# Patient Record
Sex: Female | Born: 1970 | Race: White | Hispanic: No | Marital: Married | Smoking: Former smoker
Health system: Southern US, Community
[De-identification: ages and names within clinical notes are randomized; demographics above are authoritative.]

## PROBLEM LIST (undated history)

## (undated) DIAGNOSIS — I639 Cerebral infarction, unspecified: Secondary | ICD-10-CM

## (undated) DIAGNOSIS — IMO0001 Reserved for inherently not codable concepts without codable children: Secondary | ICD-10-CM

## (undated) DIAGNOSIS — Z8669 Personal history of other diseases of the nervous system and sense organs: Secondary | ICD-10-CM

## (undated) HISTORY — PX: ENDOMETRIAL ABLATION: SHX621

## (undated) HISTORY — DX: Cerebral infarction, unspecified: I63.9

## (undated) HISTORY — PX: SHOULDER SURGERY: SHX246

## (undated) HISTORY — DX: Reserved for inherently not codable concepts without codable children: IMO0001

---

## 1999-09-18 ENCOUNTER — Ambulatory Visit (HOSPITAL_COMMUNITY): Admission: RE | Admit: 1999-09-18 | Discharge: 1999-09-18 | Payer: Self-pay | Admitting: *Deleted

## 1999-11-25 ENCOUNTER — Emergency Department (HOSPITAL_COMMUNITY): Admission: EM | Admit: 1999-11-25 | Discharge: 1999-11-25 | Payer: Self-pay | Admitting: Emergency Medicine

## 2000-11-05 ENCOUNTER — Emergency Department (HOSPITAL_COMMUNITY): Admission: EM | Admit: 2000-11-05 | Discharge: 2000-11-05 | Payer: Self-pay | Admitting: Emergency Medicine

## 2000-11-11 ENCOUNTER — Encounter: Admission: RE | Admit: 2000-11-11 | Discharge: 2000-11-30 | Payer: Self-pay | Admitting: Family Medicine

## 2000-12-26 ENCOUNTER — Other Ambulatory Visit: Admission: RE | Admit: 2000-12-26 | Discharge: 2000-12-26 | Payer: Self-pay | Admitting: Obstetrics and Gynecology

## 2002-11-29 ENCOUNTER — Other Ambulatory Visit: Admission: RE | Admit: 2002-11-29 | Discharge: 2002-11-29 | Payer: Self-pay | Admitting: Obstetrics and Gynecology

## 2003-01-09 ENCOUNTER — Encounter: Payer: Self-pay | Admitting: Obstetrics and Gynecology

## 2003-01-09 ENCOUNTER — Ambulatory Visit (HOSPITAL_COMMUNITY): Admission: RE | Admit: 2003-01-09 | Discharge: 2003-01-09 | Payer: Self-pay | Admitting: Obstetrics and Gynecology

## 2003-02-06 ENCOUNTER — Encounter: Payer: Self-pay | Admitting: Obstetrics and Gynecology

## 2003-02-06 ENCOUNTER — Ambulatory Visit (HOSPITAL_COMMUNITY): Admission: RE | Admit: 2003-02-06 | Discharge: 2003-02-06 | Payer: Self-pay | Admitting: Obstetrics and Gynecology

## 2003-03-04 ENCOUNTER — Inpatient Hospital Stay (HOSPITAL_COMMUNITY): Admission: AD | Admit: 2003-03-04 | Discharge: 2003-03-04 | Payer: Self-pay | Admitting: Obstetrics and Gynecology

## 2003-03-19 ENCOUNTER — Inpatient Hospital Stay (HOSPITAL_COMMUNITY): Admission: AD | Admit: 2003-03-19 | Discharge: 2003-03-19 | Payer: Self-pay | Admitting: Obstetrics and Gynecology

## 2003-05-26 ENCOUNTER — Encounter (INDEPENDENT_AMBULATORY_CARE_PROVIDER_SITE_OTHER): Payer: Self-pay | Admitting: Specialist

## 2003-05-26 ENCOUNTER — Inpatient Hospital Stay (HOSPITAL_COMMUNITY): Admission: AD | Admit: 2003-05-26 | Discharge: 2003-05-29 | Payer: Self-pay | Admitting: Obstetrics and Gynecology

## 2003-05-30 ENCOUNTER — Encounter: Admission: RE | Admit: 2003-05-30 | Discharge: 2003-06-29 | Payer: Self-pay | Admitting: Obstetrics and Gynecology

## 2003-09-13 ENCOUNTER — Encounter: Admission: RE | Admit: 2003-09-13 | Discharge: 2003-09-13 | Payer: Self-pay | Admitting: Otolaryngology

## 2003-09-13 IMAGING — US US SOFT TISSUE HEAD/NECK
1 series · 14 of 25 positions shown · non-contrast
Comparison: none

CLINICAL DATA: Enlarged gland on physical exam. 
ULTRASOUND OF THE THYROID
The right lobe measures 6.2 x 2.3 x 2.3 cm.  The left lobe is 6.2 x 2.1 x 2.0 cm.  The isthmus is 0.9 cm in thickness.

[Series 1: unknown · 0.08mm/px · 14 of 33 slices shown]
[im 1/33]
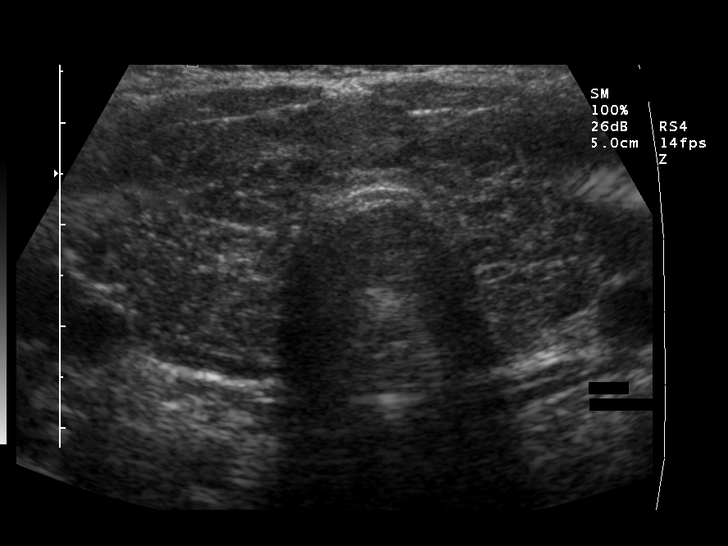
[im 3/33]
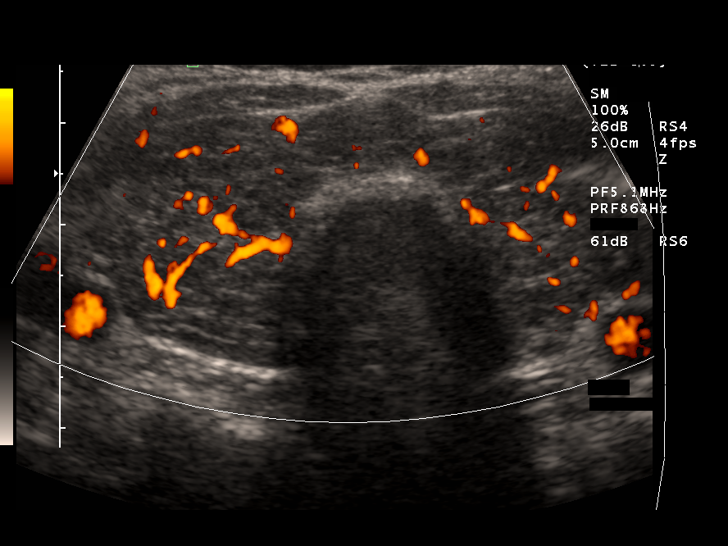
[im 6/33]
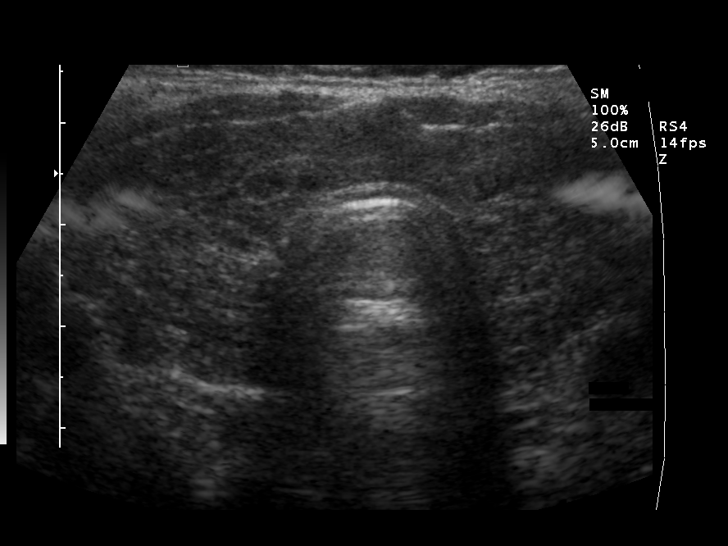
[im 9/33]
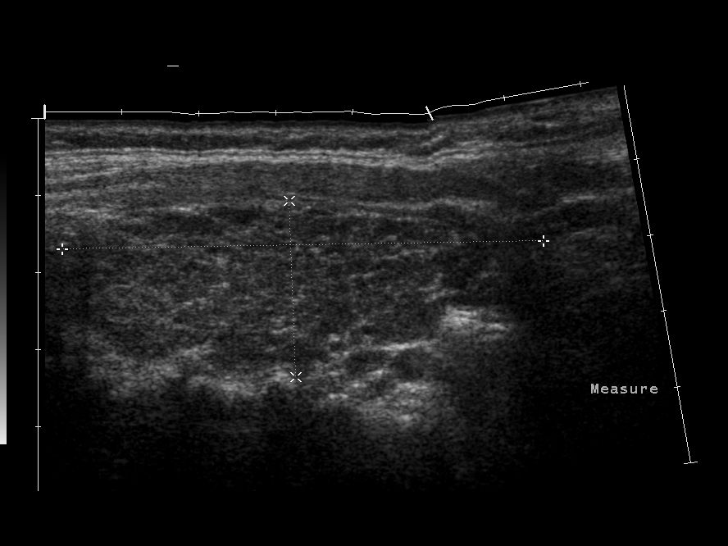
[im 11/33]
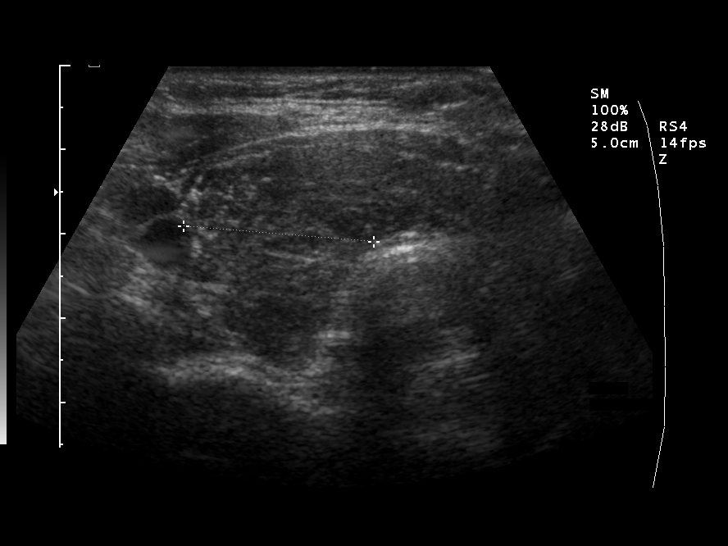
[im 13/33]
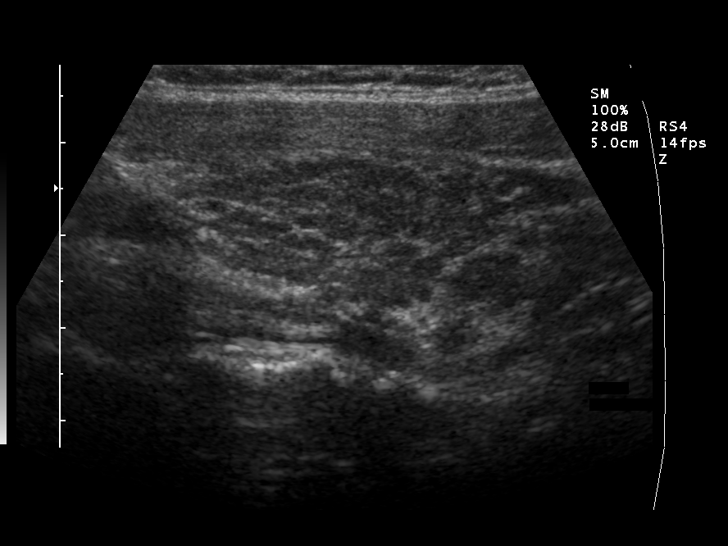
[im 15/33]
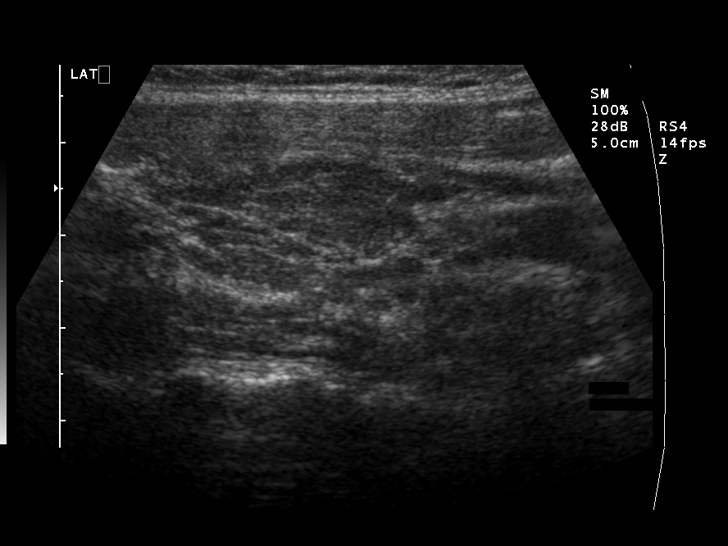
[im 18/33]
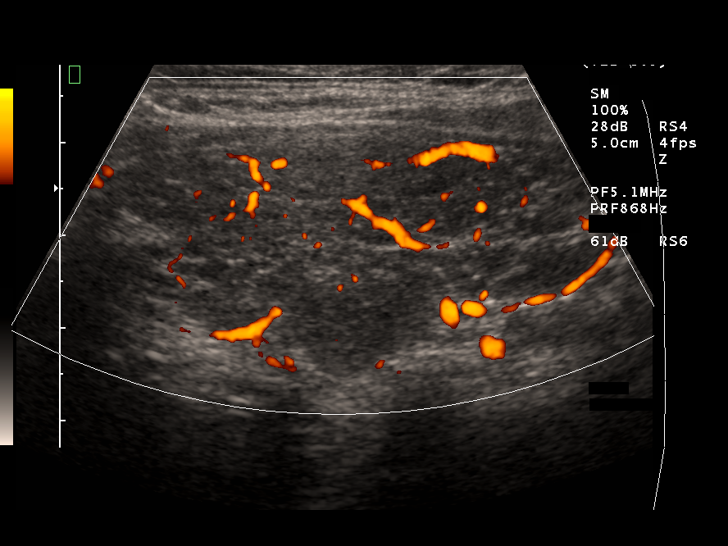
[im 21/33]
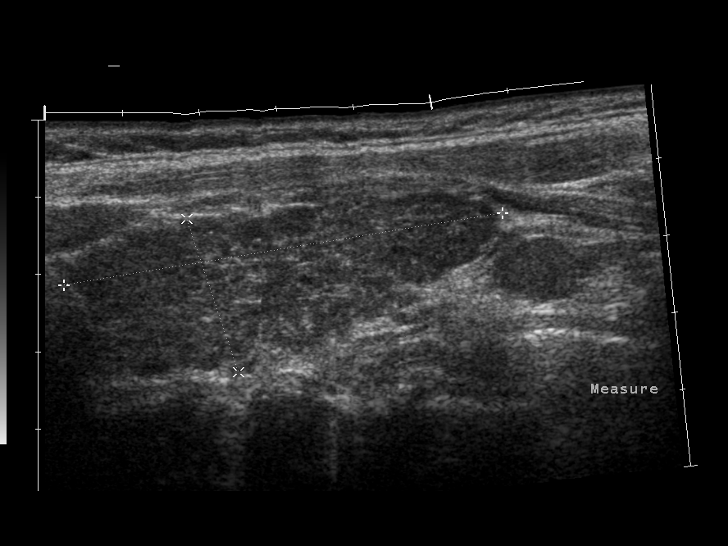
[im 22/33]
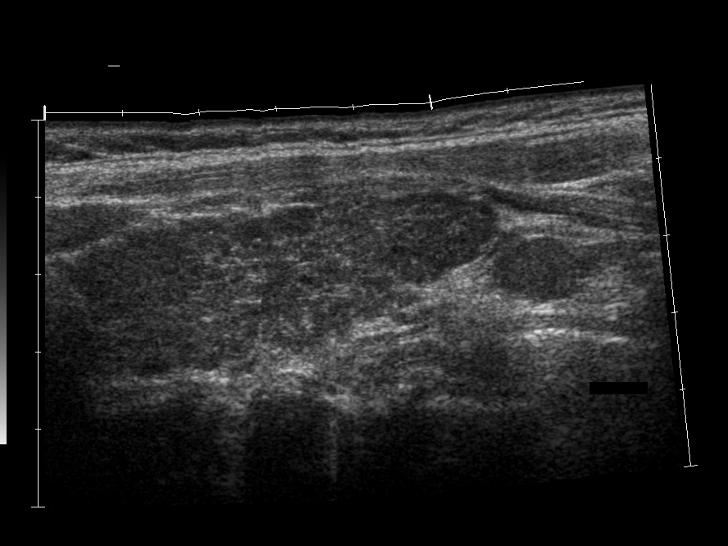
[im 25/33]
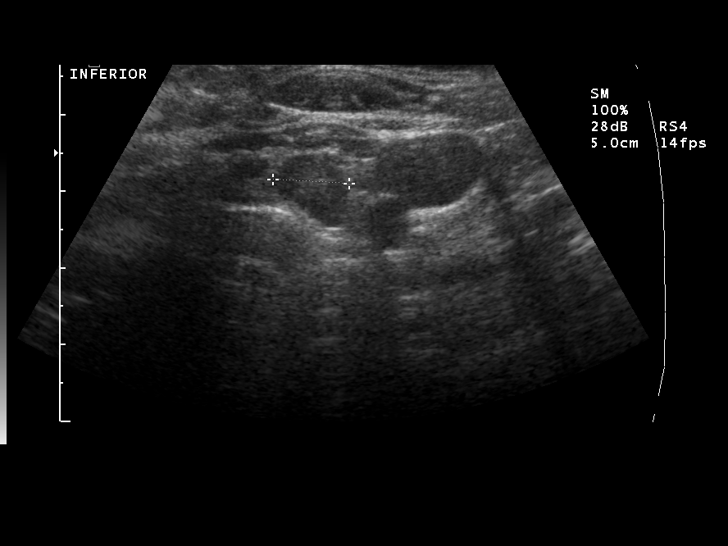
[im 27/33]
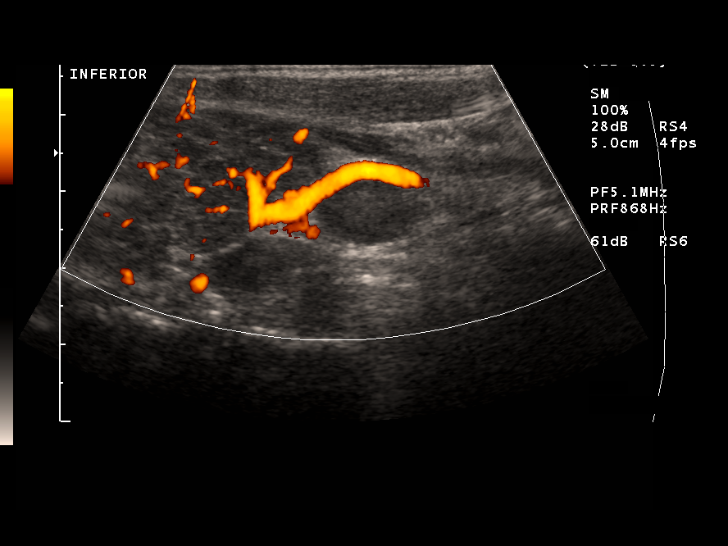
[im 30/33]
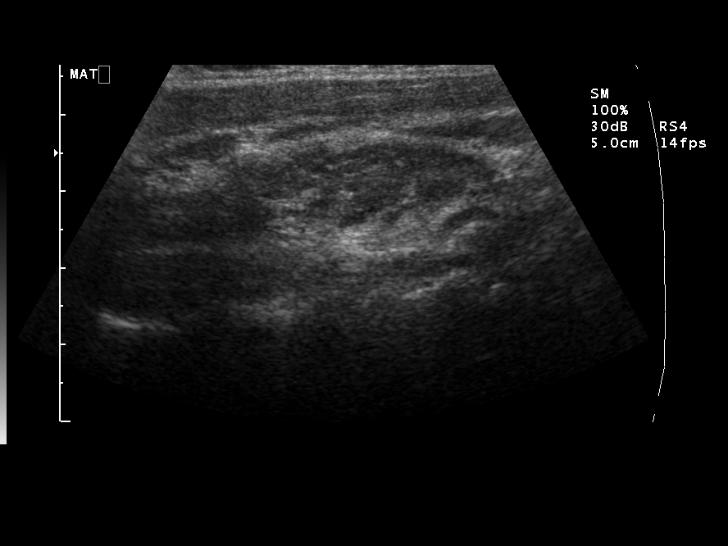
[im 33/33]
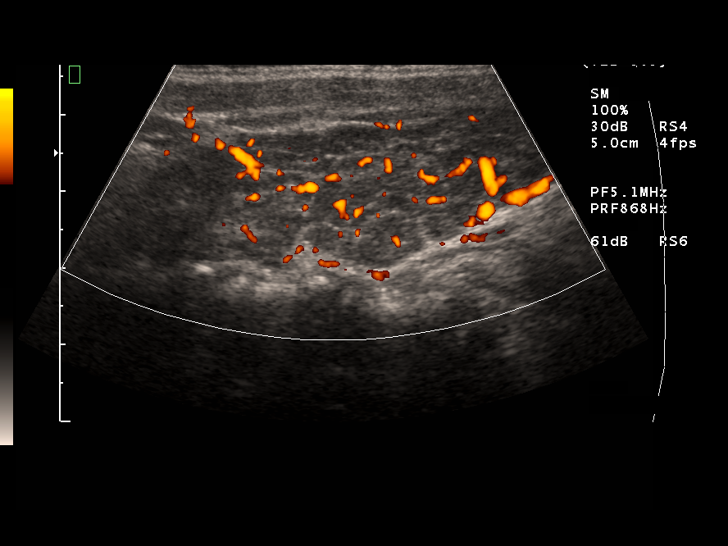

[14 of 25 positions shown; findings below may reference images not displayed]

The gland is diffusely inhomogeneous, but there are no discrete nodules or cysts.  

Just below the inferior aspect of the left lobe, there is an PIKIR shaped soft tissue density in the soft tissues measuring 14 x 8 x 10 mm.  This has the appearance of a lymph node.  It is borderline enlarged.  This should be correlated clinically.  One might consider a follow-up study in three to six months to make sure this is enlarging .  
IMPRESSION
1.  Moderately enlarged thyroid gland showing diffuse inhomogeneity, without discrete nodules or cysts.   
2.  Incidentally noted is a borderline enlarged lymph node in the left neck, just below the left lobe of the thyroid.  See report.

## 2004-07-09 ENCOUNTER — Other Ambulatory Visit: Admission: RE | Admit: 2004-07-09 | Discharge: 2004-07-09 | Payer: Self-pay | Admitting: Obstetrics and Gynecology

## 2004-07-16 ENCOUNTER — Ambulatory Visit: Payer: Self-pay | Admitting: Family Medicine

## 2004-07-21 ENCOUNTER — Ambulatory Visit (HOSPITAL_COMMUNITY): Admission: RE | Admit: 2004-07-21 | Discharge: 2004-07-21 | Payer: Self-pay | Admitting: Obstetrics and Gynecology

## 2004-07-21 IMAGING — US US TRANSVAGINAL NON-OB
1 series · 14 of 25 positions shown · non-contrast
Comparison: none

CLINICAL DATA: The patient has right lower quadrant pain.
 ULTRASOUND OF THE PELVIS  - TRANSABDOMINAL AND TRANSVAGINAL:
 The transabdominal study reveals the uterus to measure 6.8 x 4.0 x 5.5 cm.  The uterus is thought to be slightly retroverted.  
 The transvaginal study reveals the endometrial stripe to measure 6.6 mm which is thought to be within normal limits.  
 The right ovary measures 3.7 x 2.1 x 2.6 cm with numerous follicles, the largest of which measures 1.1 x 0.93 x 0.94 cm.  The left ovary measures 2.5 x 1.4 x 1.7 cm again with numerous follicles with the largest measuring up to 0.72 x 0.69 x 0.79 cm.  No free fluid.

[Series 1: unknown · 0.33mm/px · 14 of 54 slices shown]
[im 1/54]
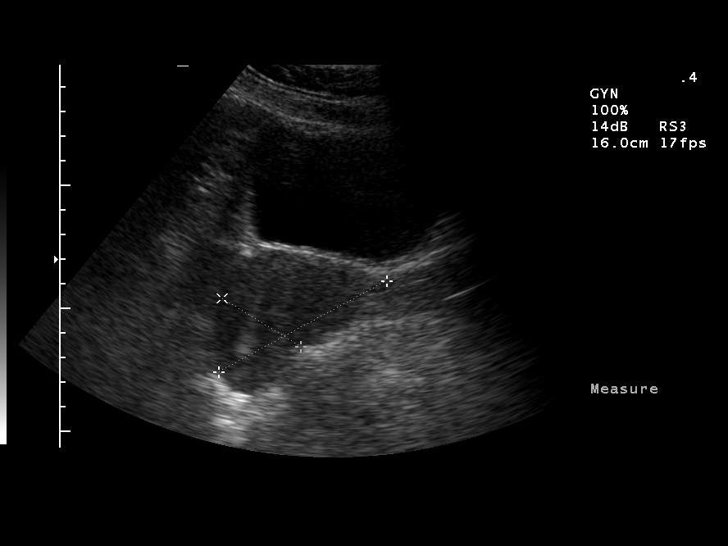
[im 5/54]
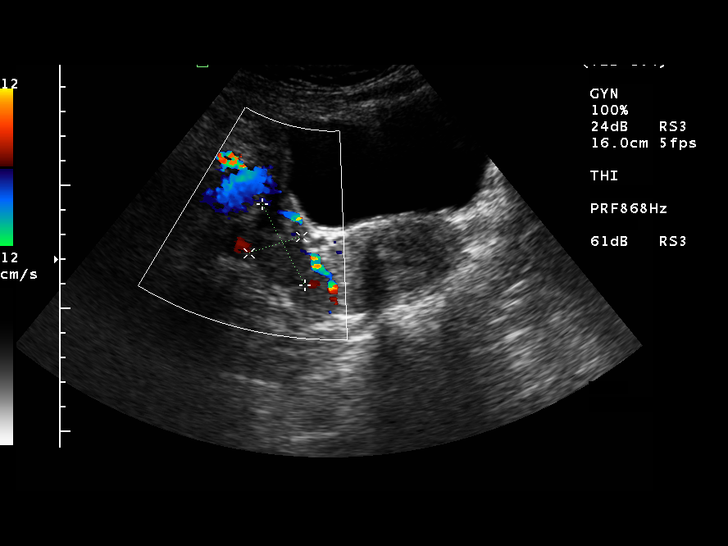
[im 9/54]
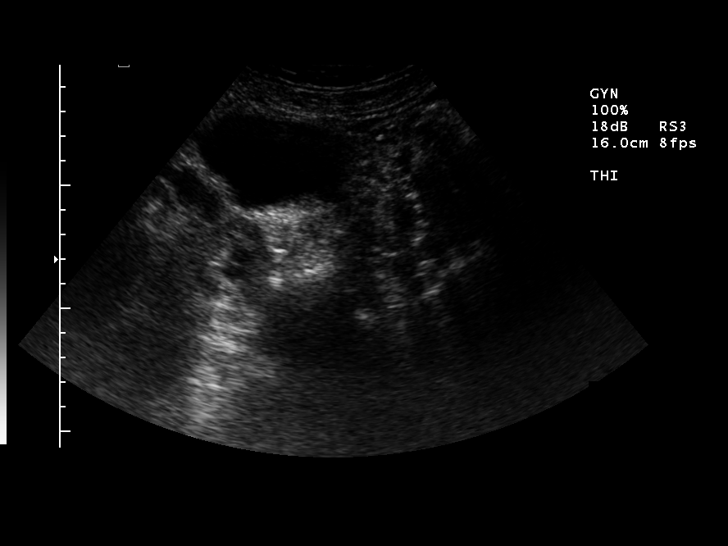
[im 14/54]
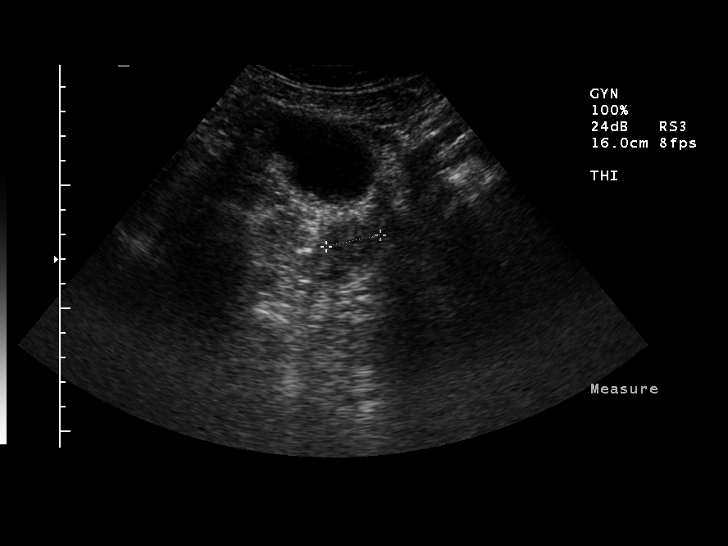
[im 18/54]
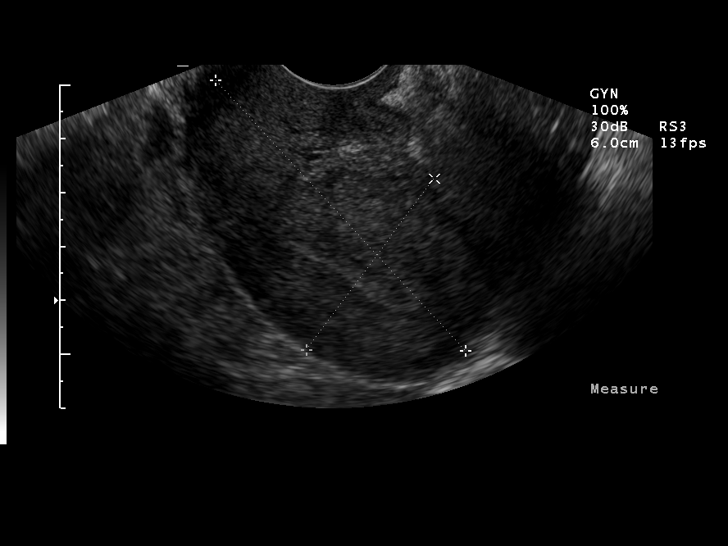
[im 20/54]
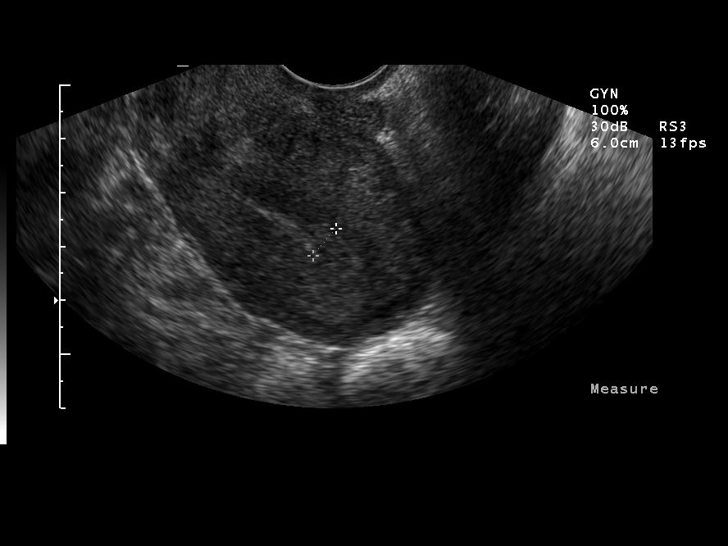
[im 25/54]
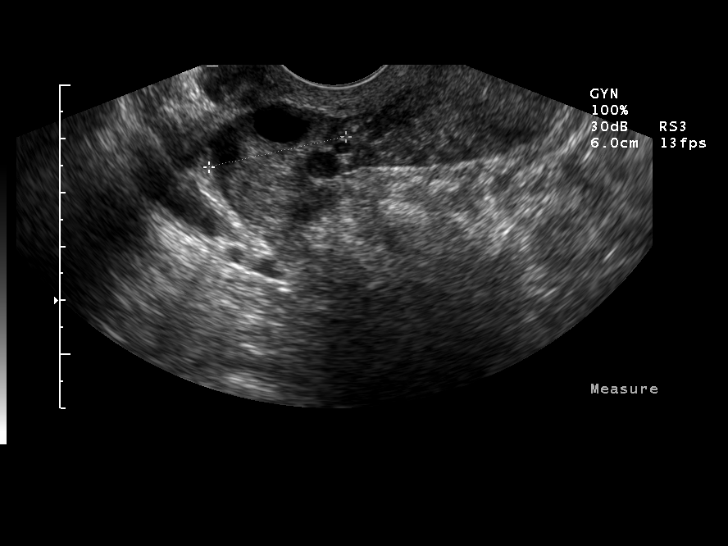
[im 29/54]
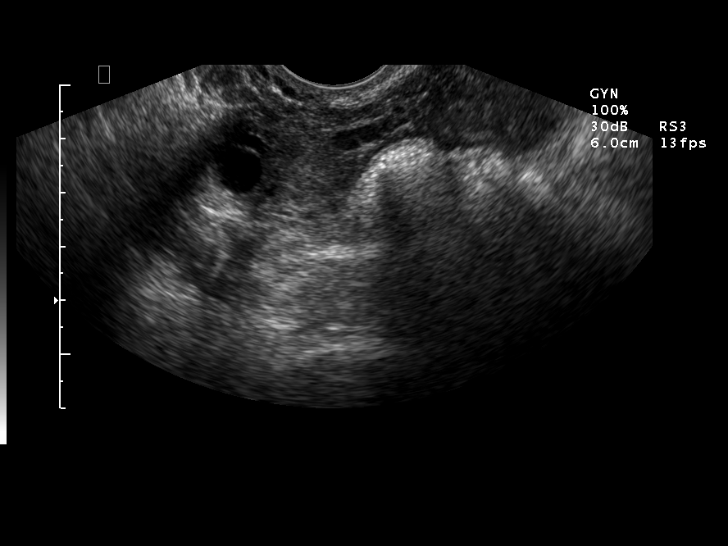
[im 34/54]
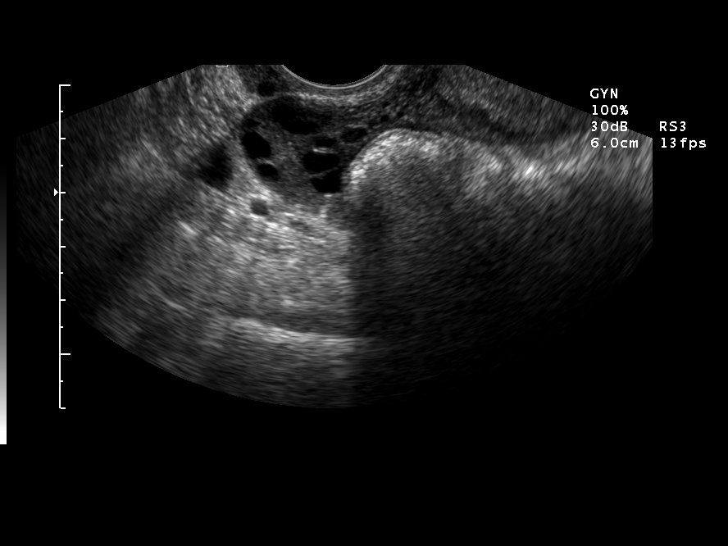
[im 36/54]
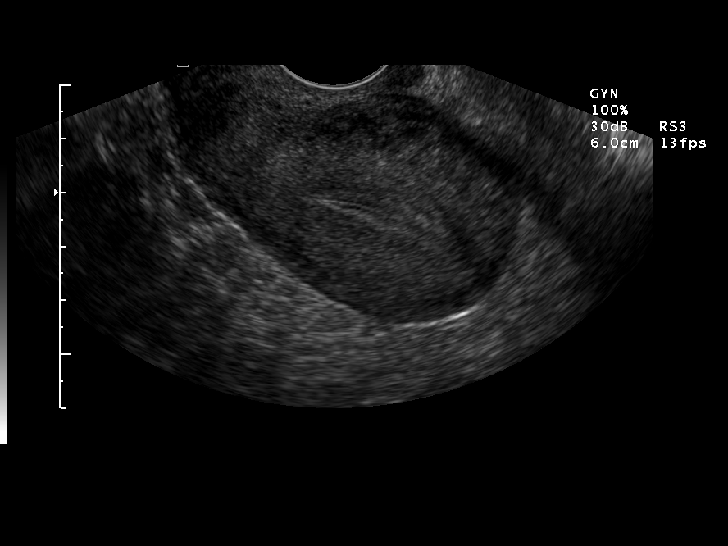
[im 40/54]
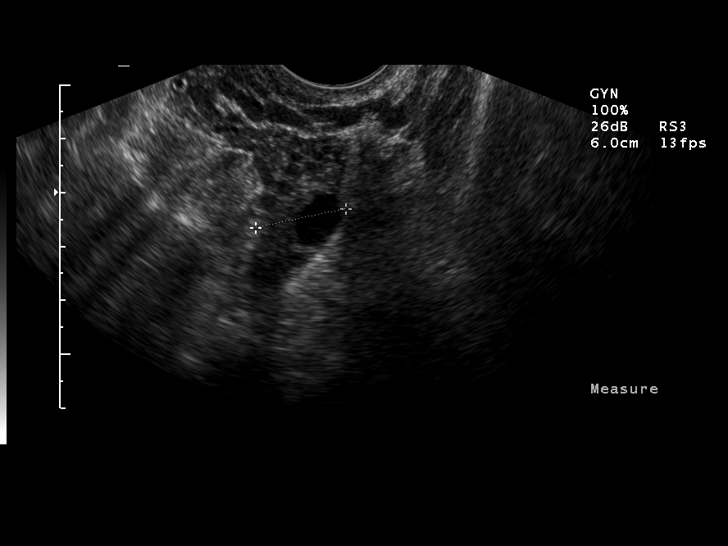
[im 45/54]
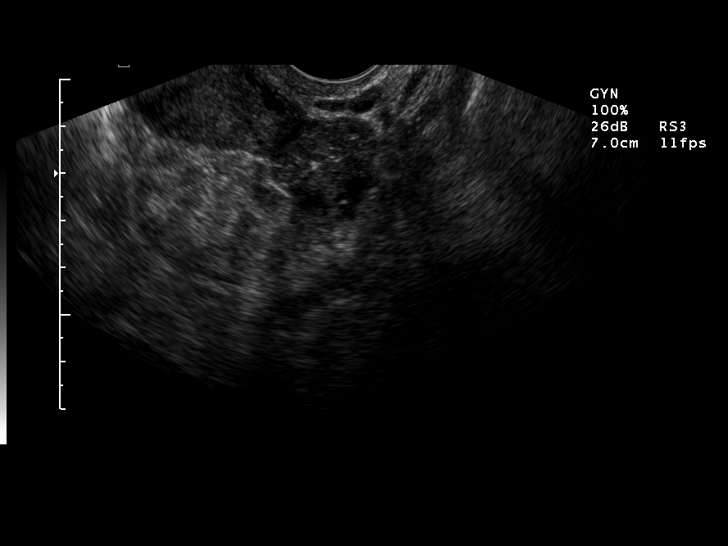
[im 49/54]
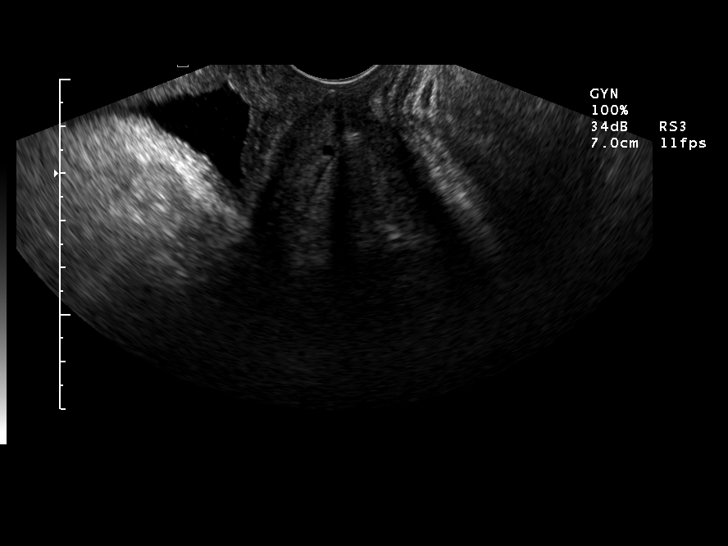
[im 54/54]
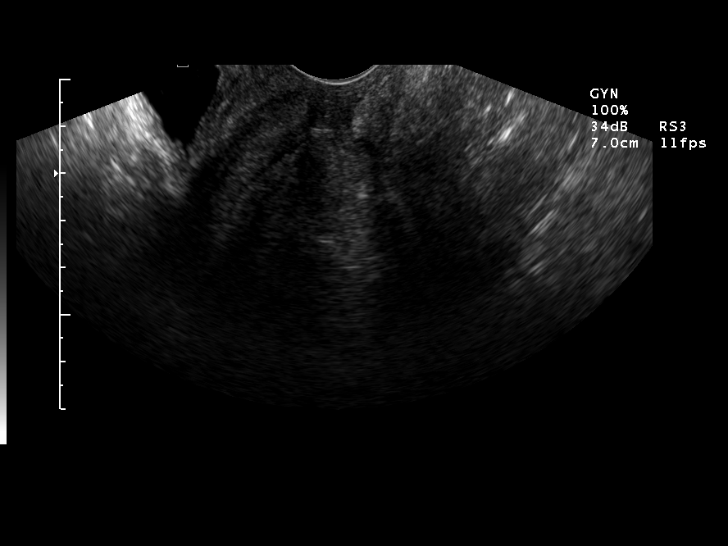

[14 of 25 positions shown; findings below may reference images not displayed]

IMPRESSION: The uterus is mildly retroverted.  
 Bilateral ovarian follicles with a cyst in the right ovary measuring up to 1.1 cm and in the left ovary measuring up to 0.8 cm.

## 2005-03-17 ENCOUNTER — Encounter: Admission: RE | Admit: 2005-03-17 | Discharge: 2005-03-17 | Payer: Self-pay | Admitting: Endocrinology

## 2005-03-17 IMAGING — US US SOFT TISSUE HEAD/NECK
1 series · 14 of 25 positions shown · non-contrast
Comparison: [DATE].

CLINICAL DATA: Hyperthyroidism.
 THYROID ULTRASOUND:
TECHNIQUE: Ultrasound examination of the thyroid gland and adjacent soft tissue structures was performed.

[Series 1: unknown · 0.08mm/px · 14 of 38 slices shown]
[im 1/38]
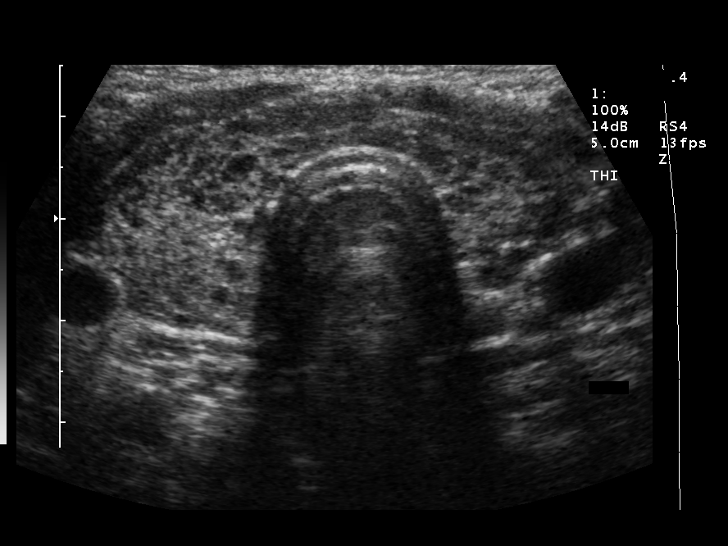
[im 4/38]
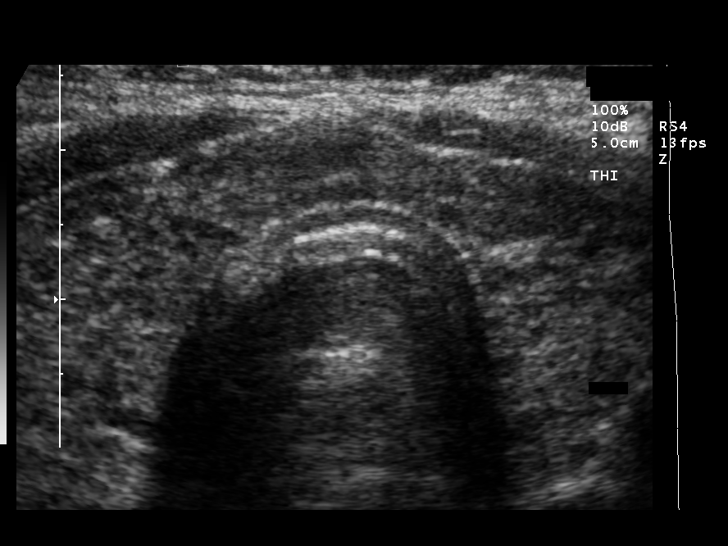
[im 7/38]
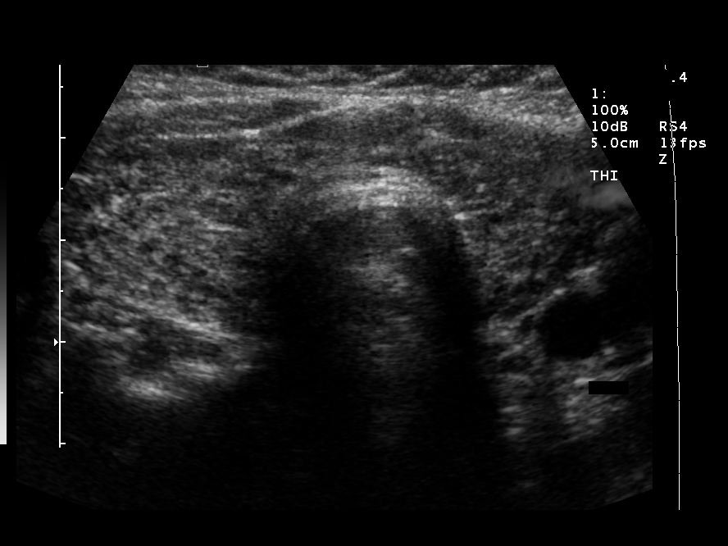
[im 10/38]
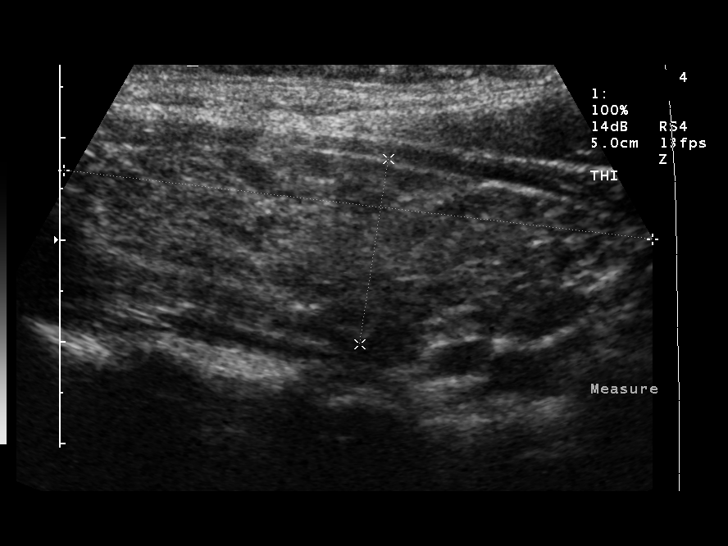
[im 13/38]
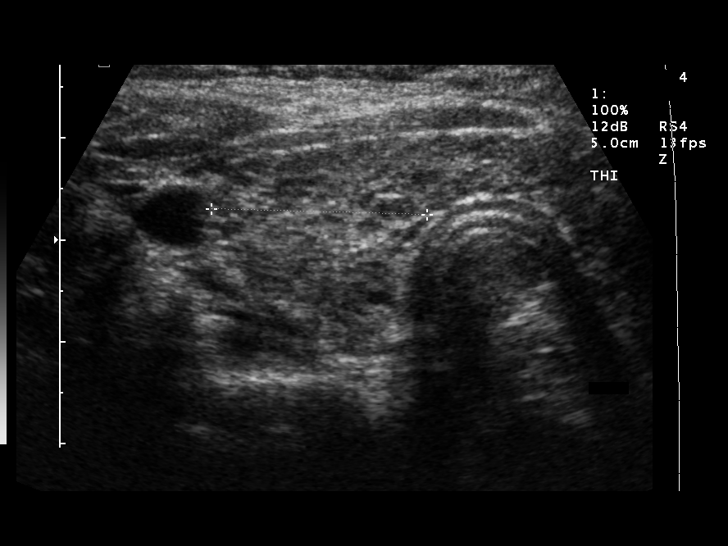
[im 14/38]
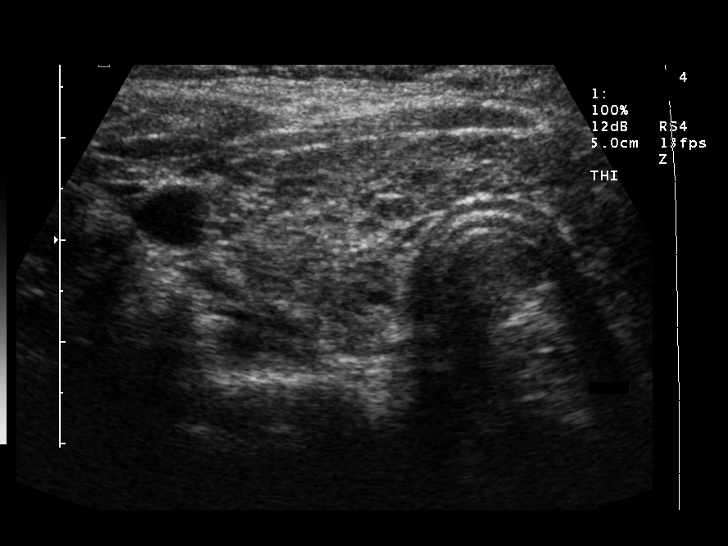
[im 17/38]
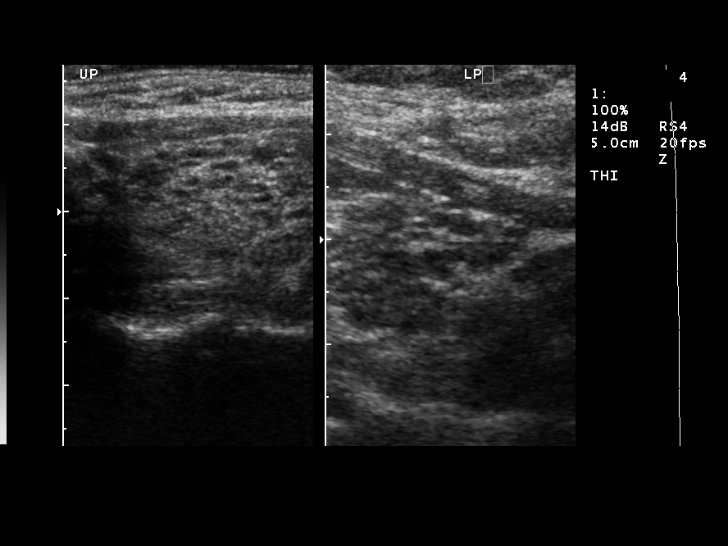
[im 21/38]
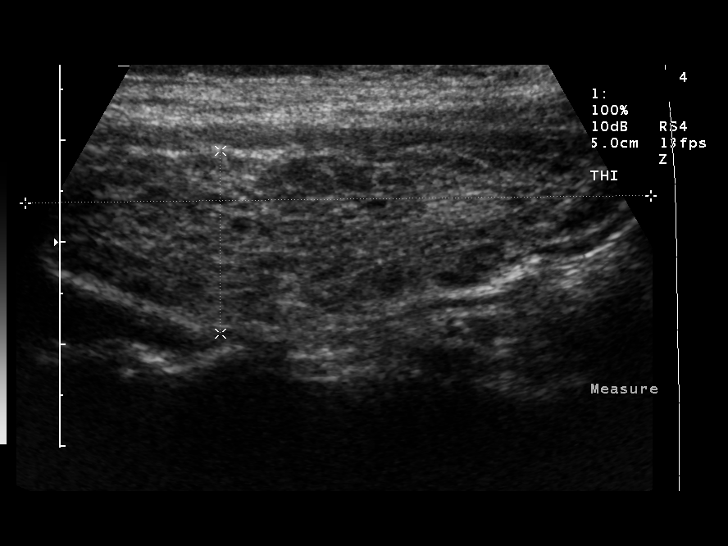
[im 24/38]
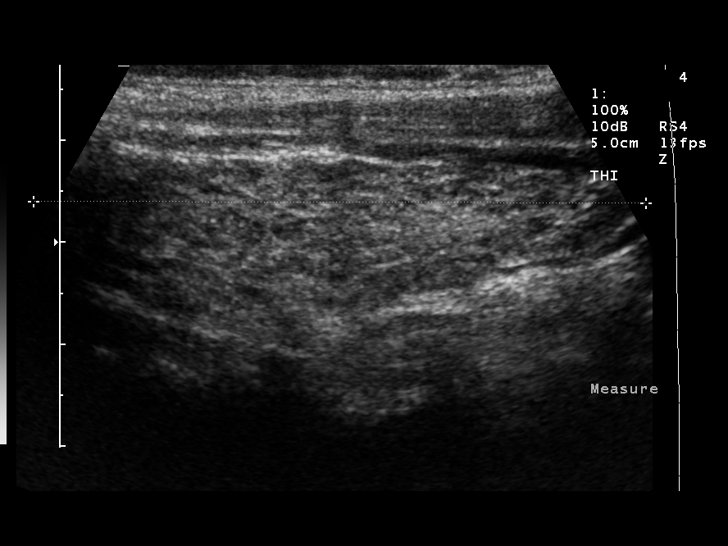
[im 25/38]
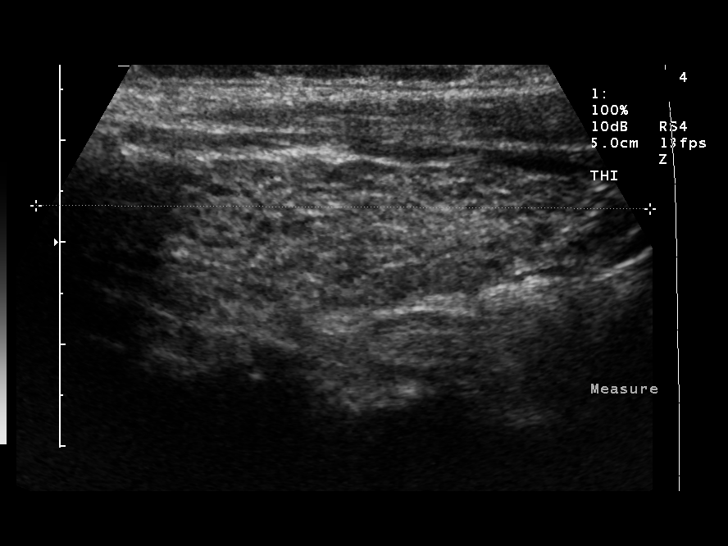
[im 28/38]
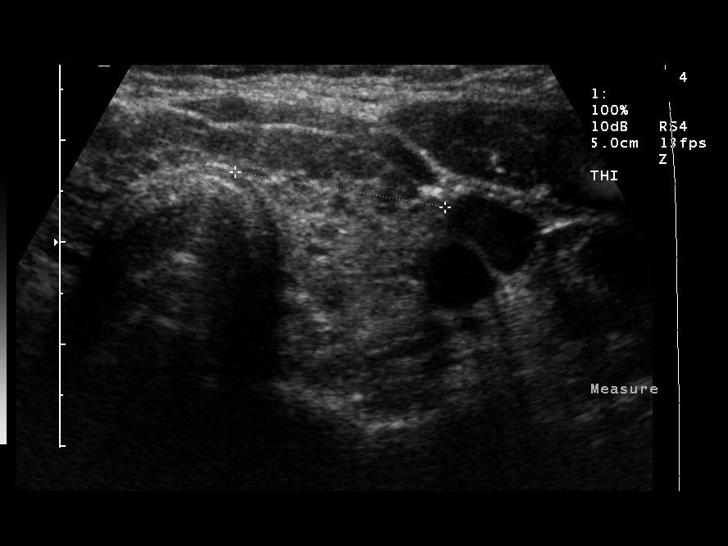
[im 31/38]
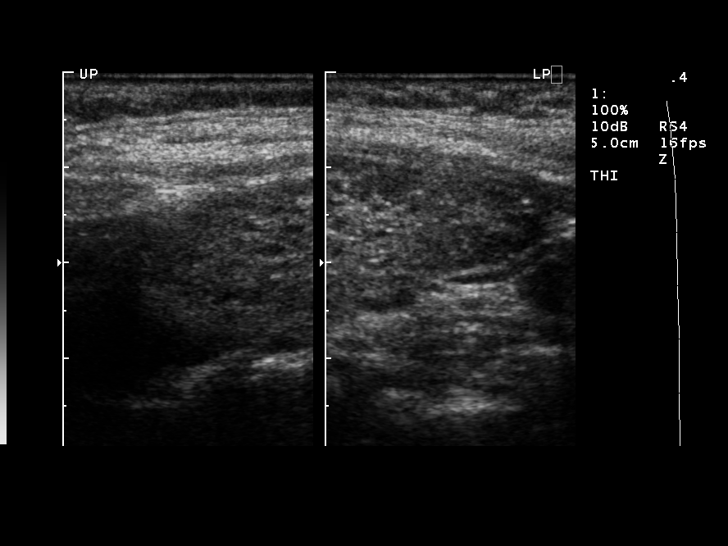
[im 34/38]
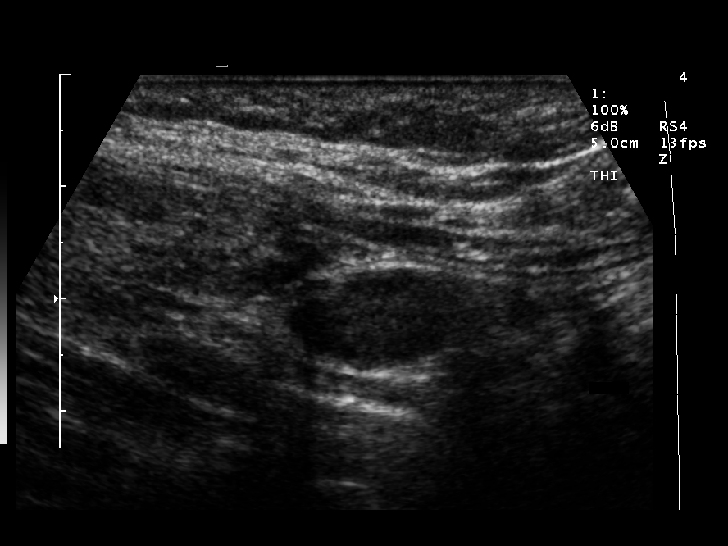
[im 38/38]
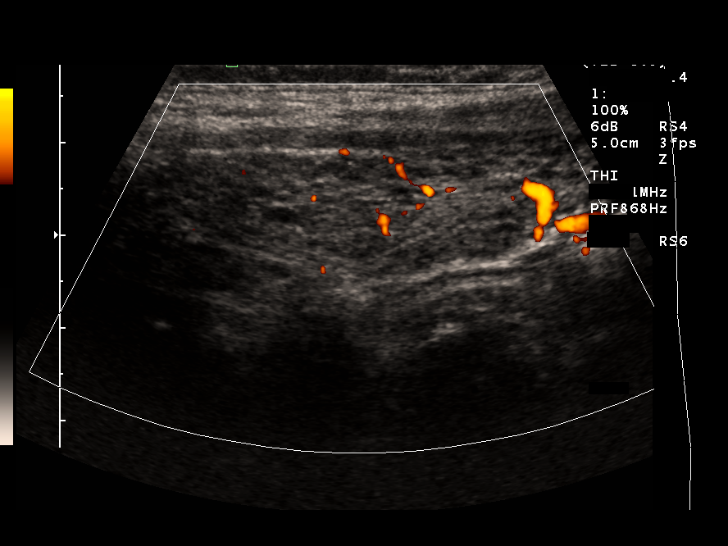

[14 of 25 positions shown; findings below may reference images not displayed]

FINDINGS: Right lobe at 5.8 x 1.8 x 2.1 cm.  Left lobe at 5.9 x 1.8 x 2.0 cm.   Isthmus at 6 mm.  These measurements are similar to on the prior.  Vascularity is again increased.  Echogenicity is heterogeneous.  No focal dominant lesion is identified.  Again identified in the soft tissues inferior to the left lower lobe thyroid is a 1.6 x 0.9 x 0.8 cm hypoechoic, well circumscribed lesion.  This is adjacent to the carotid and jugular and is overall similar to on the prior exam.  This likely represents a lymph node.
IMPRESSION: 1.  Similar appearance of thyromegaly and heterogeneous thyroid echotexture.  No dominant lesion identified.
 2.  Similar appearance of mildly prominent left extrathyroidal soft tissue nodule, likely representing a lymph node.

## 2005-04-26 ENCOUNTER — Encounter: Admission: RE | Admit: 2005-04-26 | Discharge: 2005-04-26 | Payer: Self-pay | Admitting: Endocrinology

## 2005-04-26 IMAGING — US US SOFT TISSUE HEAD/NECK
1 series · 13 of 25 positions shown · non-contrast
Comparison: Previous study of [DATE], [REDACTED].

CLINICAL DATA: Followup thyroid and lymph nodes of the neck.  
THYROID ULTRASOUND:
TECHNIQUE: Ultrasound examination of the thyroid gland and adjacent soft tissue structures was performed.

[Series 1: unknown · 0.07mm/px · 13 of 51 slices shown]
[im 1/51]
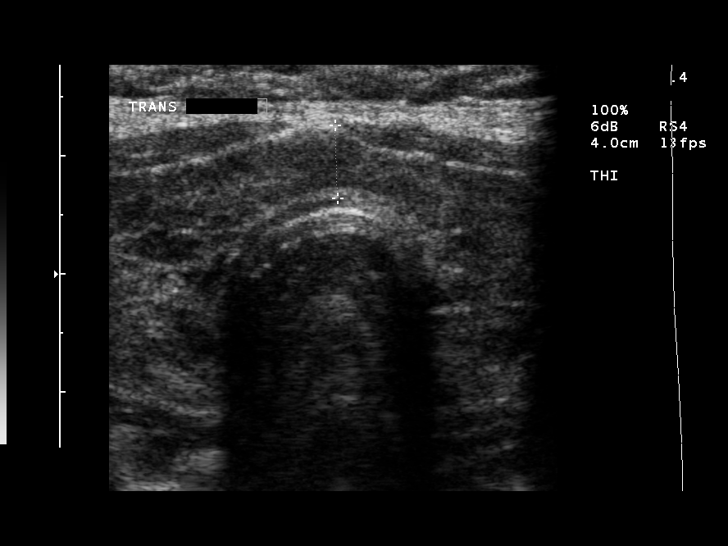
[im 5/51]
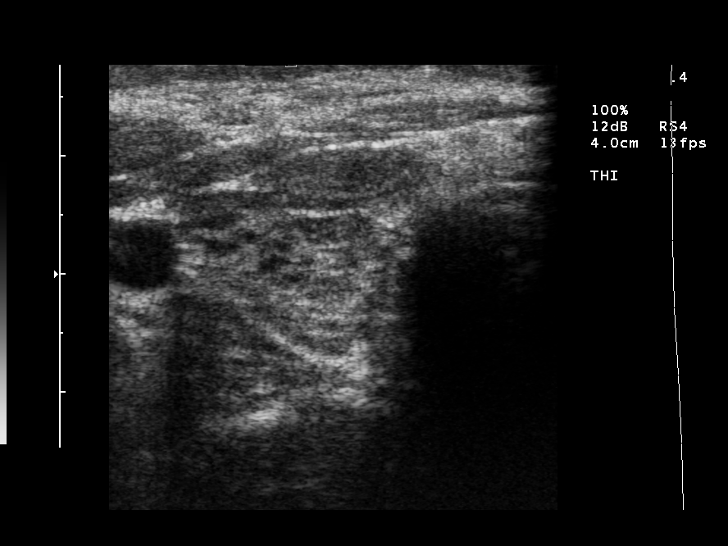
[im 9/51]
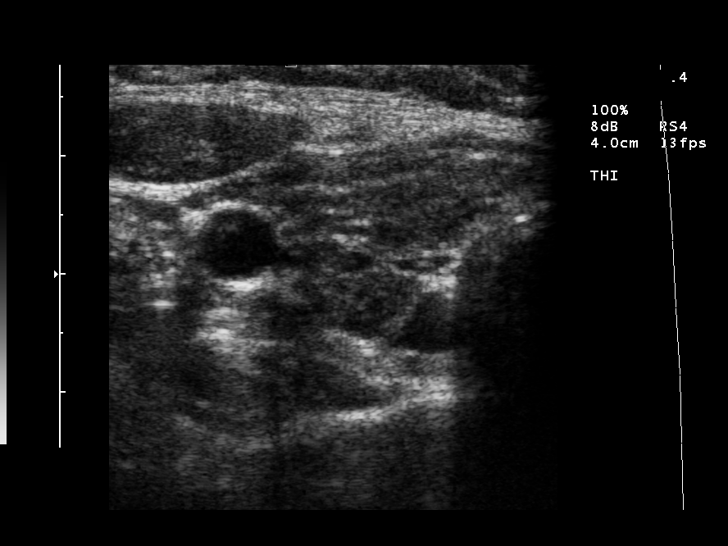
[im 13/51]
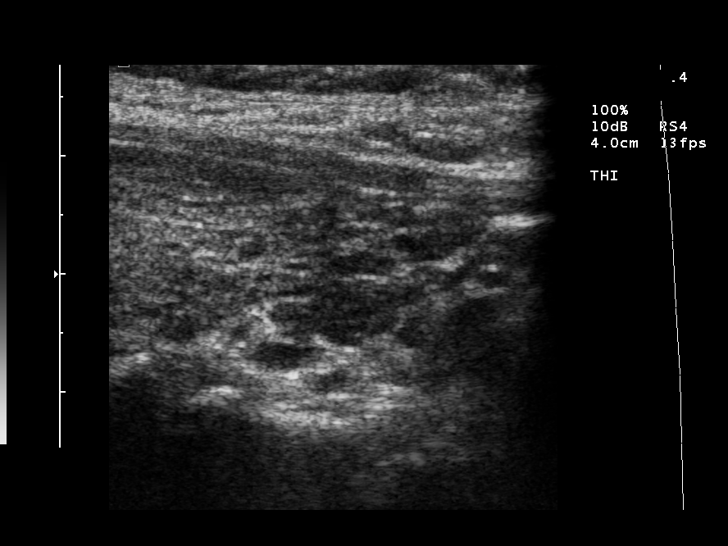
[im 17/51]
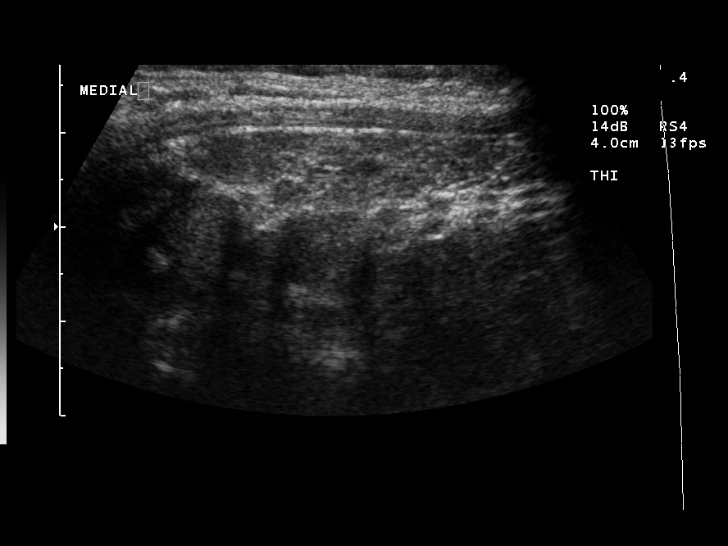
[im 21/51]
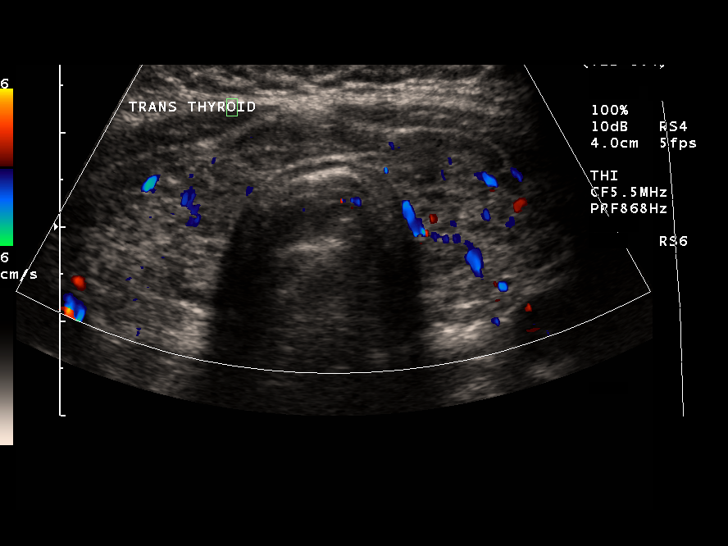
[im 26/51]
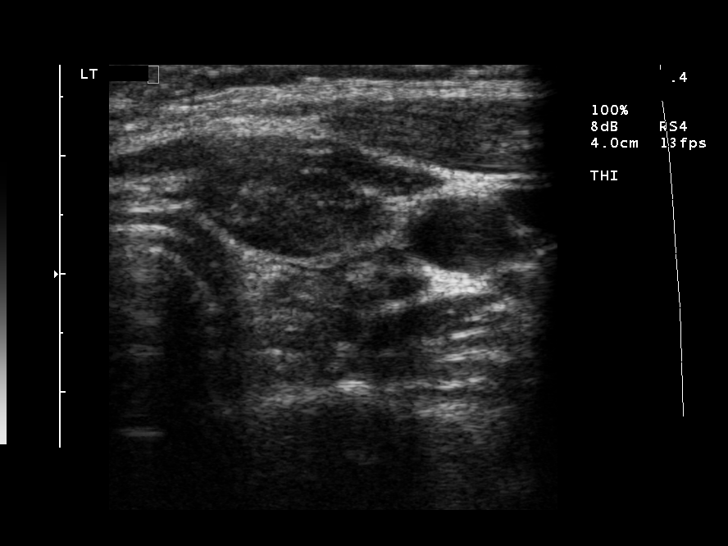
[im 30/51]
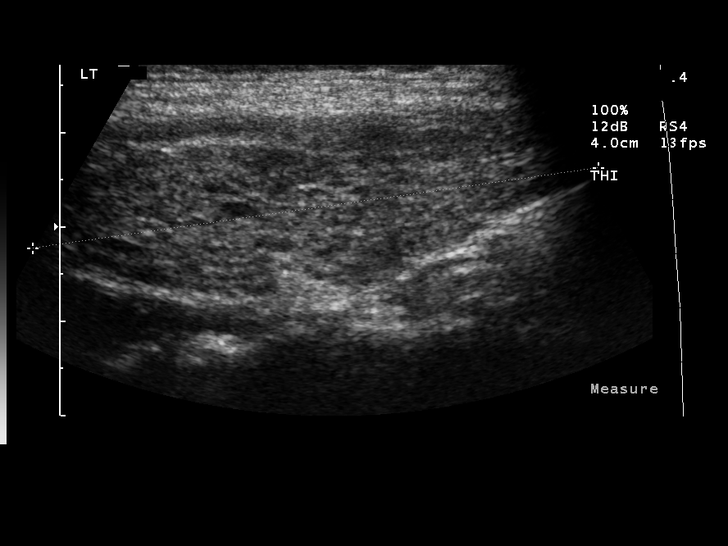
[im 34/51]
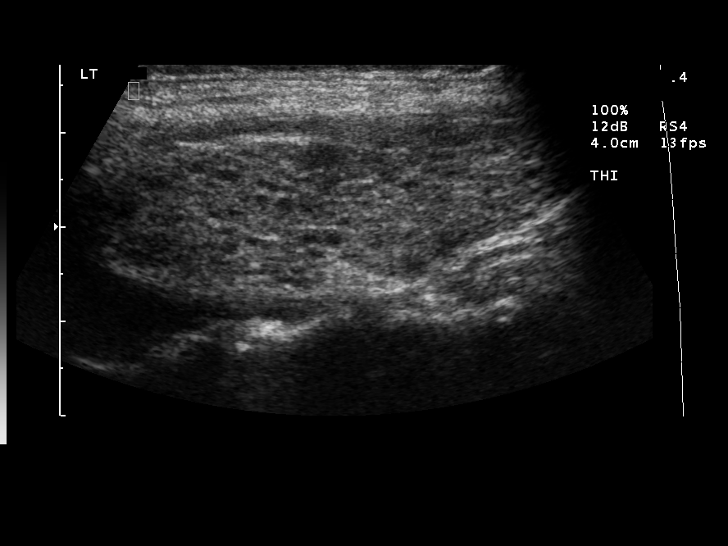
[im 38/51]
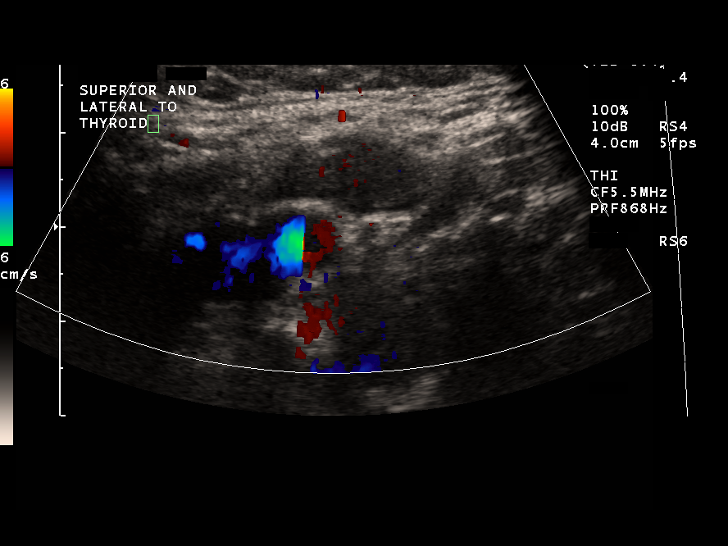
[im 42/51]
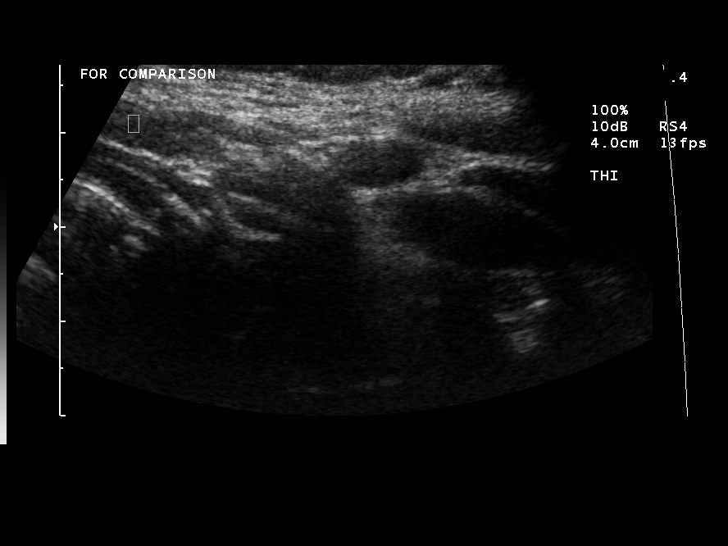
[im 46/51]
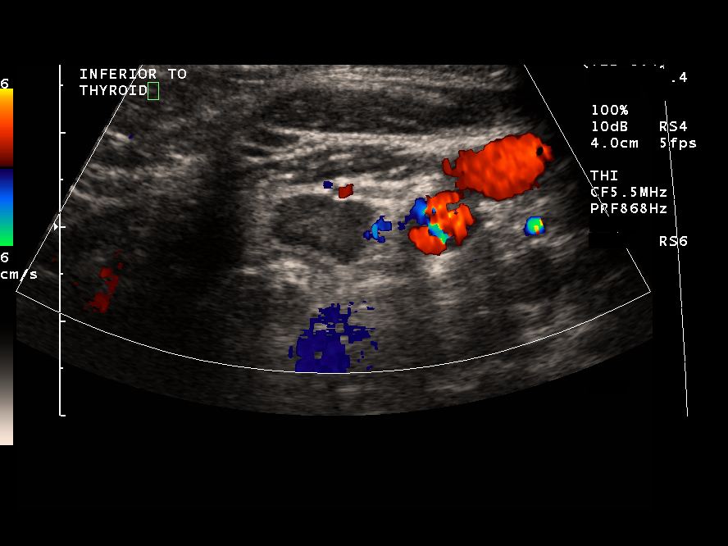
[im 51/51]
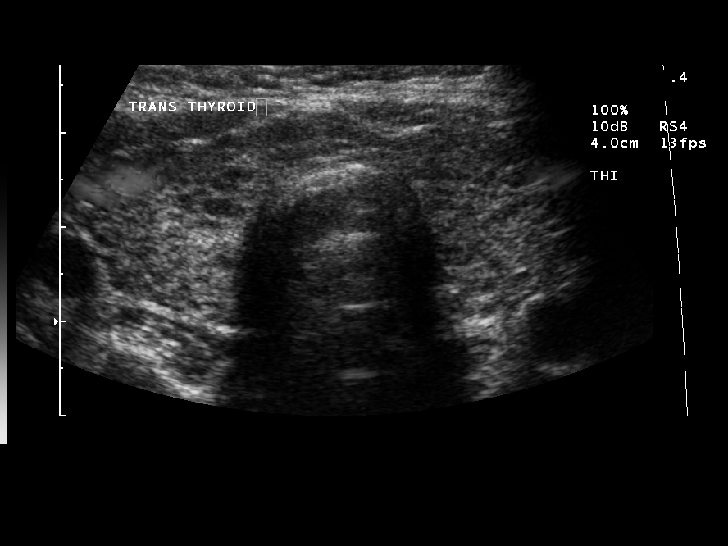

[13 of 25 positions shown; findings below may reference images not displayed]

FINDINGS: Multiple scans of the neck are made and are compared to the previous study and show the right lobe to be 5.8 x 1.5 x 2.3 cm.  The left lobe measures 6.0 x 1.7 x 2.1 cm.  Isthmus of the thyroid is 6 mm.  No focal cystic or solid masses are seen within the thyroid.  There has been no significant interval change in the thyroid lobes since the previous study.  There are two lymph nodes inferior to the thyroid.   The one on the right measures 5 x 7 x 10 mm and the one on the left measures 8 x 9 x 13 mm.  There is another palpable area at the right neck superior and lateral to the thyroid which appears to be a lymph node and measures 7 x 19 x 2 mm and a fourth superior to the thyroid of the left neck which measures 5 x 9 x 15 mm which also appears to be a lymph node.  Largest previous node was in the inferior aspect of the left lower lobe and measures 8 x 9 x 16 mm.  The largest node now seen is  superior and lateral to the thyroid on the right side which measures 7 x 19 x 20 mm may have not been checked on the previous thyroid studies due to it's position.
IMPRESSION: The thyroid itself has not changed.  There are now visualized four lymph nodes as noted above.  The largest is superior and lateral to the right thyroid and measures 7 x 19 x 20 mm but this area may not have been imaged on the previous studies.  There are smaller nodes in a similar position in the left neck but measure only 5 x 9 x 15 mm and there are also nodes inferior to both right and left lobes of the thyroid that now measure 8 x 9 x 13 mm on the left and on the right 5 x 7 x 10 mm.  All of these nodes could be reactive in nature.

## 2005-04-28 ENCOUNTER — Other Ambulatory Visit: Admission: RE | Admit: 2005-04-28 | Discharge: 2005-04-28 | Payer: Self-pay | Admitting: Interventional Radiology

## 2005-04-28 ENCOUNTER — Encounter: Admission: RE | Admit: 2005-04-28 | Discharge: 2005-04-28 | Payer: Self-pay | Admitting: Endocrinology

## 2005-04-28 ENCOUNTER — Encounter (INDEPENDENT_AMBULATORY_CARE_PROVIDER_SITE_OTHER): Payer: Self-pay | Admitting: Specialist

## 2005-04-28 IMAGING — US US BIOPSY
1 series · 5 of 5 positions shown · non-contrast
Comparison: Prior ultrasound dated [DATE].

CLINICAL DATA: History of thyroiditis.  Ultrasound has been used to follow lymph nodes adjacent to the thyroid gland.  One of these has become more prominent lying superior and lateral to the upper right thyroid gland and the patient now presents for image guided biopsy. 
 ULTRASOUND GUIDE NEEDLE ASPIRATE BIOPSY OF SUPERFICIAL LYMPH NODE IN RIGHT NECK:

[Series 1: unknown · 0.07mm/px · 5 of 5 slices shown]
[im 1/5]
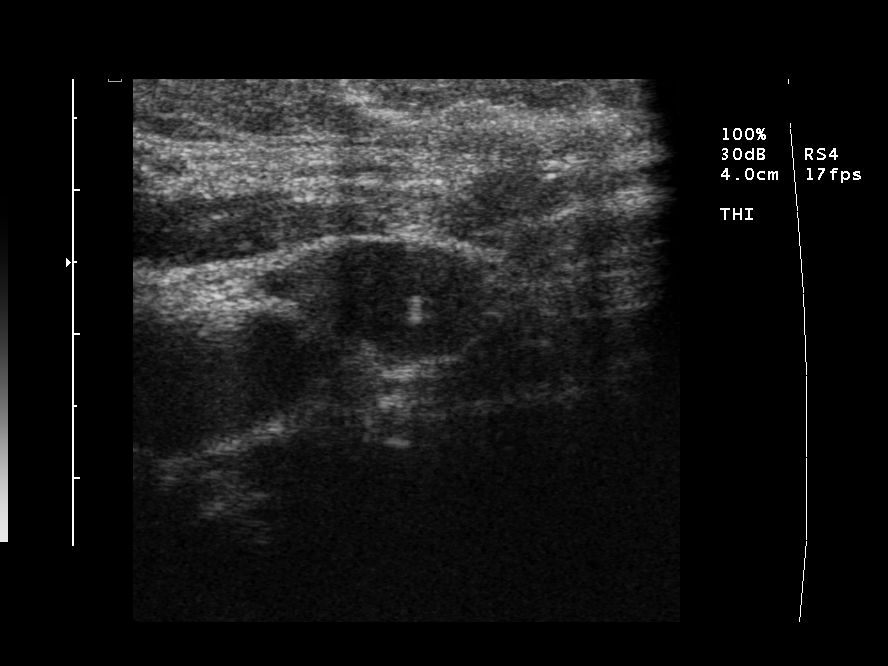
[im 2/5]
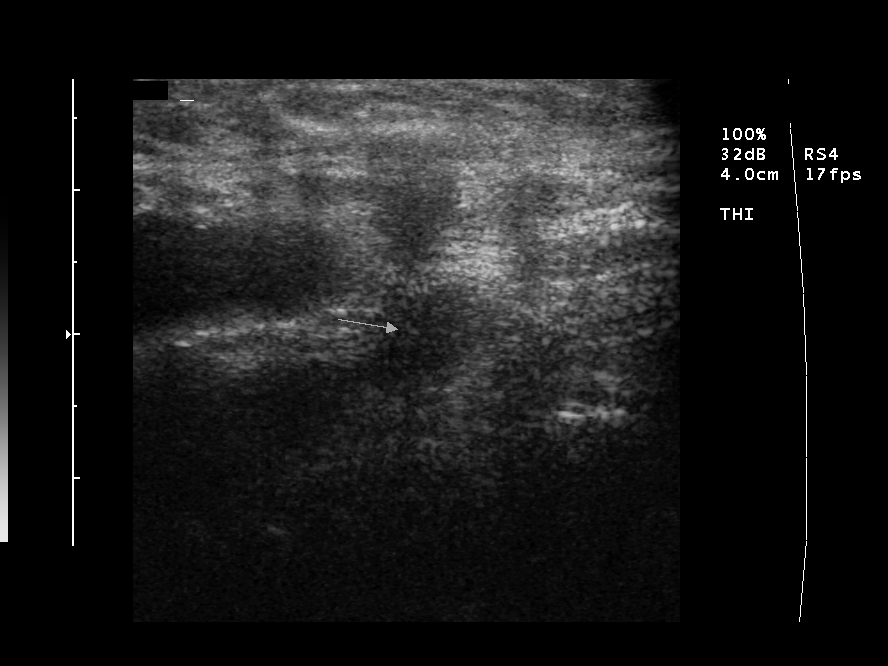
[im 3/5]
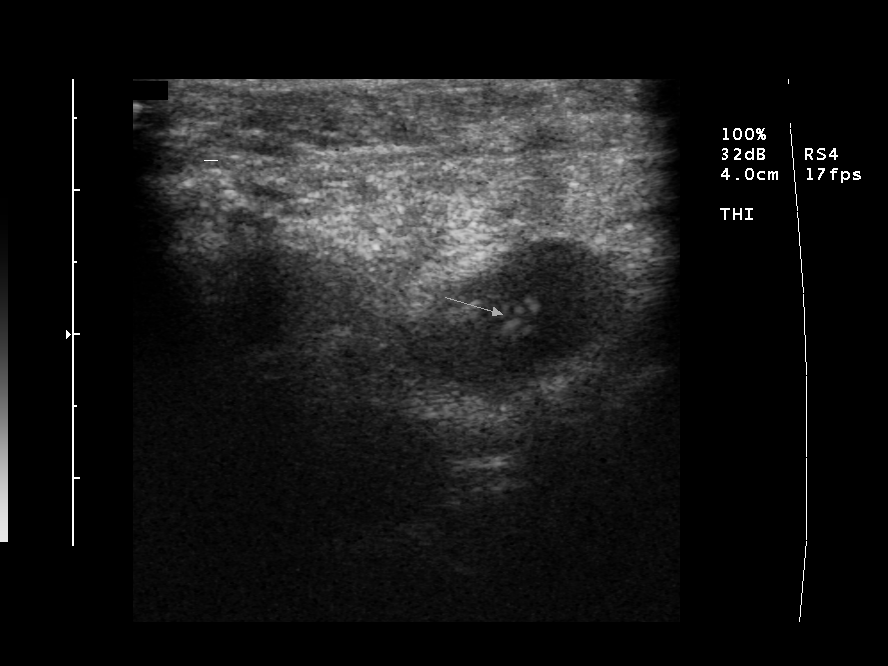
[im 4/5]
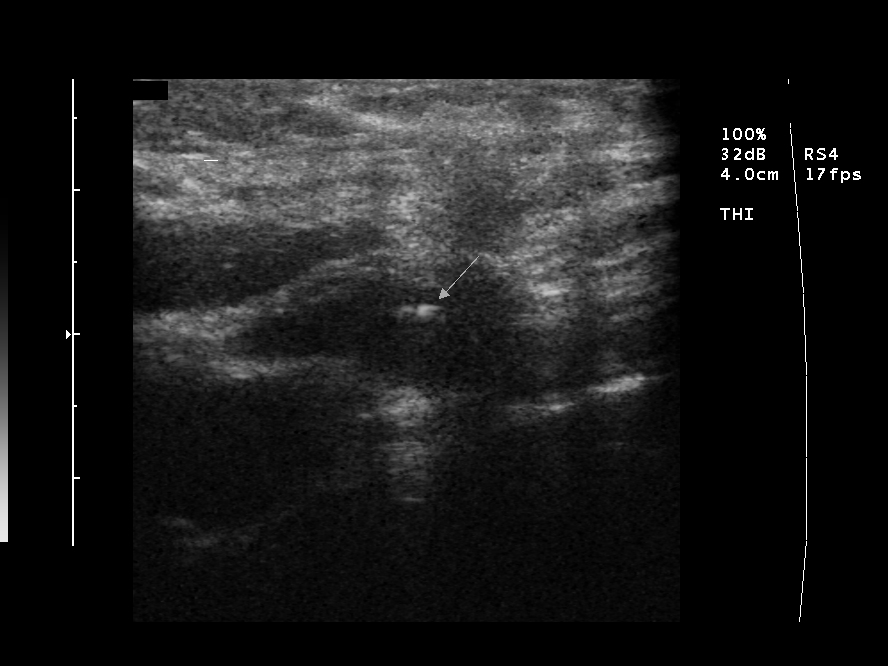
[im 5/5]
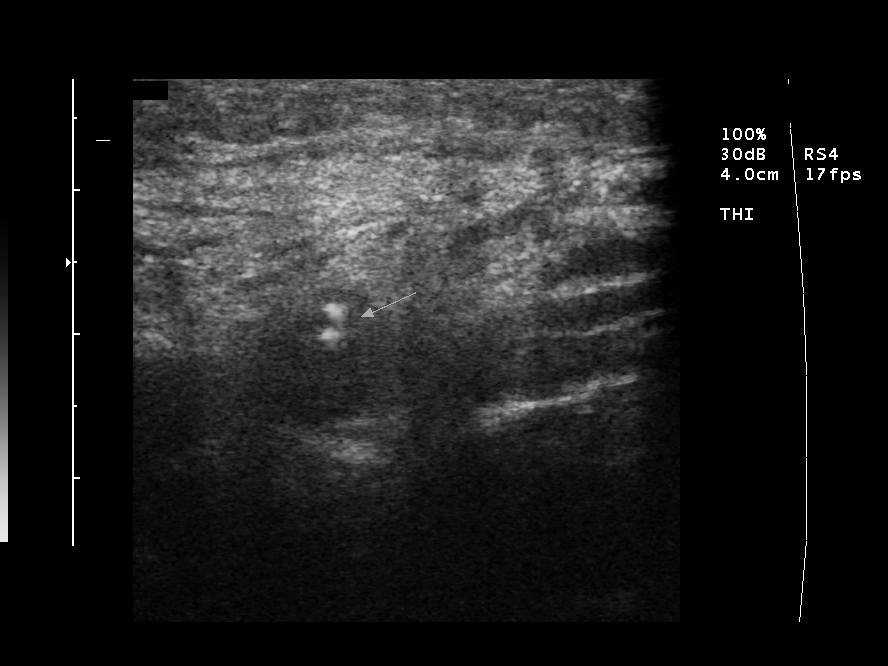

[5 of 5 positions shown; findings below may reference images not displayed]

Prior to the procedure, informed consent was obtained.  Imaging was used in localizing lymph node lying in the right superior neck just lateral and superior to the thyroid gland and superficial to the carotid artery.  Overlying skin was sterilely prepped and draped.  Local anesthesia was provided with 1% Lidocaine. 
 A single 25 gauge needle aspirate was performed within the lymph node.  This was followed by three additional 22 gauge aspirates with material also placed in solution for cell block.  
 Imaging reveals focal ovoid lymph node in the upper right neck.  This measures approximately 2cm in greatest diameter and lies immediately superficial to the common carotid artery.  Needle aspirates were successfully obtained.  Due to size and position core biopsy was not technically possible.
IMPRESSION: Ultrasound guided needle aspirate biopsy of right cervical lymph node located superior and lateral to the right lobe of the thyroid and immediately superficial to the common carotid artery.  Four needle aspirates were obtained.   Material was sent for cytology.

## 2005-07-08 ENCOUNTER — Encounter: Admission: RE | Admit: 2005-07-08 | Discharge: 2005-07-08 | Payer: Self-pay | Admitting: Endocrinology

## 2005-07-08 IMAGING — US US SOFT TISSUE HEAD/NECK
1 series · 13 of 25 positions shown · IV contrast (agent unspecified)
Comparison: none

CLINICAL DATA: Adenopathy. 
 ULTRASOUND SOFT TISSUE HEAD/NECK:
TECHNIQUE: Ultrasound examination of the soft tissues was performed in the area of clinical concern.
 Scans over the neck were performed and compared to the prior ultrasound of the thyroid of [DATE].  The thyroid gland has not changed in size.  The right lobe measures 5.8cm sagittally with a depth of 2.1cm and width of 2.5cm.  The left lobe measures 5.8 x 2.1 x 2.1cm.  The isthmus remains slightly prominent measuring 7mm and the gland is diffusely inhomogeneous.  No focal solid or cystic lesion is seen.  As noted on the prior study multiple lymph nodes are present in the region of the thyroid gland.  Some of these nodes appear larger than were described on the prior ultrasound and CT of the neck with IV contrast media is recommended to assess size of nodes and extent of adenopathy in more detail.  One of the larger nodes in the superior left lateral neck measures 3.1 x 0.6 x 0.9cm and one of the larger nodes in the superior right neck measures 2.3 x 0.9 x 1.9cm.  Multiple other nodes are present.

[Series 1: us soft tissue head/neck · 0.10mm/px · 13 of 60 slices shown]
[im 1/60]
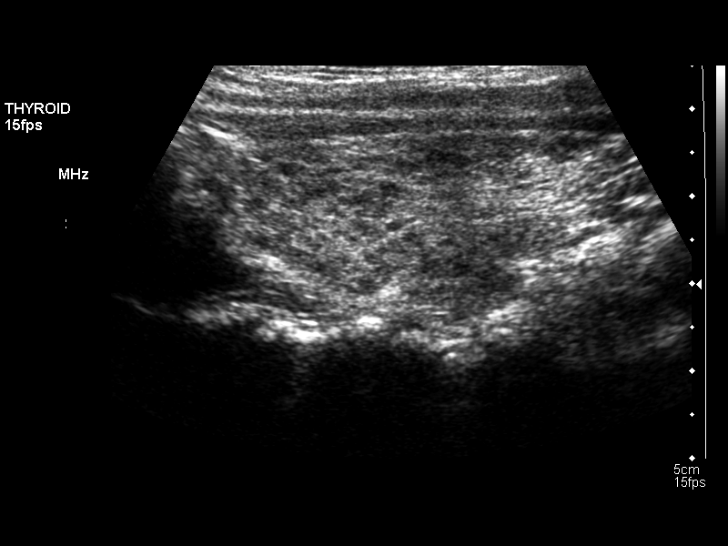
[im 5/60]
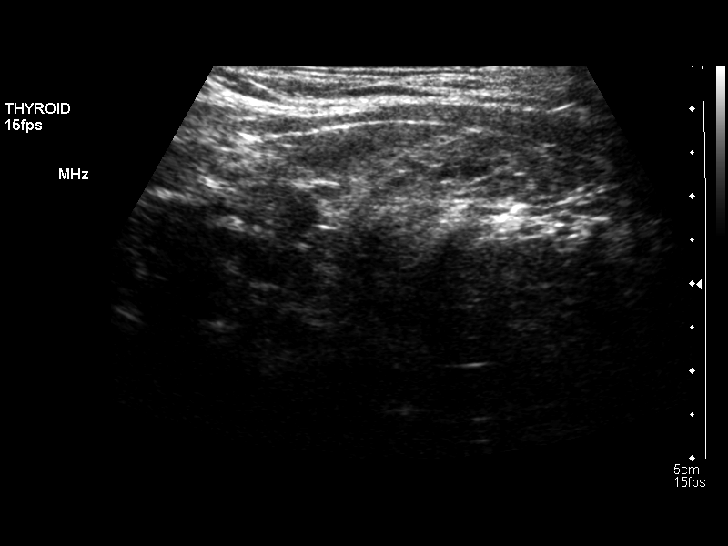
[im 10/60]
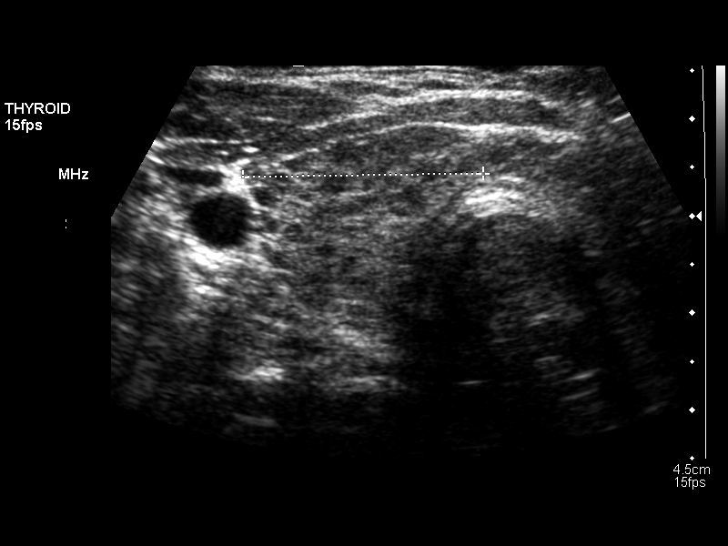
[im 15/60]
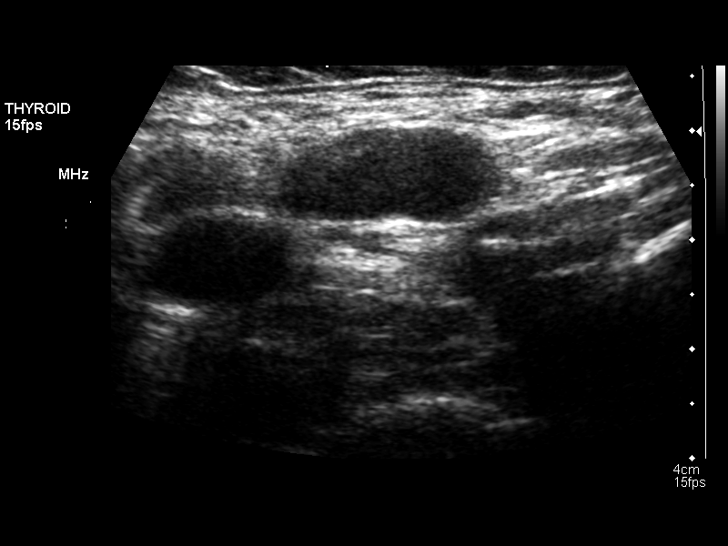
[im 20/60]
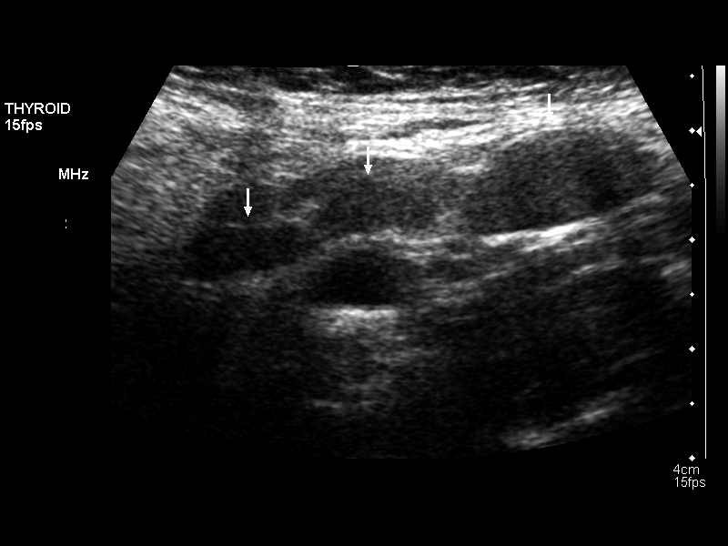
[im 25/60]
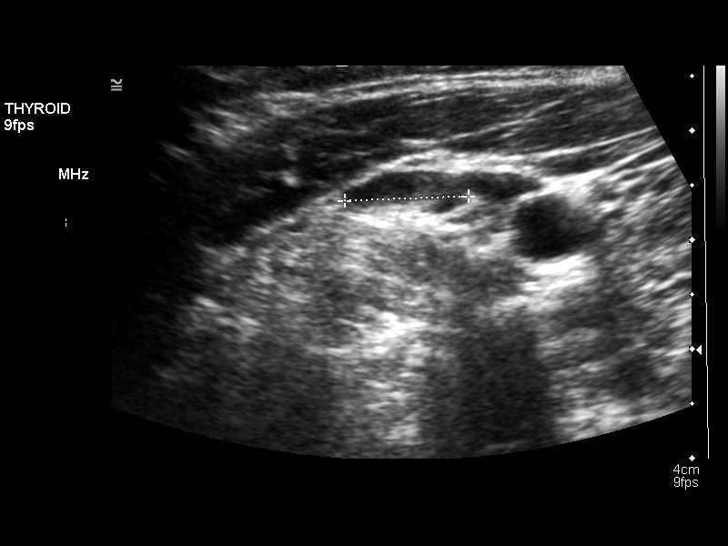
[im 30/60]
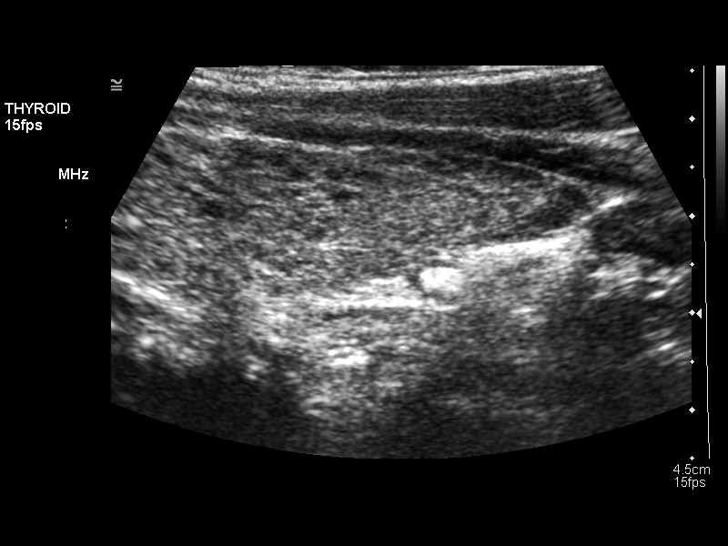
[im 35/60]
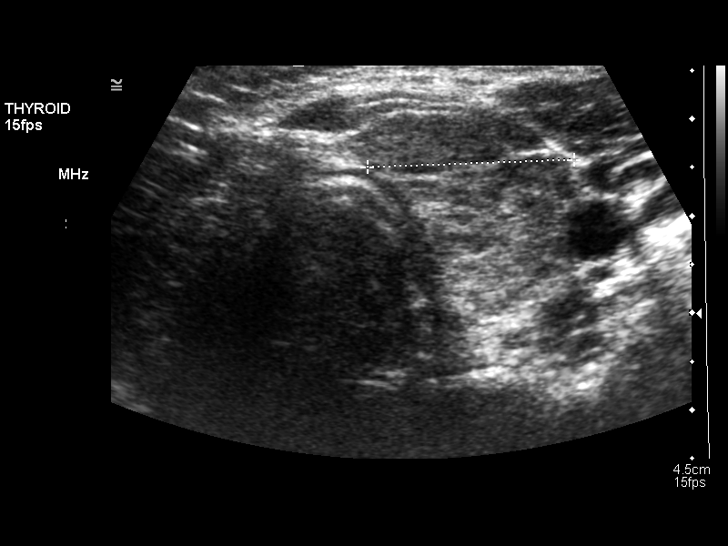
[im 40/60]
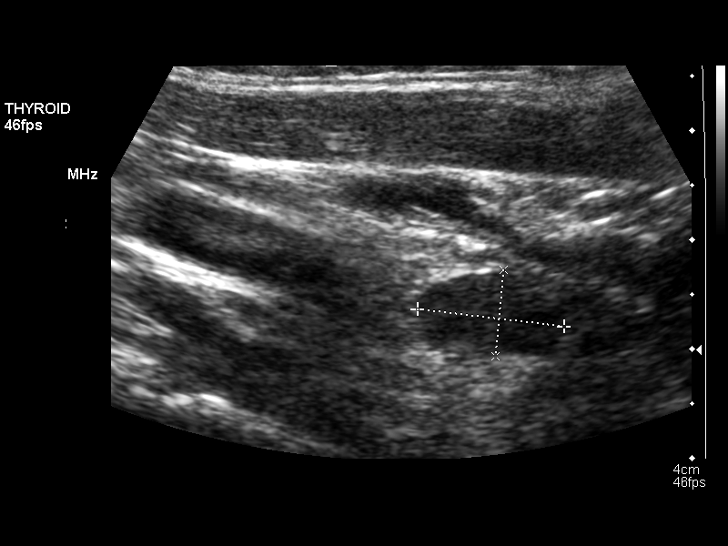
[im 45/60]
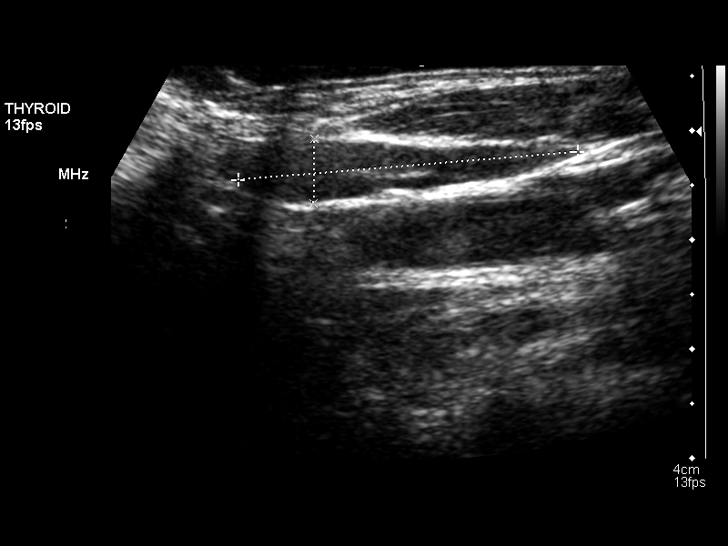
[im 50/60]
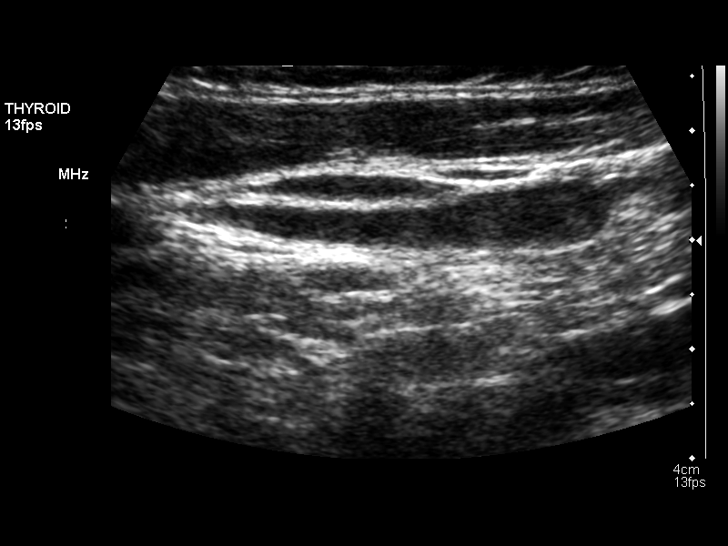
[im 55/60]
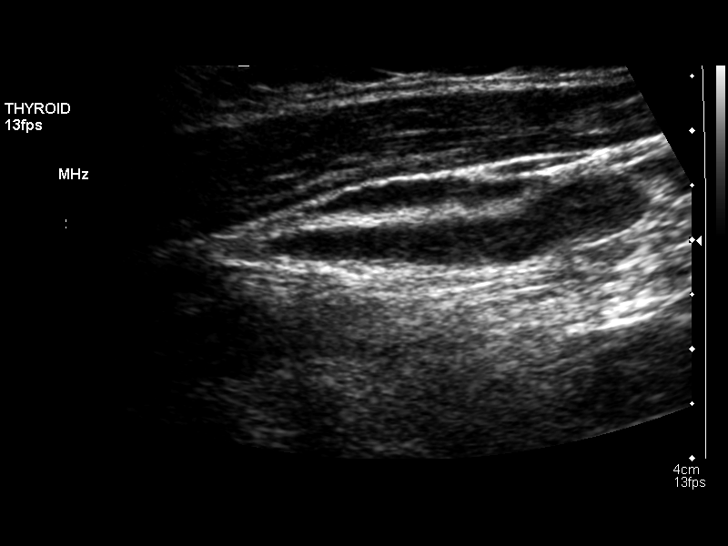
[im 60/60]
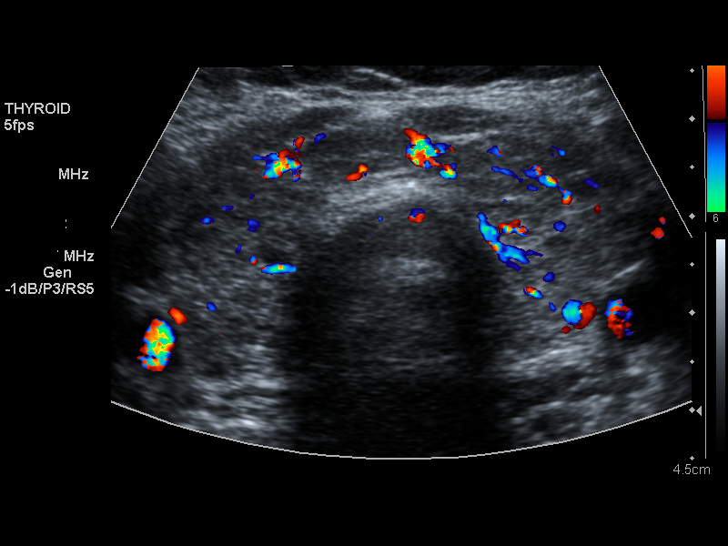

[13 of 25 positions shown; findings below may reference images not displayed]

IMPRESSION: 1.  Stable appearance of the thyroid gland which is somewhat inhomogeneous. 
 2.  However there are more prominent nodes present bilaterally and CT of the neck with IV contrast media may be helpful to assess further.

## 2005-08-11 ENCOUNTER — Encounter: Admission: RE | Admit: 2005-08-11 | Discharge: 2005-08-11 | Payer: Self-pay | Admitting: Otolaryngology

## 2005-08-11 IMAGING — CT CT NECK W/ CM
3 of 4 series · 13 of 27 positions shown, 16 images · IV contrast (75CC OMNI 300)
Comparison: Ultrasound [DATE].

CLINICAL DATA: Adenopathy. 
 CT NECK WITH CONTRAST:
TECHNIQUE: Multidetector CT imaging of the neck was performed following the standard protocol during administration of intravenous contrast.
 Contrast:  75 cc Omnipaque 300.

[Series 2: neck · axial · 0.39mm/px · z∈[+37,+187]mm · 5 of 92 slices shown, 7 images]
[im 16/92  soft-tissue]
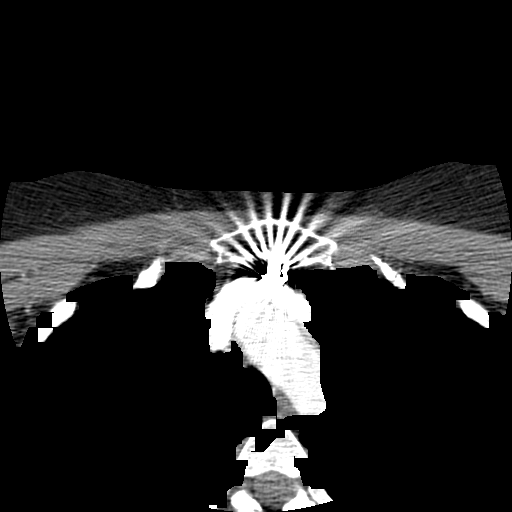
[im 16/92  bone]
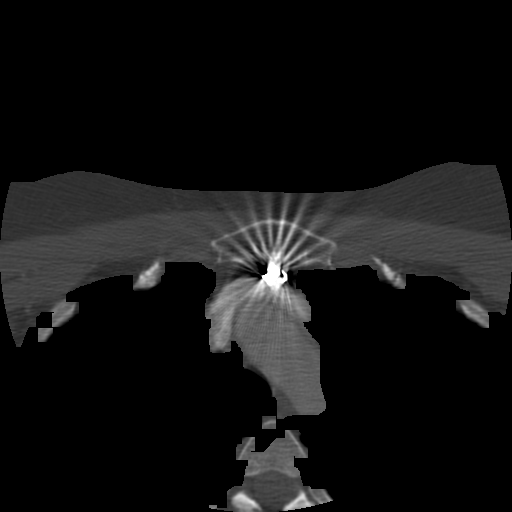
[im 31/92  bone]
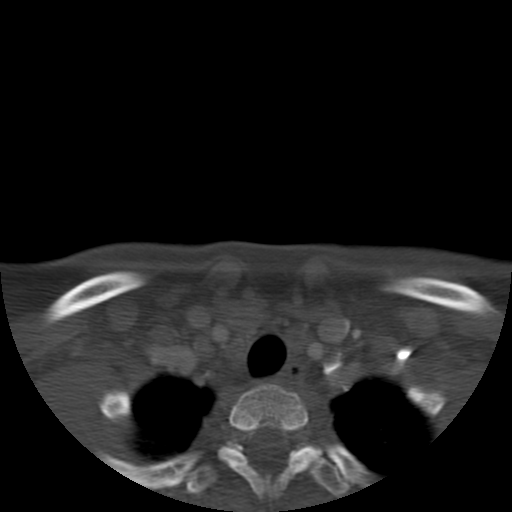
[im 46/92  bone]
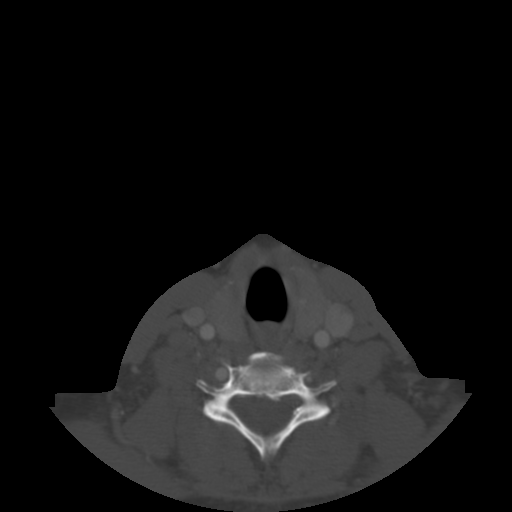
[im 61/92  bone]
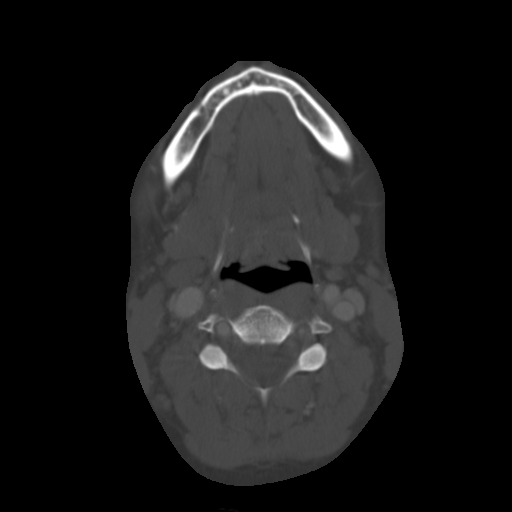
[im 76/92  soft-tissue]
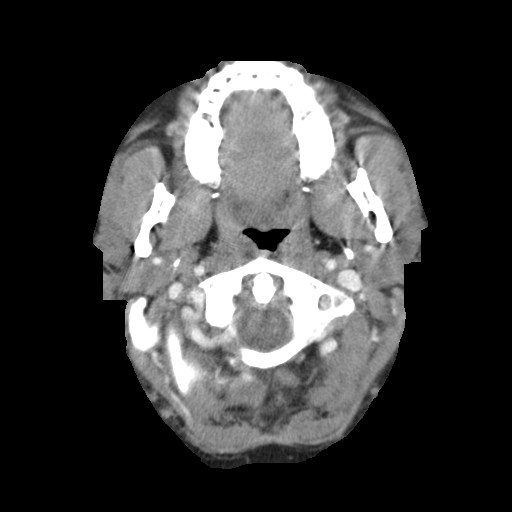
[im 76/92  bone]
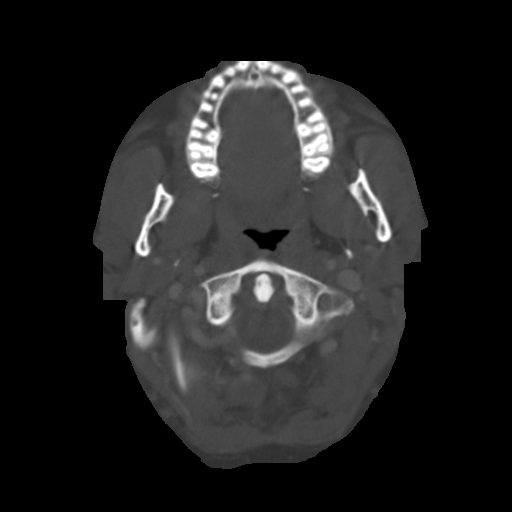

[Series 103: reformatted · sagittal · 0.46mm/px · 3 of 61 slices shown (1 of 2)]
[im 16/61  bone]
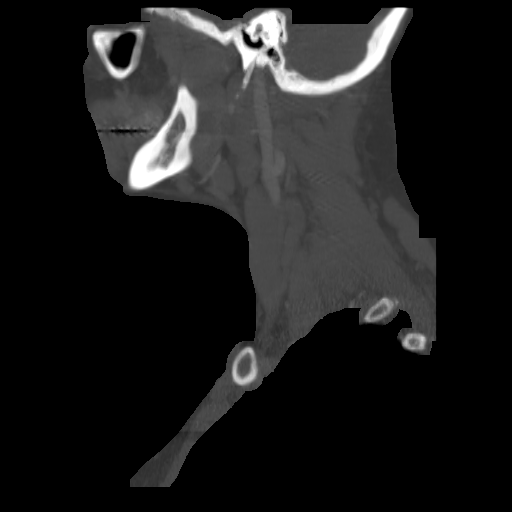
[im 31/61  bone]
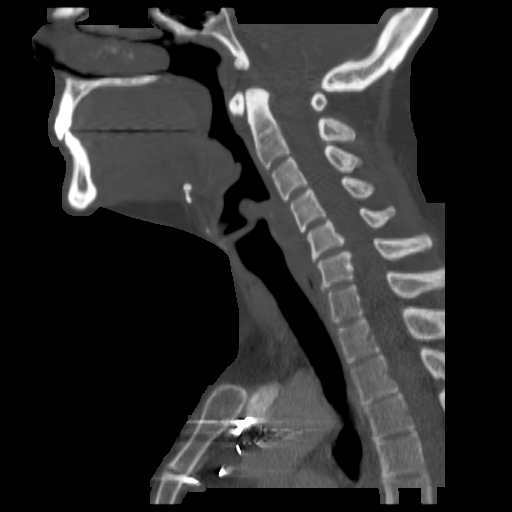
[im 46/61  bone]
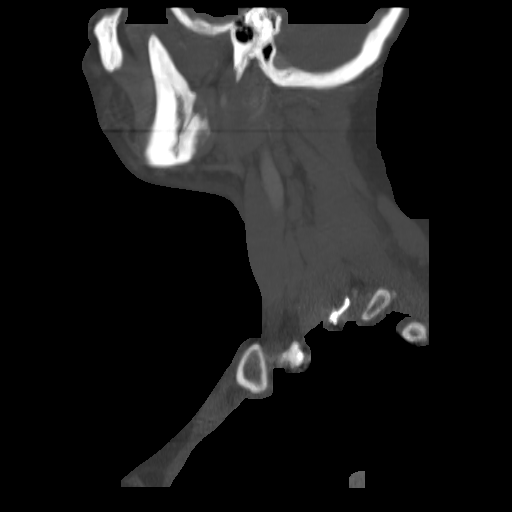

[Series 104: reformatted · coronal · 0.46mm/px · 5 of 69 slices shown, 6 images (2 of 2)]
[im 23/69  bone]
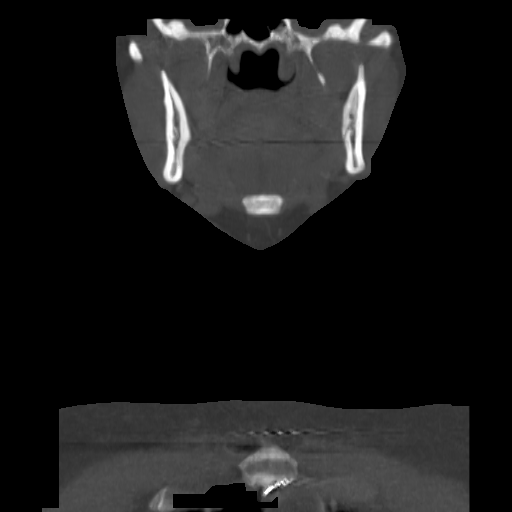
[im 29/69  bone]
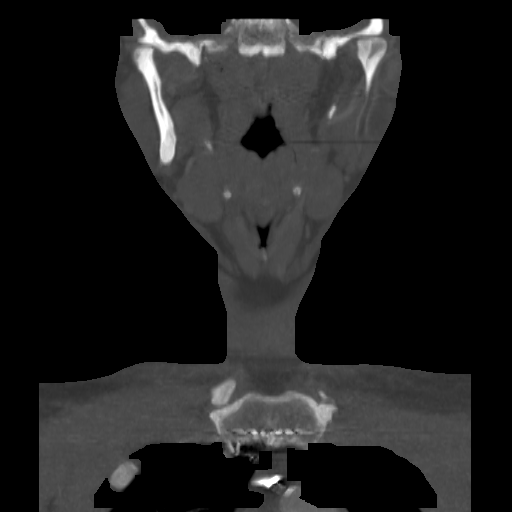
[im 35/69  soft-tissue]
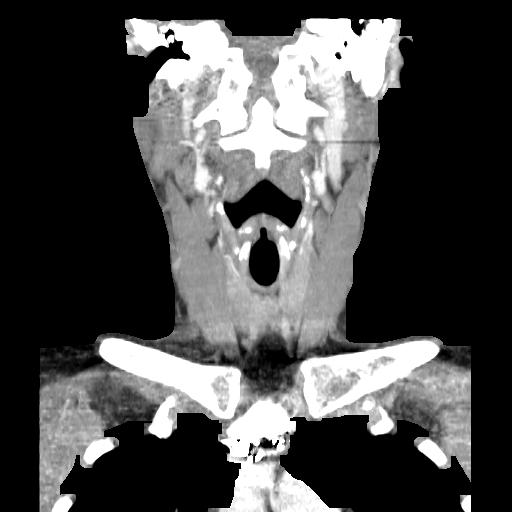
[im 35/69  bone]
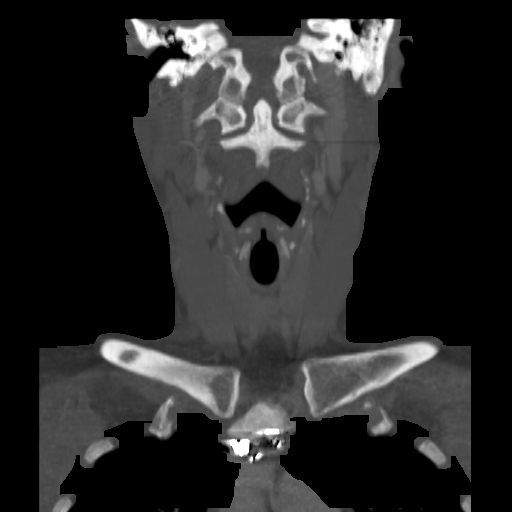
[im 40/69  bone]
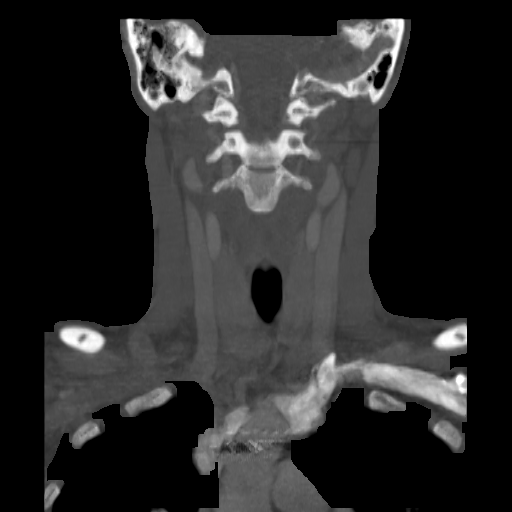
[im 46/69  bone]
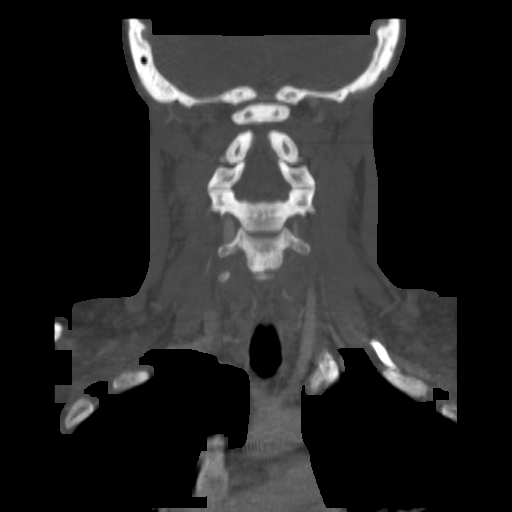

[13 of 27 positions shown; findings below may reference images not displayed]

FINDINGS: The thyroid is mildly enlarged diffusely and heterogeneous.  This is compatible with the diagnosis of thyroiditis.  The right lobe is slightly larger than the left and the isthmus is enlarged.  No focal mass lesions are seen in the thyroid gland. 
 There is cervical adenopathy.  The largest nodes are in the right jugulodigastric region with one node measuring 10 x 17mm and another 16 x 11mm.  These are homogeneous nodes and are probably benign reactive nodes.  There is milder adenopathy in the left neck at the level of the mandible around the jugular vein.  Smaller level III lymph nodes are seen in the neck as well bilaterally.  If the patient has a known history of malignancy then biopsy of these nodes is suggested otherwise I believe they can probably be followed.   There is a retention cyst in the left maxillary sinus.
IMPRESSION: 1.  Enlarged thyroid compatible with thyroiditis.  
 2.  Mild cervical adenopathy which are upper normal in size on the right but are homogeneous and are likely benign.

## 2006-01-20 ENCOUNTER — Encounter: Admission: RE | Admit: 2006-01-20 | Discharge: 2006-01-20 | Payer: Self-pay | Admitting: Family Medicine

## 2006-01-20 ENCOUNTER — Ambulatory Visit: Payer: Self-pay | Admitting: Family Medicine

## 2006-01-20 IMAGING — CR DG LUMBAR SPINE COMPLETE 4+V
5 series · 5 of 5 positions shown · non-contrast
Comparison: None.

LUMBAR SPINE - 4  VIEW:

CLINICAL DATA: Back tenderness.

[view not recorded (1 of 5)]
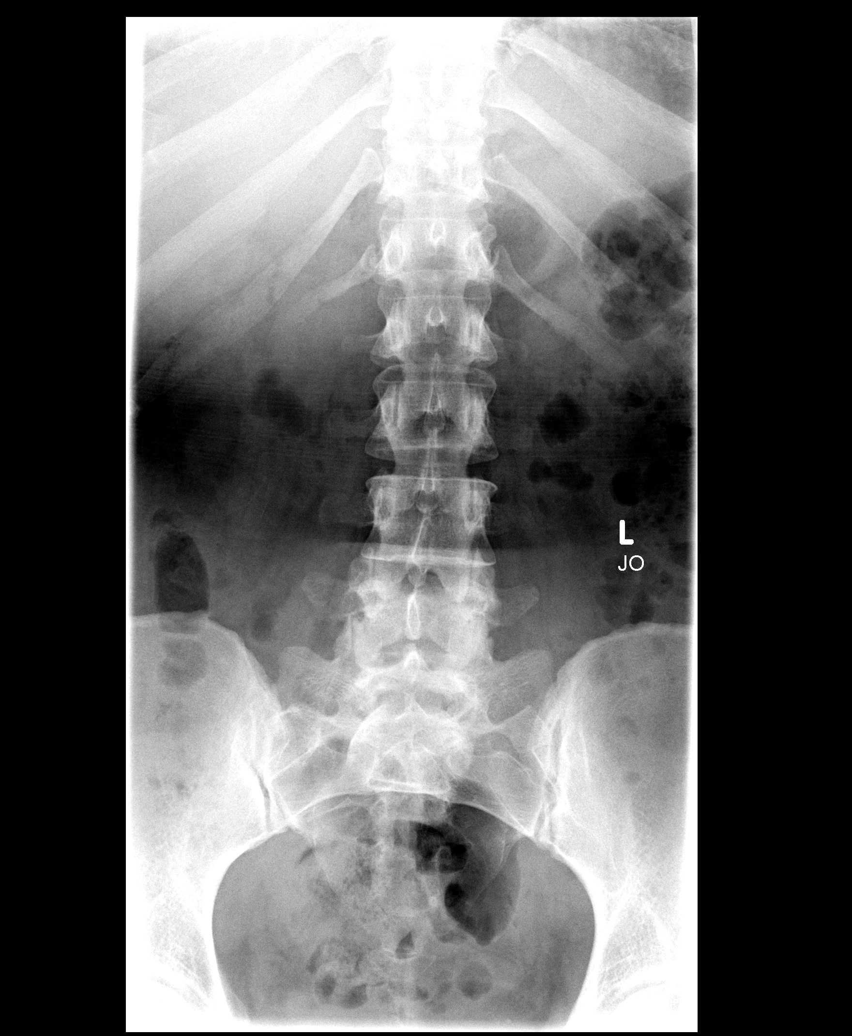

[view not recorded (2 of 5)]
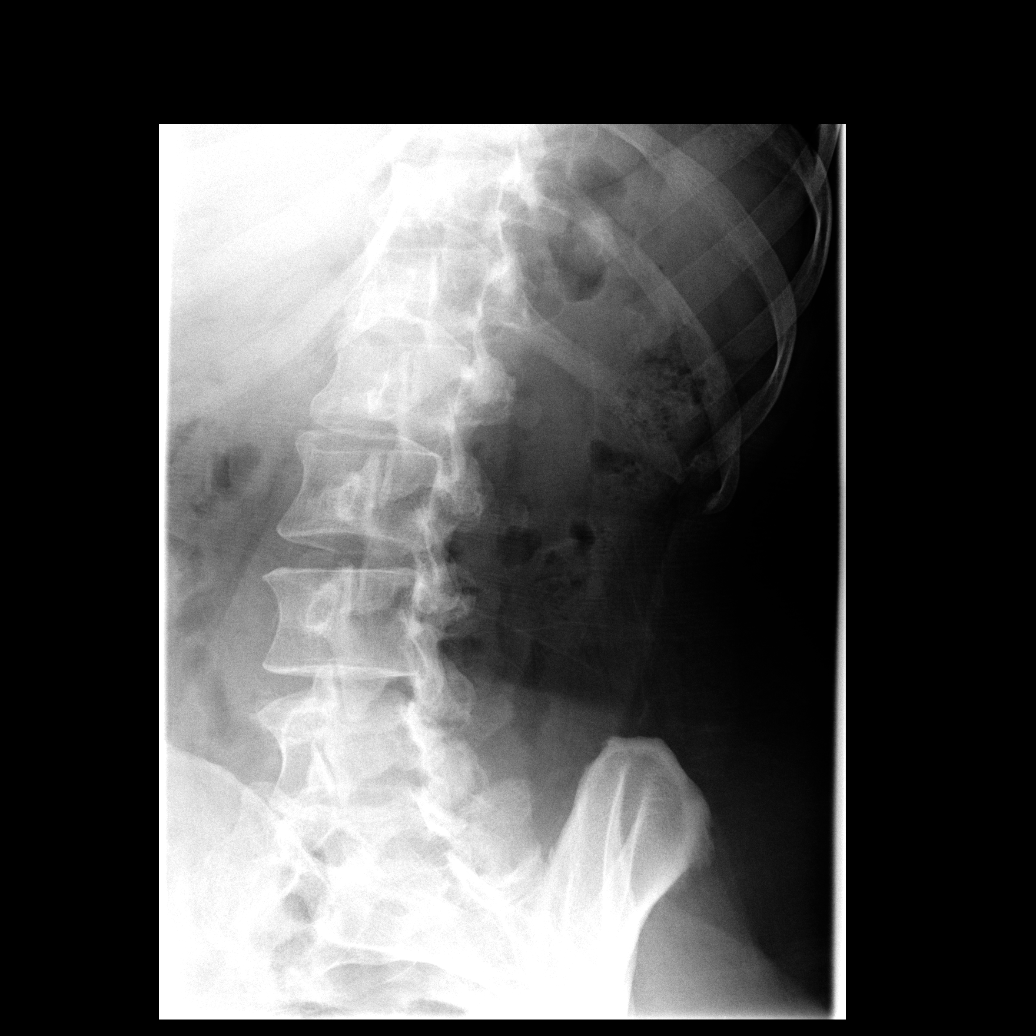

[view not recorded (3 of 5)]
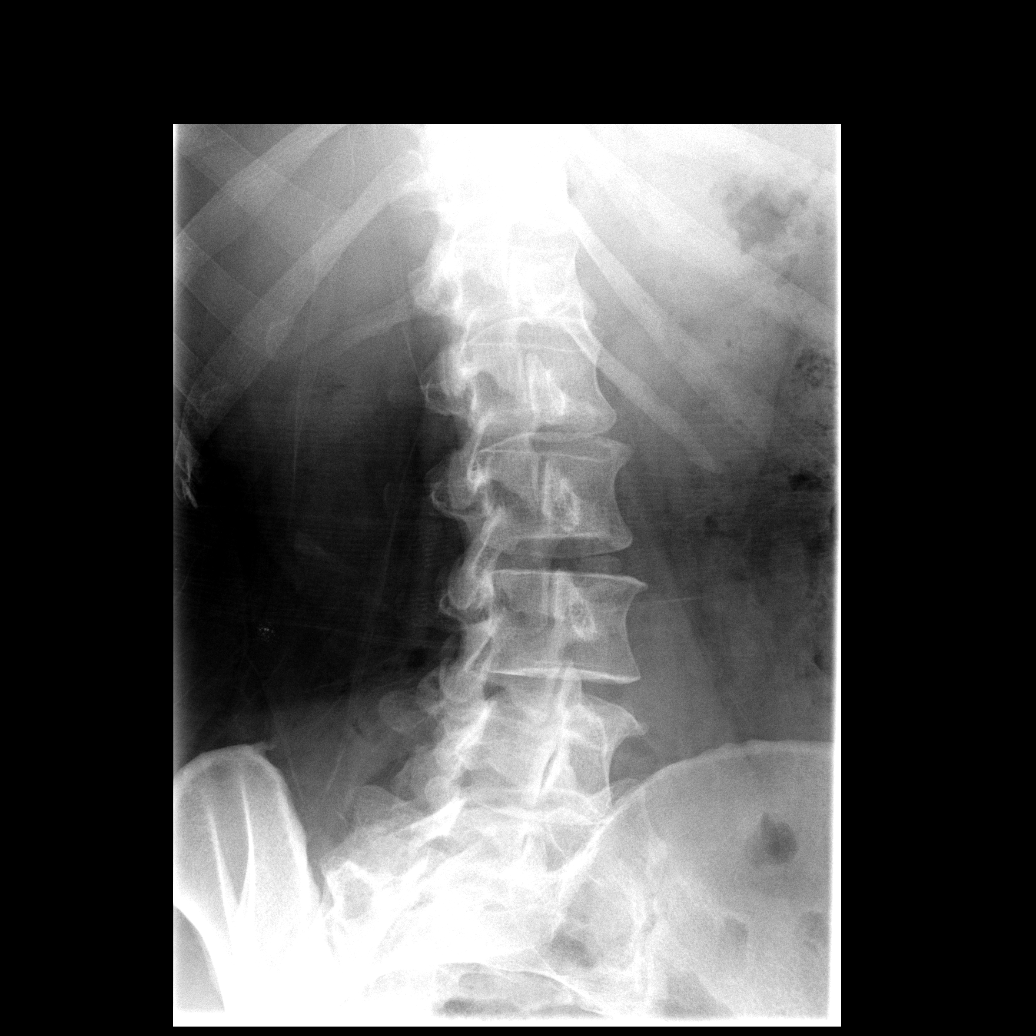

[view not recorded (4 of 5)]
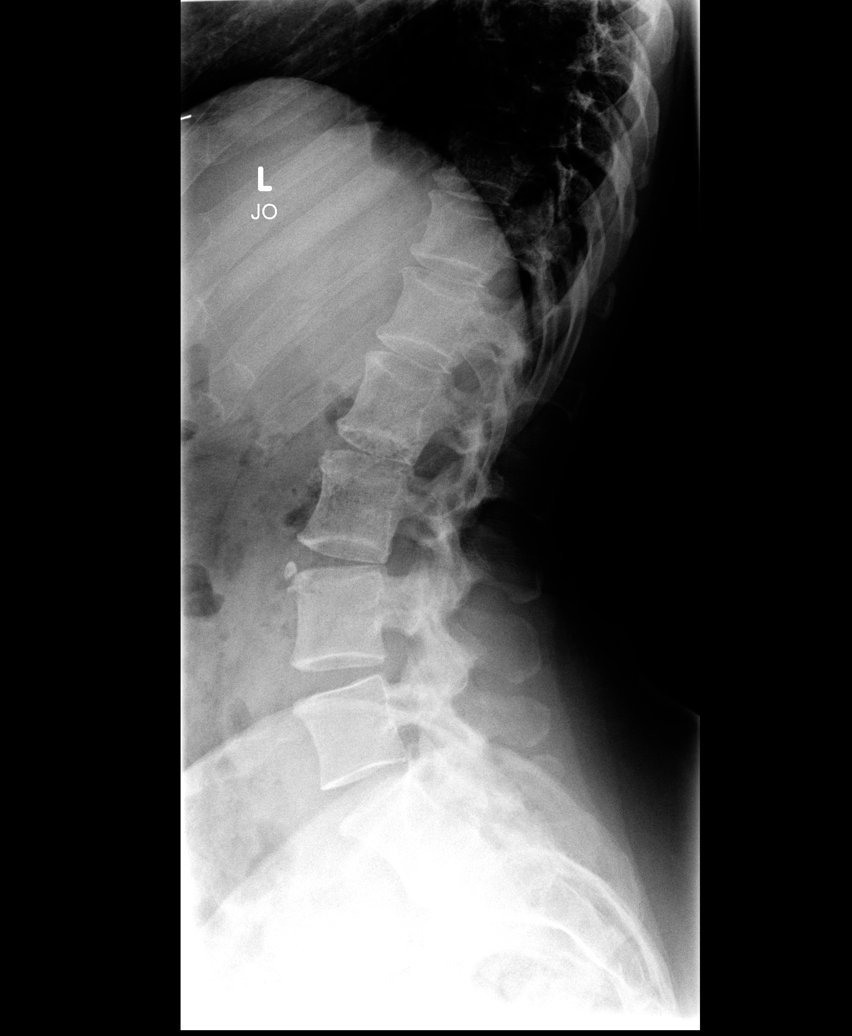

[view not recorded (5 of 5)]
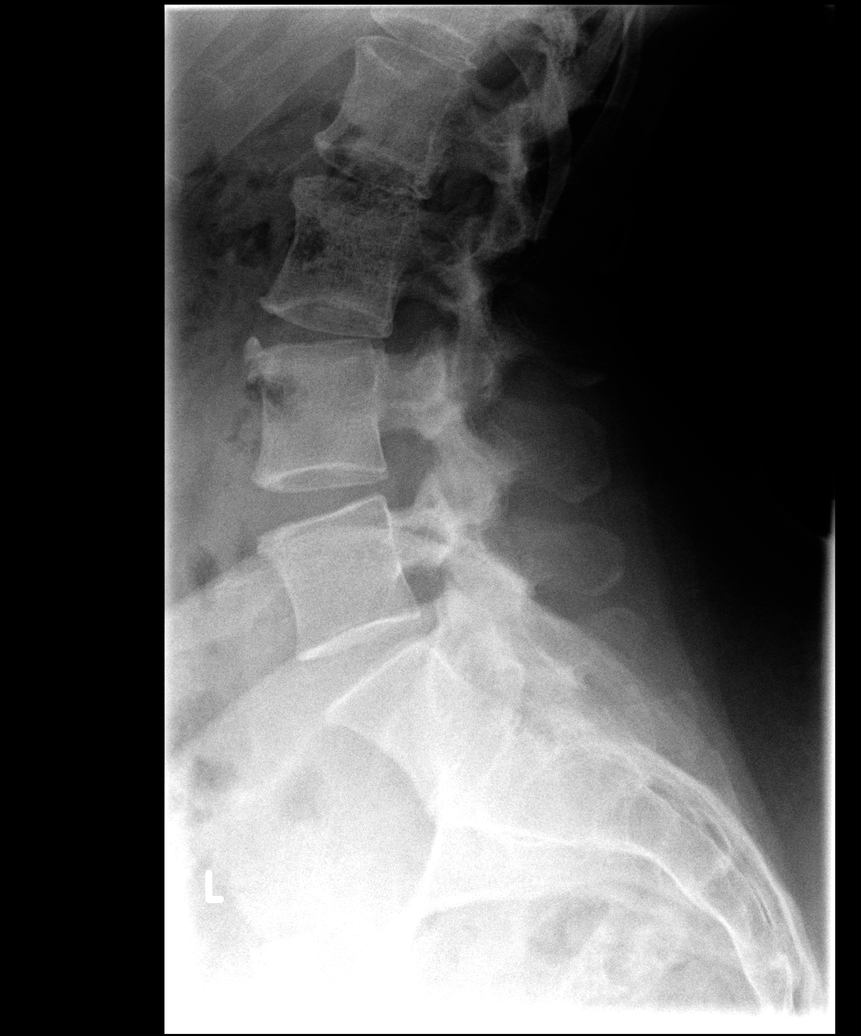

[5 of 5 positions shown; findings below may reference images not displayed]

FINDINGS: Sacralization of L5 noted. Alignment is normal and intervertebral
disc spaces are preserved. Facets are well aligned bilaterally. SI joints are
unremarkable.
IMPRESSION: No acute bony abnormality.

## 2006-01-20 IMAGING — CR DG CERVICAL SPINE COMPLETE 4+V
5 series · 5 of 5 positions shown · non-contrast
Comparison: None.

CERVICAL SPINE - 5  VIEW:

CLINICAL DATA: Numbness in both hands.

[view not recorded (1 of 5)]
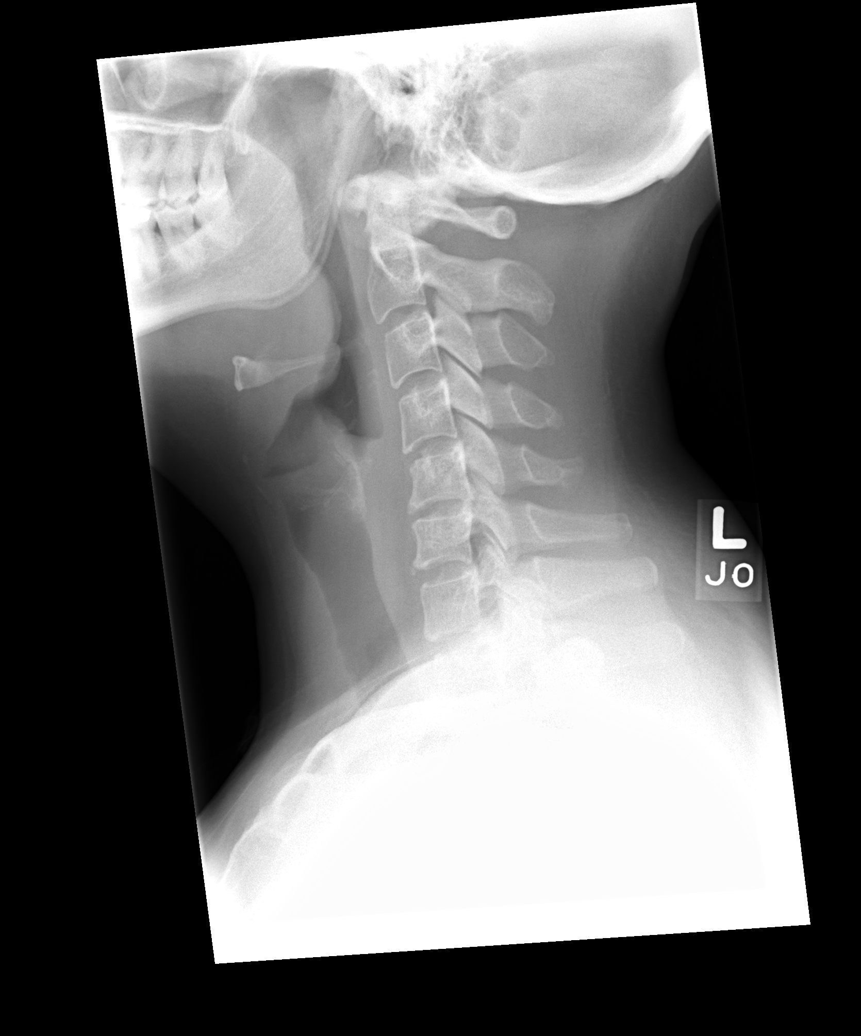

[view not recorded (2 of 5)]
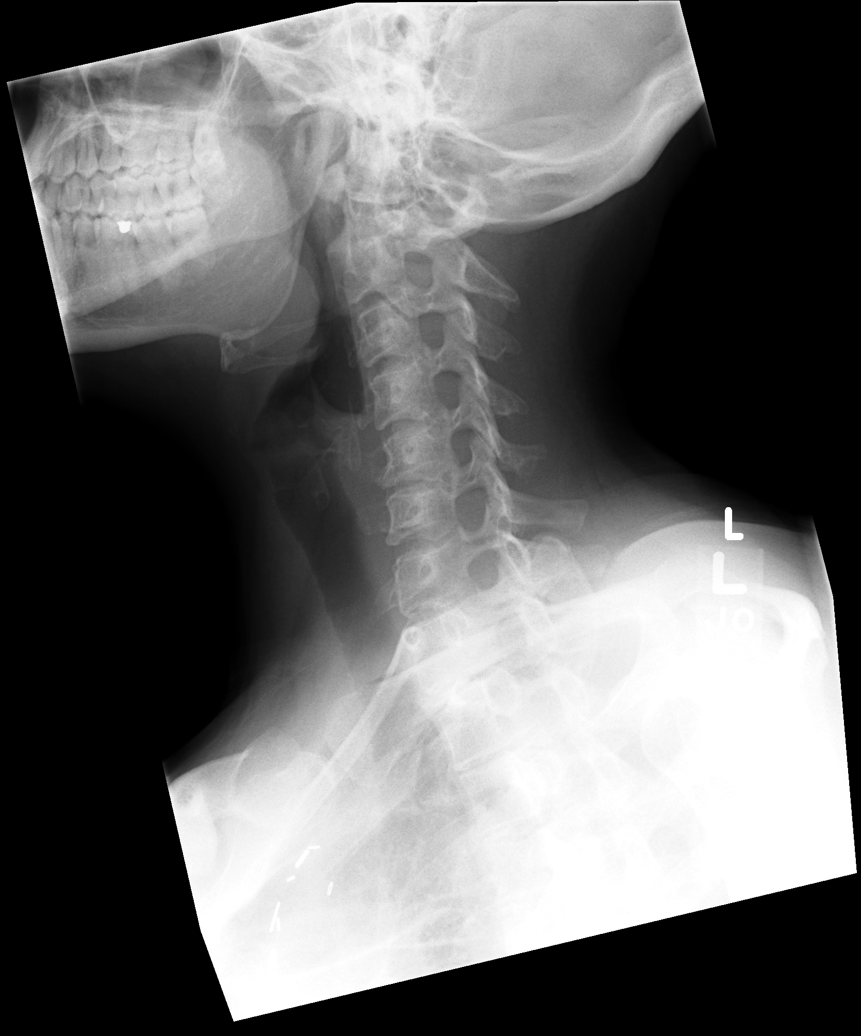

[view not recorded (3 of 5)]
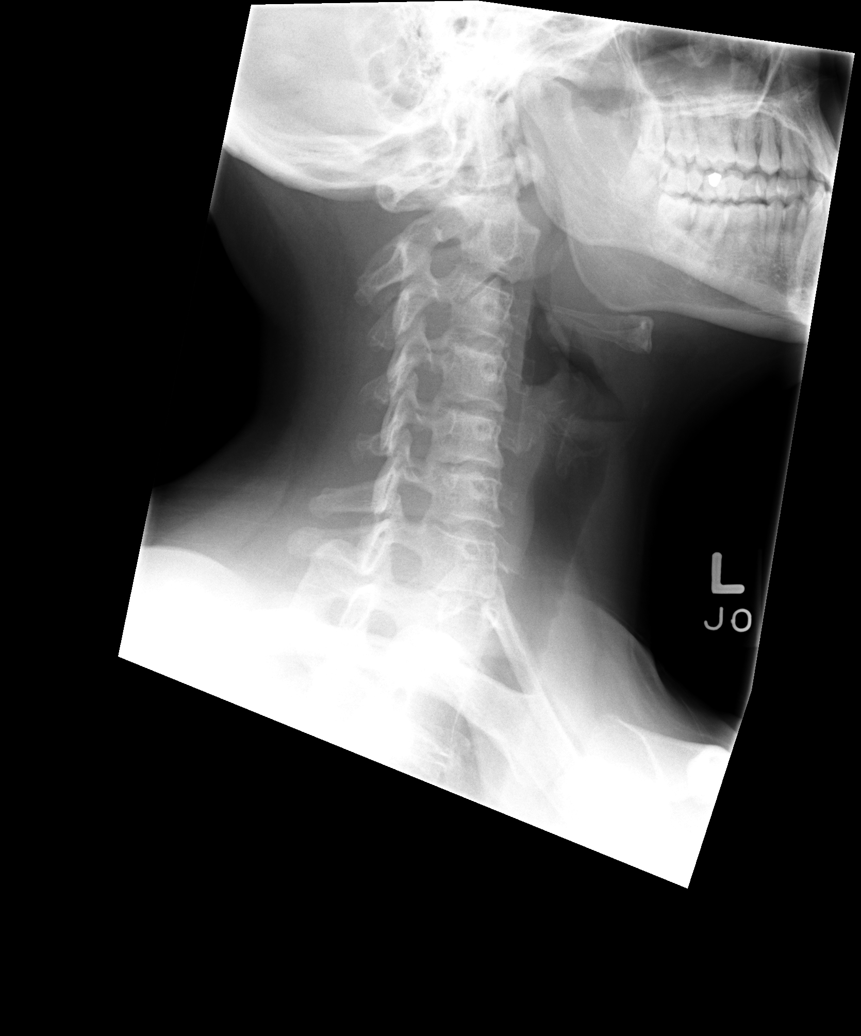

[view not recorded (4 of 5)]
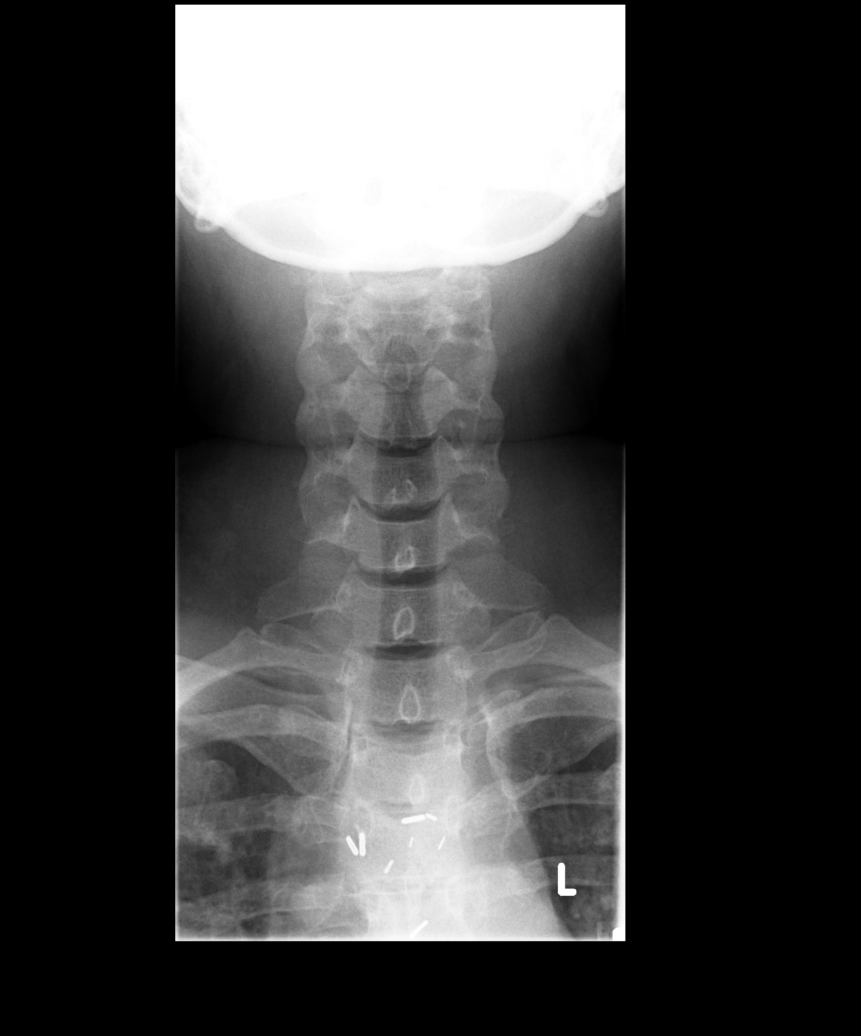

[view not recorded (5 of 5)]
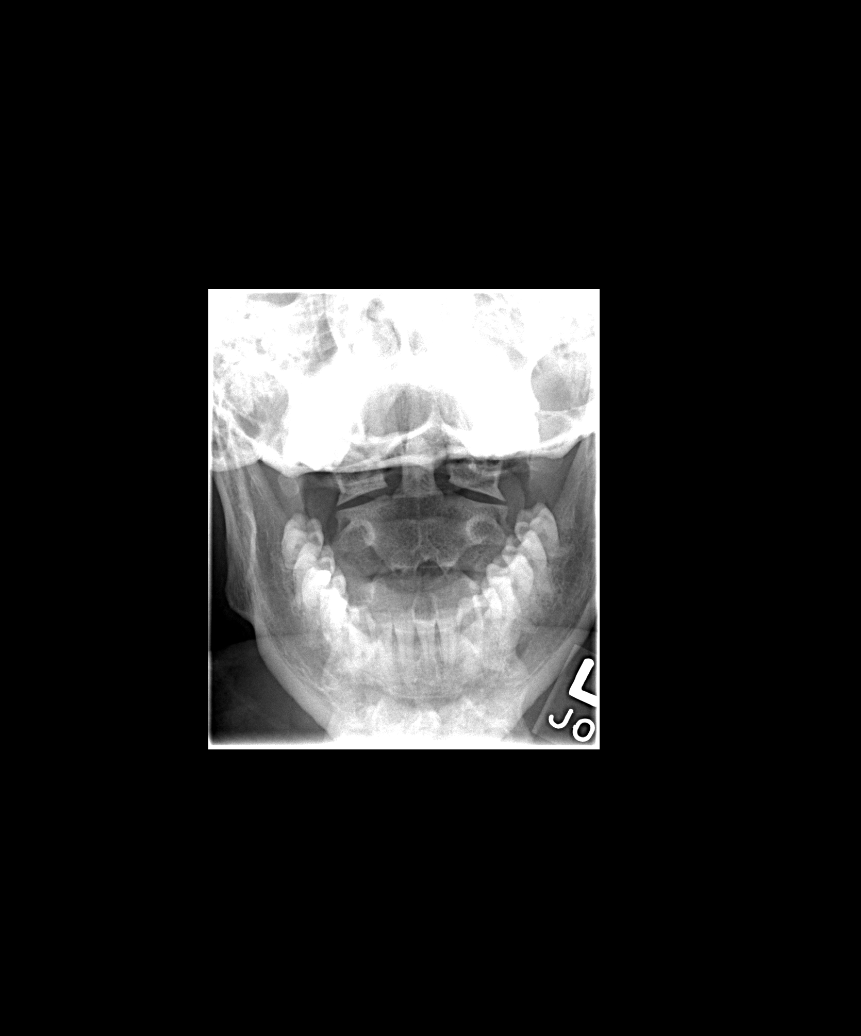

[5 of 5 positions shown; findings below may reference images not displayed]

FINDINGS: Straightening of the normal cervical lordosis is noted. Mild endplate
degeneration at C4-C5, C5-C6 and C6-C7. Facets are well aligned bilaterally. No
evidence for bony neuroforaminal encroachment. No prevertebral soft tissue
swelling.
IMPRESSION: Straightening of the normal cervical lordosis with mild degenerative endplate
changes in the lower cervical spine.

## 2006-03-29 IMAGING — CR DG THORACIC SPINE 3V
4 series · 4 of 4 positions shown · non-contrast
Comparison: None.

THORACIC SPINE - 2  VIEW:

CLINICAL DATA: Back tenderness.

[view not recorded (1 of 4)]
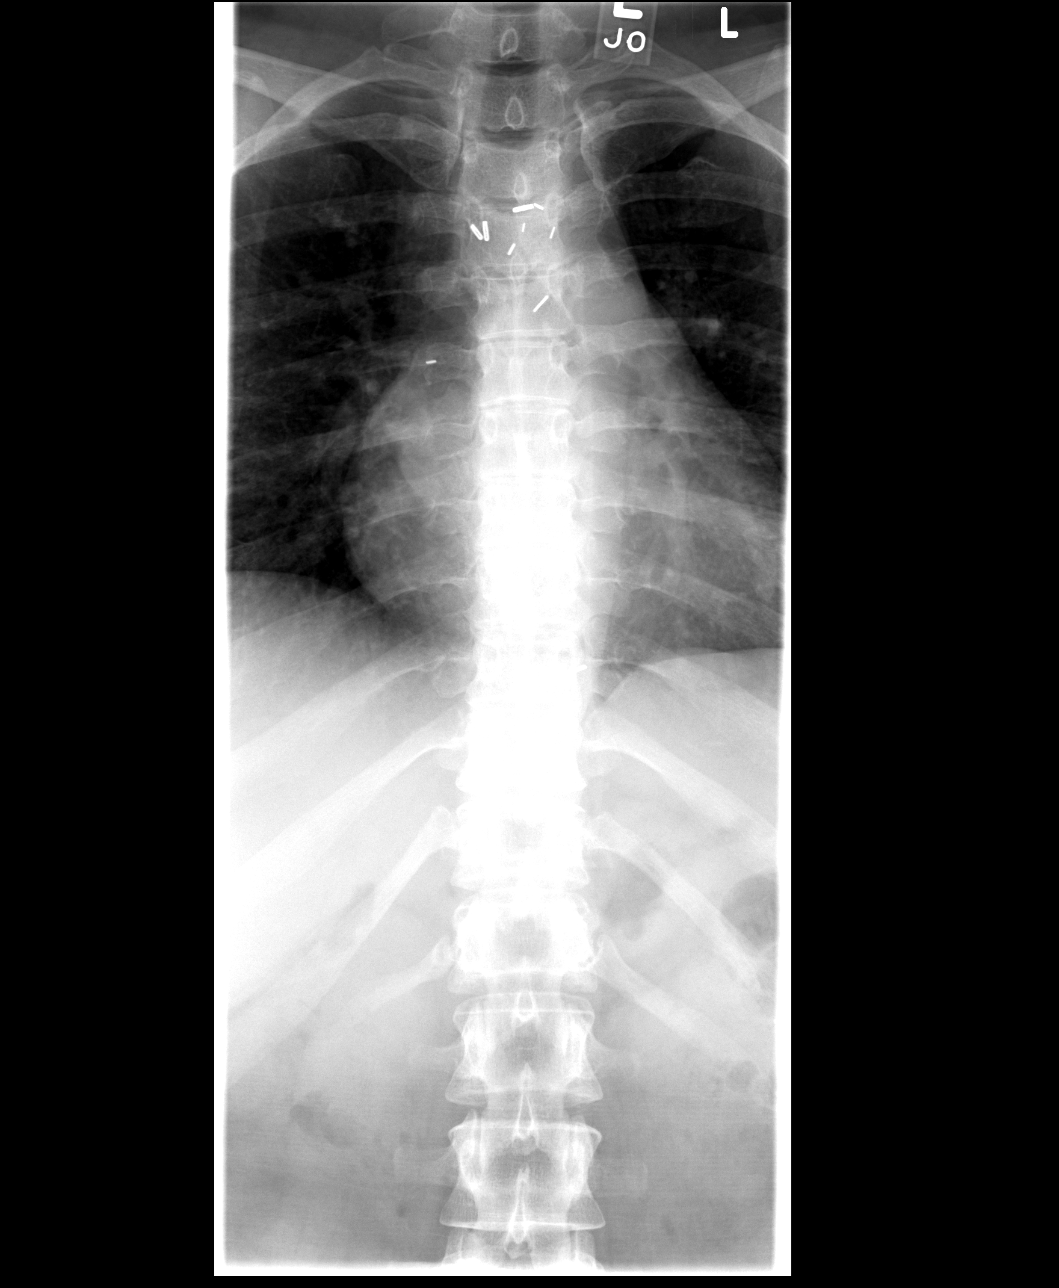

[view not recorded (2 of 4)]
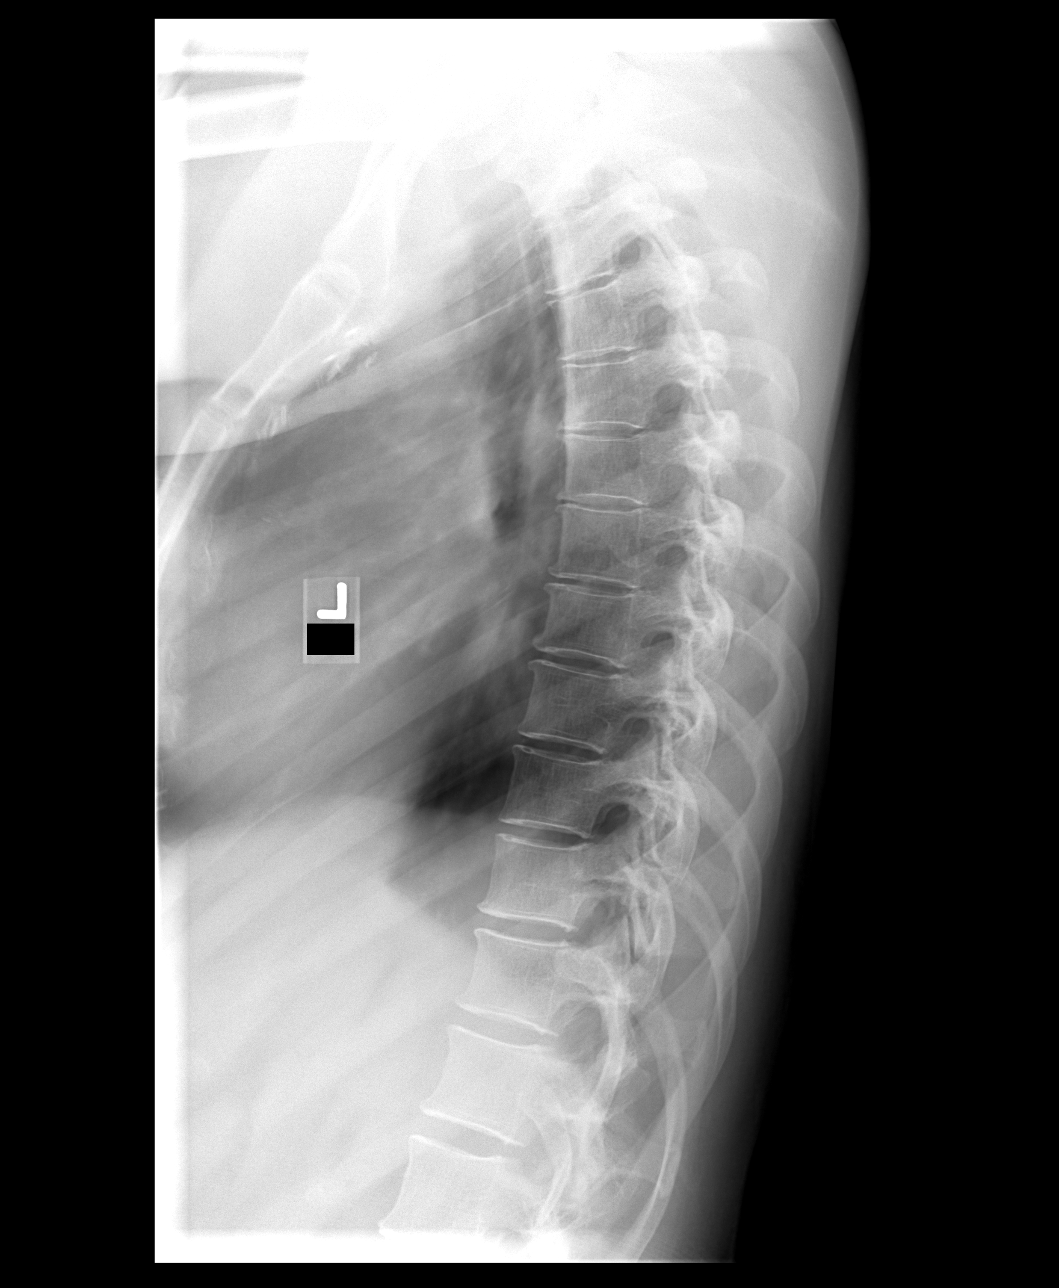

[view not recorded (3 of 4)]
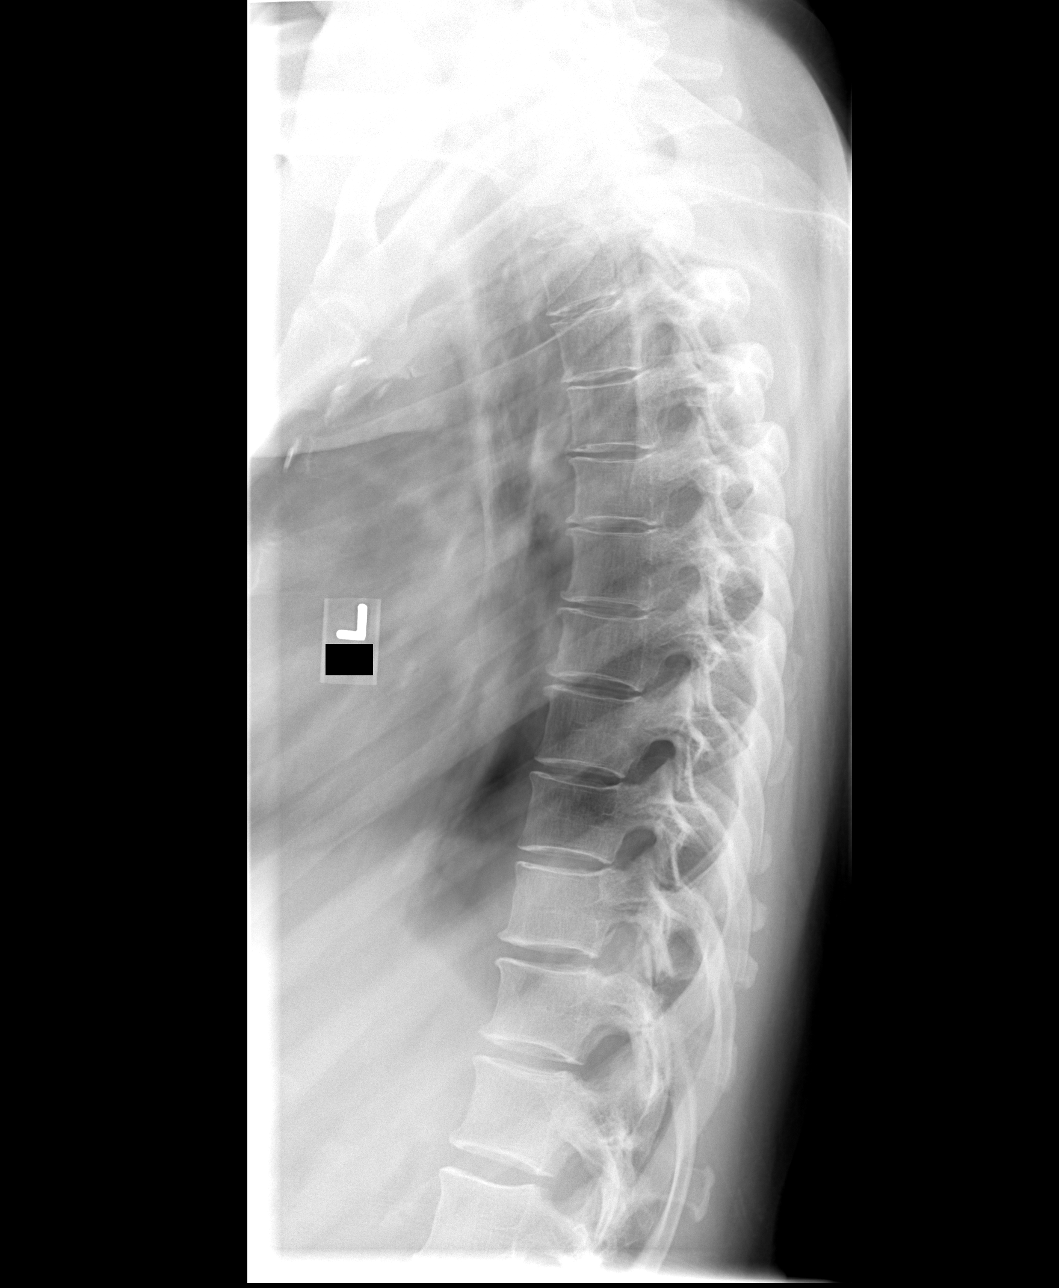

[view not recorded (4 of 4)]
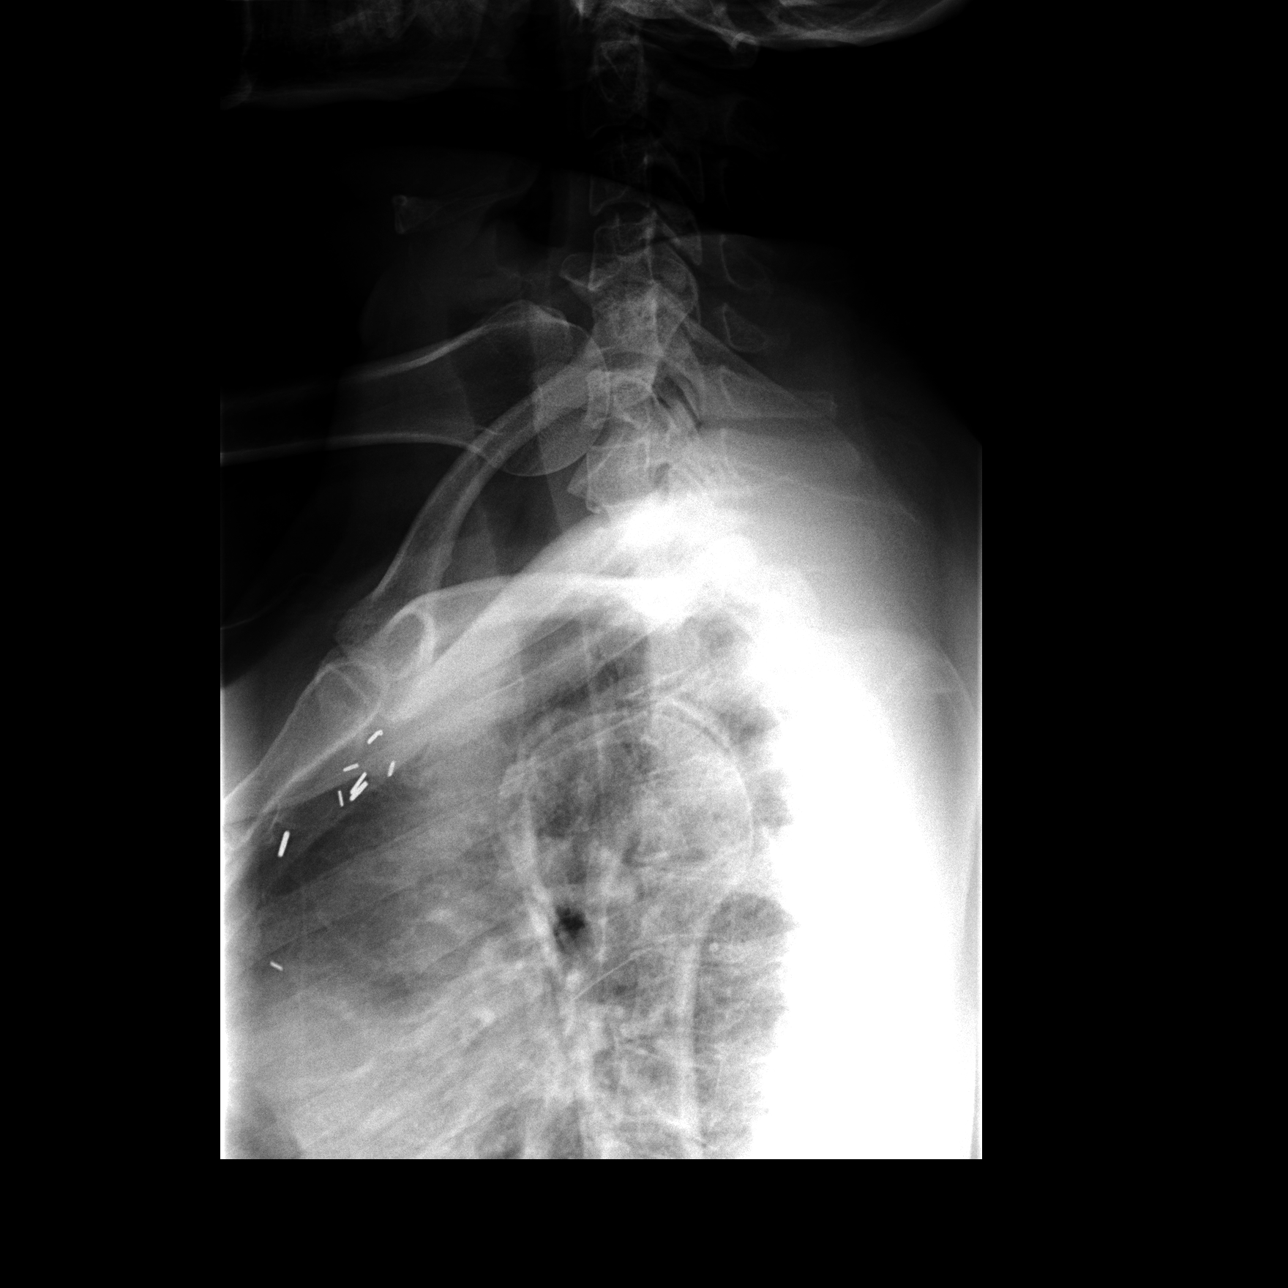

[4 of 4 positions shown; findings below may reference images not displayed]

FINDINGS: No evidence for acute fracture or subluxation. Intervertebral disc
spaces are preserved.
IMPRESSION: No acute bony abnormality.

## 2006-11-24 ENCOUNTER — Encounter: Admission: RE | Admit: 2006-11-24 | Discharge: 2006-11-24 | Payer: Self-pay | Admitting: Endocrinology

## 2006-11-24 IMAGING — US US SOFT TISSUE HEAD/NECK
1 series · 14 of 25 positions shown · non-contrast
Comparison: [DATE].

CLINICAL DATA: 35 year old with thyroid goiter. 
 THYROID ULTRASOUND:
TECHNIQUE: Ultrasound examination of the thyroid gland and adjacent soft tissue structures was performed.

[Series 1: us soft tissue head/neck · 0.08mm/px · 14 of 53 slices shown]
[im 1/53]
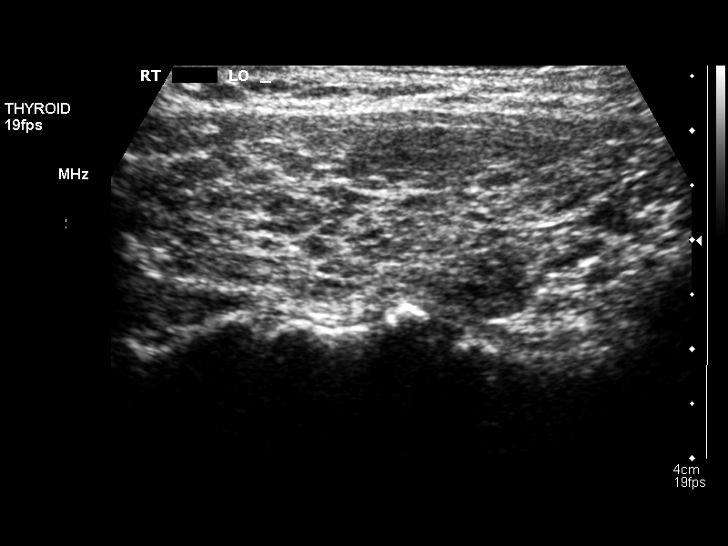
[im 5/53]
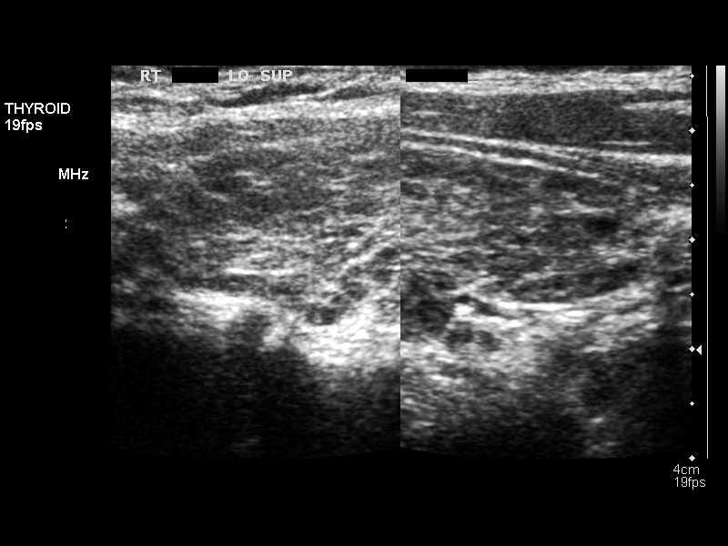
[im 9/53]
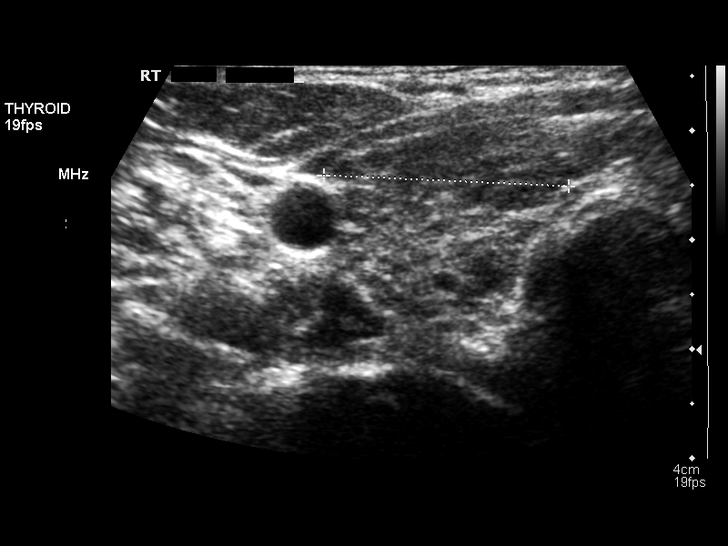
[im 14/53]
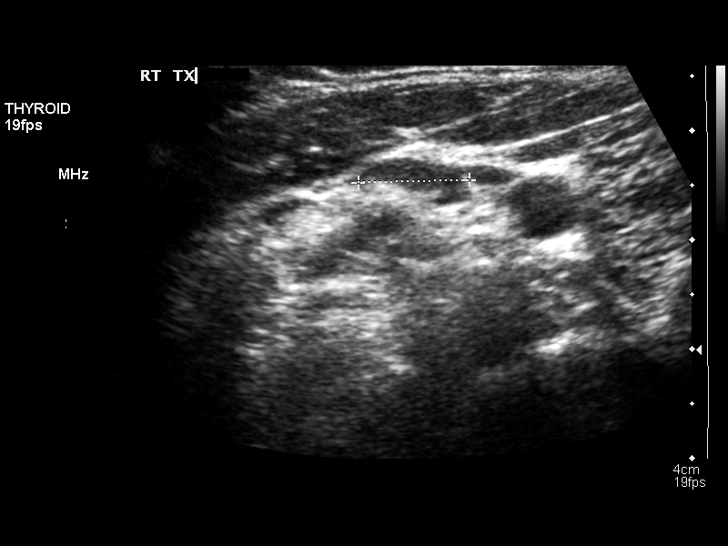
[im 18/53]
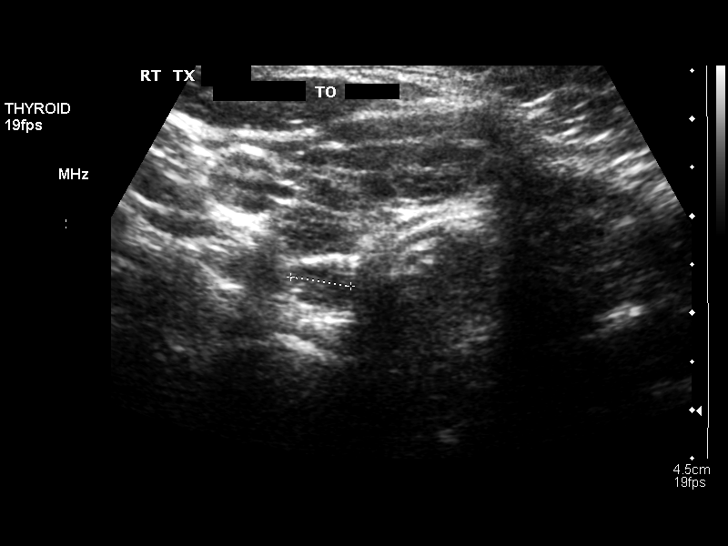
[im 20/53]
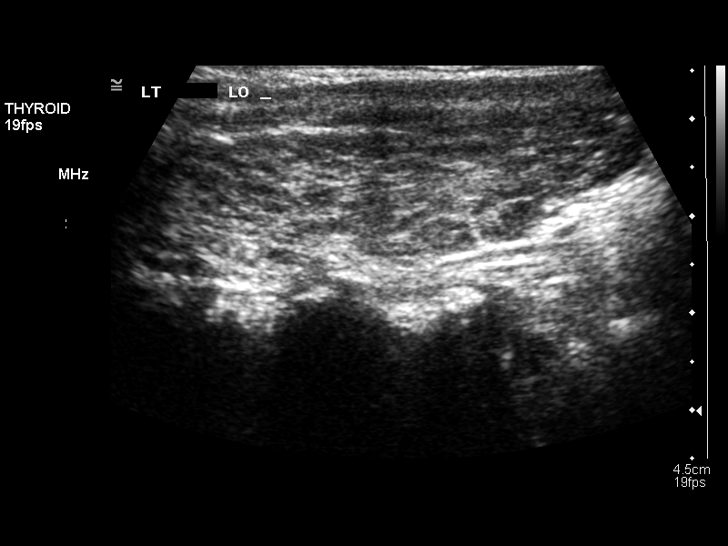
[im 24/53]
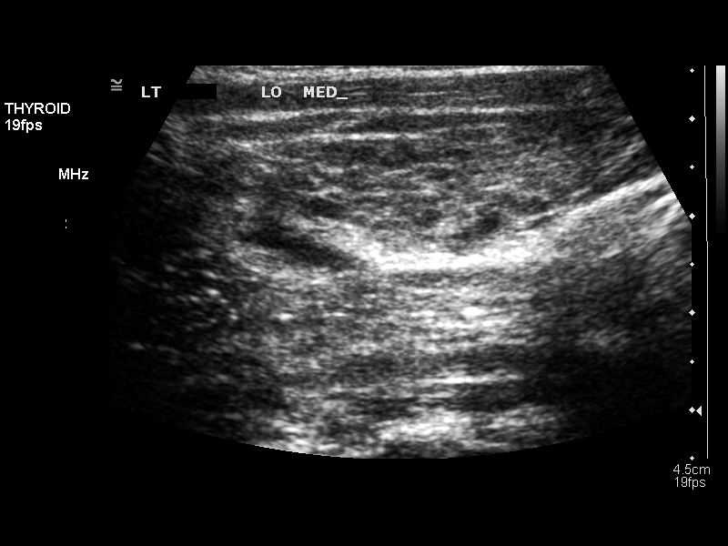
[im 29/53]
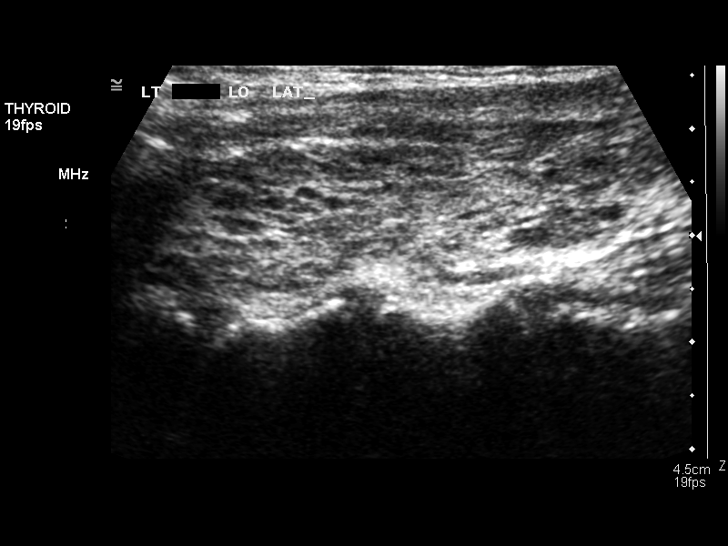
[im 33/53]
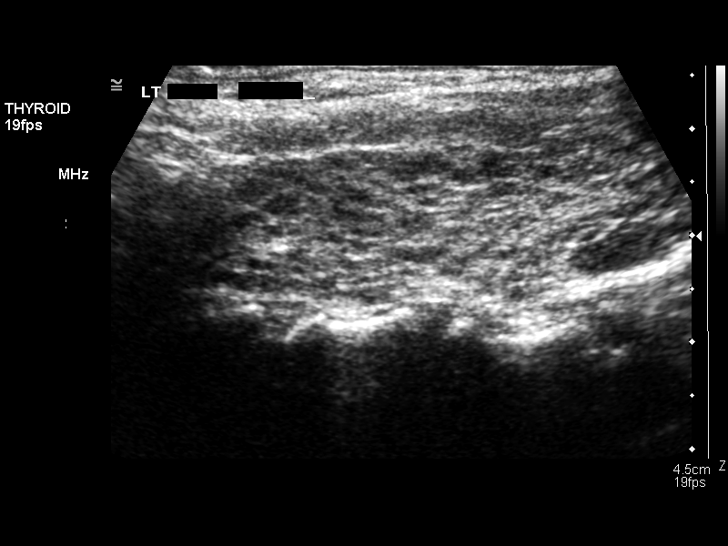
[im 35/53]
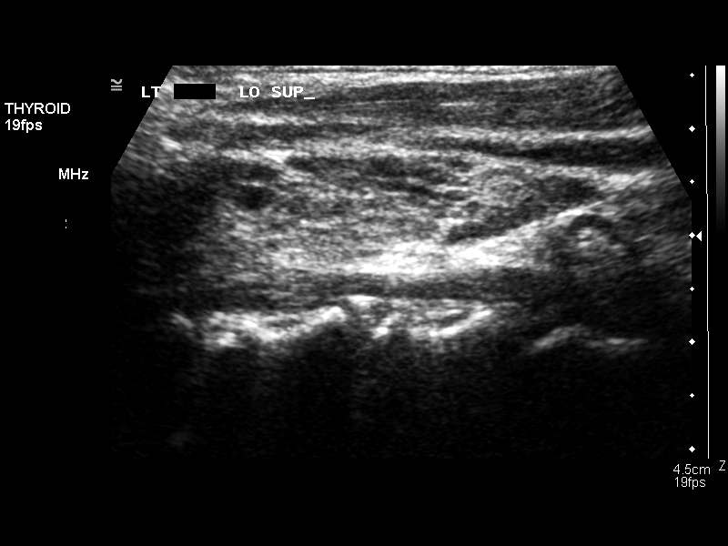
[im 40/53]
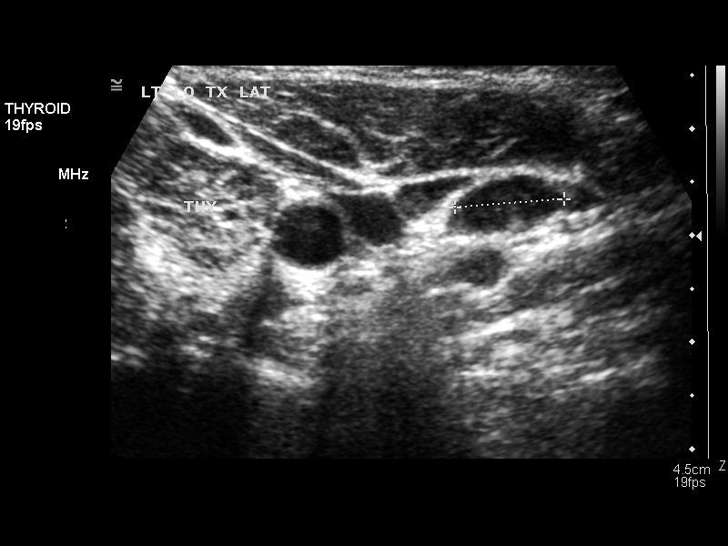
[im 44/53]
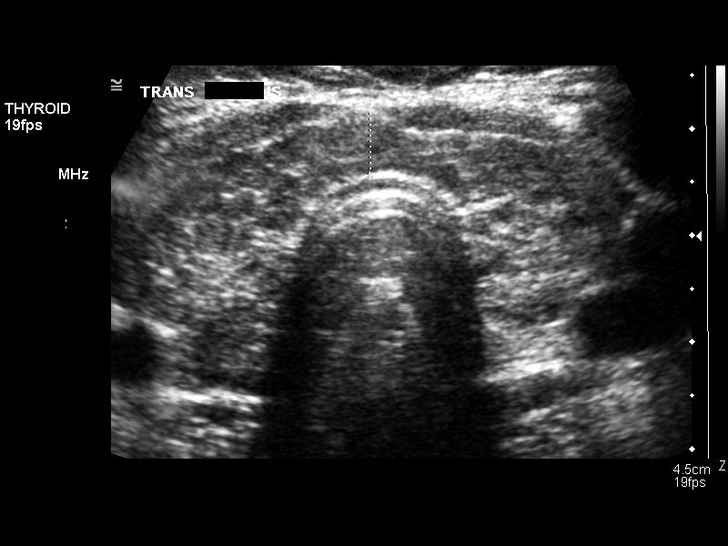
[im 48/53]
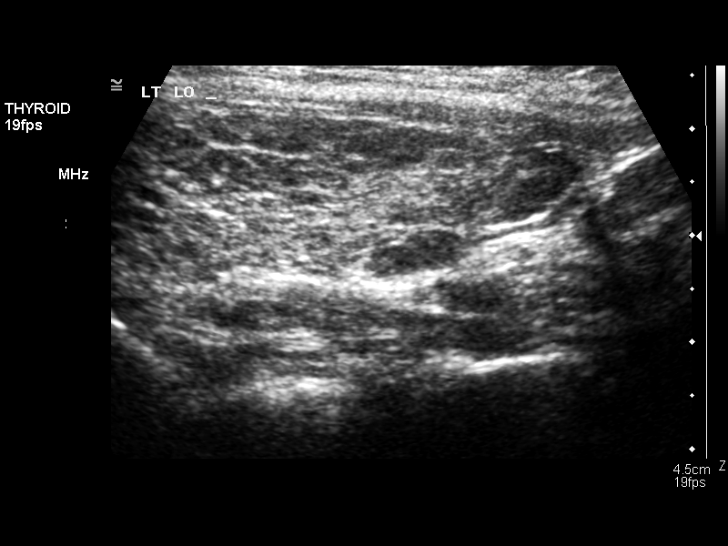
[im 53/53]
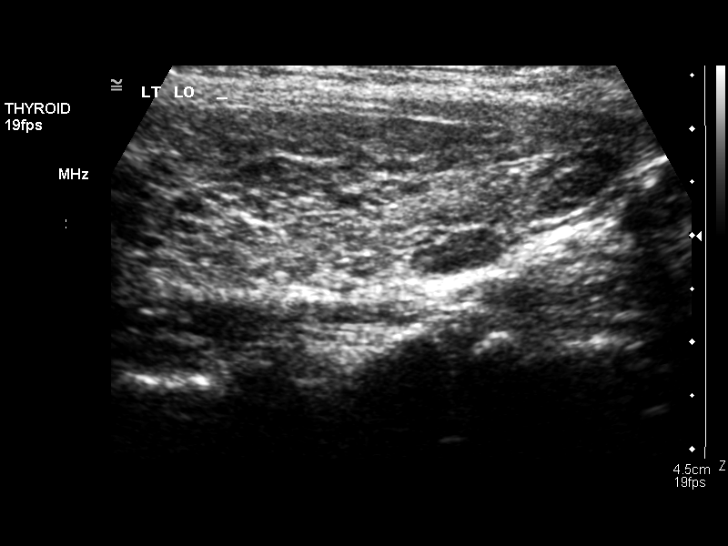

[14 of 25 positions shown; findings below may reference images not displayed]

FINDINGS: The right thyroid lobe measures 5.1 x 1.6 x 2.3cm.  The left lobe measures 5.4 x 1.3 x 1.8cm.  The isthmus measures .6cm.  
 Echotexture is very heterogeneous.  There are a few small scattered thyroid lesions but these all measure less than 1cm.  Bilateral neck nodes are again noted but they appear to have a fatty hilum.  The largest node is a somewhat elongated node and may actually be two adjacent nodes that measure a total of 3.2 x 1cm.
IMPRESSION: 1.  Stable overall appearance of the thyroid gland with a very heterogeneous echotexture and slight enlargement.  
 2.  Three small thyroid nodules all measuring less than 1cm. 
 3.  Persistent scattered borderline neck nodes.

## 2007-04-07 ENCOUNTER — Ambulatory Visit (HOSPITAL_COMMUNITY): Admission: RE | Admit: 2007-04-07 | Discharge: 2007-04-07 | Payer: Self-pay | Admitting: Obstetrics and Gynecology

## 2007-11-22 ENCOUNTER — Encounter: Admission: RE | Admit: 2007-11-22 | Discharge: 2007-11-22 | Payer: Self-pay | Admitting: Endocrinology

## 2007-11-22 IMAGING — US US SOFT TISSUE HEAD/NECK
1 series · 14 of 25 positions shown · non-contrast
Comparison: Thyroid ultrasound examinations [DATE] and
[DATE].

CLINICAL DATA: Goiter.

THYROID ULTRASOUND
TECHNIQUE: Ultrasound examination of the thyroid gland and adjacent
soft tissues was performed.

[Series 1: us soft tissue head/neck · 0.09mm/px · 14 of 31 slices shown]
[im 1/31]
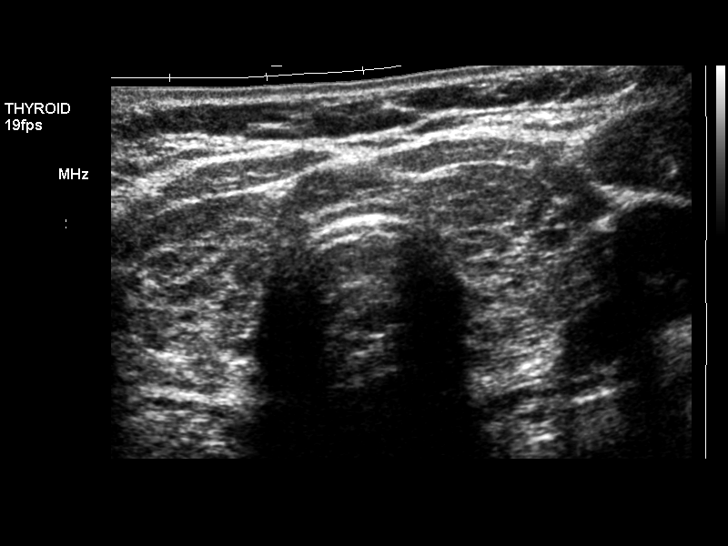
[im 3/31]
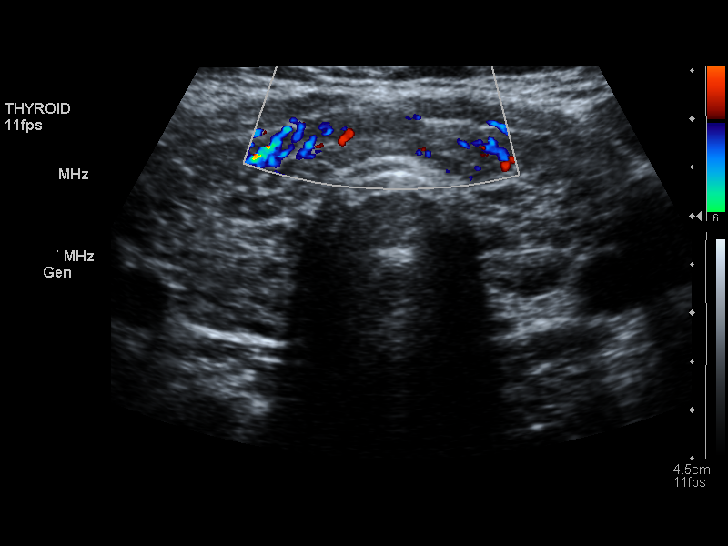
[im 6/31]
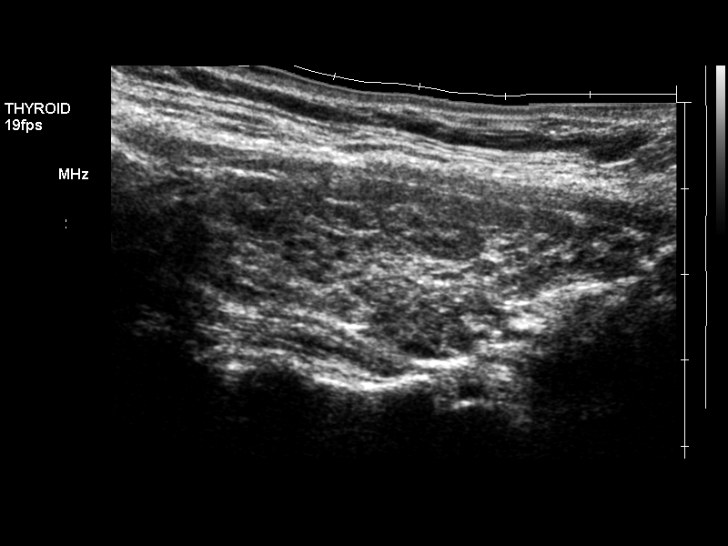
[im 8/31]
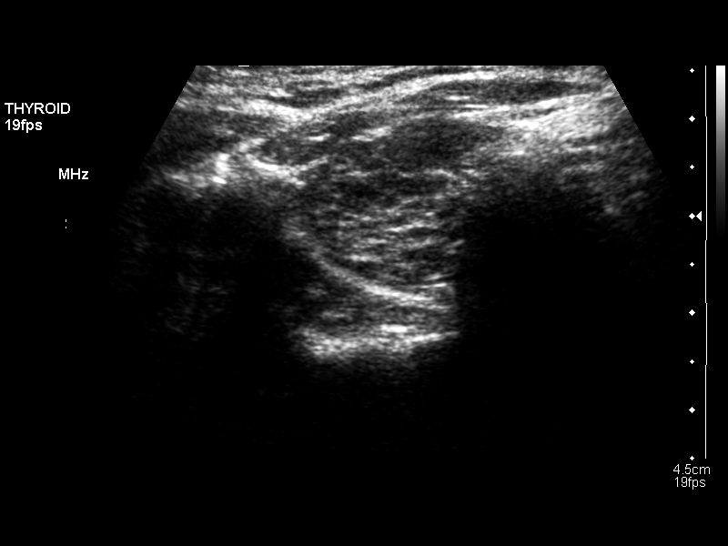
[im 11/31]
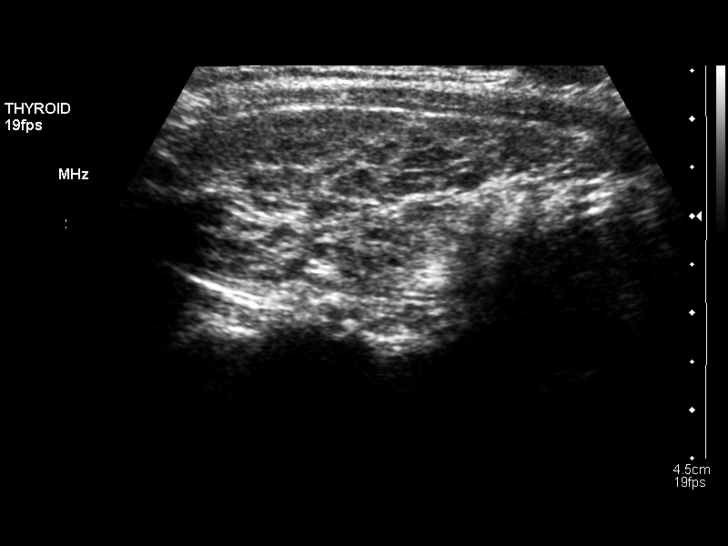
[im 12/31]
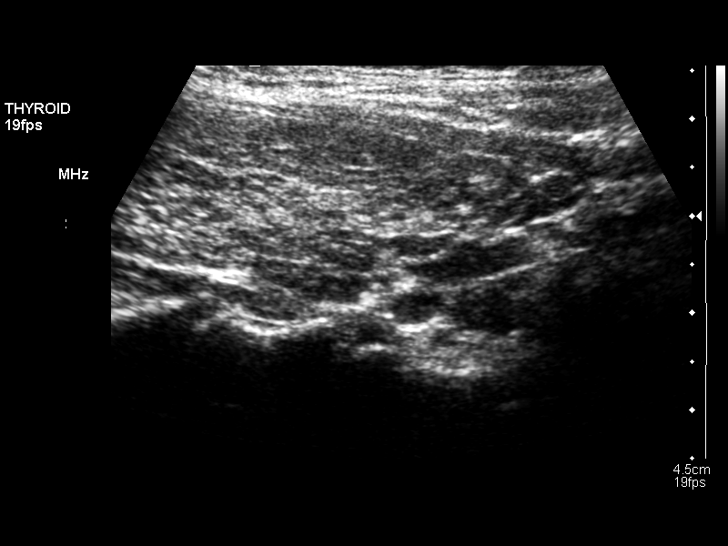
[im 14/31]
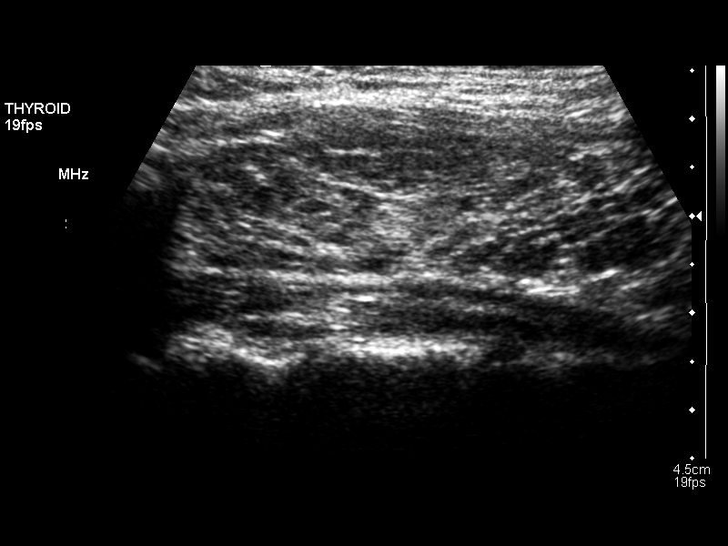
[im 17/31]
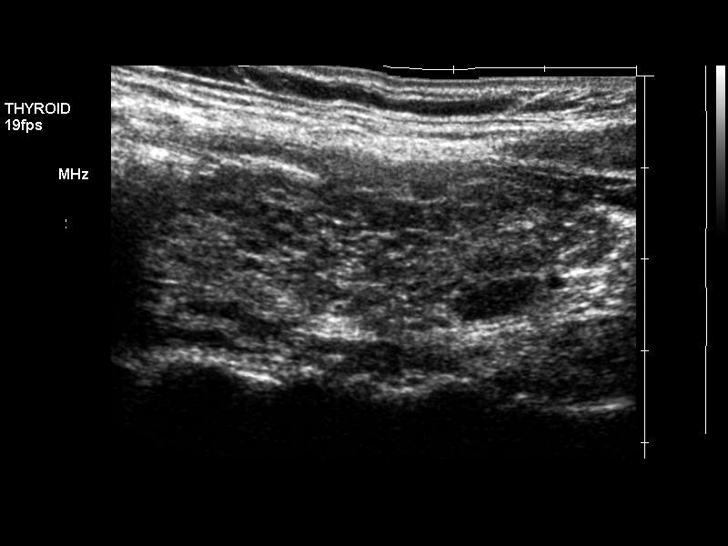
[im 19/31]
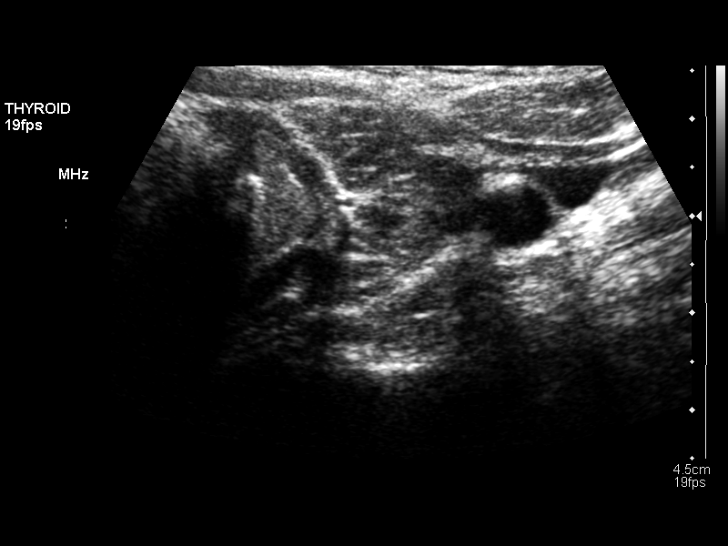
[im 21/31]
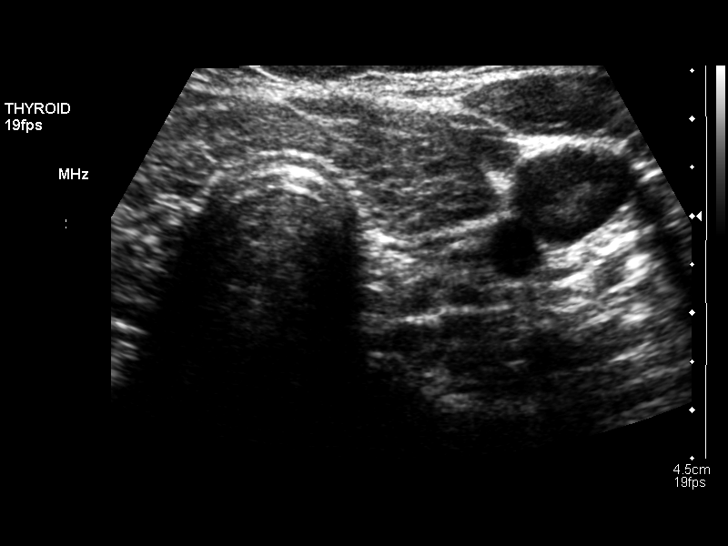
[im 23/31]
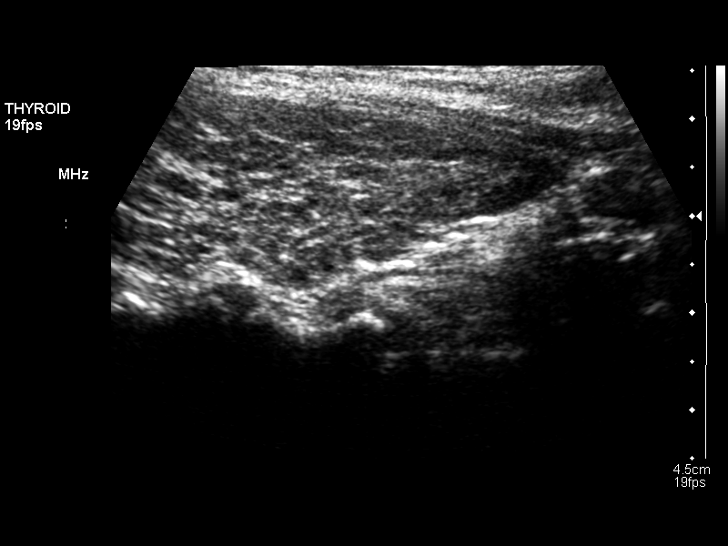
[im 26/31]
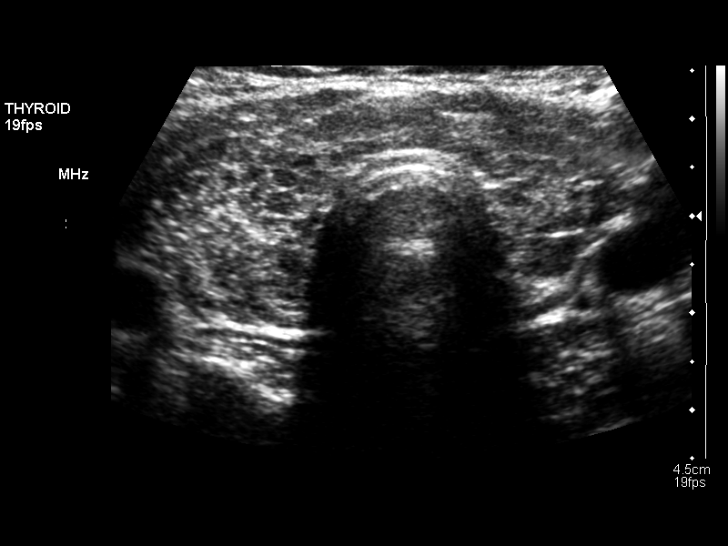
[im 28/31]
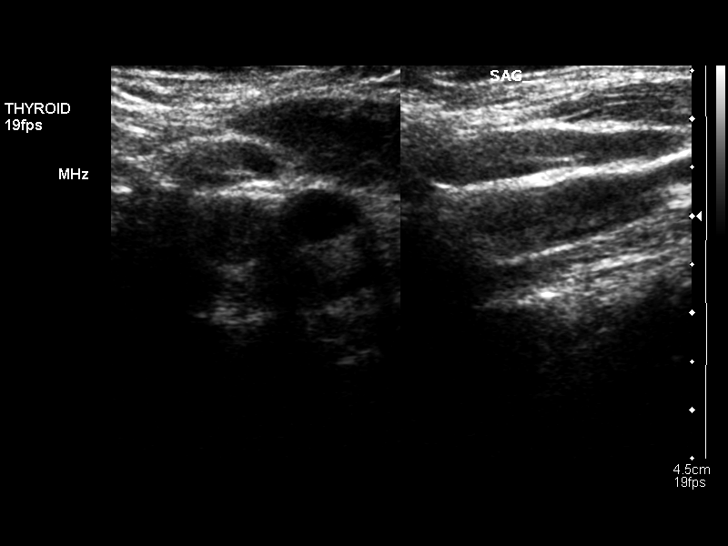
[im 31/31]
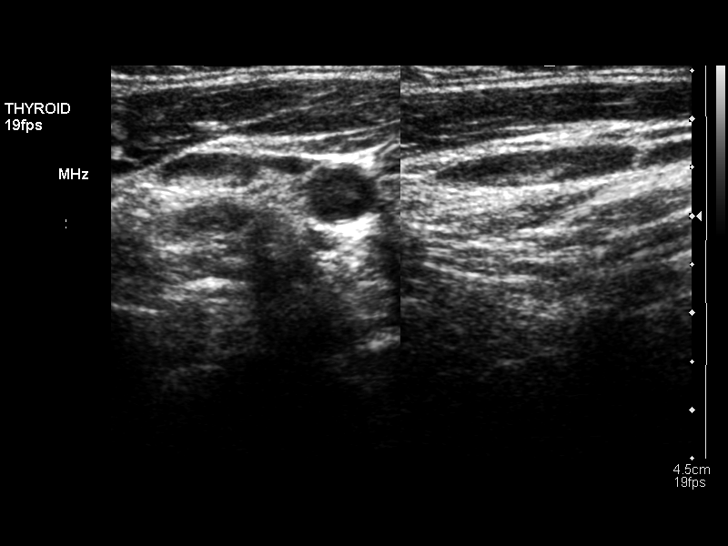

[14 of 25 positions shown; findings below may reference images not displayed]

FINDINGS: The gland remains diffusely heterogeneous in echotexture.
Approximate dimensions of the right lobe are 6.1 x 1.7 x 2.3 cm.
The left lobe measures 5.4 x 2.0 x 2.1 cm.  The isthmus measures 6
mm in thickness.

Thyroid nodularity appears stable.  There is no dominant nodule.

Small cervical lymph nodes are again noted bilaterally.  These have
a maximal short axis dimensions of the 6 mm on the left and 4 mm on
the right.  These retain fatty hila and appear physiologic.
IMPRESSION: 1.  Stable appearance of the thyroid gland with diffuse
heterogeneity compatible with multinodular goiter.
2.  No dominant thyroid nodule or other suspicious finding.

## 2008-06-20 ENCOUNTER — Encounter: Admission: RE | Admit: 2008-06-20 | Discharge: 2008-06-20 | Payer: Self-pay | Admitting: Endocrinology

## 2008-06-20 IMAGING — US US SOFT TISSUE HEAD/NECK
1 series · 13 of 25 positions shown · non-contrast
Comparison: [DATE]

CLINICAL DATA: Follow up goiter

THYROID ULTRASOUND
TECHNIQUE: Ultrasound examination of the thyroid gland and
adjacent soft tissues was performed.

[Series 1: us soft tissue head/neck · 0.09mm/px · 13 of 58 slices shown]
[im 1/58]
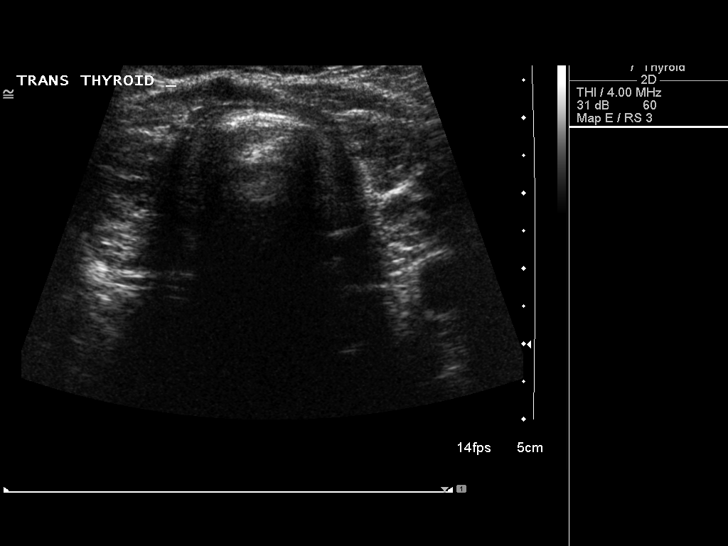
[im 5/58]
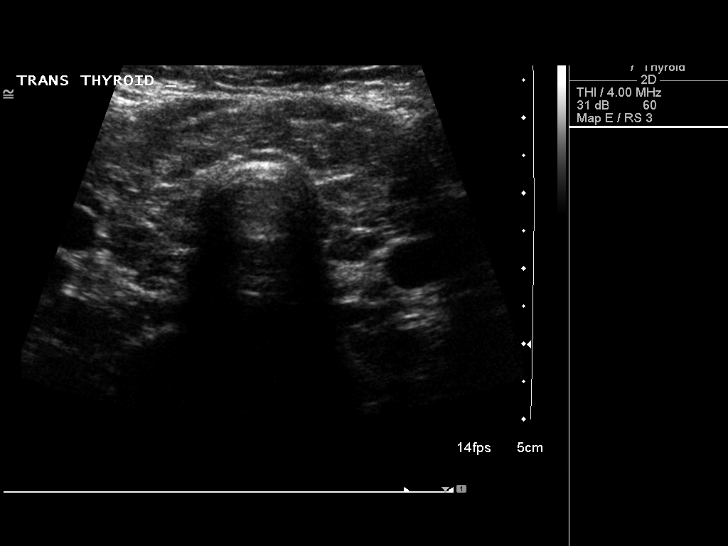
[im 10/58]
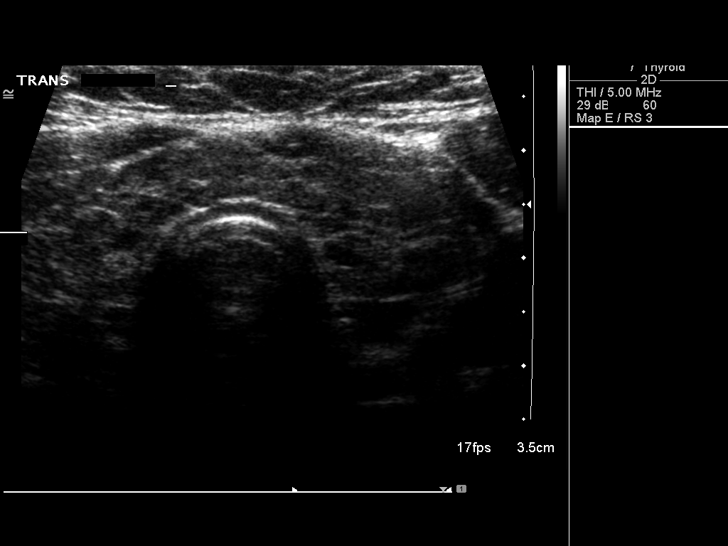
[im 15/58]
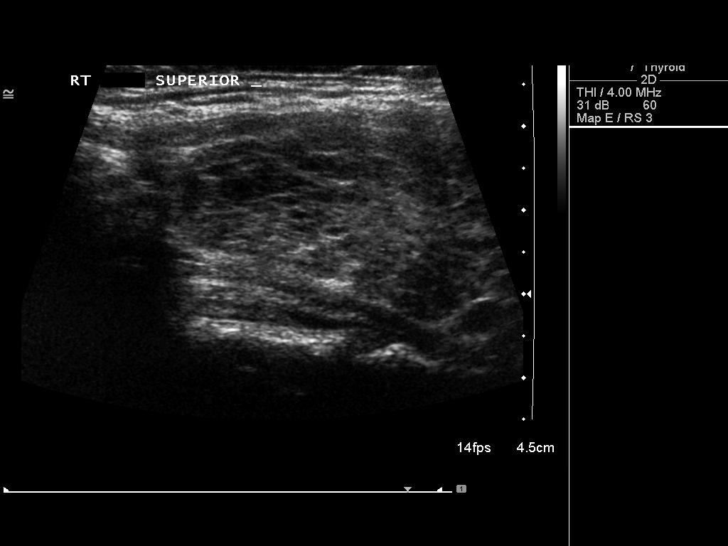
[im 20/58]
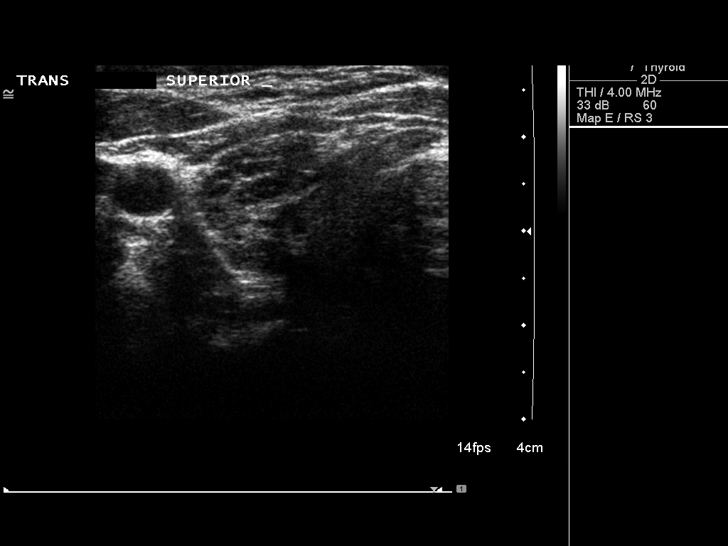
[im 24/58]
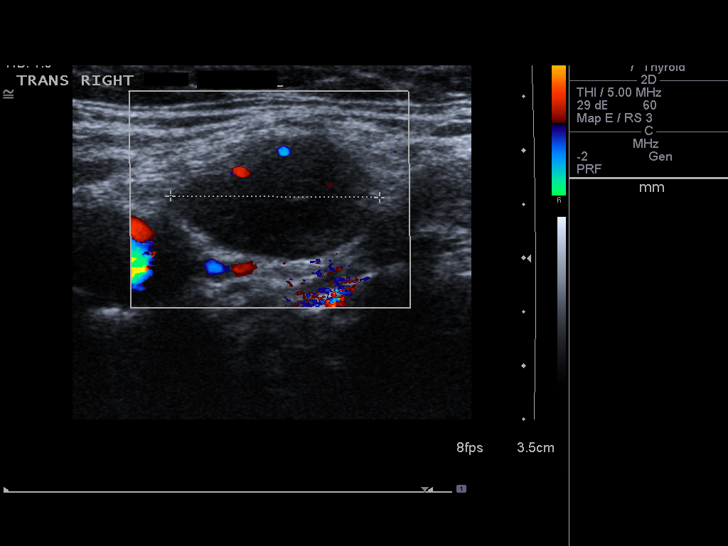
[im 29/58]
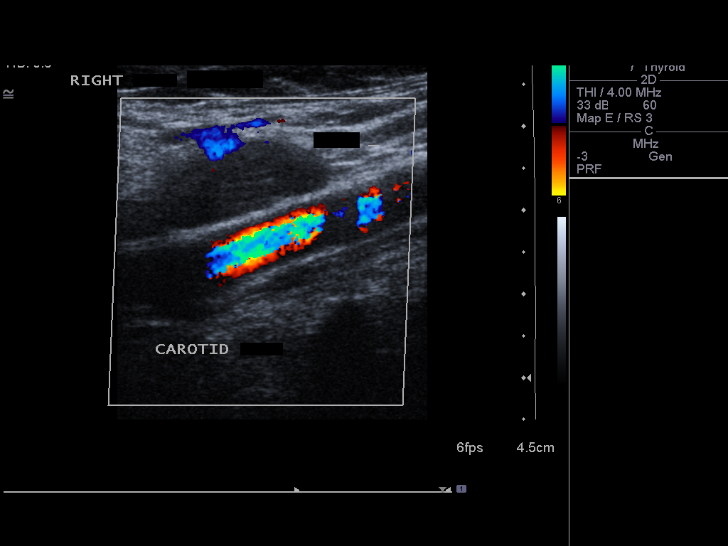
[im 34/58]
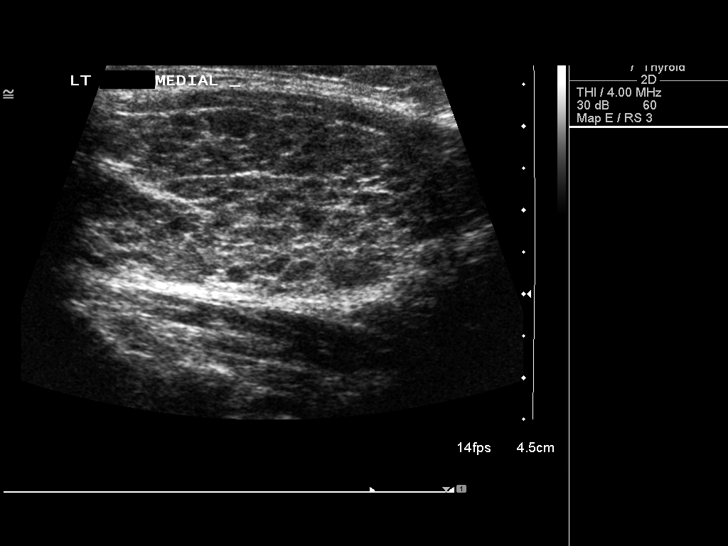
[im 39/58]
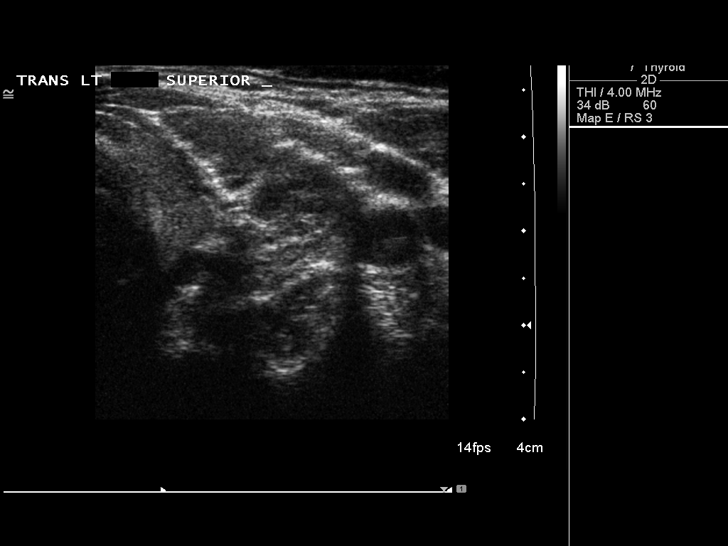
[im 43/58]
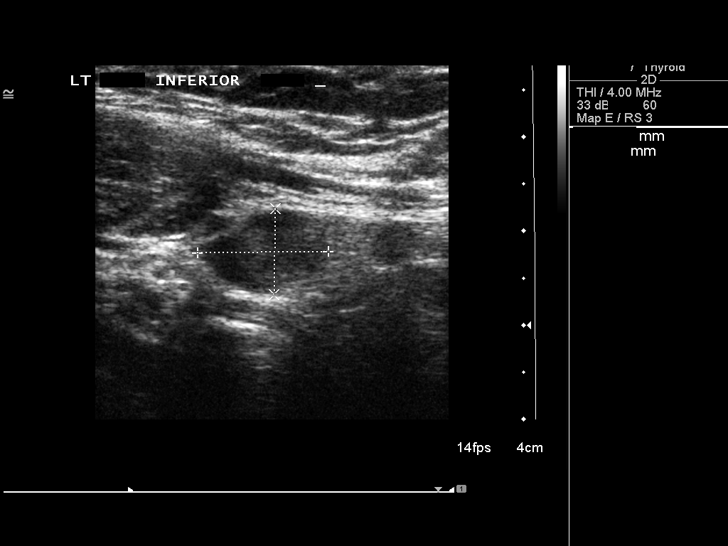
[im 48/58]
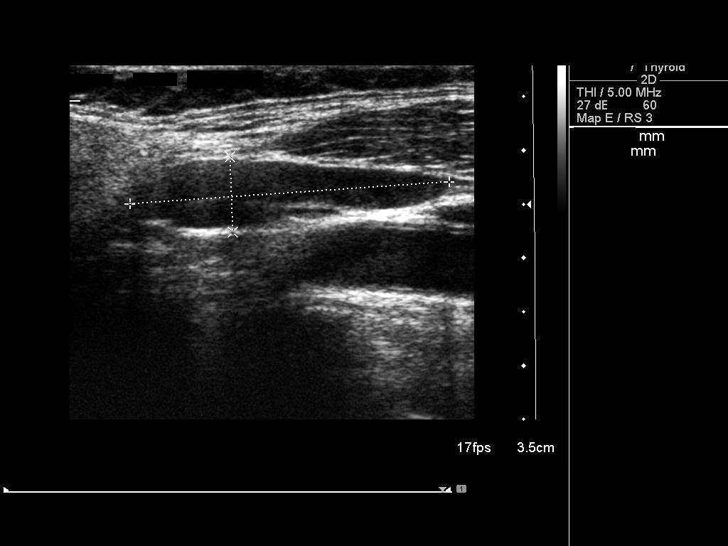
[im 53/58]
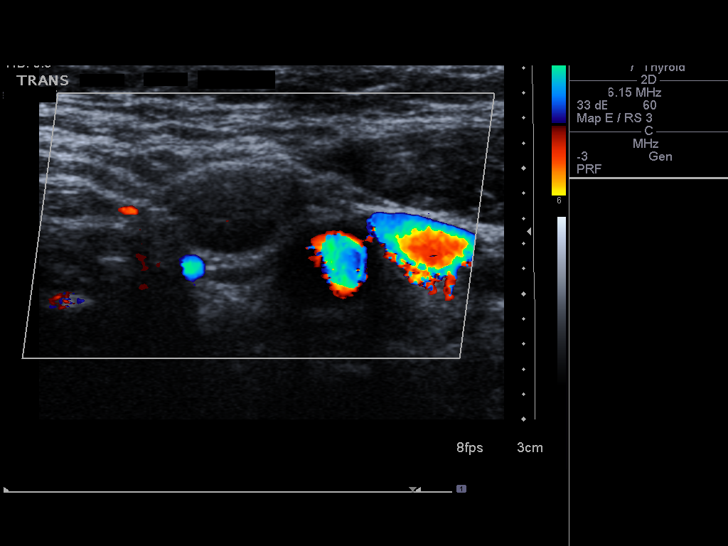
[im 58/58]
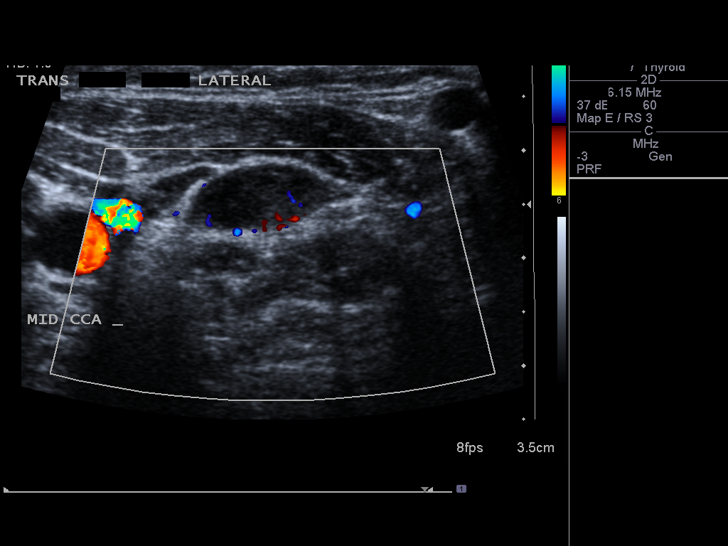

[13 of 25 positions shown; findings below may reference images not displayed]

FINDINGS: Right thyroid lobe measures 5.0 cm in length and 2.3 x
2.2 cm transversely.  Left lobe measures 5.7 cm in length and 1.6 x
2.1 cm transversely.  Thyroid isthmus measures 6.2 mm in thickness.
Thyroid gland is similar in size to that noted previously.  The
parenchymal echotexture  pattern is diffusely inhomogeneous.  There
is somewhat generalized ropy appearing pattern without distinct
well-defined nodules.  I would wonder if the pattern may be due to
a chronic thyroiditis such as MOOLMAN.

2.1 x 1.1 x 1.9 cm lymph node in the superior lateral aspect of the
right neck near the carotid artery.  3.0 x 0.7 x 1.3 cm node just
lateral to the left carotid artery in the superior aspect of the
neck. Inferolateral to that site there is a 3.4 x 0.6 x 1.4 cm node
and inferior to the inferior pole of the left thyroid lobe there is
a 1.3 x 0.9 x 1.0 cm lymph node.
IMPRESSION: Diffuse goiter.  Chronic parenchymal echotexture inhomogeneity.
Query if the findings may represent a thyroiditis such as
MOOLMAN.  No distinct thyroid nodules.  Enlarged lymph nodes.

## 2008-10-03 ENCOUNTER — Ambulatory Visit: Payer: Self-pay | Admitting: Family Medicine

## 2008-10-03 DIAGNOSIS — N644 Mastodynia: Secondary | ICD-10-CM | POA: Insufficient documentation

## 2008-10-03 DIAGNOSIS — M25519 Pain in unspecified shoulder: Secondary | ICD-10-CM | POA: Insufficient documentation

## 2008-10-09 ENCOUNTER — Encounter: Admission: RE | Admit: 2008-10-09 | Discharge: 2008-10-09 | Payer: Self-pay | Admitting: Family Medicine

## 2008-10-11 ENCOUNTER — Encounter: Admission: RE | Admit: 2008-10-11 | Discharge: 2008-10-11 | Payer: Self-pay | Admitting: Family Medicine

## 2008-12-18 IMAGING — CR DG SHOULDER 2+V*R*
3 series · 3 of 3 positions shown · non-contrast
Comparison: None

CLINICAL DATA: Chronic shoulder pain, no trauma

RIGHT SHOULDER - 2+ VIEW

[w shoulder ap internal righ]
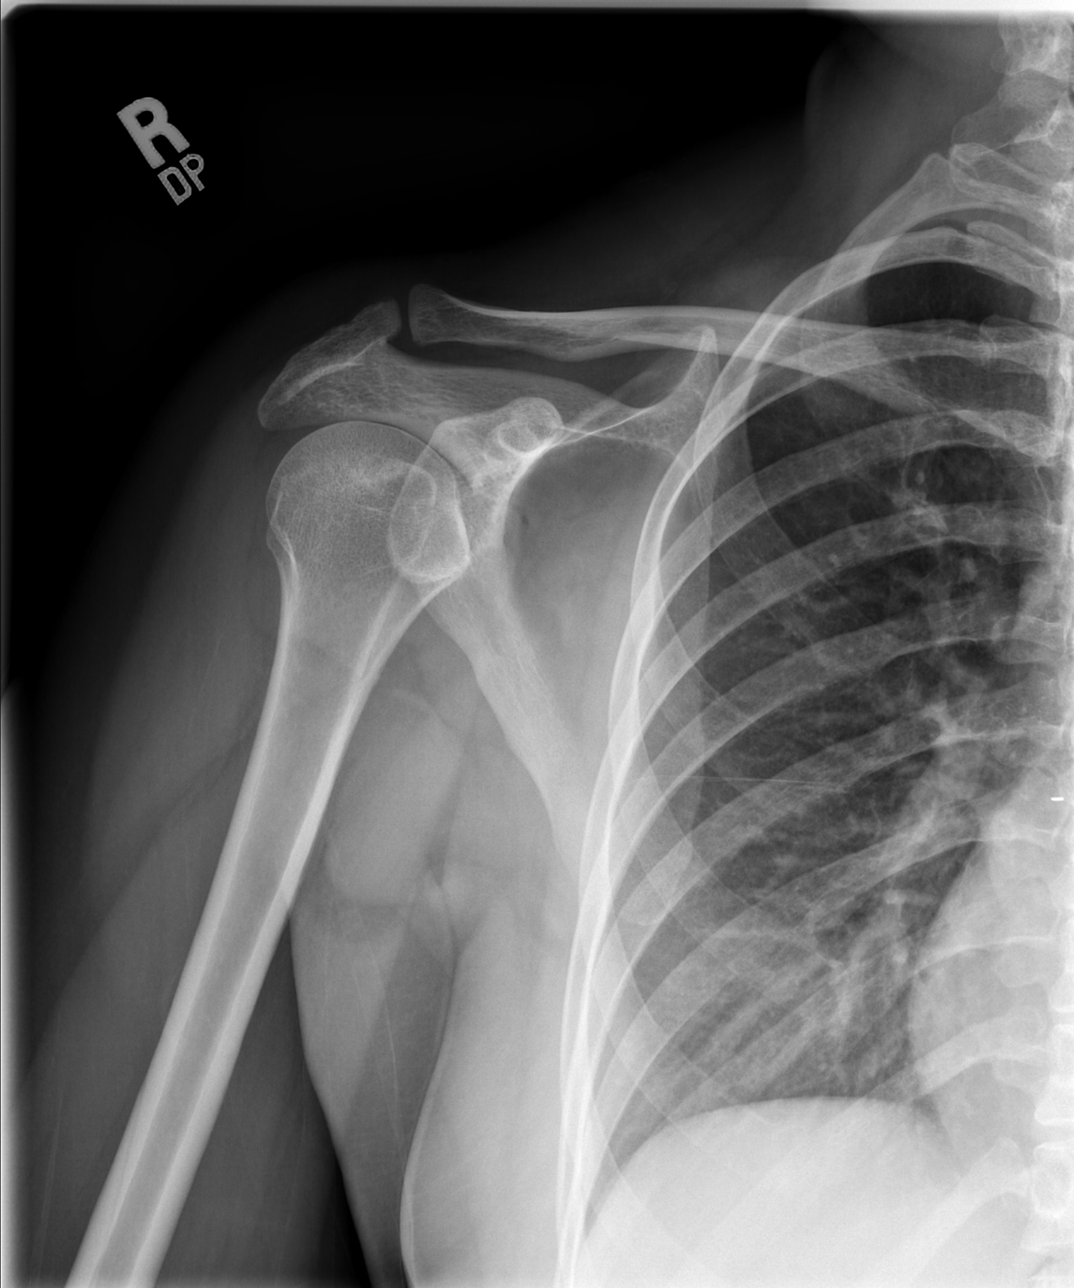

[w shoulder ap external righ]
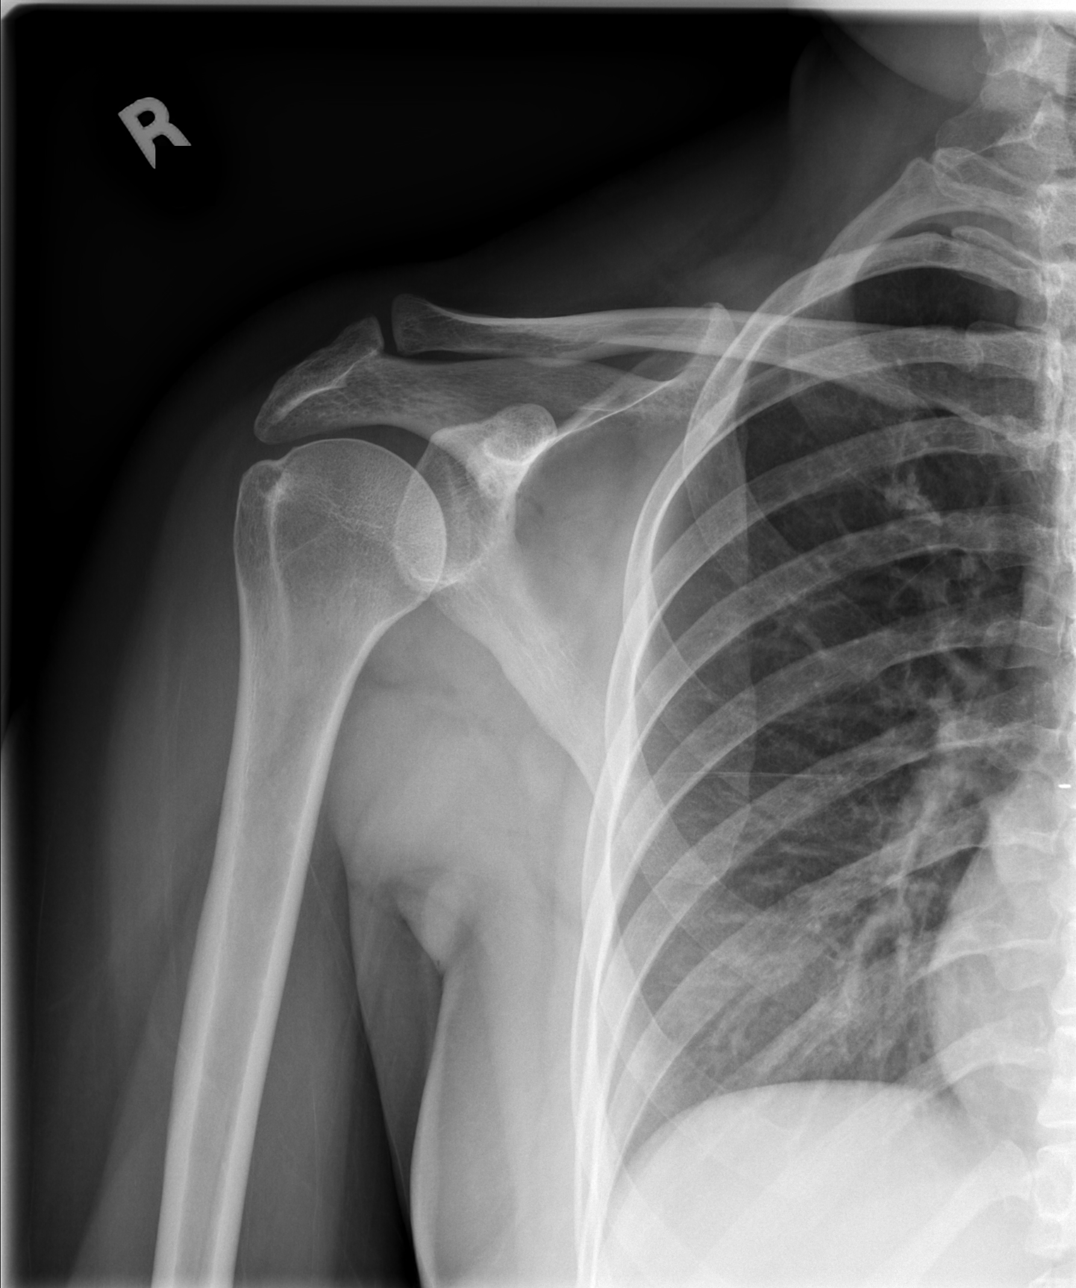

[w shoulder axillary right]
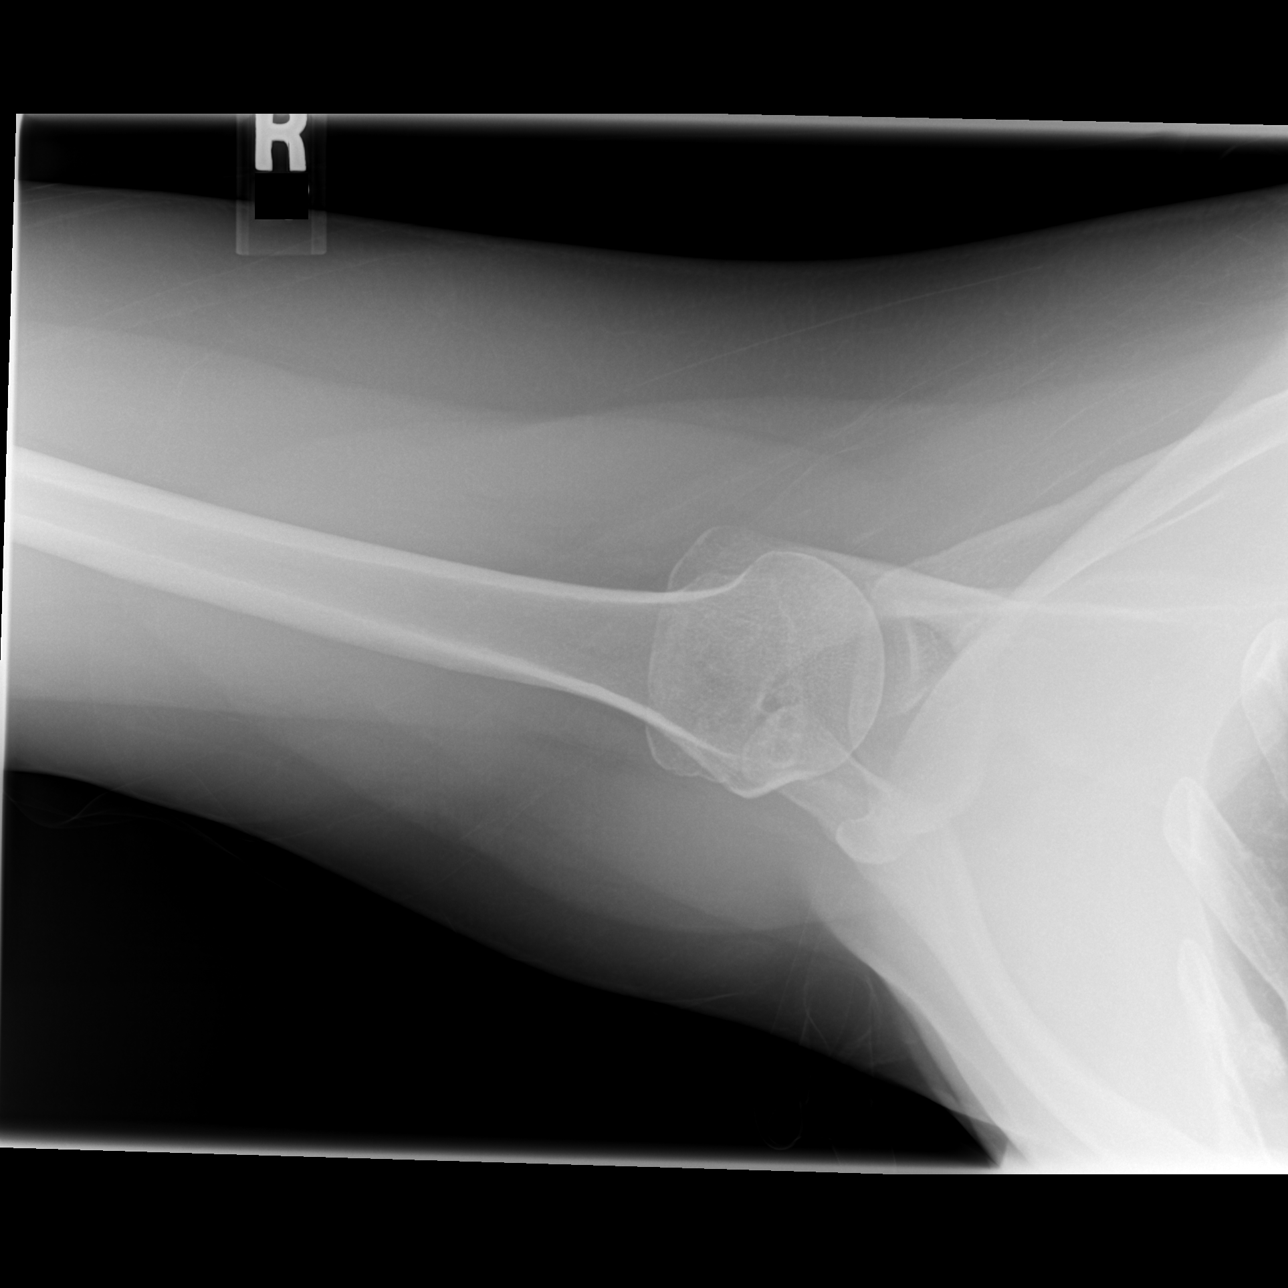

[3 of 3 positions shown; findings below may reference images not displayed]

FINDINGS: No acute bony abnormality is seen.  The right
glenohumeral joint space appears normal.  There is a downward
sloping acromion noted which can predispose to impingement.  The
right AC joint is normally aligned
IMPRESSION: No acute abnormality.  The downward sloping acromion may predispose
to impingement.

## 2009-04-04 ENCOUNTER — Encounter: Payer: Self-pay | Admitting: Family Medicine

## 2009-04-09 ENCOUNTER — Encounter: Admission: RE | Admit: 2009-04-09 | Discharge: 2009-04-09 | Payer: Self-pay | Admitting: Otolaryngology

## 2009-04-09 IMAGING — RF DG ESOPHAGUS
14 of 17 series · 19 of 24 positions shown · non-contrast
Comparison: None.

CLINICAL DATA: History of thyroiditis.  Fullness in the throat.

ESOPHOGRAM/BARIUM SWALLOW
TECHNIQUE: Combined double contrast and single contrast
examination performed using effervescent crystals, thick barium
liquid, and thin barium liquid.
Fluoroscopy time:  2.6 minutes.

[Series 1: run · 2 of 4 slices shown (1 of 14)]
[im 1/4]
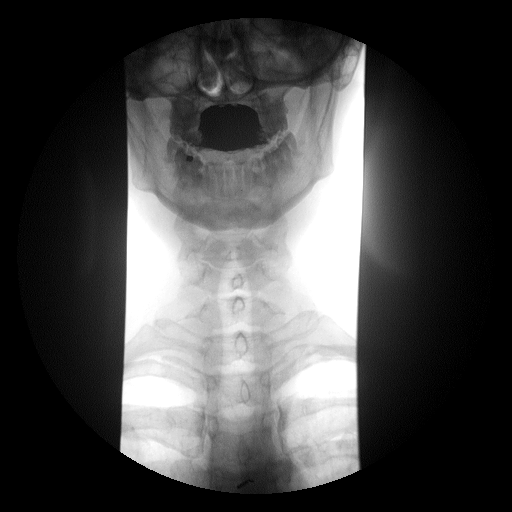
[im 2/4]
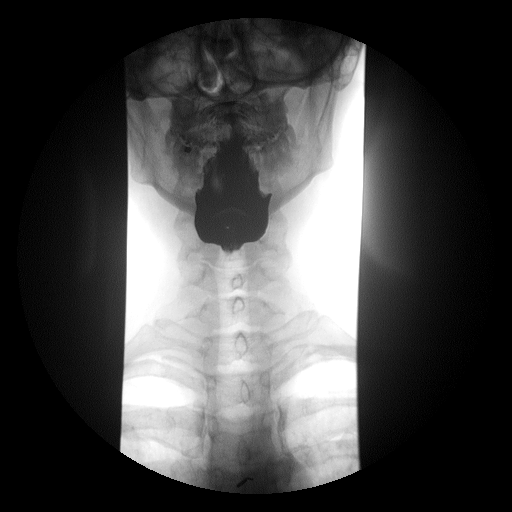

[Series 2: run · 5 of 6 slices shown (2 of 14)]
[im 1/6]
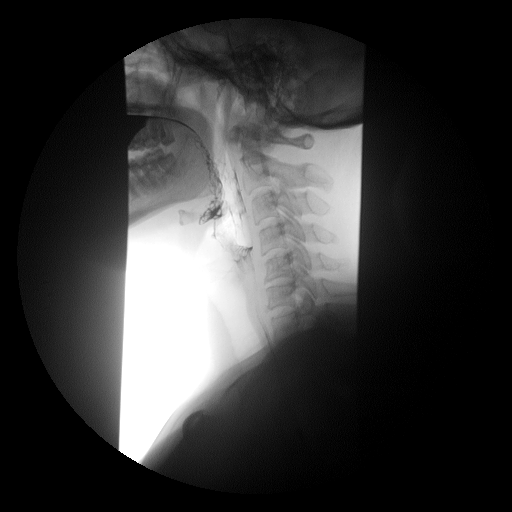
[im 2/6]
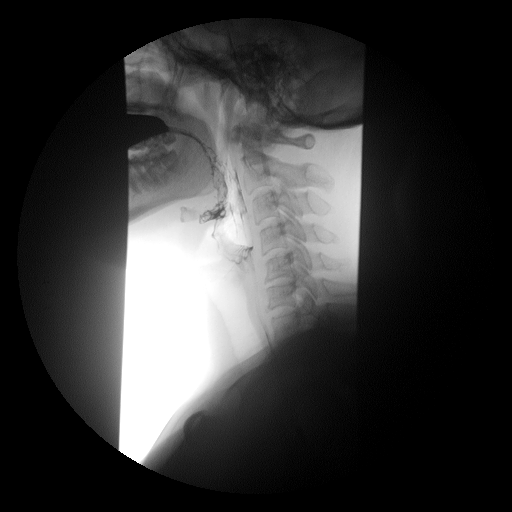
[im 3/6]
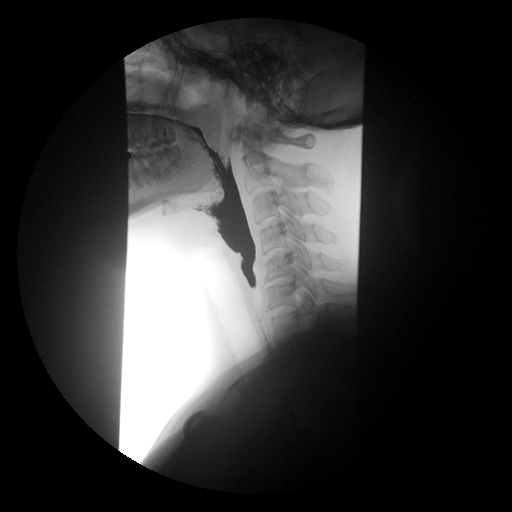
[im 4/6]
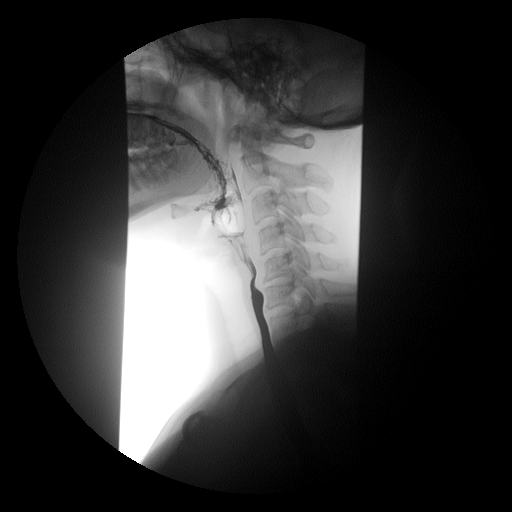
[im 6/6]
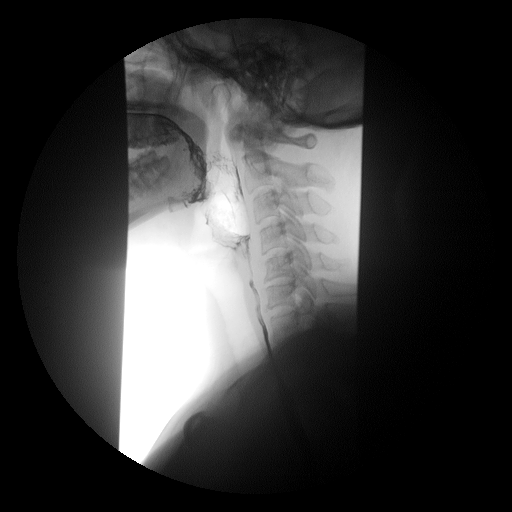

[Series 3: run · 1 of 1 slices shown (3 of 14)]
[im 1/1]
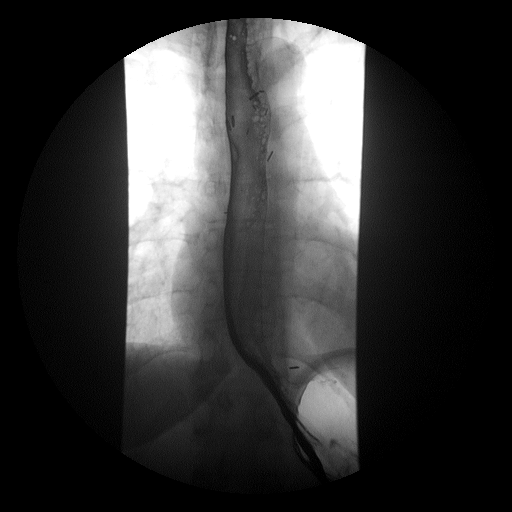

[Series 4: run · 1 of 1 slices shown (4 of 14)]
[im 1/1]
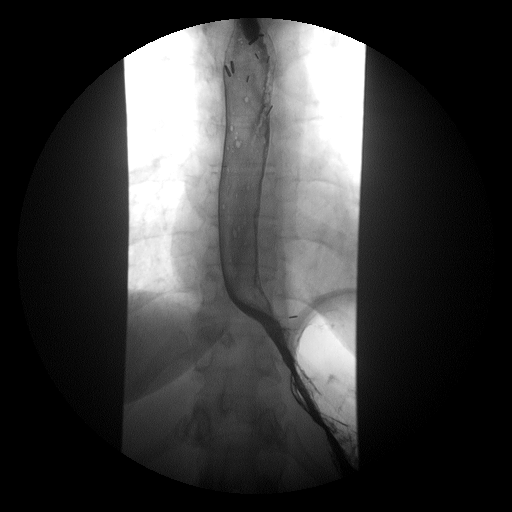

[Series 6: run · 1 of 1 slices shown (5 of 14)]
[im 1/1]
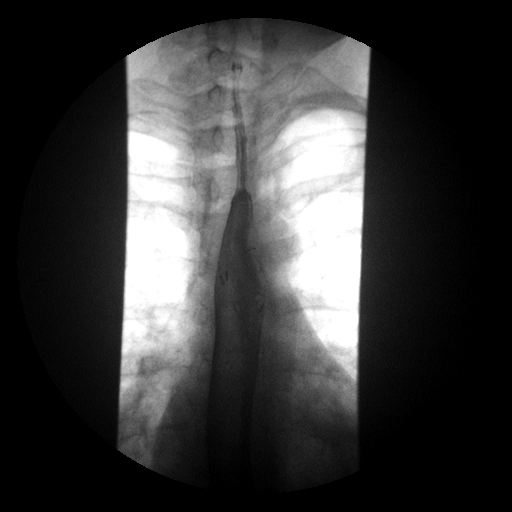

[Series 7: run · 1 of 1 slices shown (6 of 14)]
[im 1/1]
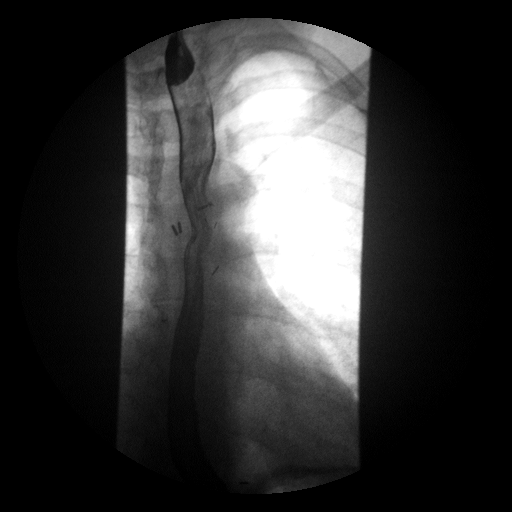

[Series 8: run · 1 of 1 slices shown (7 of 14)]
[im 1/1]
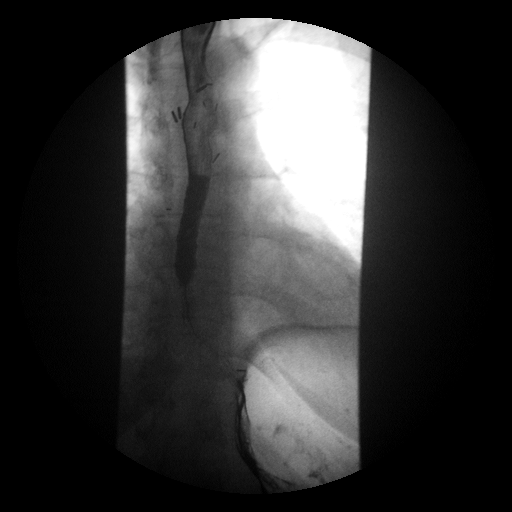

[Series 9: run · 1 of 1 slices shown (8 of 14)]
[im 1/1]
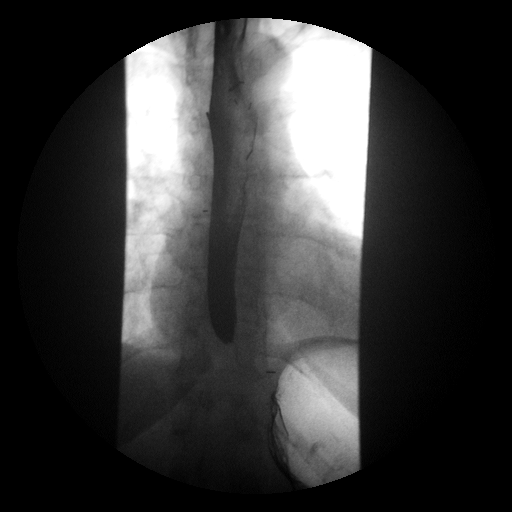

[Series 11: run · 1 of 1 slices shown (9 of 14)]
[im 1/1]
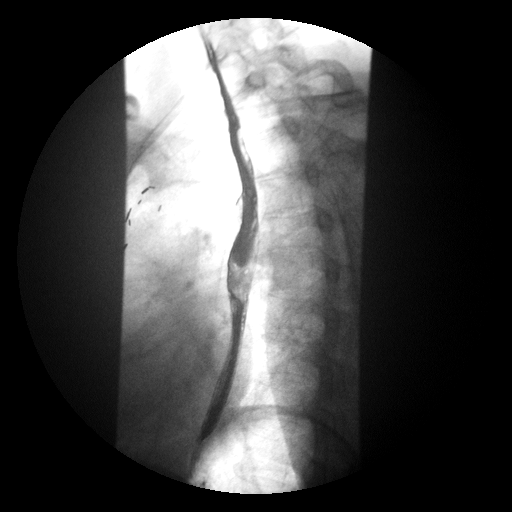

[Series 12: run · 1 of 1 slices shown (10 of 14)]
[im 1/1]
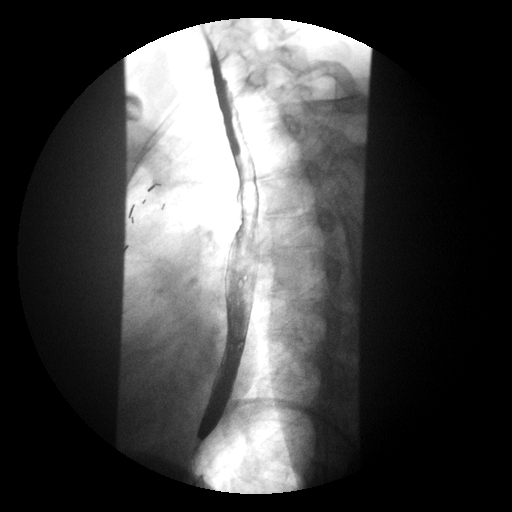

[Series 13: run · 1 of 1 slices shown (11 of 14)]
[im 1/1]
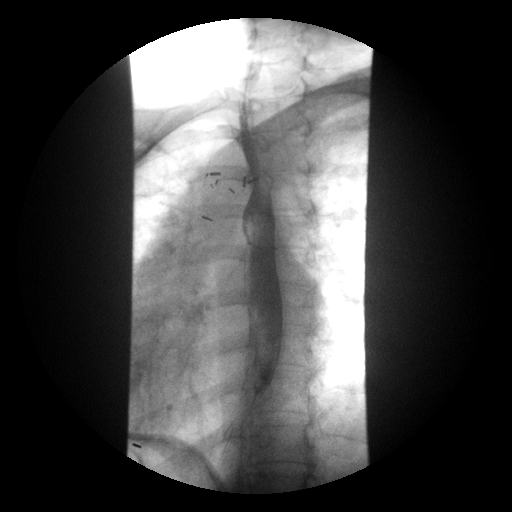

[Series 14: run · 1 of 1 slices shown (12 of 14)]
[im 1/1]
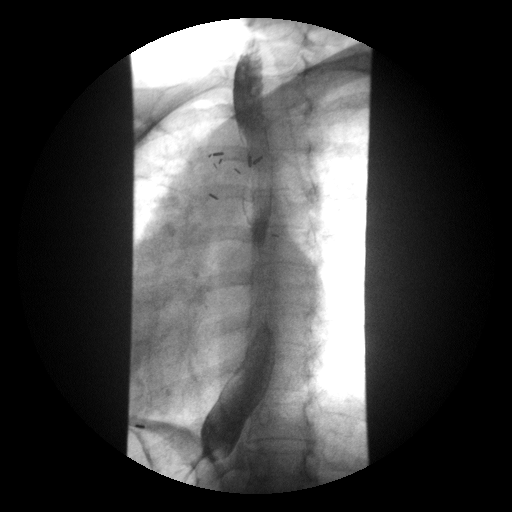

[Series 16: run · 1 of 1 slices shown (13 of 14)]
[im 1/1]
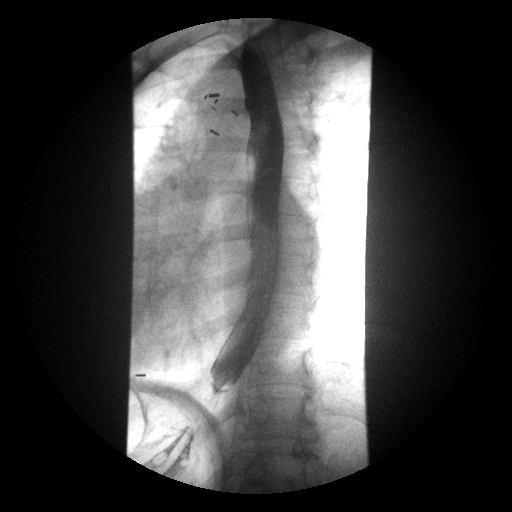

[Series 17: run · 1 of 1 slices shown (14 of 14)]
[im 1/1]
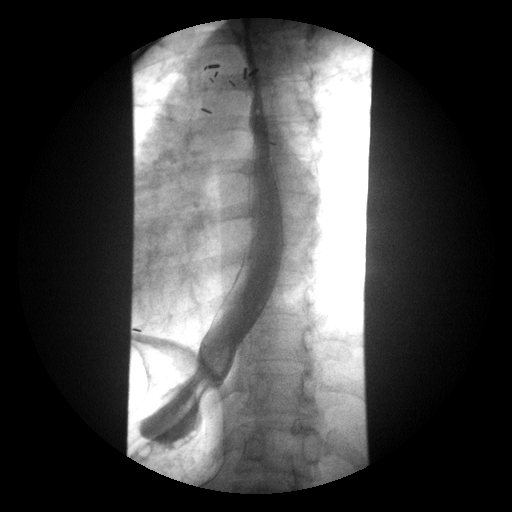

[19 of 24 positions shown; findings below may reference images not displayed]

FINDINGS: The frontal and lateral views of the hypopharynx while
swallowing are normal.  Double contrast imaging of the esophagus is
normal, without evidence for esophageal stricture, ulcer,
diverticulum, or mass.  Esophageal motility is normal.  There is no
evidence for hiatal hernia.  13 mm barium tablet passes readily
into the stomach when taken with water.  Multiple surgical clips
are seen in the mediastinum.
IMPRESSION: Normal double contrast barium esophagram.

## 2009-04-09 IMAGING — US US SOFT TISSUE HEAD/NECK
1 series · 14 of 25 positions shown · non-contrast
Comparison: [DATE]

CLINICAL DATA: Increasing cervical dysphagia from thyroiditis.

THYROID ULTRASOUND
TECHNIQUE: Ultrasound examination of the thyroid gland and
adjacent soft tissues was performed.

[Series 1: us soft tissue head/neck · 0.08mm/px · 14 of 31 slices shown]
[im 1/31]
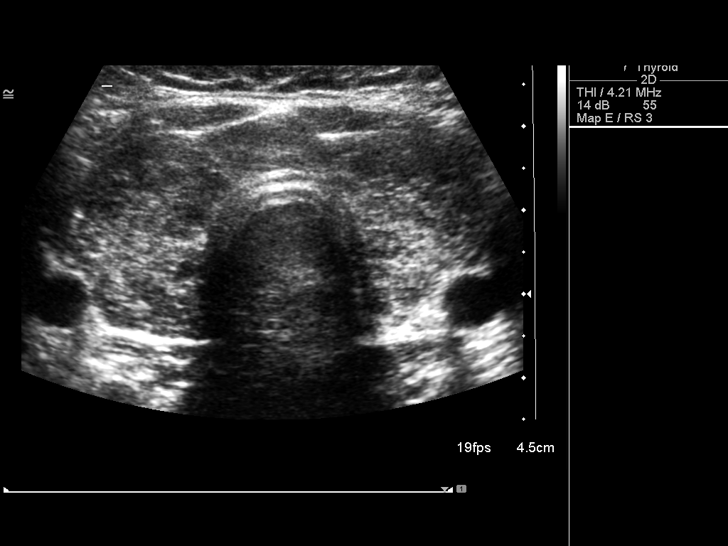
[im 3/31]
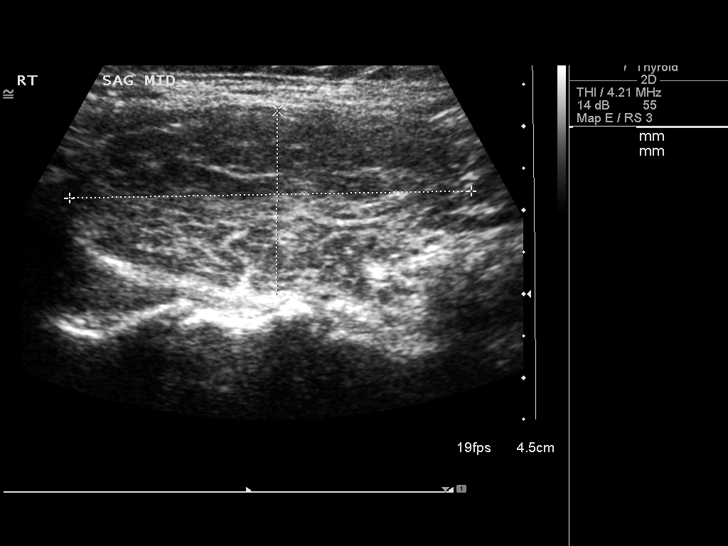
[im 6/31]
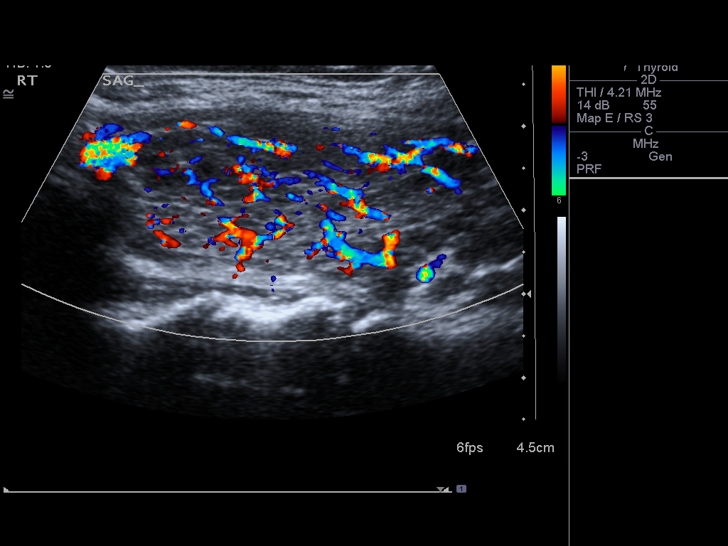
[im 8/31]
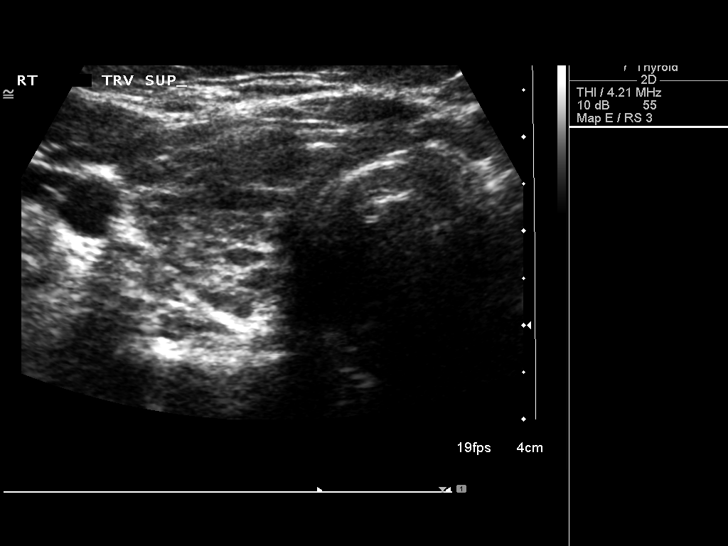
[im 11/31]
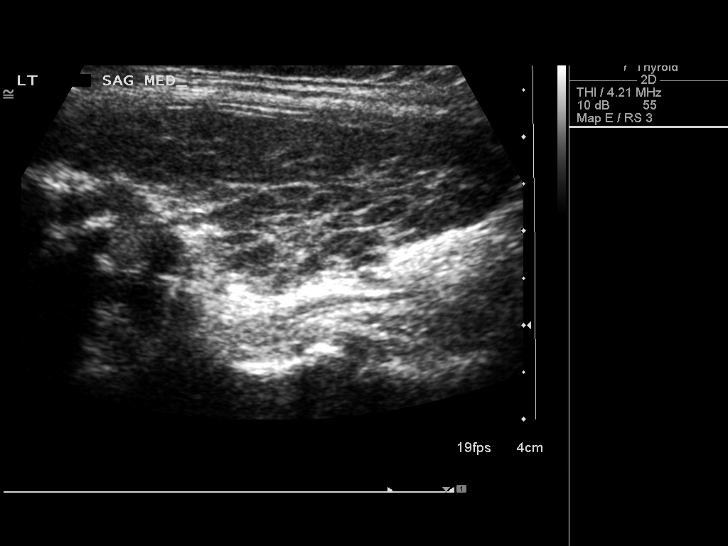
[im 12/31]
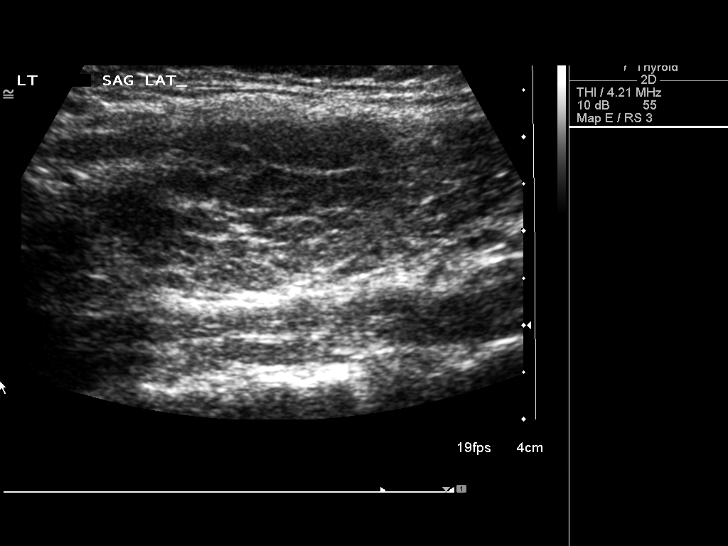
[im 14/31]
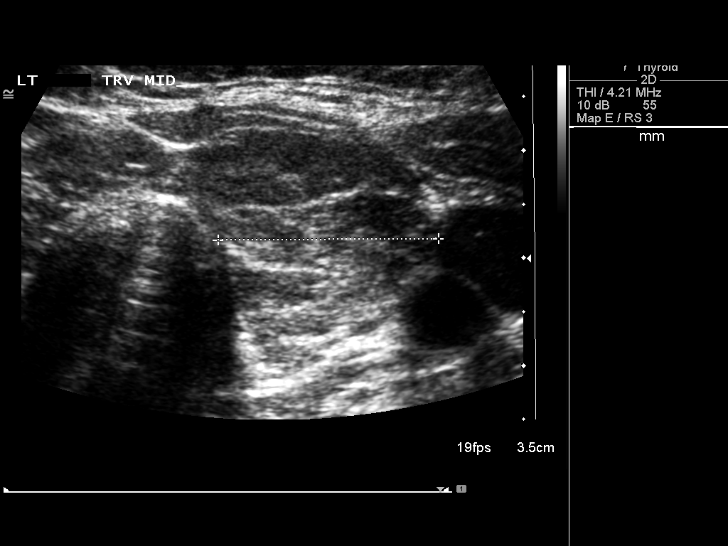
[im 17/31]
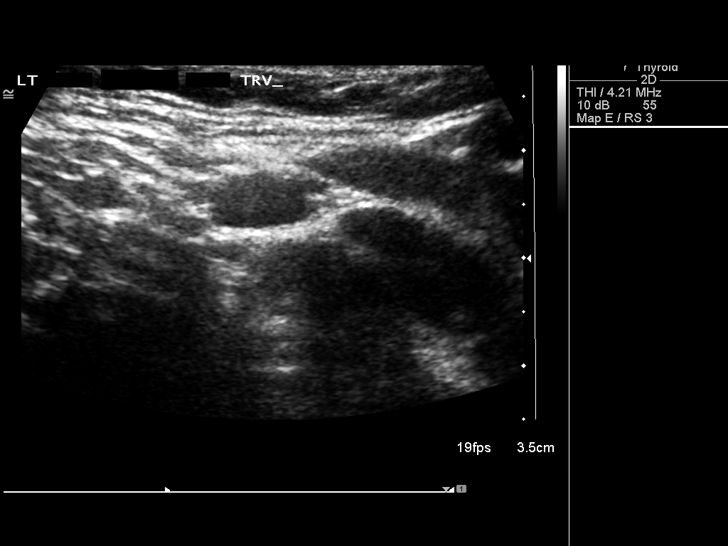
[im 19/31]
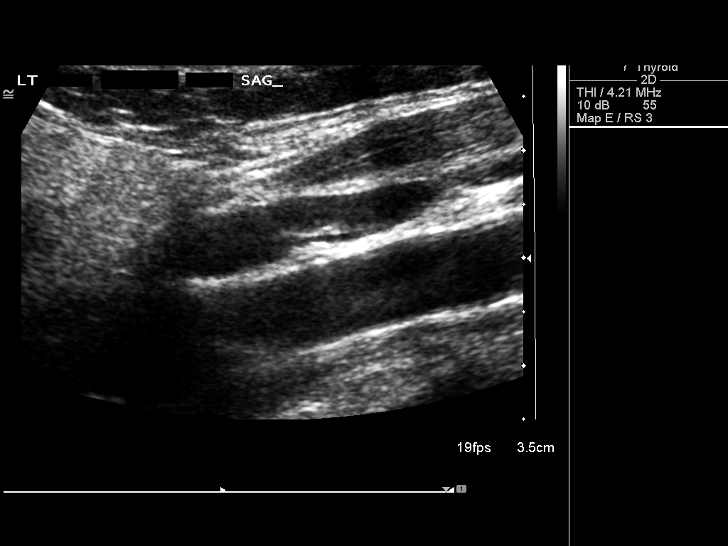
[im 21/31]
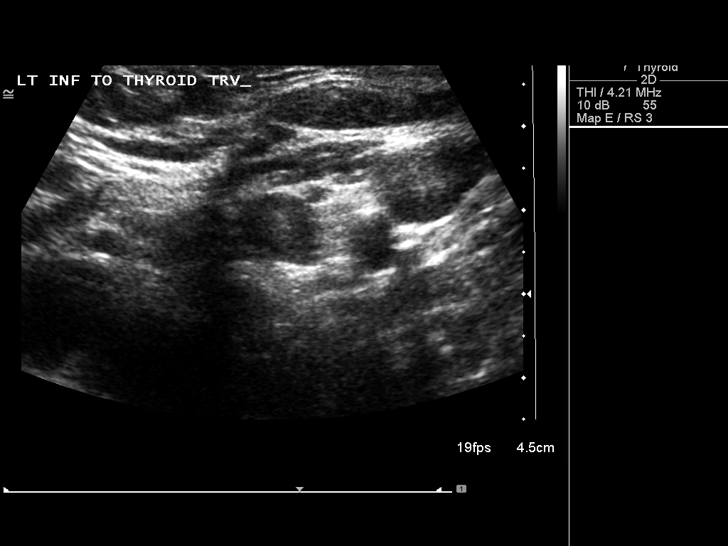
[im 23/31]
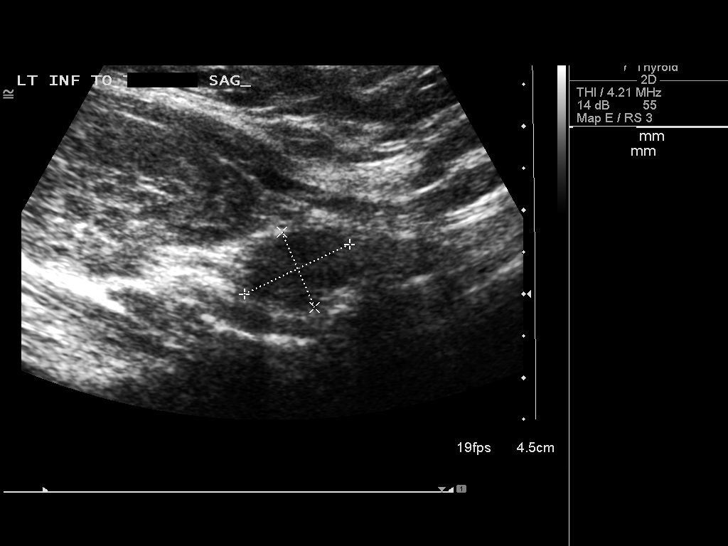
[im 26/31]
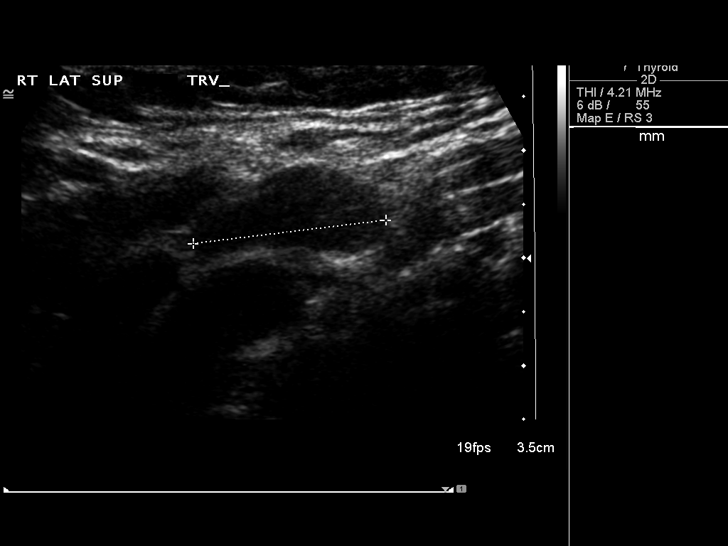
[im 28/31]
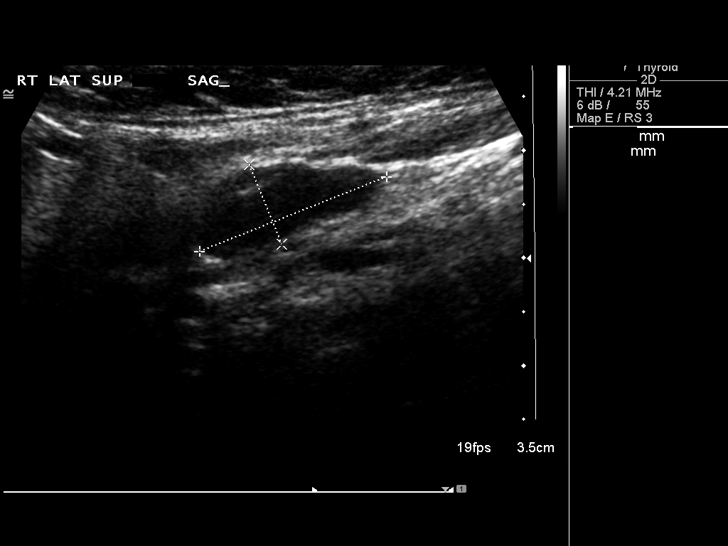
[im 31/31]
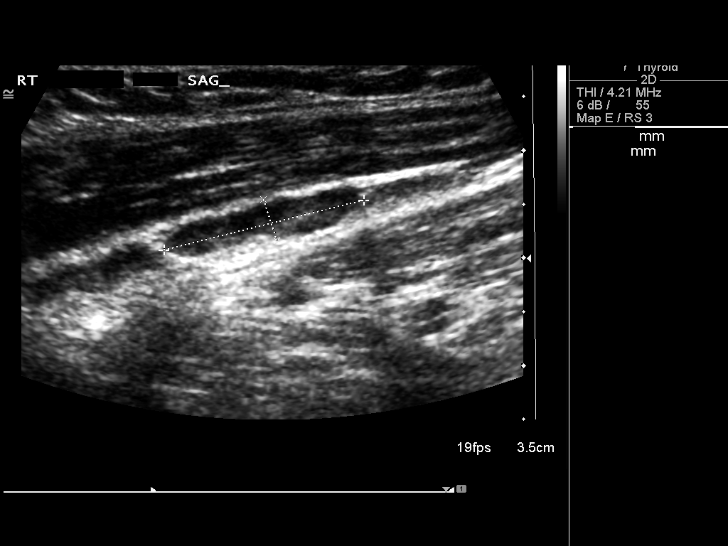

[14 of 25 positions shown; findings below may reference images not displayed]

FINDINGS: Right lobe of the thyroid measures 4.8 x 2.2 x 2.1 cm and
left lobe, 5.5 x 1.7 x 2.1 cm.  Isthmus measures 7 mm.  Thyroid
echotexture is heterogeneous.  There are no focal lesions contained
within.  Lymph nodes are again seen in the neck bilaterally,
measuring up to 1.8 x 1.9 x 0.8 cm on the right.
IMPRESSION: 1.  No focal thyroid lesions.
2.  Bilateral lymph nodes.

## 2010-02-11 ENCOUNTER — Encounter: Payer: Self-pay | Admitting: Family Medicine

## 2010-08-04 NOTE — Assessment & Plan Note (Signed)
Summary: TENDERNESS AND SWELLING IN RIGHT BREAST & PAIN IN RIGHT ARM /...   Vital Signs:  Patient profile:   40 year old female Height:      63 inches Weight:      177 pounds BMI:     31.47 Temp:     97.8 degrees F Pulse rate:   63 / minute BP supine:   110 / 80  (left arm)  History of Present Illness: tenderness along R ext breast  swelling in that area pain in shoulder anterior and rad down her arm  does not feel any lumps  no skin change or nipple d/c  no change in bra or trauma or falls   no hx of breast lumps never had mam   has had hx of shoulder surg for spurs in L in past this pain feels different   no fam hx of breast problems  last pap about a year ago  had ablation 1 year ago   Past History:  Family History:    GF with lung cancer     GM stomach ca?    2 sisters with precancerous cells of cervix  (10/03/2008)  Past Surgical History:    uterine ablation for heavy menses     shoulder surg L - bone spurs   Family History:    GF with lung cancer     GM stomach ca?    2 sisters with precancerous cells of cervix   Review of Systems General:  Denies chills, fatigue, fever, loss of appetite, and malaise. Eyes:  Denies blurring. CV:  Denies chest pain or discomfort, palpitations, swelling of feet, and swelling of hands. Resp:  Denies cough and wheezing. GI:  Denies abdominal pain, bloody stools, and change in bowel habits. GU:  Denies abnormal vaginal bleeding. MS:  Complains of stiffness; denies cramps and muscle weakness. Derm:  Denies itching, lesion(s), and rash. Neuro:  Denies numbness and tingling. Psych:  no new stress . Endo:  Denies cold intolerance, heat intolerance, and weight change.  Physical Exam  General:  Well-developed,well-nourished,in no acute distress; alert,appropriate and cooperative throughout examination Head:  normocephalic, atraumatic, and no abnormalities observed.   Eyes:  vision grossly intact, pupils equal, pupils round,  and pupils reactive to light.   Mouth:  pharynx pink and moist.   Neck:  supple with full rom and no masses or thyromegally, no JVD or carotid bruit  nl rom with no crepitice  Chest Wall:  No deformities, masses, or tenderness noted. Breasts:  No mass, nodules, thickening,  bulging, retraction, inflamation, nipple discharge or skin changes noted.   no swelling noted  very mild tenderness in upper/outer quad of breast  Lungs:  Normal respiratory effort, chest expands symmetrically. Lungs are clear to auscultation, no crackles or wheezes. Heart:  Normal rate and regular rhythm. S1 and S2 normal without gallop, murmur, click, rub or other extra sounds. Abdomen:  no murphy sign soft, non-tender, normal bowel sounds, no distention, no masses, no hepatomegaly, and no splenomegaly.   Msk:  R shouder- pain on abd over 90 deg and on internal rotation no acromial or other bony tenderness no crepitice or swelling Pulses:  UEs normally perfused  Extremities:  No clubbing, cyanosis, edema, or deformity noted with normal full range of motion of all joints.   Neurologic:  strength normal in all extremities, gait normal, and DTRs symmetrical and normal.   Skin:  Intact without suspicious lesions or rashes Cervical Nodes:  No lymphadenopathy noted  Axillary Nodes:  No palpable lymphadenopathy Psych:  normal affect, talkative and pleasant    Impression & Recommendations:  Problem # 1:  SHOULDER PAIN, RIGHT (ICD-719.41) Assessment New positional , without neurol s/s ref for shoulder xray ? if this is also the cause of breast discomfort could not appreciate swelling on exam  adv to use heat , gentle rom, rel rest  Orders: Radiology Referral (Radiology)  Problem # 2:  BREAST PAIN, RIGHT (ICD-611.71) Assessment: New with subjective swelling - I could not appreciate today ref for [place] of breast/axilla- and update  enc self exams caffiene avoidance  Orders: Radiology Referral (Radiology)  Patient  Instructions: 1)  otc anti inflammatory is ok for pain  2)  warm compressed as needed  3)  we will set up ultrasound and shoulder x ray at check out

## 2010-08-04 NOTE — Consult Note (Signed)
Summary: [facility]  [facility]   Imported By: Maryln Gottron 02/17/2010 11:17:46  _____________________________________________________________________  External Attachment:    Type:   Image     Comment:   External Document

## 2010-08-04 NOTE — Consult Note (Signed)
Summary: [facility]  [facility]   Imported By: Lanelle Bal 04/09/2009 12:18:59  _____________________________________________________________________  External Attachment:    Type:   Image     Comment:   External Document

## 2010-08-04 NOTE — Letter (Signed)
Summary: [facility]  [facility]   Imported By: Maryln Gottron 04/11/2009 15:08:08  _____________________________________________________________________  External Attachment:    Type:   Image     Comment:   External Document

## 2010-10-28 ENCOUNTER — Other Ambulatory Visit: Payer: Self-pay | Admitting: Endocrinology

## 2010-10-28 DIAGNOSIS — E049 Nontoxic goiter, unspecified: Secondary | ICD-10-CM

## 2010-11-09 ENCOUNTER — Ambulatory Visit
Admission: RE | Admit: 2010-11-09 | Discharge: 2010-11-09 | Disposition: A | Discharge status: Home / Self Care | Payer: BC Managed Care – PPO | Source: Ambulatory Visit | Attending: Endocrinology | Admitting: Endocrinology

## 2010-11-09 DIAGNOSIS — E049 Nontoxic goiter, unspecified: Secondary | ICD-10-CM

## 2010-11-09 IMAGING — US US SOFT TISSUE HEAD/NECK
1 series · 14 of 25 positions shown · non-contrast
Comparison: Ultrasound of the thyroid of [DATE]

CLINICAL DATA: Follow up of thyroid goiter, the patient is on
Synthroid

THYROID ULTRASOUND
TECHNIQUE: Ultrasound examination of the thyroid gland and adjacent
soft tissues was performed.

[Series 1: us soft tissue head/neck · 0.08mm/px · 14 of 50 slices shown]
[im 1/50]
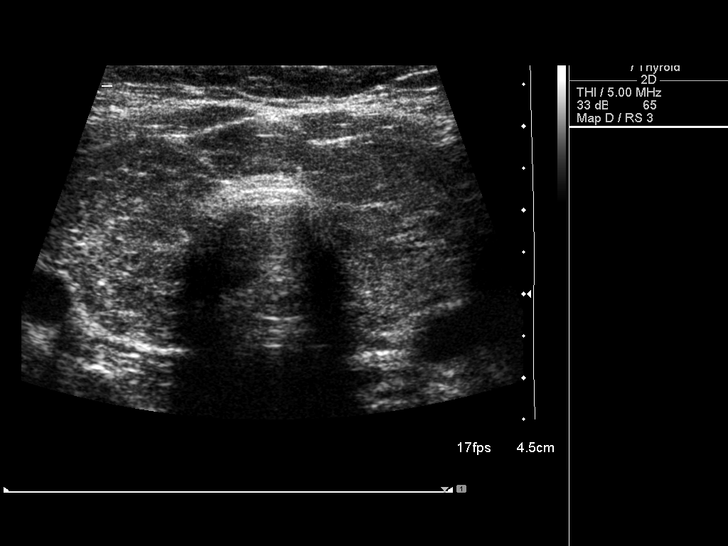
[im 5/50]
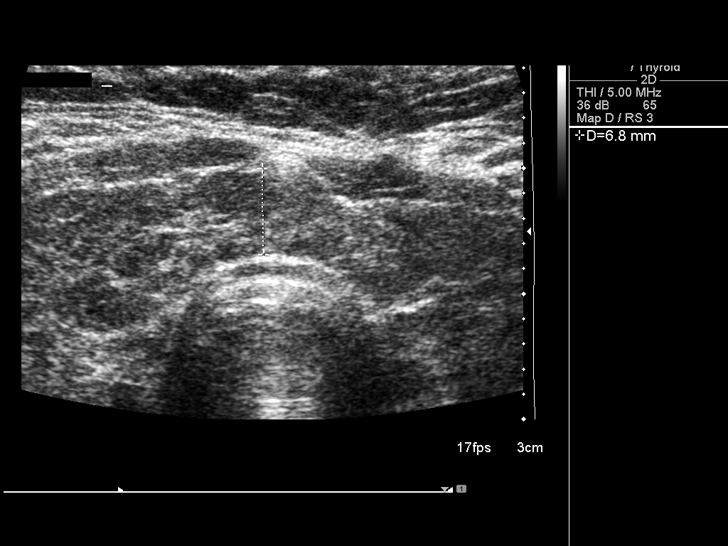
[im 9/50]
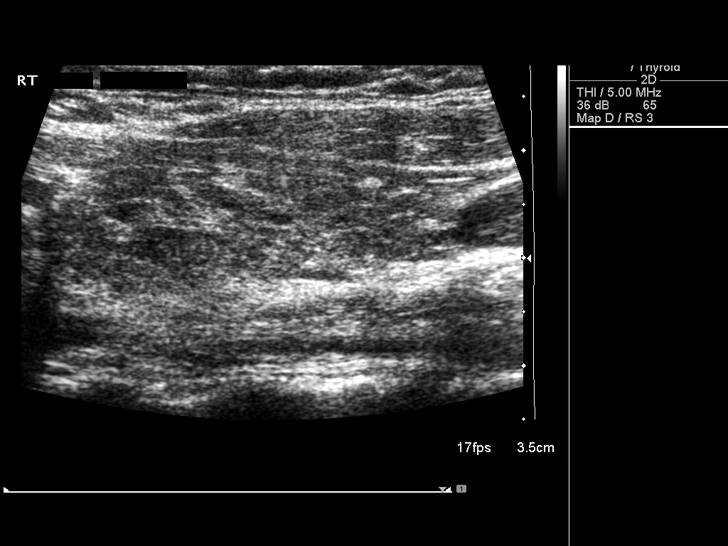
[im 13/50]
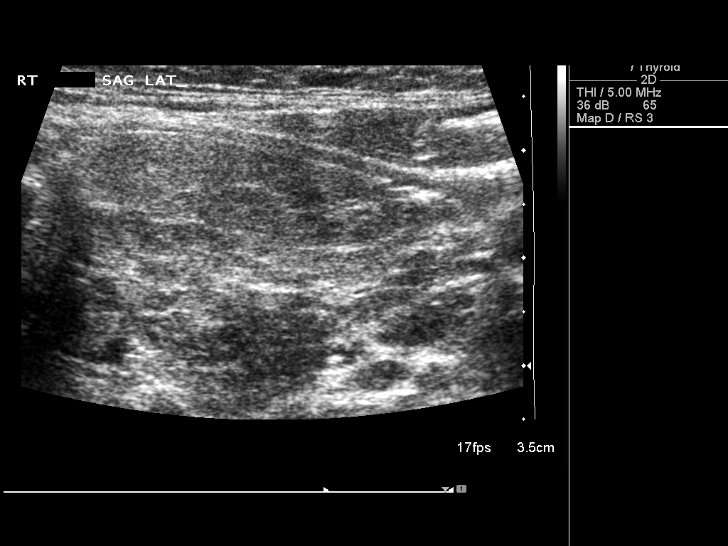
[im 17/50]
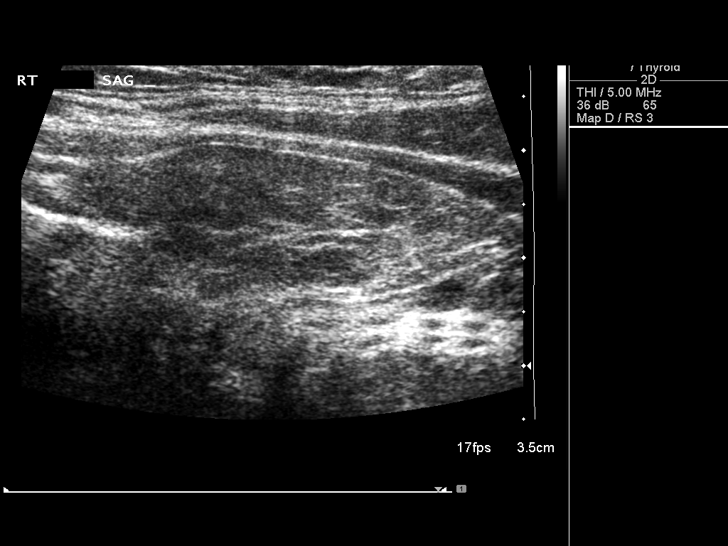
[im 19/50]
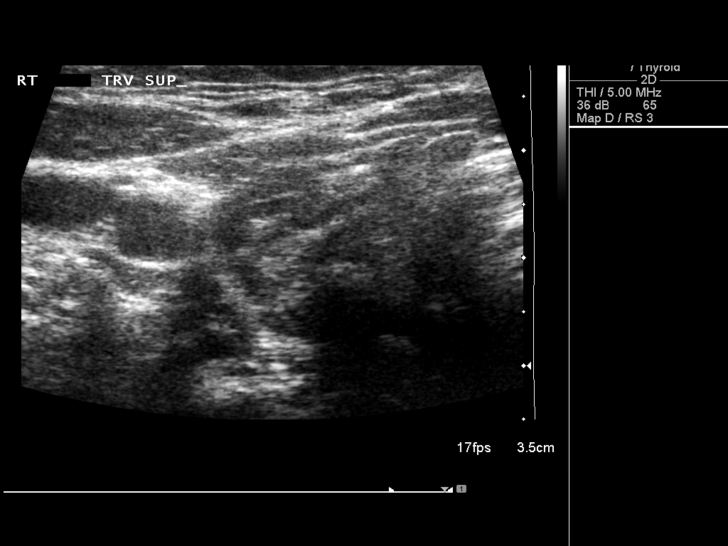
[im 23/50]
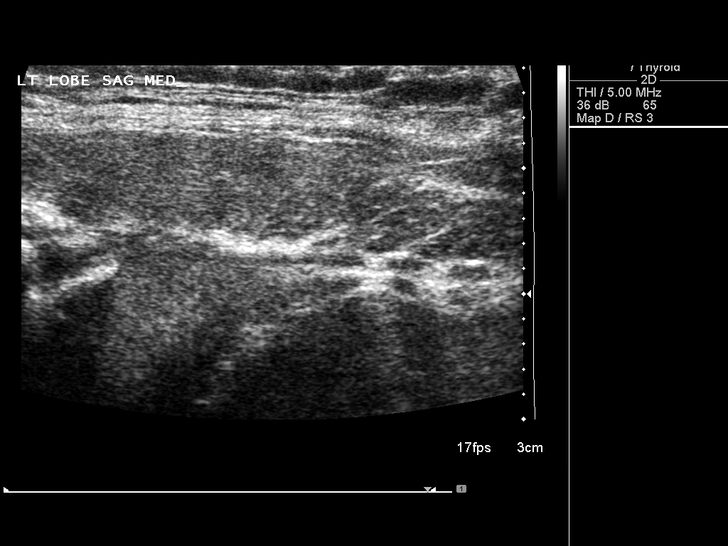
[im 27/50]
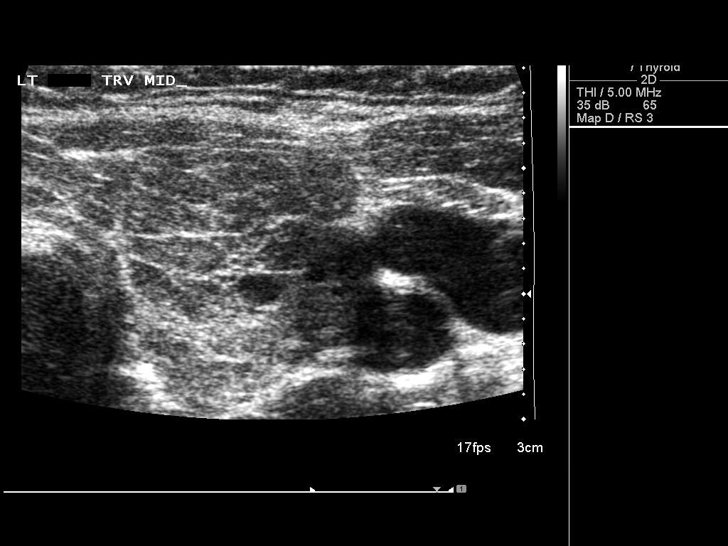
[im 31/50]
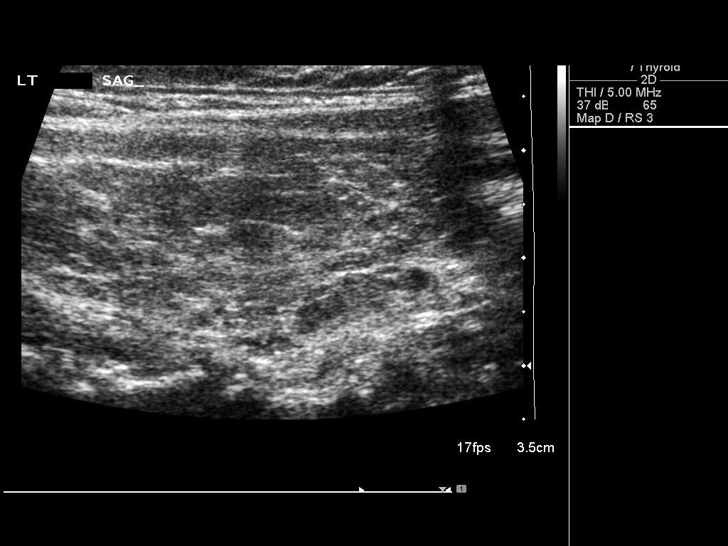
[im 33/50]
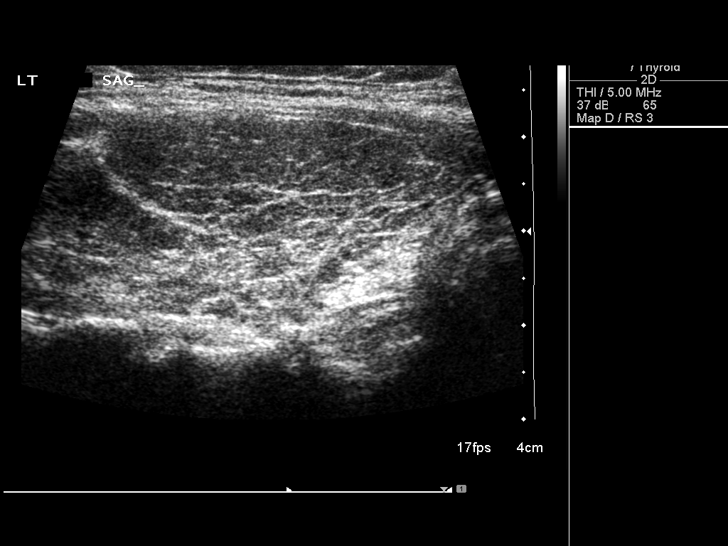
[im 37/50]
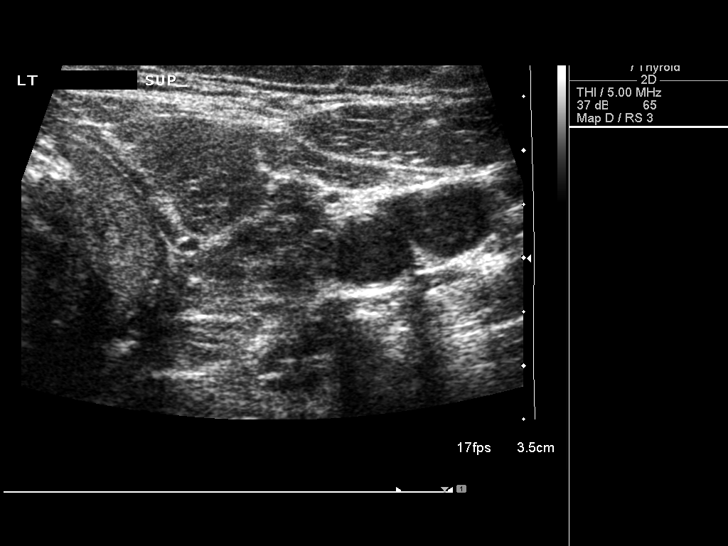
[im 41/50]
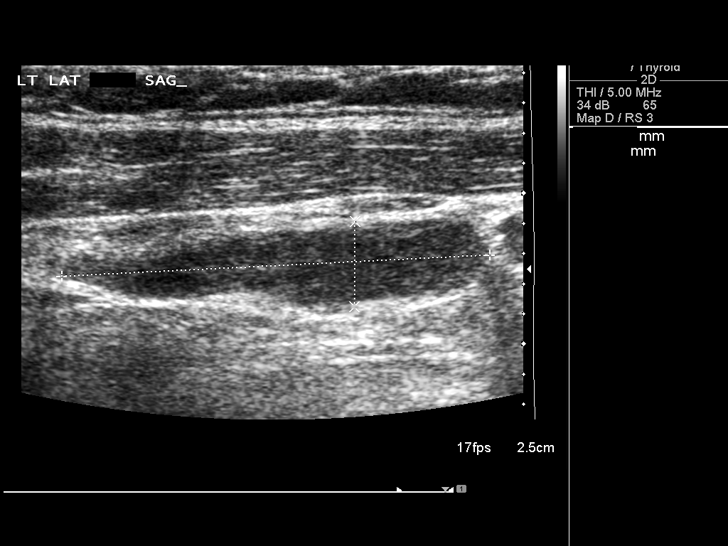
[im 45/50]
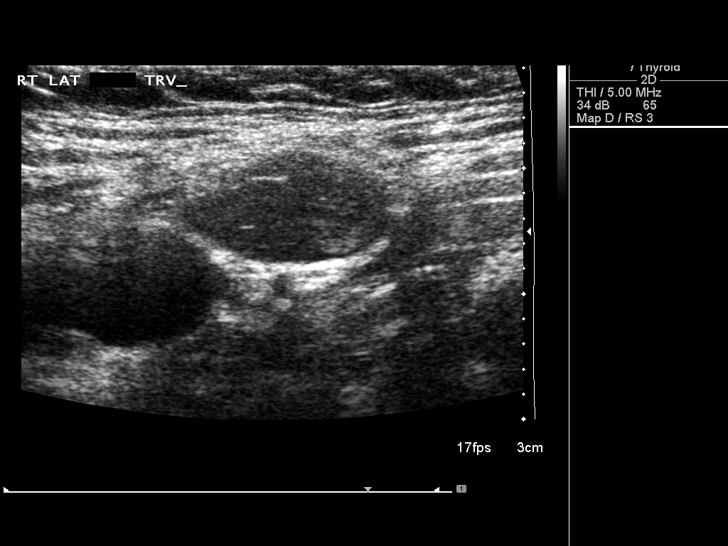
[im 50/50]
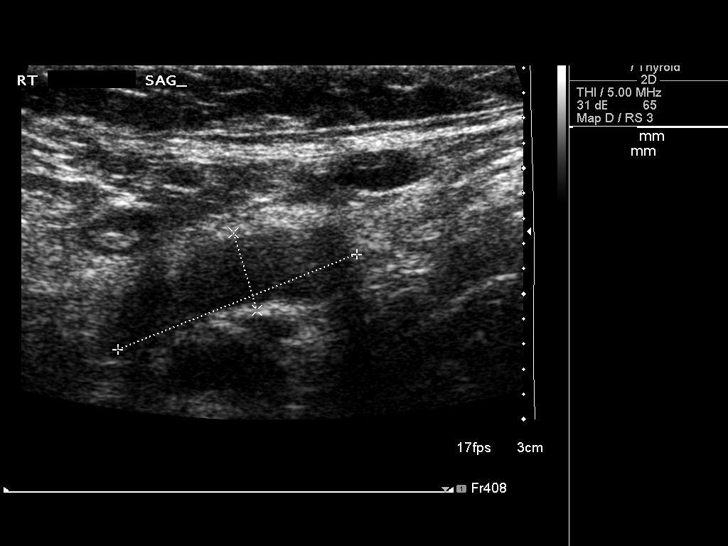

[14 of 25 positions shown; findings below may reference images not displayed]

FINDINGS: Right thyroid lobe:  5.3 x 2.3 x 2.2 cm.  (Previously 4.8 x 2.2 x
2.1 cm).
Left thyroid lobe:  5.7 x 2.6 x 2.1 cm.  (Previously 5.5 x 1.7 x
2.1 cm).
Isthmus:  7 mm, stable compared to the prior measurement.

Focal nodules:  The thyroid gland is diffusely inhomogeneous and
nodular.  No measurable nodes are noted.

Lymphadenopathy:   However there are slightly prominent lymph nodes
which were present previously.  The largest node on the left
measures 1.6 x 3.0 x 0.5 cm, with the largest node on the right
measuring 1.8 x 1.9 x 0.8 cm.
IMPRESSION: 1.  Stable inhomogeneous enlarged thyroid gland without measurable
nodules.
2.  Prominent lymph nodes are stable bilaterally.

## 2010-11-17 NOTE — Op Note (Signed)
NAMEJoselinne, [name]                ACCOUNT NO.:  [id number]   MEDICAL RECORD NO.:  [id number]          PATIENT TYPE:  AMB   LOCATION:  SDC                           FACILITY:  [facility]   PHYSICIAN:  Zenaida Niece, M.D.DATE OF BIRTH:  [birth year]   DATE OF PROCEDURE:  04/07/2007  DATE OF DISCHARGE:                               OPERATIVE REPORT   PREOPERATIVE DIAGNOSES:  Abnormal uterine bleeding.   POSTOPERATIVE DIAGNOSES:  Abnormal uterine bleeding.   PROCEDURE:  NovaSure endometrial ablation.   SURGEON:  Zenaida Niece, M.D.   ANESTHESIA:  IV sedation and paracervical block.   FINDINGS:  The uterus sounded to 8 cm and the cervix measured 3.5 cm.  The NovaSure device used a width of 3 cm and used 74 watts for 110  seconds.   SPECIMENS:  None.   ESTIMATED BLOOD LOSS:  Minimal.   COMPLICATIONS:  None.   PROCEDURE IN DETAIL:  Patient was taken to the operating room and placed  in the dorsal supine position.  She was given IV sedation and placed in  mobile stirrups.  Perineum and vagina were then prepped and draped in  the usual sterile fashion and bladder drained with a Latex-free  catheter.  A Graves speculum was inserted into the vagina and 2 mL of 2%  plain lidocaine were infiltrated at 12 o'clock.  The anterior lip of the  cervix was then grasped with a single-toothed tenaculum.  Deep  paracervical block was then performed with an additional 16 mL of 2%  plain lidocaine.  The uterus then sounded to 8 cm and was midplanar to  retroverted.  The cervix was gradually dilated to accommodate a size 7  Hegar dilator.  Using this dilator, the cervix measured 3.5 cm and the  cervix was gradually dilated to a size 8 Hegar dilator.  The NovaSure  device was then inserted and deployed appropriately.  The CO2 test was  passed and endometrial ablation was performed with the above mentioned  settings without difficulty.  The NovaSure device was then removed and  found to be intact.   The single-toothed tenaculum was removed and all  bleeding was controlled with pressure.  The speculum was then removed.  The patient tolerated the procedure well and was taken to the recovery  room in stable condition.  Counts were correct.      Zenaida Niece, M.D.  Electronically Signed    TDM/MEDQ  D:  04/07/2007  T:  04/07/2007  Job:  [id number]

## 2010-11-17 NOTE — H&P (Signed)
NAMEBetrice, [name]                ACCOUNT NO.:  [id number]   MEDICAL RECORD NO.:  [id number]           PATIENT TYPE:   LOCATION:                                FACILITY:  [facility]   PHYSICIAN:  Zenaida Niece, M.D.DATE OF BIRTH:  05/23/1971   DATE OF ADMISSION:  DATE OF DISCHARGE:                              HISTORY & PHYSICAL   CHIEF COMPLAINT:  Menorrhagia.   HISTORY OF PRESENT ILLNESS:  This is a 40 year old female, para 2-0-0-2  who was seen by our nurse practitioner for an annual exam on February 15, 2007.  She is having menses every month that are extremely heavy  associated with significant pelvic pressure.  She says that she changes  tampons every hour to 1-1/2 during the first two days.  Office  hemoglobin at that point was 12.9.  She had a pelvic ultrasound  performed on August 19, which was normal.  All options have been  discussed with her.  She wishes to proceed with endometrial ablation.  She has had an endometrial biopsy in the office which was benign with a  small focus of breakdown.   PAST OB HISTORY:  Significant for two vaginal deliveries without  complications.   PAST MEDICAL HISTORY:  Essentially negative except for hypothyroidism.   PAST SURGICAL HISTORY:  In 1991, she had an atrial-septal defect  repaired, and she has had a tonsillectomy and adenoidectomy.   ALLERGIES:  None known.   CURRENT MEDICATIONS:  Synthroid 137 mcg a day.   SOCIAL HISTORY:  She denies alcohol, tobacco, or drug use.   FAMILY HISTORY:  No GYN or colon cancer.   REVIEW OF SYSTEMS:  Normal bowel and bladder function, and occasional  vaginal discharge.   PHYSICAL EXAMINATION:  GENERAL:  This is a well-developed female in no  acute distress.  VITAL SIGNS:  Weight is 174 pounds.  Blood pressure 110/60.  NECK:  Supple without lymphadenopathy or thyromegaly.  LUNGS:  Clear to auscultation.  HEART:  Regular rate and rhythm without murmur.  ABDOMEN:  Soft, and nontender, nondistended  without palpable masses.  EXTREMITIES:  No edema and are nontender.  PELVIC EXAM:  External genitalia have no lesions.  On speculum exam the  cervix is normal and Pap smear was normal.  On bimanual exam, the uterus  is retroverted, normal size, and she has no rectal masses, and this is  confirmed by rectovaginal exam.   ASSESSMENT:  Menorrhagia with normal ultrasound.  All medical and  surgical options have been discussed with the patient, and she wishes to  proceed with endometrial ablation.  Risks of surgery have been  discussed.   PLAN:  To admit the patient on the day of surgery for NovaSure  endometrial ablation.      Zenaida Niece, M.D.  Electronically Signed     TDM/MEDQ  D:  04/06/2007  T:  04/06/2007  Job:  [id number]

## 2010-11-20 NOTE — Discharge Summary (Signed)
NAMEEmelda, [name]                            ACCOUNT NO.:  [id number]   MEDICAL RECORD NO.:  [id number]                   PATIENT TYPE:  INP   LOCATION:  9134                                 FACILITY:  [facility]   PHYSICIAN:  Malachi Pro. Ambrose Mantle, M.D.              DATE OF BIRTH:  [birth year]   DATE OF ADMISSION:  05/26/2003  DATE OF DISCHARGE:                                 DISCHARGE SUMMARY   HOSPITAL COURSE:  A 40 year old white married female para 1-0-0-1 gravida 2;  last period September 02, 2002; [name] June 11, 2003 by dates and June 09, 2003 by ultrasound; admitted for labor augmentation.  Blood group and type  AB positive with a negative antibody, nonreactive serology, rubella  equivocal, hepatitis B surface antigen negative, HIV negative, GC and  chlamydia negative, cystic fibrosis negative, triple screen declined, one-  hour Glucola 136, group B strep negative.  Vaginal ultrasound on Nov 15, 2002:  Crown-rump length 3.72 cm, 10 weeks four days, [facility] June 09, 2003.  Repeat ultrasound on January 09, 2003 gave a due date of June 06, 2003.  The  middle phalanx of the fifth finger was absent.  There was mild bilateral  pyelectasis.  Follow-up ultrasound February 06, 2003:  Small middle phalanx of  the right hand was seen.  The left hand was not well seen.  On May 01, 2003 ultrasound showed the right renal pelvis to be 6-7 mm, left renal  pelvis 2.9 mm; hands could not be studied.  On May 14, 2003 cervix was 1  cm, 40%.  On May 22, 2003 cervix 3 cm, 50%.  The patient came to the  maternity admissions unit for evaluation of decreased fetal movement.  Nonstress test was reactive but the nurse's exam suggested the cervix  changed from 4 cm and 50% to 4 cm and 70%.  After discussion with the  patient and her husband the patient was admitted for augmentation.  The  patient does not take antibiotics prior to surgical or dental procedures.  No known allergies.  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  Illnesses:  None except UTIs.  Family history:  Sister  with diabetes; maternal grandmother - high blood pressure, mini strokes,  diabetes; maternal grandfather - MI.  Obstetric history:  In November 1998  the patient had a 7-pound 2-ounce female, vaginal delivery, prolonged second  stage, low forceps.  On admission her vital signs were normal, heart normal  size and sounds, no murmurs, lungs clear to P&A, abdomen soft, fundal height  had been 35 cm on May 22, 2003.  Fetal heart tones were normal.  There  were good accelerations and no decelerations.  The cervix was 3-4 cm, 70%,  vertex at a -2.  Pitocin was started.  Contractions became every three to  five minutes.  The cervix  was 3-4 cm at 10:50 p.m.  Artificial rupture of  the membranes produced clear fluid.  By 4 a.m. the contractions were every  three minutes.  The cervix remained at 5 cm for a couple of hours but then  the cervix was 8 cm.  At 5 a.m. the cervix was 9+ cm, vertex at a 0 to +1  station.  She became fully dilated and pushed well.  She made slow progress  and delivered spontaneously OA over an intact perineum a living female  infant, Apgars 9 at one and 9 at five minutes, weight 7 pounds 14 ounces.  There was a right labial laceration.  There was moderate shoulder dystocia  managed by McRoberts, suprapubic pressure, and wood screw maneuver.  Total  time was approximately one minute.  The placenta was intact, uterus normal,  blood loss about 500 mL.  The baby remained pink but retracted.  It was  taken to the nursery and on arrival in the nursery appeared completely  normal.  Dr. Ambrose Mantle was in attendance.  Postpartum the patient did well and  was discharged on postpartum day #2.   LABORATORY DATA:  Hemoglobin 13.3; hematocrit 37.2; white count was 10,400;  follow-up hemoglobin 12.0.  RPR nonreactive.  A rubella titer was advised on  admission but the request  was misunderstood and so the patient had a rubeola  titer done that was 0.72.  The rubella titer is being done now and depending  on the result the patient may require rubella vaccine prior to discharge.   DISCHARGE DIAGNOSIS:  Intrauterine pregnancy at 38 weeks.   OPERATION:  Spontaneous delivery OA, moderate shoulder dystocia.   FINAL CONDITION:  Improved.   Instructions include our regular discharge instruction booklet.  The patient  declines analgesics at discharge.  She is advised to return to the office in  six weeks for follow-up examination.                                               Malachi Pro. Ambrose Mantle, M.D.    TFH/MEDQ  D:  05/29/2003  T:  05/29/2003  Job:  [id number]

## 2011-04-15 LAB — CBC
HCT: 41.5
Hemoglobin: 14.6
MCHC: 35.1
MCV: 89.6
Platelets: 269
RBC: 4.63
RDW: 12.4
WBC: 6.6

## 2011-04-15 LAB — PREGNANCY, URINE: Preg Test, Ur: NEGATIVE

## 2011-12-19 ENCOUNTER — Telehealth: Payer: Self-pay | Admitting: Family Medicine

## 2011-12-19 DIAGNOSIS — Z0000 Encounter for general adult medical examination without abnormal findings: Secondary | ICD-10-CM

## 2011-12-19 NOTE — Telephone Encounter (Signed)
Message copied by Judy Pimple on Sun Dec 19, 2011  6:17 PM ------      Message from: Baldomero Lamy      Created: Tue Dec 14, 2011  8:24 AM      Regarding: Cpx labs Monday 6/17       Please order  future cpx labs for pt's upcomming lab appt.      Thanks      Rodney Booze

## 2011-12-19 NOTE — Telephone Encounter (Signed)
Message copied by Judy Pimple on Sun Dec 19, 2011  7:09 PM ------      Message from: Baldomero Lamy      Created: Tue Dec 14, 2011  8:24 AM      Regarding: Cpx labs Monday 6/17       Please order  future cpx labs for pt's upcomming lab appt.      Thanks      Rodney Booze

## 2011-12-20 ENCOUNTER — Other Ambulatory Visit (INDEPENDENT_AMBULATORY_CARE_PROVIDER_SITE_OTHER): Payer: BC Managed Care – PPO

## 2011-12-20 DIAGNOSIS — Z0000 Encounter for general adult medical examination without abnormal findings: Secondary | ICD-10-CM

## 2011-12-20 LAB — CBC WITH DIFFERENTIAL/PLATELET
Basophils Absolute: 0 10*3/uL (ref 0.0–0.1)
Basophils Relative: 0.7 % (ref 0.0–3.0)
Eosinophils Absolute: 0.3 10*3/uL (ref 0.0–0.7)
Eosinophils Relative: 5.9 % — ABNORMAL HIGH (ref 0.0–5.0)
HCT: 40 % (ref 36.0–46.0)
Hemoglobin: 13.6 g/dL (ref 12.0–15.0)
Lymphocytes Relative: 34.3 % (ref 12.0–46.0)
Lymphs Abs: 2 10*3/uL (ref 0.7–4.0)
MCHC: 34.1 g/dL (ref 30.0–36.0)
MCV: 92.7 fl (ref 78.0–100.0)
Monocytes Absolute: 0.4 10*3/uL (ref 0.1–1.0)
Monocytes Relative: 7.4 % (ref 3.0–12.0)
Neutro Abs: 3 10*3/uL (ref 1.4–7.7)
Neutrophils Relative %: 51.7 % (ref 43.0–77.0)
Platelets: 225 10*3/uL (ref 150.0–400.0)
RBC: 4.32 Mil/uL (ref 3.87–5.11)
RDW: 13.3 % (ref 11.5–14.6)
WBC: 5.9 10*3/uL (ref 4.5–10.5)

## 2011-12-20 LAB — COMPREHENSIVE METABOLIC PANEL
ALT: 14 U/L (ref 0–35)
AST: 15 U/L (ref 0–37)
Albumin: 4 g/dL (ref 3.5–5.2)
Alkaline Phosphatase: 68 U/L (ref 39–117)
BUN: 14 mg/dL (ref 6–23)
CO2: 23 mEq/L (ref 19–32)
Calcium: 8.5 mg/dL (ref 8.4–10.5)
Chloride: 109 mEq/L (ref 96–112)
Creatinine, Ser: 0.5 mg/dL (ref 0.4–1.2)
GFR: 132.86 mL/min (ref >60.00)
Glucose, Bld: 107 mg/dL — ABNORMAL HIGH (ref 70–99)
Potassium: 4 mEq/L (ref 3.5–5.1)
Sodium: 139 mEq/L (ref 135–145)
Total Bilirubin: 0.3 mg/dL (ref 0.3–1.2)
Total Protein: 7 g/dL (ref 6.0–8.3)

## 2011-12-20 LAB — TSH: TSH: 2.01 u[IU]/mL (ref 0.35–5.50)

## 2011-12-22 ENCOUNTER — Other Ambulatory Visit (HOSPITAL_COMMUNITY)
Admission: RE | Admit: 2011-12-22 | Discharge: 2011-12-22 | Disposition: A | Discharge status: Home / Self Care | Payer: Commercial Managed Care - PPO | Source: Ambulatory Visit | Attending: Family Medicine | Admitting: Family Medicine

## 2011-12-22 ENCOUNTER — Ambulatory Visit (INDEPENDENT_AMBULATORY_CARE_PROVIDER_SITE_OTHER): Payer: Commercial Managed Care - PPO | Admitting: Family Medicine

## 2011-12-22 ENCOUNTER — Encounter: Payer: Self-pay | Admitting: *Deleted

## 2011-12-22 VITALS — BP 98/66 | HR 64 | Temp 98.1°F | Ht 63.0 in | Wt 167.5 lb

## 2011-12-22 DIAGNOSIS — Z0000 Encounter for general adult medical examination without abnormal findings: Secondary | ICD-10-CM

## 2011-12-22 DIAGNOSIS — Z01419 Encounter for gynecological examination (general) (routine) without abnormal findings: Secondary | ICD-10-CM | POA: Insufficient documentation

## 2011-12-22 DIAGNOSIS — Z841 Family history of disorders of kidney and ureter: Secondary | ICD-10-CM | POA: Insufficient documentation

## 2011-12-22 DIAGNOSIS — N393 Stress incontinence (female) (male): Secondary | ICD-10-CM

## 2011-12-22 DIAGNOSIS — Z1159 Encounter for screening for other viral diseases: Secondary | ICD-10-CM | POA: Insufficient documentation

## 2011-12-22 DIAGNOSIS — E039 Hypothyroidism, unspecified: Secondary | ICD-10-CM

## 2011-12-22 LAB — POCT URINALYSIS DIPSTICK
Bilirubin, UA: NEGATIVE
Glucose, UA: NEGATIVE
Ketones, UA: NEGATIVE
Leukocytes, UA: NEGATIVE
Nitrite, UA: NEGATIVE
Protein, UA: NEGATIVE
Spec Grav, UA: <=1.005
Urobilinogen, UA: 0.2
pH, UA: 6

## 2011-12-22 NOTE — Progress Notes (Signed)
Subjective:    Patient ID: [name], female    DOB: 1971-03-24, 41 y.o.   MRN: [id number]  HPI Here for health maintenance exam and to review chronic medical problems   Is doing ok  A lot going on / very busy and some stress   Father dx with fsgs kidney disease (focal segmental glomerular sclerosis)  Can be hereditary - talked to his kidney test  She needs ua for protein  She has never had urinary symptoms or problems  bp is 98/66  Wt is down 10 lb with bmi of 29- has been working really hard on that with weight watchers    Glucose is 107 Is doing well watching sugar and sweets 110 in the past No symptoms   Lipids good at [facility]  No results found for this basename: CHOL, HDL, LDLCALC, LDLDIRECT, TRIG, CHOLHDL   from April tot chol 829 and LDL 108 and HDL 61  And trig 104  Hypothyroid Lab Results  Component Value Date   TSH 2.01 12/20/2011  very good - no clinical changes  Has seen Dr Talmage Nap- is watching   Pap/gyn exam  Has been a while -needs pap and exam No abn paps   Td - just had one within past year   Mammogram- had one in April of last year after finding a lump- was normal baseline  She will schedule her own  Flu shot - in the fall   Patient Active Problem List  Diagnosis  . BREAST PAIN, RIGHT  . SHOULDER PAIN, RIGHT  . Routine general medical examination at a health care facility  . Hypothyroid  . Stress incontinence  . Family history of kidney disease  . Routine gynecological examination   Past Medical History  Diagnosis Date  . No diagnosis    Past Surgical History  Procedure Date  . Endometrial ablation     Uterine/heavy menses  . Shoulder surgery     Left,/bone spurs   History  Substance Use Topics  . Smoking status: Former Games developer  . Smokeless tobacco: Not on file  . Alcohol Use: Not on file   No family history on file. No Known Allergies Current Outpatient Prescriptions on File Prior to Visit  Medication Sig Dispense  Refill  . levothyroxine (SYNTHROID, LEVOTHROID) 137 MCG tablet Take 137 mcg by mouth daily.          Review of Systems Review of Systems  Constitutional: Negative for fever, appetite change, fatigue and unexpected weight change.  Eyes: Negative for pain and visual disturbance.  Respiratory: Negative for cough and shortness of breath.   Cardiovascular: Negative for cp or palpitations    Gastrointestinal: Negative for nausea, diarrhea and constipation.  Genitourinary: Negative for urgency and frequency.  Skin: Negative for pallor or rash   Neurological: Negative for weakness, light-headedness, numbness and headaches.  Hematological: Negative for adenopathy. Does not bruise/bleed easily.  Psychiatric/Behavioral: Negative for dysphoric mood. The patient is not nervous/anxious.         Objective:   Physical Exam  Constitutional: She appears well-developed and well-nourished. No distress.       overwt and well appearing   HENT:  Head: Normocephalic and atraumatic.  Right Ear: External ear normal.  Left Ear: External ear normal.  Nose: Nose normal.  Mouth/Throat: Oropharynx is clear and moist.  Eyes: Conjunctivae and EOM are normal. Pupils are equal, round, and reactive to light. No scleral icterus.  Neck: Normal range of motion. Neck supple. No JVD  present. No thyromegaly present.  Cardiovascular: Normal rate, regular rhythm, normal heart sounds and intact distal pulses.  Exam reveals no gallop.   Pulmonary/Chest: Effort normal and breath sounds normal. No respiratory distress. She has no wheezes.  Abdominal: Soft. Bowel sounds are normal. She exhibits no distension and no mass. There is no tenderness.  Genitourinary: Vagina normal and uterus normal. No breast swelling, tenderness, discharge or bleeding. Cervix exhibits no motion tenderness, no discharge and no friability. Right adnexum displays no mass, no tenderness and no fullness. Left adnexum displays no mass, no tenderness and no  fullness. No vaginal discharge found.       Breast exam: No mass, nodules, thickening, tenderness, bulging, retraction, inflamation, nipple discharge or skin changes noted.  No axillary or clavicular LA.  Chaperoned exam.   Pap caused scant cervical spotting but cervix is not friable   Musculoskeletal: Normal range of motion. She exhibits no edema and no tenderness.  Lymphadenopathy:    She has no cervical adenopathy.  Neurological: She is alert. She has normal reflexes. No cranial nerve deficit. She exhibits normal muscle tone. Coordination normal.  Skin: Skin is warm and dry. No rash noted. No erythema. No pallor.  Psychiatric: She has a normal mood and affect.          Assessment & Plan:

## 2011-12-22 NOTE — Patient Instructions (Addendum)
Don't forget to get your mammogram scheduled We will do urology referral at check out We will do urinalysis on the way out and update you if any abnormalities Keep working on healthy diet and exercise

## 2011-12-23 NOTE — Assessment & Plan Note (Signed)
Reviewed health habits including diet and exercise and skin cancer prevention Also reviewed health mt list, fam hx and immunizations   Wellness labs reviewed in detail  

## 2011-12-23 NOTE — Assessment & Plan Note (Signed)
tsh is theraputic No clinical changes  No change in dose  

## 2011-12-23 NOTE — Assessment & Plan Note (Signed)
Exam with pap -no problems or complaints

## 2011-12-23 NOTE — Assessment & Plan Note (Signed)
Father has focal segmental glomerular sclerosis - per his nephrologist this can be genetic ua is neg for protein today- will continue to watch Pos for blood- but pt had vaginal spotting at time of test

## 2011-12-23 NOTE — Assessment & Plan Note (Signed)
Since birth of last child- not improved with kegel exercises  Will ref to [facility] for eval

## 2011-12-28 ENCOUNTER — Other Ambulatory Visit: Payer: Self-pay | Admitting: Endocrinology

## 2011-12-28 DIAGNOSIS — E049 Nontoxic goiter, unspecified: Secondary | ICD-10-CM

## 2011-12-30 ENCOUNTER — Encounter: Payer: Self-pay | Admitting: *Deleted

## 2012-03-13 ENCOUNTER — Ambulatory Visit
Admission: RE | Admit: 2012-03-13 | Discharge: 2012-03-13 | Disposition: A | Discharge status: Home / Self Care | Payer: Commercial Managed Care - PPO | Source: Ambulatory Visit | Attending: Endocrinology | Admitting: Endocrinology

## 2012-03-13 ENCOUNTER — Other Ambulatory Visit: Payer: Commercial Managed Care - PPO

## 2012-03-13 DIAGNOSIS — E049 Nontoxic goiter, unspecified: Secondary | ICD-10-CM

## 2012-03-13 IMAGING — US US SOFT TISSUE HEAD/NECK
1 series · 14 of 25 positions shown · non-contrast
Comparison: [DATE]

CLINICAL DATA: Follow up of goiter.

THYROID ULTRASOUND
TECHNIQUE: Ultrasound examination of the thyroid gland and adjacent
soft tissues was performed.

[Series 1: us soft tissue head/neck · 0.10mm/px · 14 of 45 slices shown]
[im 1/45]
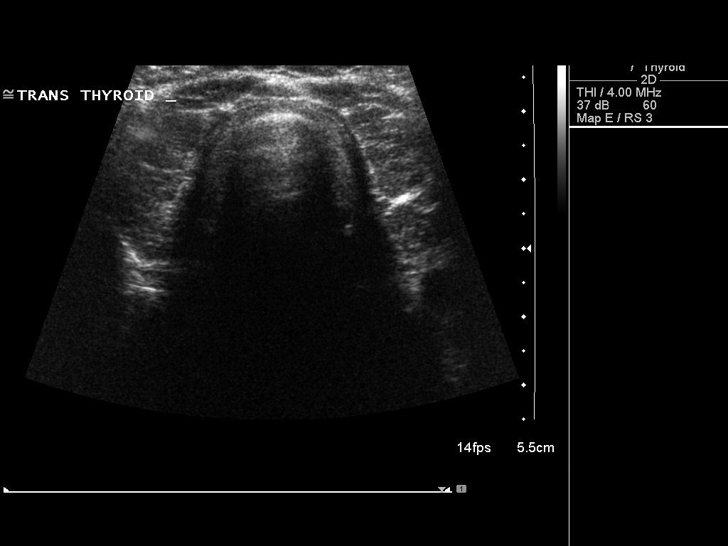
[im 4/45]
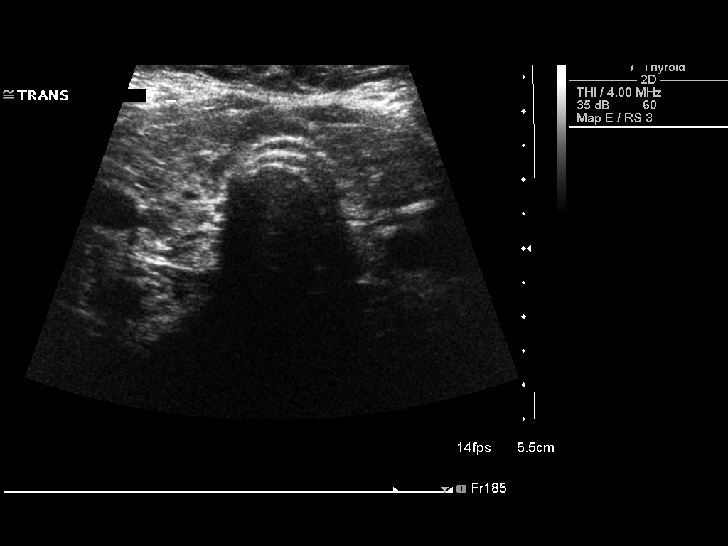
[im 8/45]
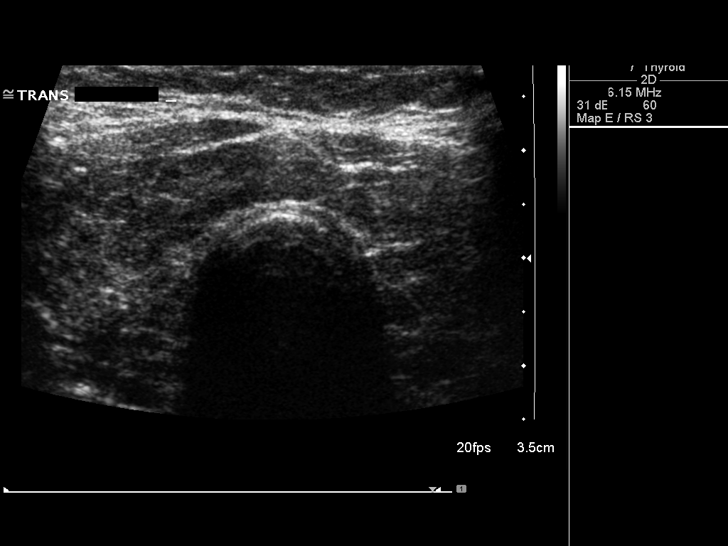
[im 12/45]
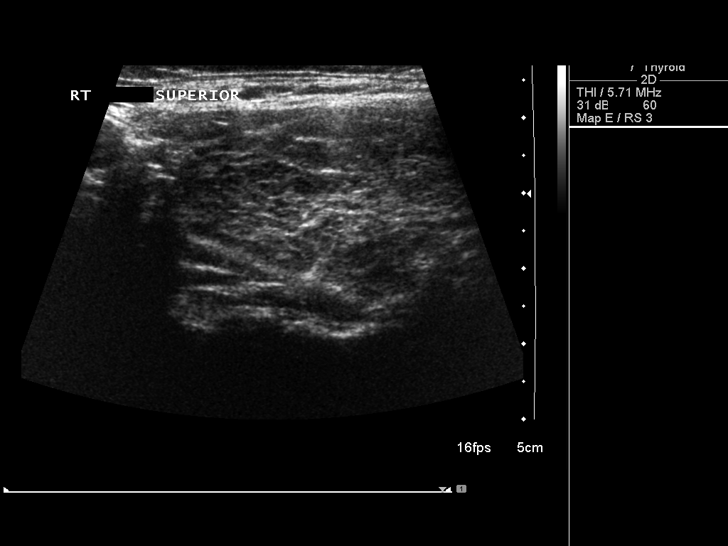
[im 15/45]
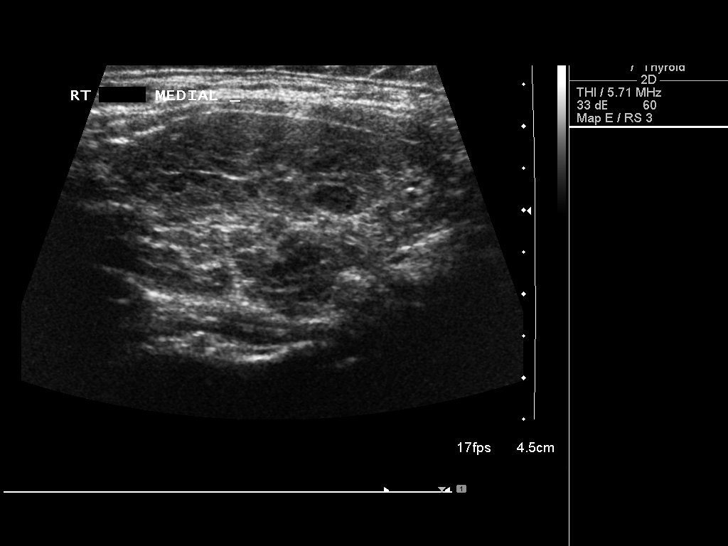
[im 17/45]
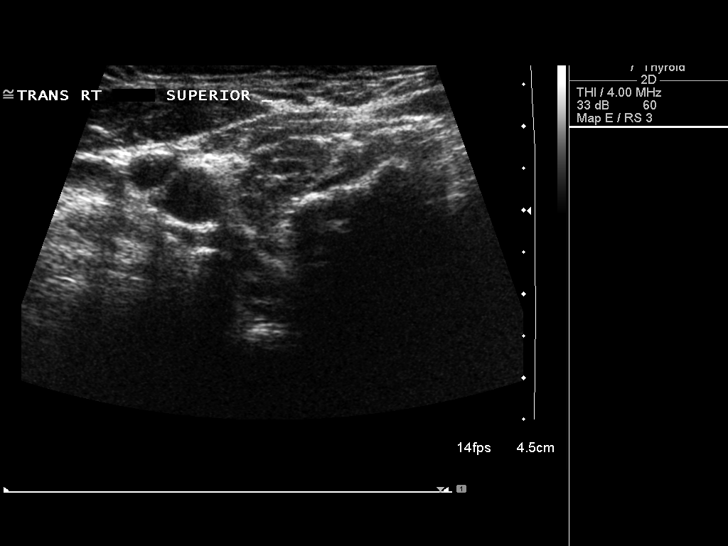
[im 21/45]
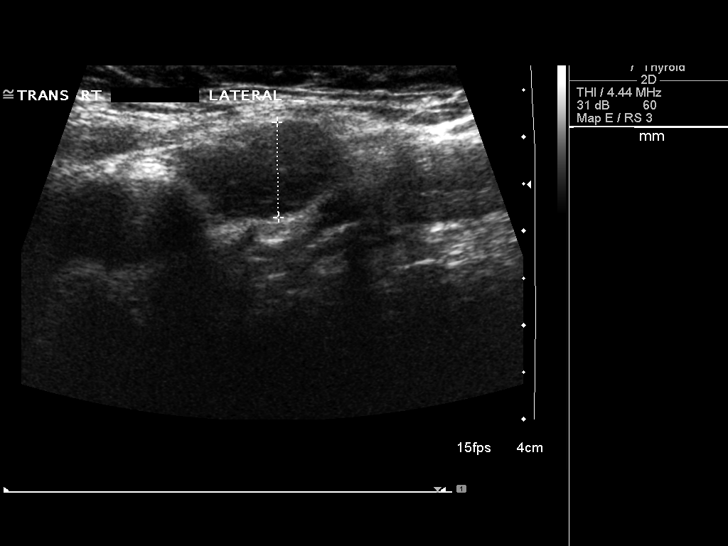
[im 24/45]
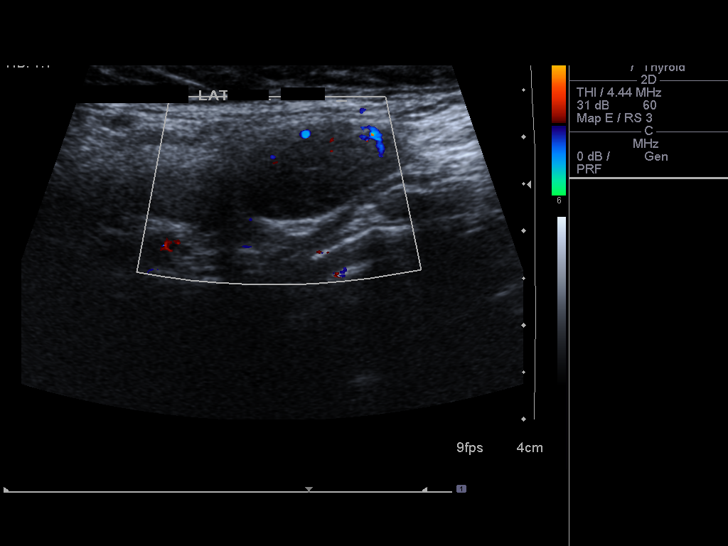
[im 28/45]
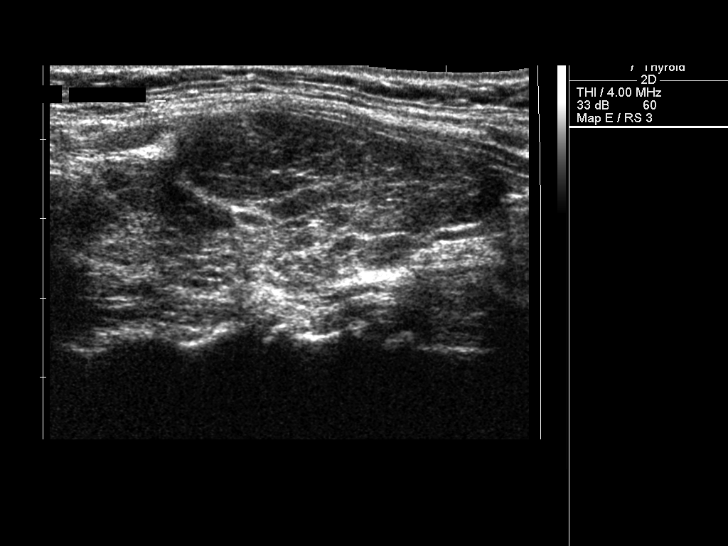
[im 30/45]
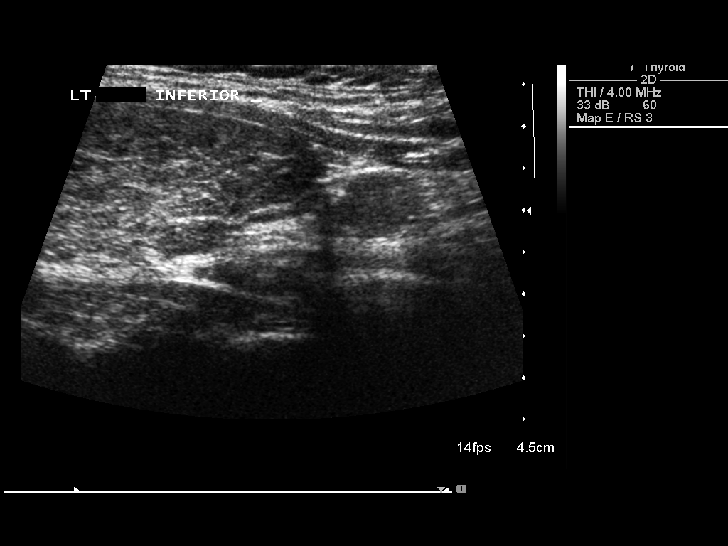
[im 34/45]
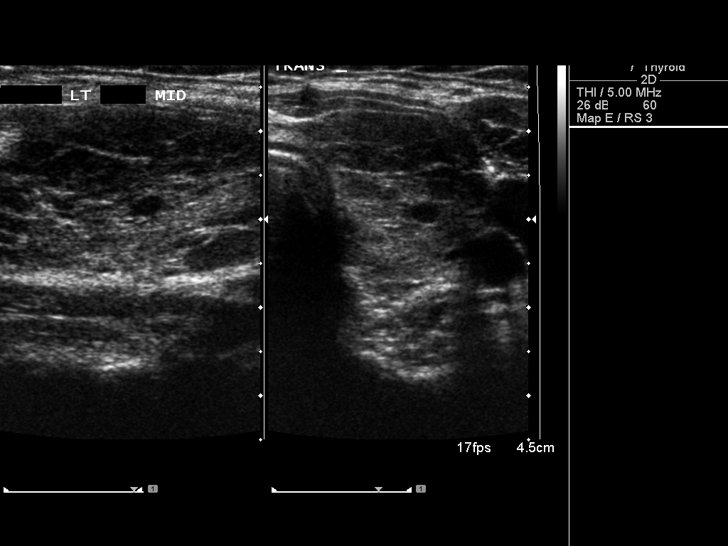
[im 37/45]
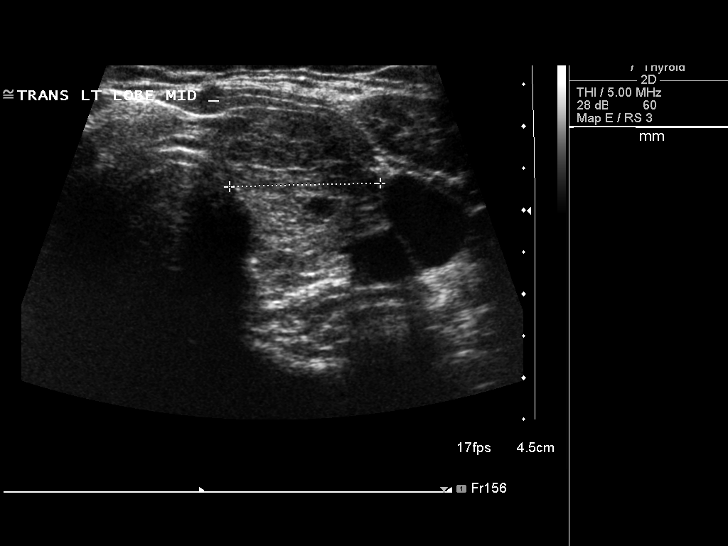
[im 41/45]
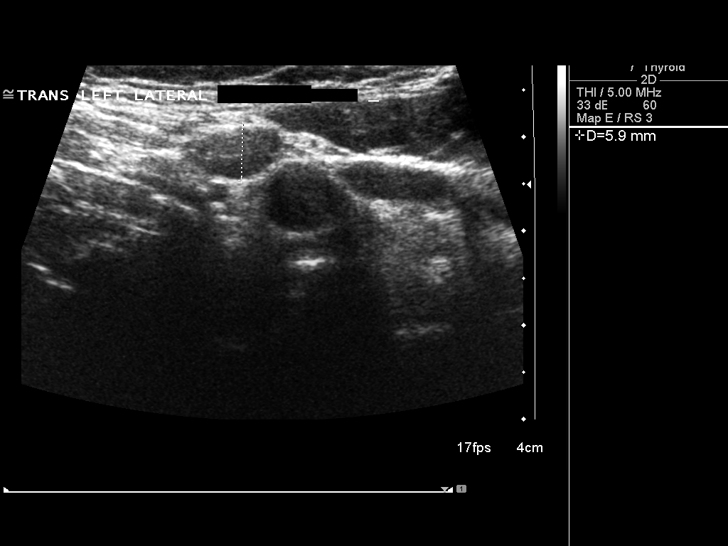
[im 45/45]
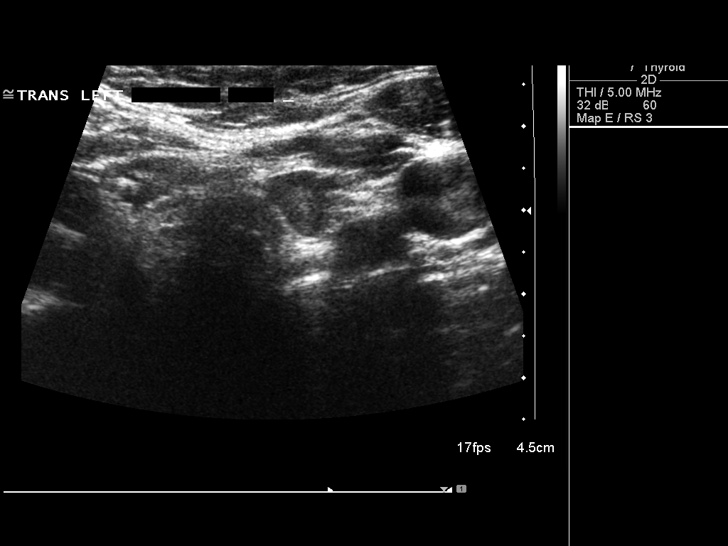

[14 of 25 positions shown; findings below may reference images not displayed]

FINDINGS: Right thyroid lobe:  5.2 x 2.1 x 2.2 cm (similar to the prior).
Left thyroid lobe:  5.7 x 2.4 x 1.8 cm (similar to the prior).
Isthmus:  7 mm; unchanged

Focal nodules:  Thyroid echotexture remains diffusely
heterogeneous.  Only well circumscribed nodule is a 4 mm hypoechoic
lesion which is similar to on the prior.

Lymphadenopathy:  Prominent but not pathologically enlarged nodes
are again identified.  These are similar in size to on the prior
exam.
IMPRESSION: 1.  Similar heterogeneous thyroid echotexture, most consistent with
multinodular goiter.
2.  No dominant solid or cystic lesion identified.
3.  Prominent cervical nodes are similar and likely reactive.

## 2012-12-27 ENCOUNTER — Other Ambulatory Visit: Payer: Self-pay | Admitting: Endocrinology

## 2012-12-27 DIAGNOSIS — E049 Nontoxic goiter, unspecified: Secondary | ICD-10-CM

## 2013-01-08 ENCOUNTER — Other Ambulatory Visit: Payer: Commercial Managed Care - PPO

## 2013-01-09 ENCOUNTER — Ambulatory Visit
Admission: RE | Admit: 2013-01-09 | Discharge: 2013-01-09 | Disposition: A | Discharge status: Home / Self Care | Payer: Commercial Managed Care - PPO | Source: Ambulatory Visit | Attending: Endocrinology | Admitting: Endocrinology

## 2013-01-09 DIAGNOSIS — E049 Nontoxic goiter, unspecified: Secondary | ICD-10-CM

## 2013-01-09 IMAGING — US US SOFT TISSUE HEAD/NECK
1 series · 14 of 25 positions shown · non-contrast
Comparison: [DATE]

CLINICAL DATA: Goiter

THYROID ULTRASOUND
TECHNIQUE: Ultrasound examination of the thyroid gland and adjacent
soft tissues was performed.

[Series 1: us soft tissue head/neck · 0.08mm/px · 14 of 50 slices shown]
[im 1/50]
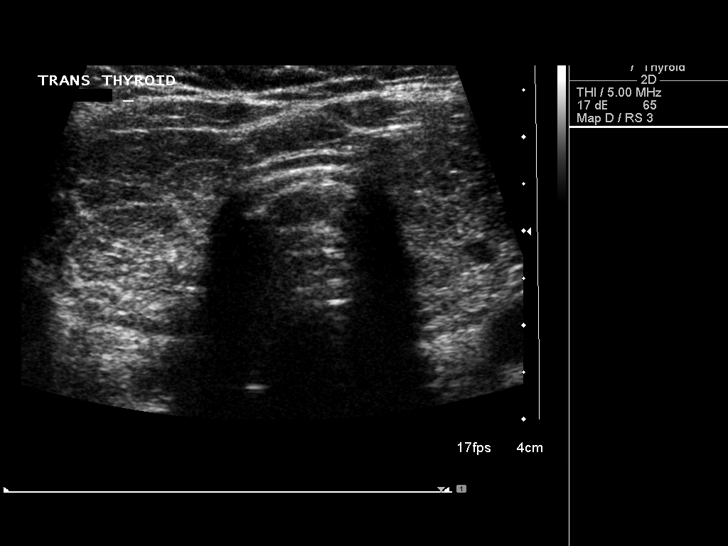
[im 5/50]
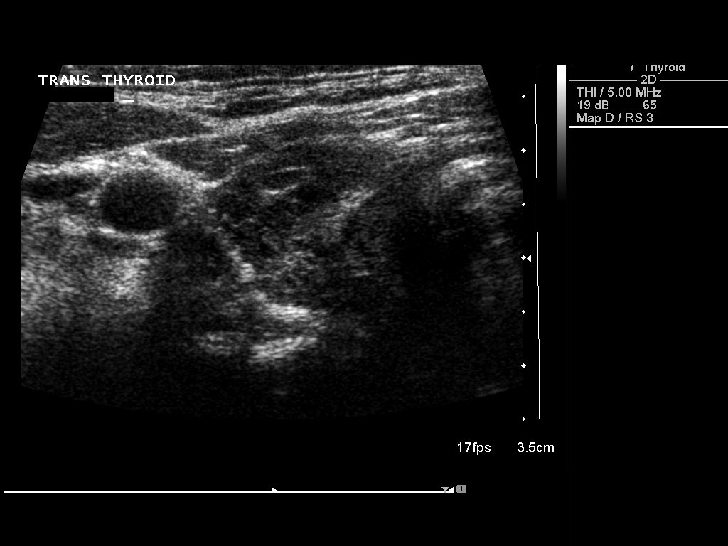
[im 9/50]
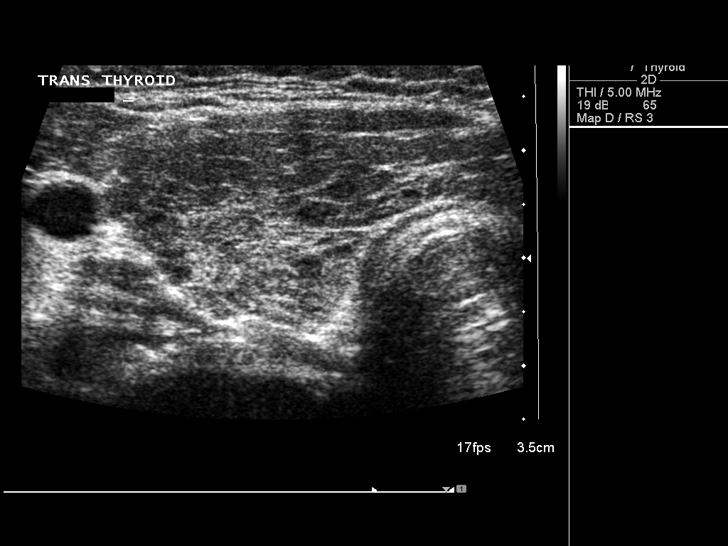
[im 13/50]
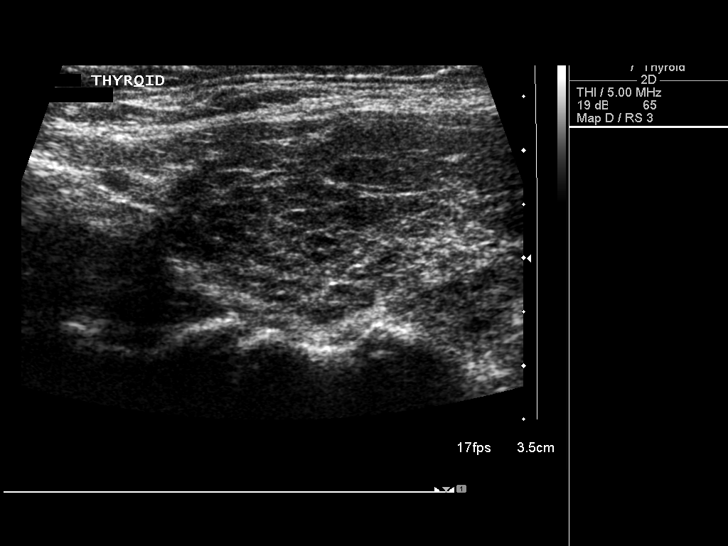
[im 17/50]
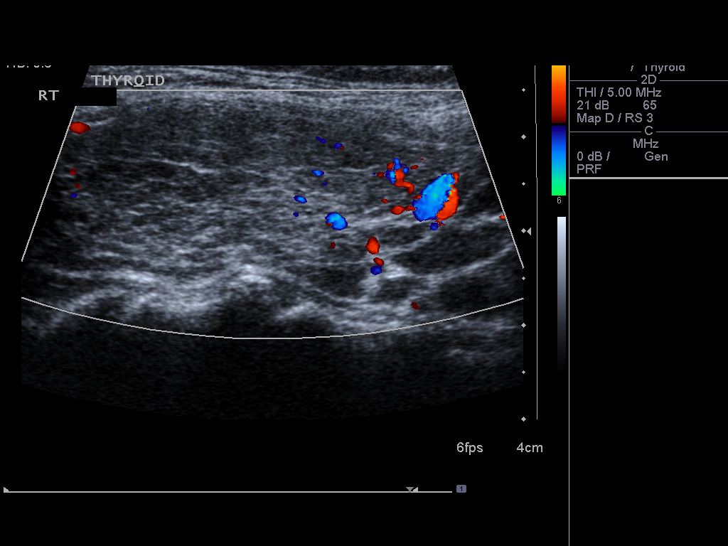
[im 19/50]
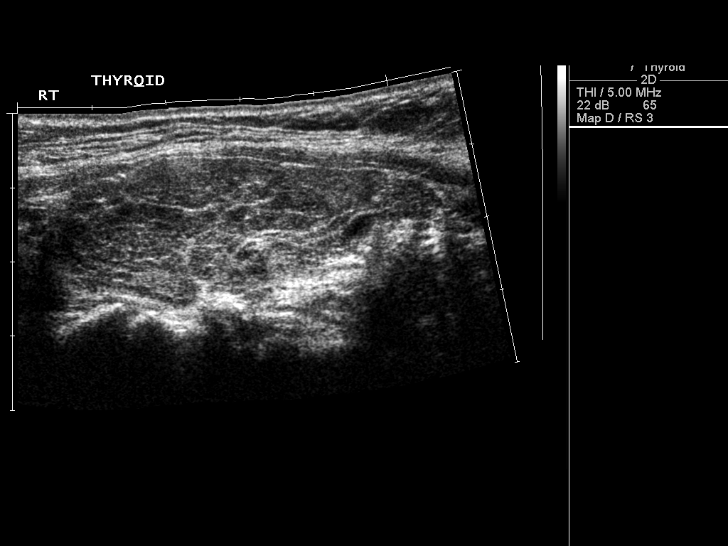
[im 23/50]
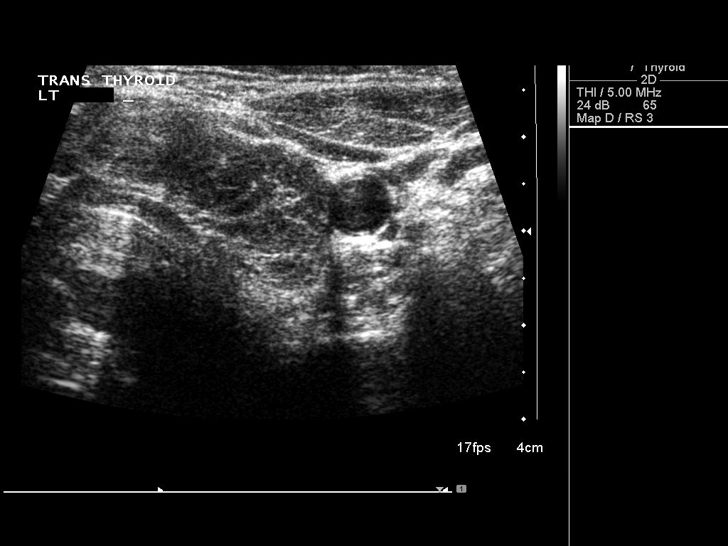
[im 27/50]
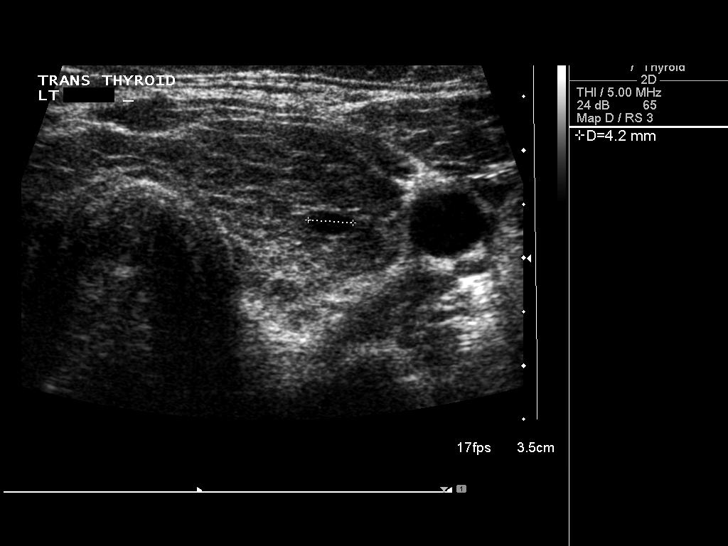
[im 31/50]
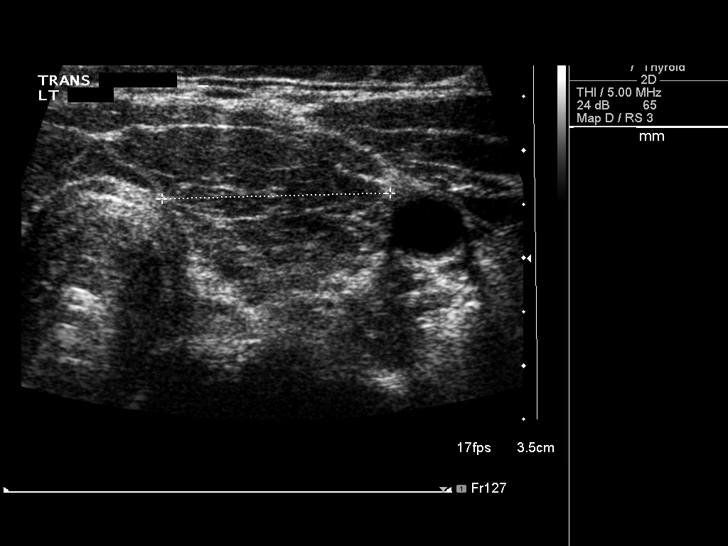
[im 33/50]
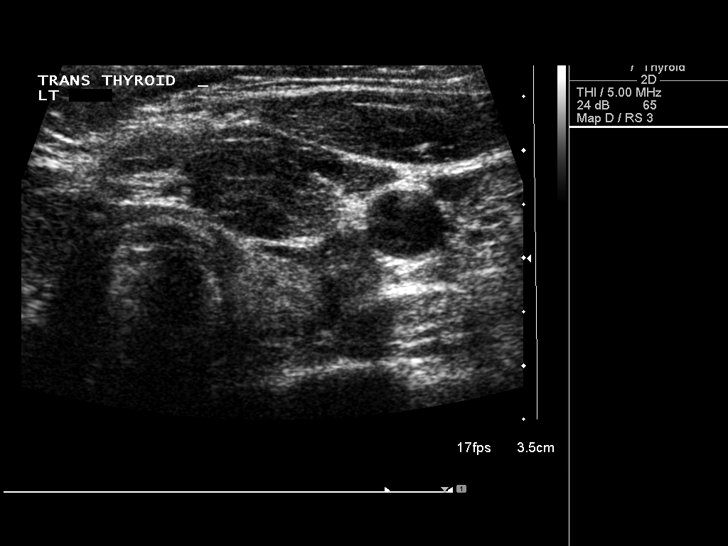
[im 37/50]
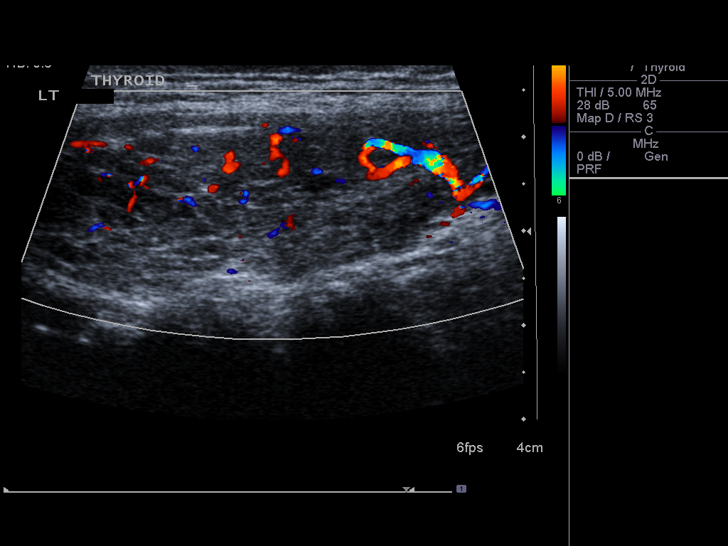
[im 41/50]
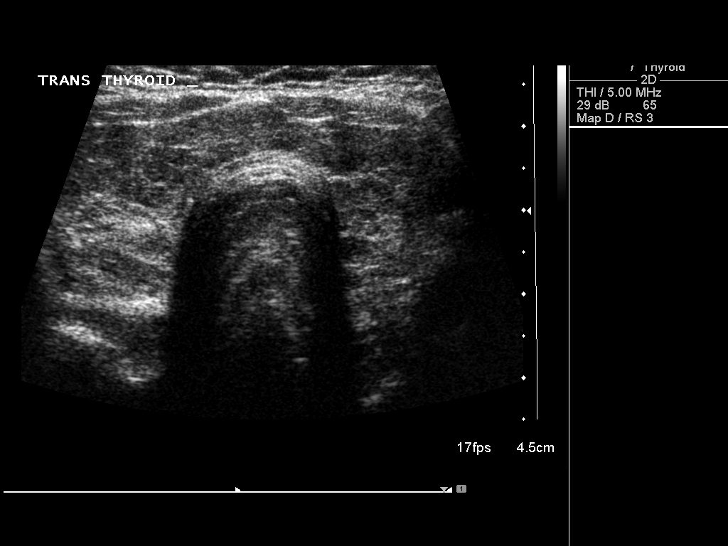
[im 45/50]
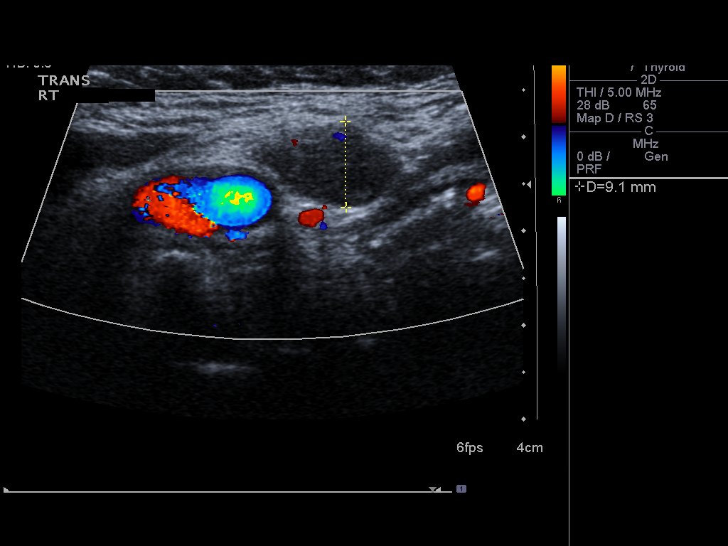
[im 50/50]
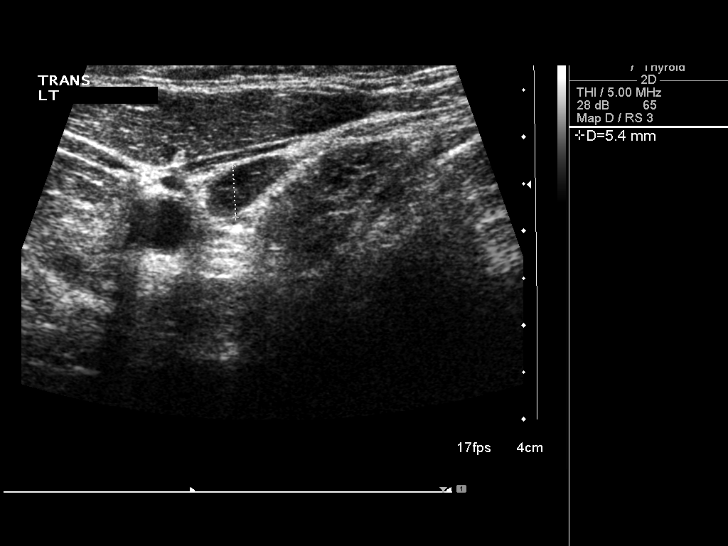

[14 of 25 positions shown; findings below may reference images not displayed]

FINDINGS: Right thyroid lobe:  Right lobe is enlarged and heterogeneous.  It
measures 5.4 cm x 2.1 cm x 2.7 cm.
Left thyroid lobe:  Left lobe is enlarged and heterogeneous.  It
measures 5.8 cm x 1.8 cm x 2.1 cm.
Isthmus:  The isthmus is thickened and heterogeneous measuring
mm.

Focal nodules:  Single small hypoechoic lesion is noted centrally
in the left lobe, stable from the prior exam.  No other discrete
nodules.

Lymphadenopathy:  There are bilateral prominent neck lymph nodes,
the largest on the right measuring 9 mm in short axis.  These are
stable.
IMPRESSION: Enlarged and heterogeneous thyroid gland most consistent with a
multinodular goiter.

Small stable 4 mm hypoechoic lesion in the left lobe.

Stable sub centimeter neck lymph nodes presumed to be reactive.

Overall, no change from the prior exam.

## 2013-04-06 ENCOUNTER — Ambulatory Visit (INDEPENDENT_AMBULATORY_CARE_PROVIDER_SITE_OTHER): Payer: Commercial Managed Care - PPO | Admitting: Internal Medicine

## 2013-04-06 ENCOUNTER — Encounter: Payer: Self-pay | Admitting: Internal Medicine

## 2013-04-06 VITALS — BP 110/68 | HR 72 | Temp 98.0°F | Wt 175.5 lb

## 2013-04-06 DIAGNOSIS — M5430 Sciatica, unspecified side: Secondary | ICD-10-CM

## 2013-04-06 DIAGNOSIS — M5431 Sciatica, right side: Secondary | ICD-10-CM

## 2013-04-06 MED ORDER — PREDNISONE 10 MG PO TABS: ORAL_TABLET | ORAL | Status: DC

## 2013-04-06 MED ORDER — CYCLOBENZAPRINE HCL 10 MG PO TABS: 10.0000 mg | ORAL_TABLET | Freq: Three times a day (TID) | ORAL | Status: DC | PRN

## 2013-04-06 NOTE — Progress Notes (Signed)
Subjective:    Patient ID: [name], female    DOB: March 30, 1971, 42 y.o.   MRN: [id number]  HPI  Pt presents to the clinic today with c/o back pain. This started 6 days ago. She was on the floor showing her daughter had to do a situp. She rolled over to stand up, got up normally. The next morning when she woke up she was experiencing low back pain. It has progressively gotten worse since that time. She has been putting ice, heating pad, and Aleve. She describes the pain as constant and sharp, 10/10. She does report some shooting pain that radiates into her right buttock. She does report that it does feel tingling. The pain is worse with movement, bending or twisting. She denies loss of bowel or bladder.  Review of Systems  Past Medical History  Diagnosis Date  . No diagnosis     Current Outpatient Prescriptions  Medication Sig Dispense Refill  . levothyroxine (SYNTHROID, LEVOTHROID) 137 MCG tablet Take 137 mcg by mouth daily.      . naproxen sodium (ANAPROX) 220 MG tablet Take 220 mg by mouth as needed.       No current facility-administered medications for this visit.    No Known Allergies  History reviewed. No pertinent family history.  History   Social History  . Marital Status: Married    Spouse Name: N/A    Number of Children: N/A  . Years of Education: N/A   Occupational History  . Not on file.   Social History Main Topics  . Smoking status: Former Games developer  . Smokeless tobacco: Not on file  . Alcohol Use: Not on file  . Drug Use: Not on file  . Sexual Activity: Not on file   Other Topics Concern  . Not on file   Social History Narrative   Last Mamm: 04.07.10 @ [facility] Category 1: Negative^MM DIGITAL DG BILATERAL     Constitutional: Denies fever, malaise, fatigue, headache or abrupt weight changes.  Musculoskeletal: Pt reports back pain. Denies decrease in range of motion, difficulty with gait, or joint pain and swelling.   Neurological: Pt reports  shooting pain into right buttock. Denies dizziness, difficulty with memory, difficulty with speech or problems with balance and coordination.   No other specific complaints in a complete review of systems (except as listed in HPI above).     Objective:   Physical Exam   BP 110/68  Pulse 72  Temp(Src) 98 F (36.7 C) (Oral)  Wt 175 lb 8 oz (79.606 kg)  BMI 31.1 kg/m2  SpO2 99% Wt Readings from Last 3 Encounters:  04/06/13 175 lb 8 oz (79.606 kg)  12/22/11 167 lb 8 oz (75.978 kg)  10/03/08 177 lb (80.287 kg)    General: Appears her stated age, well developed, well nourished in NAD. Cardiovascular: Normal rate and rhythm. S1,S2 noted.  No murmur, rubs or gallops noted. No JVD or BLE edema. No carotid bruits noted. Pulmonary/Chest: Normal effort and positive vesicular breath sounds. No respiratory distress. No wheezes, rales or ronchi noted.  Musculoskeletal: Normal range of motion. No signs of joint swelling. No difficulty with gait. Tender to palpation in the lumbar spine. Neurological: Alert and oriented. Cranial nerves II-XII intact. Coordination normal. +DTRs bilaterally. Positive straight leg raise.   BMET    Component Value Date/Time   NA 139 12/20/2011 0844   K 4.0 12/20/2011 0844   CL 109 12/20/2011 0844   CO2 23 12/20/2011 0844  GLUCOSE 107* 12/20/2011 0844   BUN 14 12/20/2011 0844   CREATININE 0.5 12/20/2011 0844   CALCIUM 8.5 12/20/2011 0844    Lipid Panel  No results found for this basename: chol, trig, hdl, cholhdl, vldl, ldlcalc    CBC    Component Value Date/Time   WBC 5.9 12/20/2011 0844   RBC 4.32 12/20/2011 0844   HGB 13.6 12/20/2011 0844   HCT 40.0 12/20/2011 0844   PLT 225.0 12/20/2011 0844   MCV 92.7 12/20/2011 0844   MCHC 34.1 12/20/2011 0844   RDW 13.3 12/20/2011 0844   LYMPHSABS 2.0 12/20/2011 0844   MONOABS 0.4 12/20/2011 0844   EOSABS 0.3 12/20/2011 0844   BASOSABS 0.0 12/20/2011 0844    Hgb A1C No results found for this basename: HGBA1C         Assessment & Plan:   Sciatica neuralgia, right, new onset:  Pt declines xray of lumbar spine- she has an appt with neurosurgeon on Tuesday eRx for pred taper eRx for flexeril Continue heating pad Avoid any vigerous activities over the weekend Stretching exercises provided  RTC as needed or if symptoms persist or worsen

## 2013-04-06 NOTE — Patient Instructions (Signed)
Back Exercises These exercises may help you when beginning to rehabilitate your injury. Your symptoms may resolve with or without further involvement from your physician, physical therapist or athletic trainer. While completing these exercises, remember:   Restoring tissue flexibility helps normal motion to return to the joints. This allows healthier, less painful movement and activity.  An effective stretch should be held for at least 30 seconds.  A stretch should never be painful. You should only feel a gentle lengthening or release in the stretched tissue. STRETCH  Extension, Prone on Elbows   Lie on your stomach on the floor, a bed will be too soft. Place your palms about shoulder width apart and at the height of your head.  Place your elbows under your shoulders. If this is too painful, stack pillows under your chest.  Allow your body to relax so that your hips drop lower and make contact more completely with the floor.  Hold this position for __________ seconds.  Slowly return to lying flat on the floor. Repeat __________ times. Complete this exercise __________ times per day.  RANGE OF MOTION  Extension, Prone Press Ups   Lie on your stomach on the floor, a bed will be too soft. Place your palms about shoulder width apart and at the height of your head.  Keeping your back as relaxed as possible, slowly straighten your elbows while keeping your hips on the floor. You may adjust the placement of your hands to maximize your comfort. As you gain motion, your hands will come more underneath your shoulders.  Hold this position __________ seconds.  Slowly return to lying flat on the floor. Repeat __________ times. Complete this exercise __________ times per day.  RANGE OF MOTION- Quadruped, Neutral Spine   Assume a hands and knees position on a firm surface. Keep your hands under your shoulders and your knees under your hips. You may place padding under your knees for comfort.  Drop  your head and point your tail bone toward the ground below you. This will round out your low back like an angry cat. Hold this position for __________ seconds.  Slowly lift your head and release your tail bone so that your back sags into a large arch, like an old horse.  Hold this position for __________ seconds.  Repeat this until you feel limber in your low back.  Now, find your "sweet spot." This will be the most comfortable position somewhere between the two previous positions. This is your neutral spine. Once you have found this position, tense your stomach muscles to support your low back.  Hold this position for __________ seconds. Repeat __________ times. Complete this exercise __________ times per day.  STRETCH  Flexion, Single Knee to Chest   Lie on a firm bed or floor with both legs extended in front of you.  Keeping one leg in contact with the floor, bring your opposite knee to your chest. Hold your leg in place by either grabbing behind your thigh or at your knee.  Pull until you feel a gentle stretch in your low back. Hold __________ seconds.  Slowly release your grasp and repeat the exercise with the opposite side. Repeat __________ times. Complete this exercise __________ times per day.  STRETCH - Hamstrings, Standing  Stand or sit and extend your right / left leg, placing your foot on a chair or foot stool  Keeping a slight arch in your low back and your hips straight forward.  Lead with your chest and   lean forward at the waist until you feel a gentle stretch in the back of your right / left knee or thigh. (When done correctly, this exercise requires leaning only a small distance.)  Hold this position for __________ seconds. Repeat __________ times. Complete this stretch __________ times per day. STRENGTHENING  Deep Abdominals, Pelvic Tilt   Lie on a firm bed or floor. Keeping your legs in front of you, bend your knees so they are both pointed toward the ceiling and  your feet are flat on the floor.  Tense your lower abdominal muscles to press your low back into the floor. This motion will rotate your pelvis so that your tail bone is scooping upwards rather than pointing at your feet or into the floor.  With a gentle tension and even breathing, hold this position for __________ seconds. Repeat __________ times. Complete this exercise __________ times per day.  STRENGTHENING  Abdominals, Crunches   Lie on a firm bed or floor. Keeping your legs in front of you, bend your knees so they are both pointed toward the ceiling and your feet are flat on the floor. Cross your arms over your chest.  Slightly tip your chin down without bending your neck.  Tense your abdominals and slowly lift your trunk high enough to just clear your shoulder blades. Lifting higher can put excessive stress on the low back and does not further strengthen your abdominal muscles.  Control your return to the starting position. Repeat __________ times. Complete this exercise __________ times per day.  STRENGTHENING  Quadruped, Opposite UE/LE Lift   Assume a hands and knees position on a firm surface. Keep your hands under your shoulders and your knees under your hips. You may place padding under your knees for comfort.  Find your neutral spine and gently tense your abdominal muscles so that you can maintain this position. Your shoulders and hips should form a rectangle that is parallel with the floor and is not twisted.  Keeping your trunk steady, lift your right hand no higher than your shoulder and then your left leg no higher than your hip. Make sure you are not holding your breath. Hold this position __________ seconds.  Continuing to keep your abdominal muscles tense and your back steady, slowly return to your starting position. Repeat with the opposite arm and leg. Repeat __________ times. Complete this exercise __________ times per day. Document Released: 07/09/2005 Document  Revised: 09/13/2011 Document Reviewed: 10/03/2008 [employer] Patient Information 2014 [employer].  

## 2013-12-26 ENCOUNTER — Other Ambulatory Visit: Payer: Self-pay | Admitting: Endocrinology

## 2013-12-26 DIAGNOSIS — E049 Nontoxic goiter, unspecified: Secondary | ICD-10-CM

## 2014-12-18 ENCOUNTER — Other Ambulatory Visit: Payer: Commercial Managed Care - PPO

## 2015-08-12 ENCOUNTER — Other Ambulatory Visit: Payer: Self-pay | Admitting: Endocrinology

## 2015-08-12 DIAGNOSIS — E049 Nontoxic goiter, unspecified: Secondary | ICD-10-CM

## 2015-08-15 ENCOUNTER — Ambulatory Visit
Admission: RE | Admit: 2015-08-15 | Discharge: 2015-08-15 | Disposition: A | Discharge status: Home / Self Care | Payer: PRIVATE HEALTH INSURANCE | Source: Ambulatory Visit | Attending: Endocrinology | Admitting: Endocrinology

## 2015-08-15 DIAGNOSIS — E049 Nontoxic goiter, unspecified: Secondary | ICD-10-CM

## 2015-08-15 IMAGING — US US SOFT TISSUE HEAD/NECK
1 series · 14 of 25 positions shown · non-contrast
Comparison: [DATE], [DATE] dating back to [DATE].

CLINICAL DATA: Followup goiter.

EXAM:
THYROID ULTRASOUND
TECHNIQUE: Ultrasound examination of the thyroid gland and adjacent soft
tissues was performed.

[Series 1: us soft tissue head/neck · 0.08mm/px · 14 of 47 slices shown]
[im 1/47]
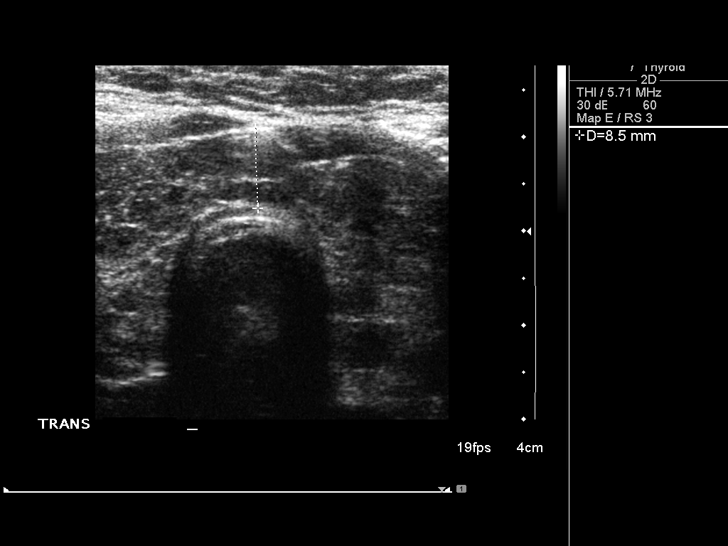
[im 4/47]
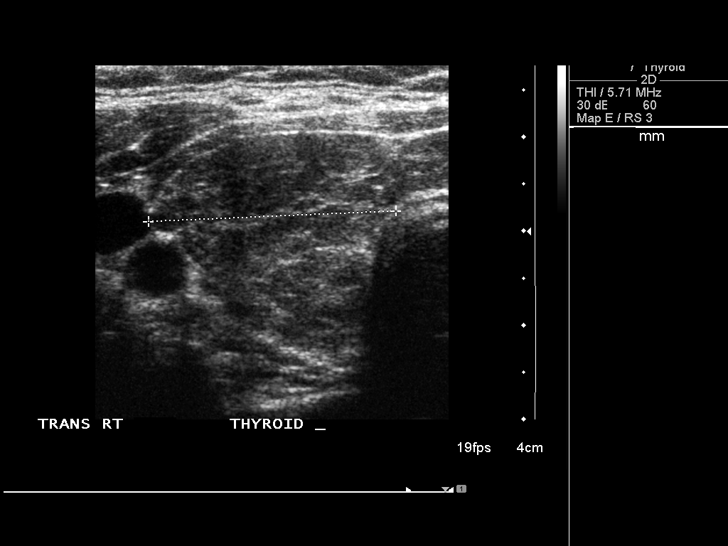
[im 8/47]
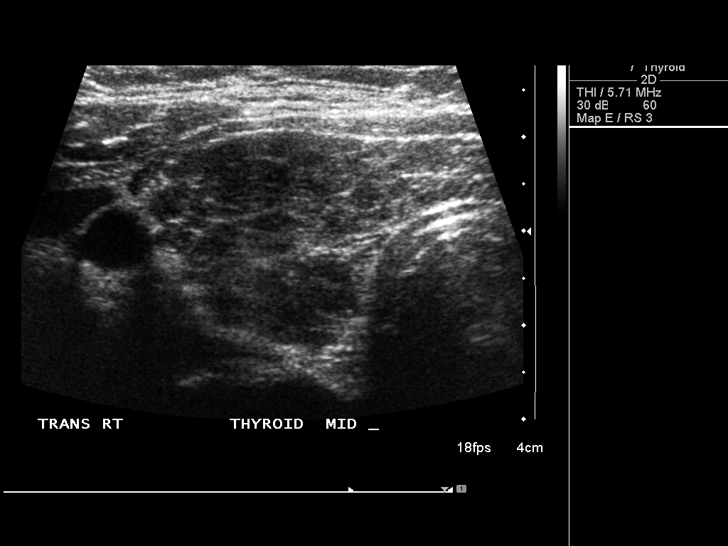
[im 12/47]
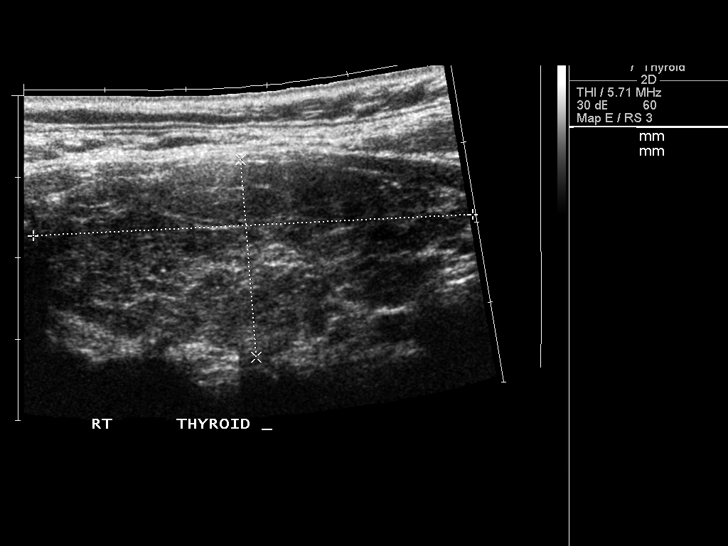
[im 16/47]
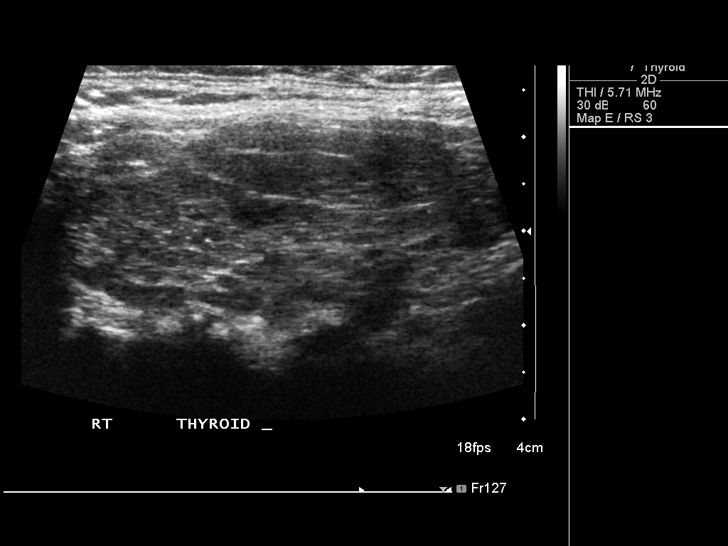
[im 18/47]
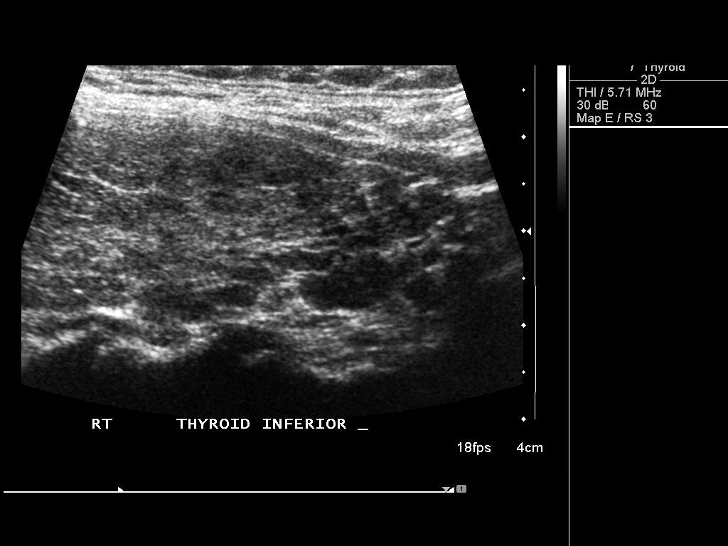
[im 22/47]
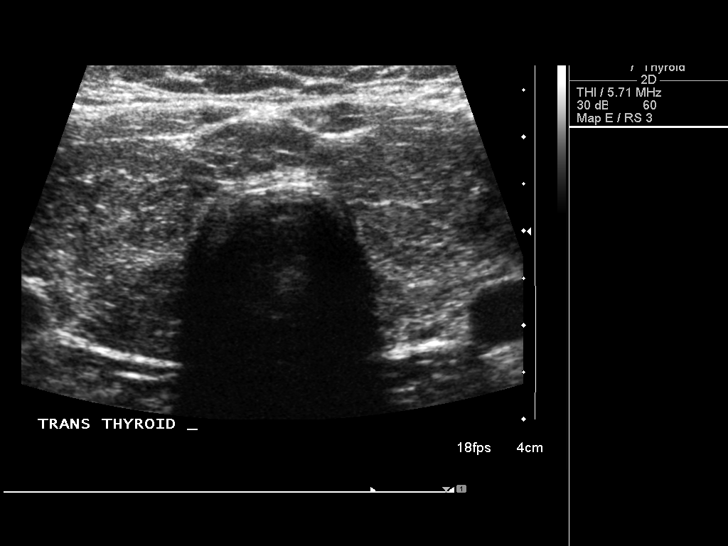
[im 25/47]
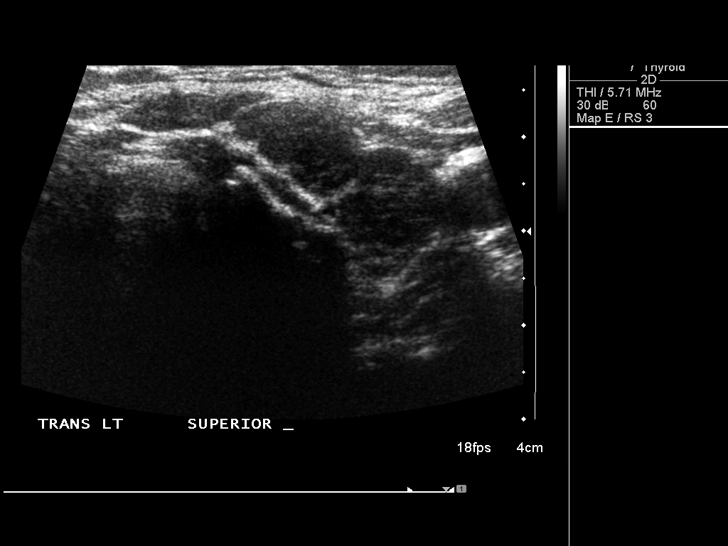
[im 29/47]
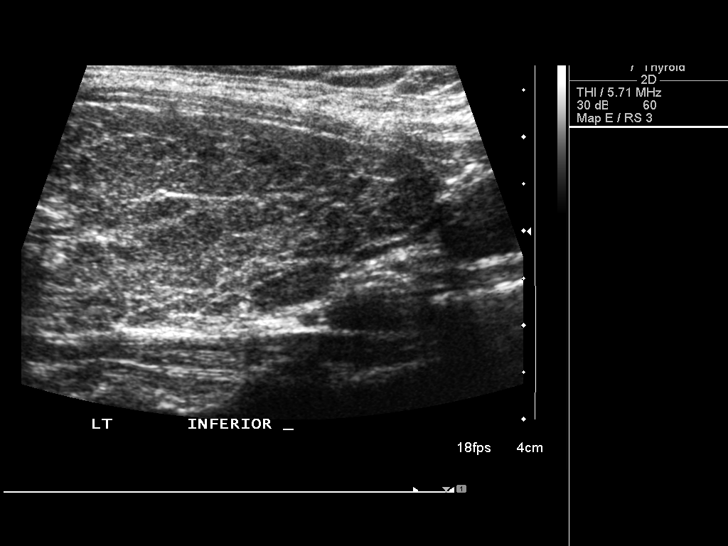
[im 31/47]
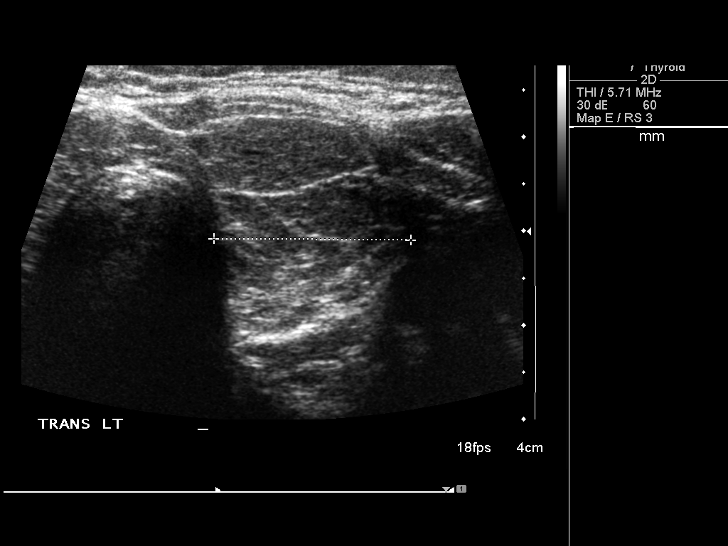
[im 35/47]
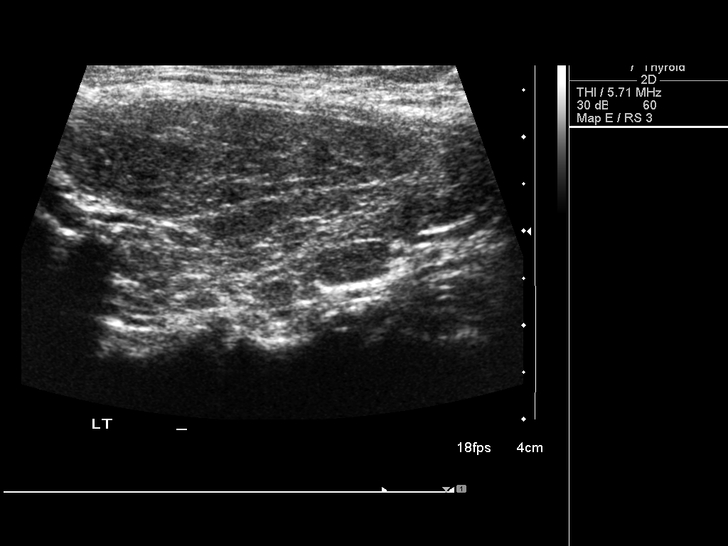
[im 39/47]
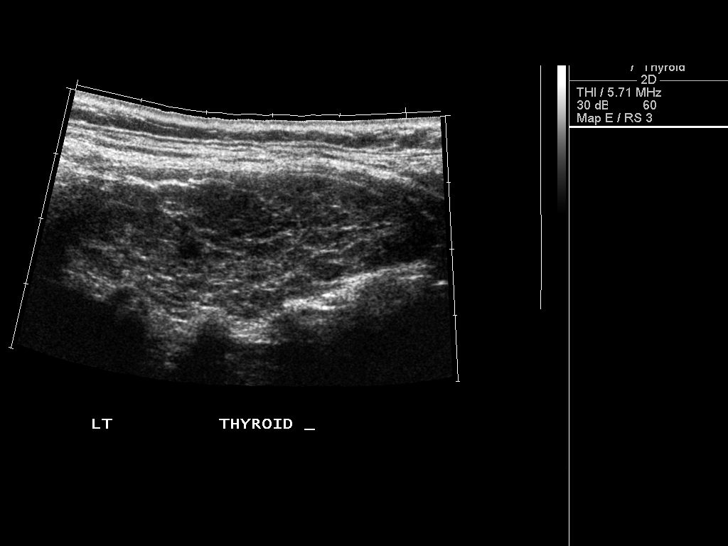
[im 43/47]
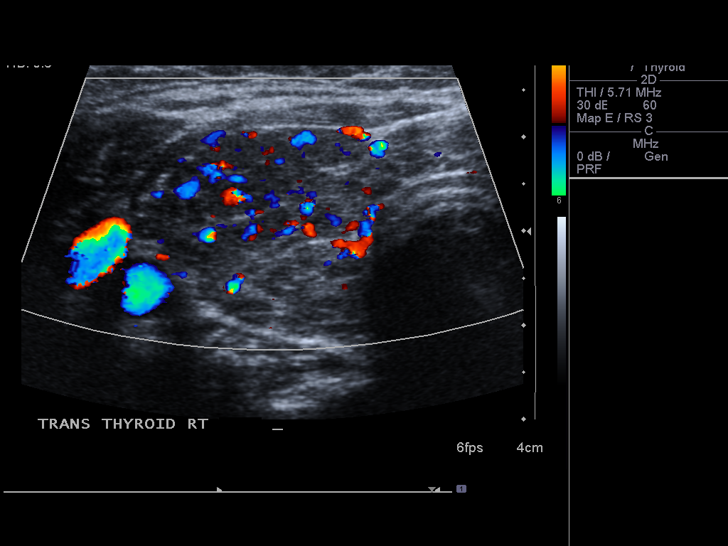
[im 47/47]
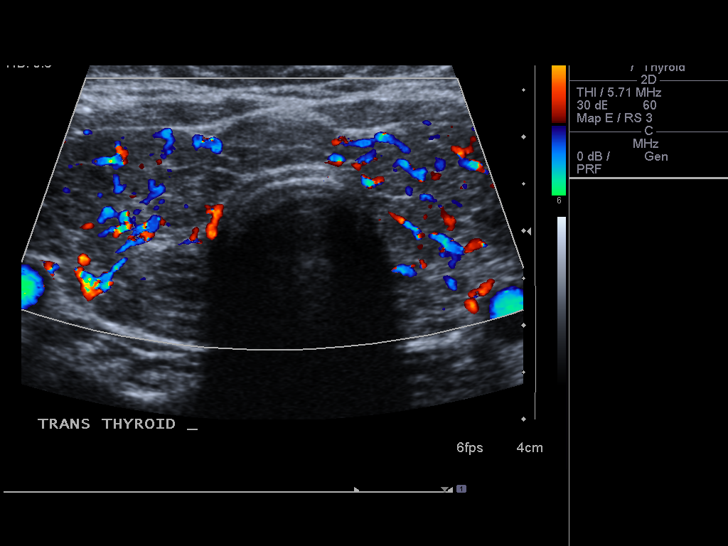

[14 of 25 positions shown; findings below may reference images not displayed]

FINDINGS: Right thyroid lobe

Measurements: Approximately 5.4 x 2.4 x 2.6 cm. Heterogeneous
echotexture as noted previously. No discrete nodules. Hyperemia on
color Doppler evaluation.

Left thyroid lobe

Measurements: Approximately 5.9 x 2.2 x 2.1 cm. Heterogeneous
echotexture as noted previously. Likely benign 4 mm left lower pole
colloid cyst identified previously is no longer visible. No discrete
nodules currently.

Isthmus

Thickness: Approximately 0.9 cm. Heterogeneous echotexture as noted
previously.. Hyperemia on color Doppler evaluation. Hyperemia on
color Doppler evaluation.

Lymphadenopathy

No significant lymphadenopathy.
IMPRESSION: Enlarged, heterogeneous thyroid gland not significantly changed
dating back to [RZ]. No discrete focal nodules.

Follow-up imaging should be based on clinical findings or laboratory
data, as routine followup imaging is not felt necessary.

## 2015-08-22 ENCOUNTER — Other Ambulatory Visit: Payer: Commercial Managed Care - PPO

## 2020-03-11 ENCOUNTER — Other Ambulatory Visit: Payer: Self-pay

## 2020-03-11 ENCOUNTER — Encounter (HOSPITAL_COMMUNITY): Payer: Self-pay

## 2020-03-11 ENCOUNTER — Emergency Department (HOSPITAL_COMMUNITY): Payer: BC Managed Care – PPO

## 2020-03-11 ENCOUNTER — Inpatient Hospital Stay (HOSPITAL_COMMUNITY)
Admission: EM | Admit: 2020-03-11 | Discharge: 2020-03-14 | DRG: 062 | Disposition: A | Discharge status: Home / Self Care | Payer: BC Managed Care – PPO | Attending: Neurology | Admitting: Neurology

## 2020-03-11 DIAGNOSIS — Z87891 Personal history of nicotine dependence: Secondary | ICD-10-CM

## 2020-03-11 DIAGNOSIS — Z20822 Contact with and (suspected) exposure to covid-19: Secondary | ICD-10-CM | POA: Diagnosis present

## 2020-03-11 DIAGNOSIS — E669 Obesity, unspecified: Secondary | ICD-10-CM | POA: Diagnosis present

## 2020-03-11 DIAGNOSIS — I361 Nonrheumatic tricuspid (valve) insufficiency: Secondary | ICD-10-CM | POA: Diagnosis not present

## 2020-03-11 DIAGNOSIS — E063 Autoimmune thyroiditis: Secondary | ICD-10-CM | POA: Diagnosis not present

## 2020-03-11 DIAGNOSIS — E039 Hypothyroidism, unspecified: Secondary | ICD-10-CM | POA: Diagnosis not present

## 2020-03-11 DIAGNOSIS — Q211 Atrial septal defect: Secondary | ICD-10-CM | POA: Diagnosis not present

## 2020-03-11 DIAGNOSIS — G9389 Other specified disorders of brain: Secondary | ICD-10-CM | POA: Diagnosis not present

## 2020-03-11 DIAGNOSIS — M6281 Muscle weakness (generalized): Secondary | ICD-10-CM | POA: Diagnosis not present

## 2020-03-11 DIAGNOSIS — Z79890 Hormone replacement therapy (postmenopausal): Secondary | ICD-10-CM | POA: Diagnosis not present

## 2020-03-11 DIAGNOSIS — I639 Cerebral infarction, unspecified: Secondary | ICD-10-CM | POA: Diagnosis not present

## 2020-03-11 DIAGNOSIS — E785 Hyperlipidemia, unspecified: Secondary | ICD-10-CM | POA: Diagnosis present

## 2020-03-11 DIAGNOSIS — Z8774 Personal history of (corrected) congenital malformations of heart and circulatory system: Secondary | ICD-10-CM

## 2020-03-11 DIAGNOSIS — M50222 Other cervical disc displacement at C5-C6 level: Secondary | ICD-10-CM | POA: Diagnosis not present

## 2020-03-11 DIAGNOSIS — Z9282 Status post administration of tPA (rtPA) in a different facility within the last 24 hours prior to admission to current facility: Secondary | ICD-10-CM | POA: Diagnosis not present

## 2020-03-11 DIAGNOSIS — J3489 Other specified disorders of nose and nasal sinuses: Secondary | ICD-10-CM | POA: Diagnosis not present

## 2020-03-11 DIAGNOSIS — R531 Weakness: Secondary | ICD-10-CM | POA: Diagnosis not present

## 2020-03-11 DIAGNOSIS — E7800 Pure hypercholesterolemia, unspecified: Secondary | ICD-10-CM | POA: Diagnosis not present

## 2020-03-11 DIAGNOSIS — Z7952 Long term (current) use of systemic steroids: Secondary | ICD-10-CM | POA: Diagnosis not present

## 2020-03-11 DIAGNOSIS — M40202 Unspecified kyphosis, cervical region: Secondary | ICD-10-CM | POA: Diagnosis not present

## 2020-03-11 DIAGNOSIS — R29702 NIHSS score 2: Secondary | ICD-10-CM | POA: Diagnosis present

## 2020-03-11 DIAGNOSIS — R9431 Abnormal electrocardiogram [ECG] [EKG]: Secondary | ICD-10-CM | POA: Diagnosis not present

## 2020-03-11 DIAGNOSIS — J341 Cyst and mucocele of nose and nasal sinus: Secondary | ICD-10-CM | POA: Diagnosis not present

## 2020-03-11 DIAGNOSIS — I959 Hypotension, unspecified: Secondary | ICD-10-CM | POA: Diagnosis not present

## 2020-03-11 DIAGNOSIS — Z8639 Personal history of other endocrine, nutritional and metabolic disease: Secondary | ICD-10-CM | POA: Diagnosis not present

## 2020-03-11 DIAGNOSIS — I6389 Other cerebral infarction: Secondary | ICD-10-CM | POA: Diagnosis not present

## 2020-03-11 DIAGNOSIS — Z6835 Body mass index (BMI) 35.0-35.9, adult: Secondary | ICD-10-CM

## 2020-03-11 DIAGNOSIS — J338 Other polyp of sinus: Secondary | ICD-10-CM | POA: Diagnosis not present

## 2020-03-11 DIAGNOSIS — R29818 Other symptoms and signs involving the nervous system: Secondary | ICD-10-CM | POA: Diagnosis not present

## 2020-03-11 DIAGNOSIS — I081 Rheumatic disorders of both mitral and tricuspid valves: Secondary | ICD-10-CM | POA: Diagnosis not present

## 2020-03-11 DIAGNOSIS — G8191 Hemiplegia, unspecified affecting right dominant side: Secondary | ICD-10-CM | POA: Diagnosis present

## 2020-03-11 DIAGNOSIS — M47812 Spondylosis without myelopathy or radiculopathy, cervical region: Secondary | ICD-10-CM | POA: Diagnosis not present

## 2020-03-11 DIAGNOSIS — R29810 Facial weakness: Secondary | ICD-10-CM | POA: Diagnosis not present

## 2020-03-11 LAB — COMPREHENSIVE METABOLIC PANEL
ALT: 20 U/L (ref 0–44)
AST: 20 U/L (ref 15–41)
Albumin: 4 g/dL (ref 3.5–5.0)
Alkaline Phosphatase: 71 U/L (ref 38–126)
Anion gap: 12 (ref 5–15)
BUN: 8 mg/dL (ref 6–20)
CO2: 21 mmol/L — ABNORMAL LOW (ref 22–32)
Calcium: 8.8 mg/dL — ABNORMAL LOW (ref 8.9–10.3)
Chloride: 105 mmol/L (ref 98–111)
Creatinine, Ser: 0.76 mg/dL (ref 0.44–1.00)
GFR calc Af Amer: >60 mL/min (ref >60)
GFR calc non Af Amer: >60 mL/min (ref >60)
Glucose, Bld: 114 mg/dL — ABNORMAL HIGH (ref 70–99)
Potassium: 3.4 mmol/L — ABNORMAL LOW (ref 3.5–5.1)
Sodium: 138 mmol/L (ref 135–145)
Total Bilirubin: 0.9 mg/dL (ref 0.3–1.2)
Total Protein: 6.9 g/dL (ref 6.5–8.1)

## 2020-03-11 LAB — PROTIME-INR
INR: 0.9 (ref 0.8–1.2)
Prothrombin Time: 12.1 seconds (ref 11.4–15.2)

## 2020-03-11 LAB — I-STAT BETA HCG BLOOD, ED (MC, WL, AP ONLY): I-stat hCG, quantitative: <5 m[IU]/mL (ref <5)

## 2020-03-11 LAB — I-STAT CHEM 8, ED
BUN: 8 mg/dL (ref 6–20)
Calcium, Ion: 1.08 mmol/L — ABNORMAL LOW (ref 1.15–1.40)
Chloride: 104 mmol/L (ref 98–111)
Creatinine, Ser: 0.5 mg/dL (ref 0.44–1.00)
Glucose, Bld: 110 mg/dL — ABNORMAL HIGH (ref 70–99)
HCT: 39 % (ref 36.0–46.0)
Hemoglobin: 13.3 g/dL (ref 12.0–15.0)
Potassium: 3.4 mmol/L — ABNORMAL LOW (ref 3.5–5.1)
Sodium: 140 mmol/L (ref 135–145)
TCO2: 22 mmol/L (ref 22–32)

## 2020-03-11 LAB — CBG MONITORING, ED: Glucose-Capillary: 112 mg/dL — ABNORMAL HIGH (ref 70–99)

## 2020-03-11 LAB — DIFFERENTIAL
Abs Immature Granulocytes: 0.02 10*3/uL (ref 0.00–0.07)
Basophils Absolute: 0.1 10*3/uL (ref 0.0–0.1)
Basophils Relative: 1 %
Eosinophils Absolute: 0.2 10*3/uL (ref 0.0–0.5)
Eosinophils Relative: 3 %
Immature Granulocytes: 0 %
Lymphocytes Relative: 38 %
Lymphs Abs: 2.6 10*3/uL (ref 0.7–4.0)
Monocytes Absolute: 0.5 10*3/uL (ref 0.1–1.0)
Monocytes Relative: 7 %
Neutro Abs: 3.5 10*3/uL (ref 1.7–7.7)
Neutrophils Relative %: 51 %

## 2020-03-11 LAB — SARS CORONAVIRUS 2 BY RT PCR (HOSPITAL ORDER, PERFORMED IN ~~LOC~~ HOSPITAL LAB): SARS Coronavirus 2: NEGATIVE

## 2020-03-11 LAB — CBC
HCT: 38.3 % (ref 36.0–46.0)
Hemoglobin: 13.1 g/dL (ref 12.0–15.0)
MCH: 31.6 pg (ref 26.0–34.0)
MCHC: 34.2 g/dL (ref 30.0–36.0)
MCV: 92.5 fL (ref 80.0–100.0)
Platelets: 226 10*3/uL (ref 150–400)
RBC: 4.14 MIL/uL (ref 3.87–5.11)
RDW: 12.2 % (ref 11.5–15.5)
WBC: 6.9 10*3/uL (ref 4.0–10.5)
nRBC: 0 % (ref 0.0–0.2)

## 2020-03-11 LAB — APTT: aPTT: 23 seconds — ABNORMAL LOW (ref 24–36)

## 2020-03-11 IMAGING — CT CT HEAD CODE STROKE
3 series · 15 of 47 positions shown, 18 images · non-contrast
Comparison: None.

CLINICAL DATA: Code stroke.  Right-sided weakness

EXAM:
CT HEAD WITHOUT CONTRAST
TECHNIQUE: Contiguous axial images were obtained from the base of the skull
through the vertex without intravenous contrast.

[Series 3: head 5.0 h30s · axial · 0.44mm/px · z∈[-115,+30]mm · 9 of 35 slices shown, 12 images]
[im 3/35  brain]
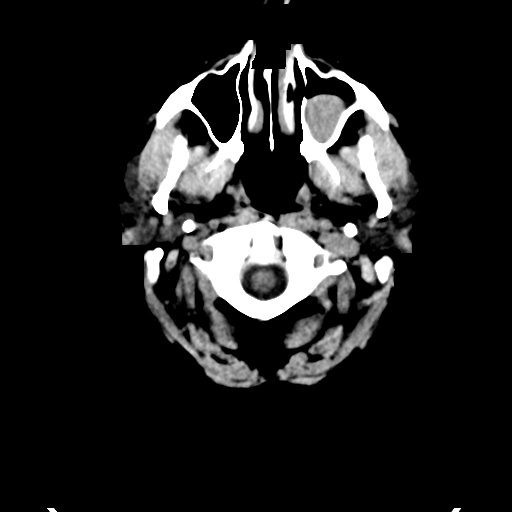
[im 3/35  bone]
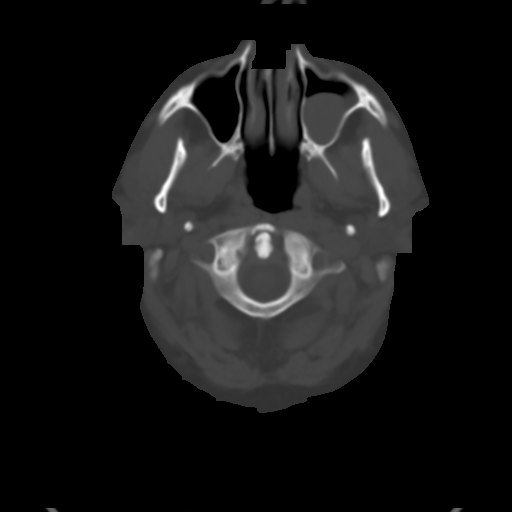
[im 6/35  brain]
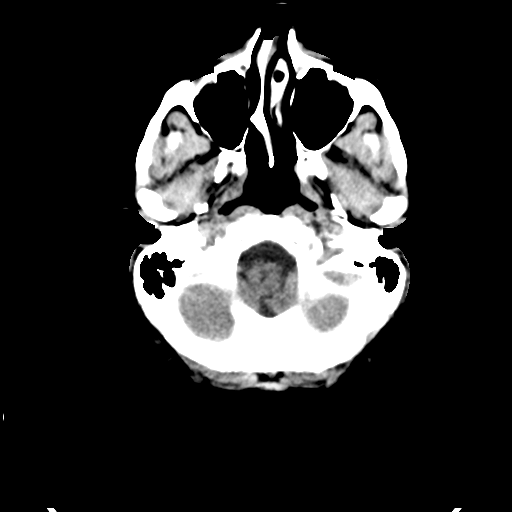
[im 10/35  brain]
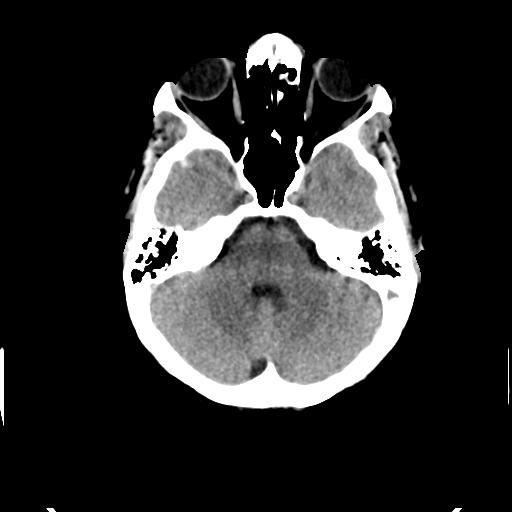
[im 13/35  brain]
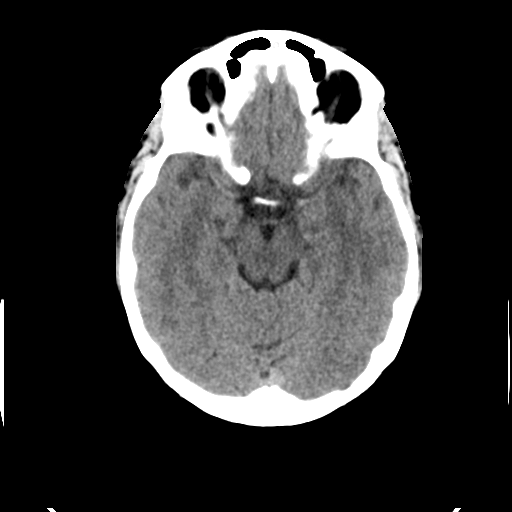
[im 18/35  brain]
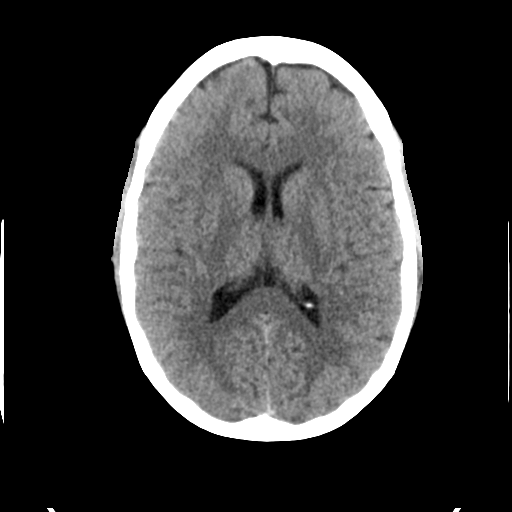
[im 18/35  bone]
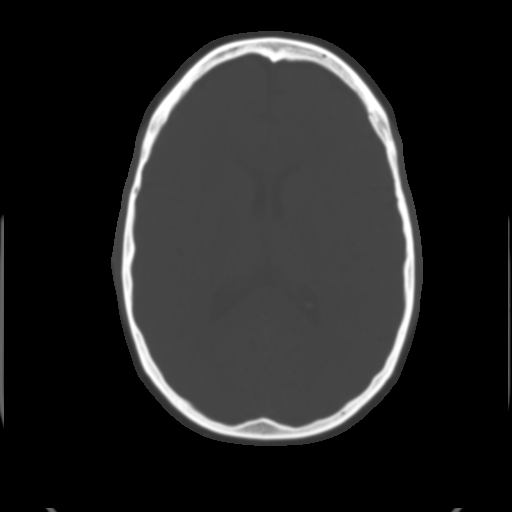
[im 22/35  brain]
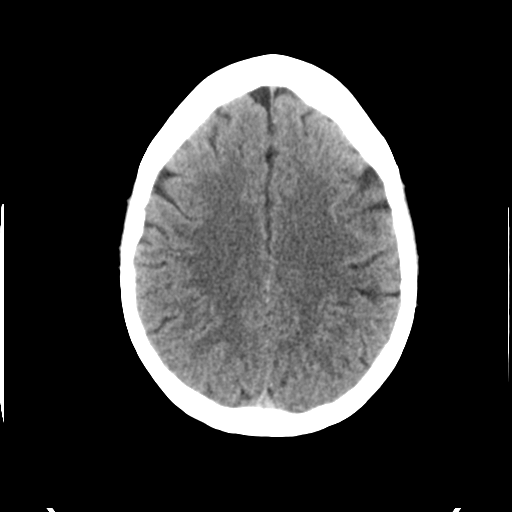
[im 25/35  brain]
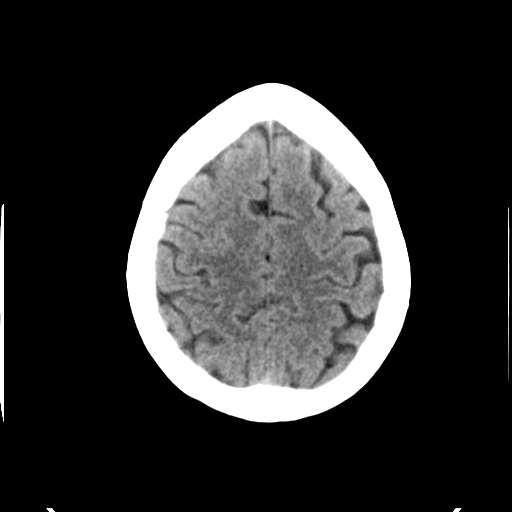
[im 29/35  brain]
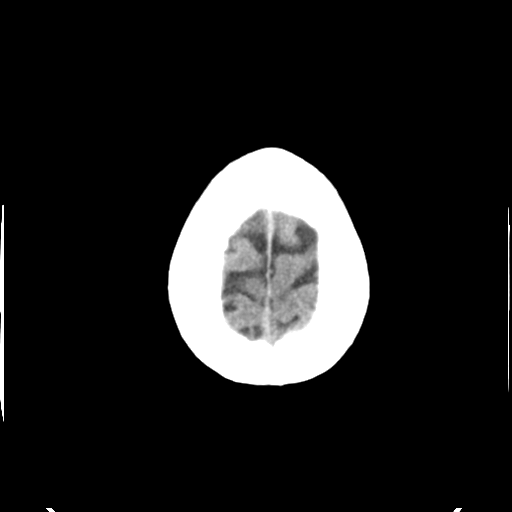
[im 32/35  brain]
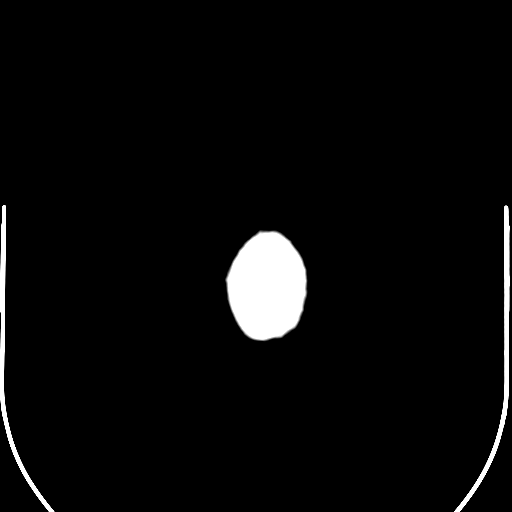
[im 32/35  bone]
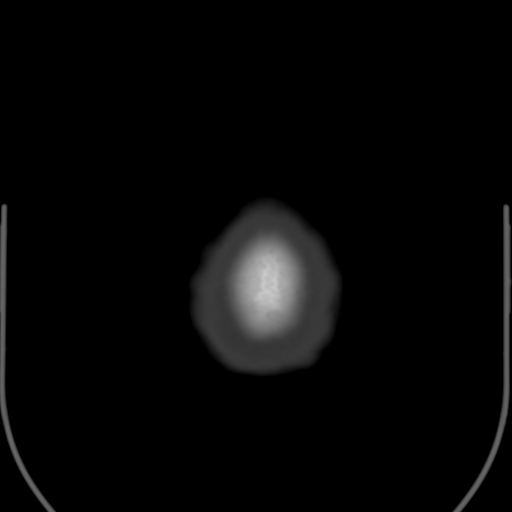

[Series 5: head 3.0 mpr cor · coronal · 0.30mm/px · 3 of 67 slices shown]
[im 23/67  brain]
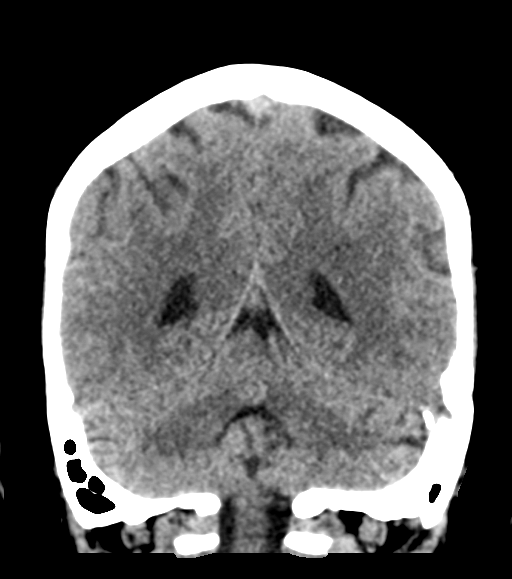
[im 30/67  brain]
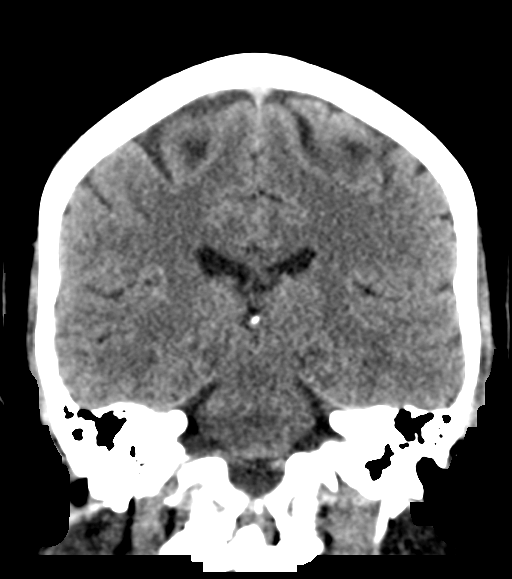
[im 37/67  brain]
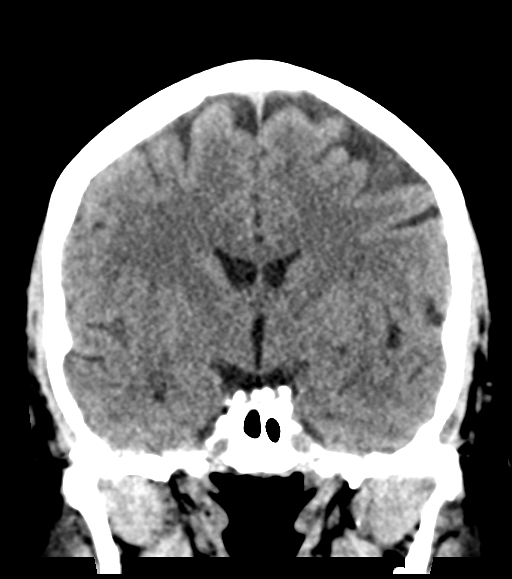

[Series 6: head 3.0 mpr sag · sagittal · 0.34mm/px · 3 of 65 slices shown]
[im 22/65  brain]
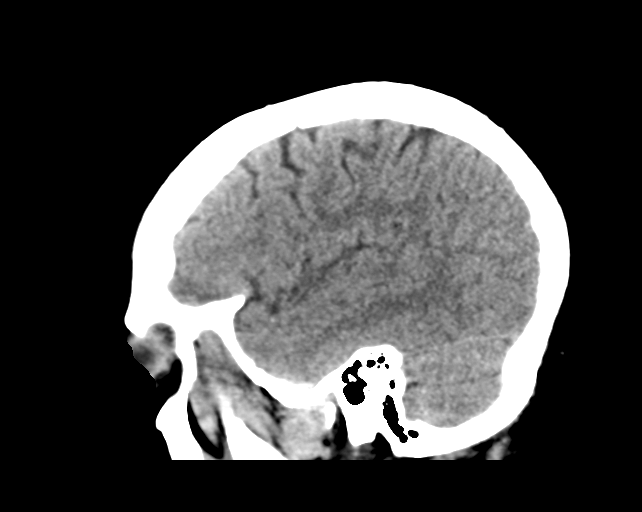
[im 33/65  brain]
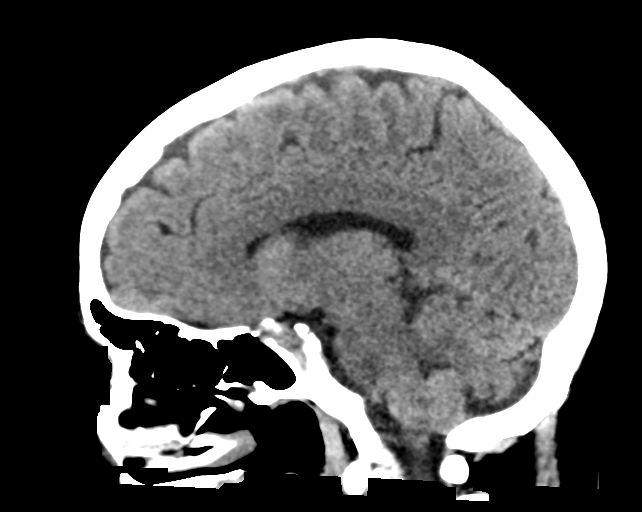
[im 43/65  brain]
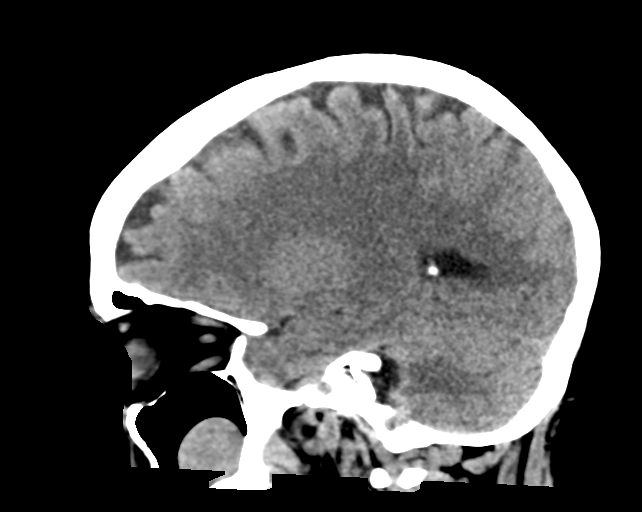

[15 of 47 positions shown; findings below may reference images not displayed]

FINDINGS: Brain: No acute intracranial hemorrhage, mass effect, or edema.
Question of small area of loss of gray-white differentiation in the
left parietal lobe (series 3, images 21 and 22). Gray-white
differentiation is otherwise preserved. Ventricles and sulci are
normal in size and configuration. There is no extra-axial fluid
collection.

Vascular: No hyperdense vessel.

Skull: Unremarkable.

Sinuses/Orbits: Left maxillary sinus retention cyst. Left frontal
sinus mucosal thickening with occlusion of outflow. Orbits are
unremarkable.

Other: Mastoid air cells are clear.

ASPECTS (Alberta Stroke Program Early CT Score)

- Ganglionic level infarction (caudate, lentiform nuclei, internal
capsule, insula, M1-M3 cortex): 7

- Supraganglionic infarction (M4-M6 cortex): 2-3

Total score (0-10 with 10 being normal): 9-10
IMPRESSION: No acute intracranial hemorrhage or definite evidence of acute
infarction. Questionable small area of loss of gray-white
differentiation in the left parietal lobe. ASPECT score is 9-10.

Initial results were communicated to Dr. BARRY at [DATE] pmon
[DATE]by text page via the [employer] messaging system.

## 2020-03-11 IMAGING — CT CT ANGIO HEAD-NECK
3 of 13 series · 16 of 47 positions shown · IV contrast (OMNI)
Comparison: None.

CLINICAL DATA: Right-sided weakness

EXAM:
CT ANGIOGRAPHY HEAD AND NECK
TECHNIQUE: Multidetector CT imaging of the head and neck was performed using
the standard protocol during bolus administration of intravenous
contrast. Multiplanar CT image reconstructions and MIPs were
obtained to evaluate the vascular anatomy. Carotid stenosis
measurements (when applicable) are obtained utilizing NASCET
criteria, using the distal internal carotid diameter as the
denominator.
CONTRAST:  75mL OMNIPAQUE IOHEXOL 350 MG/ML SOLN

[Series 11: arterial thin · axial · arterial · 0.41mm/px · z∈[-298,-5]mm · 13 of 684 slices shown]
[im 49/684  brain]
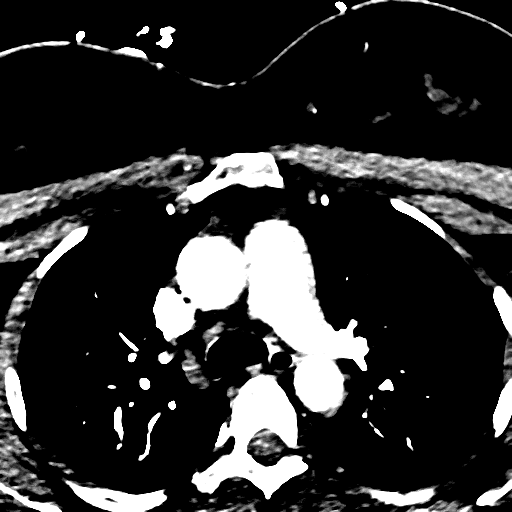
[im 98/684  bone]
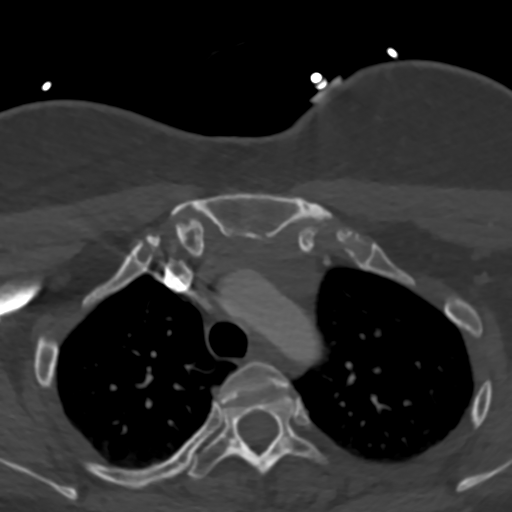
[im 147/684  brain]
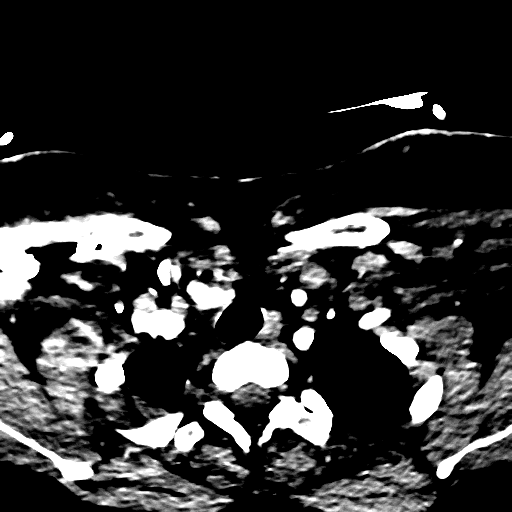
[im 196/684  bone]
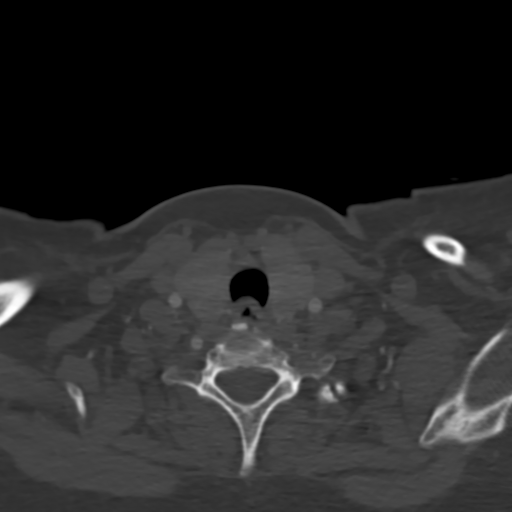
[im 244/684  brain]
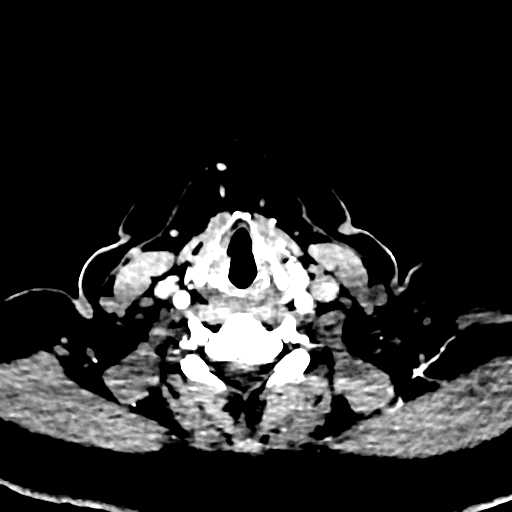
[im 293/684  bone]
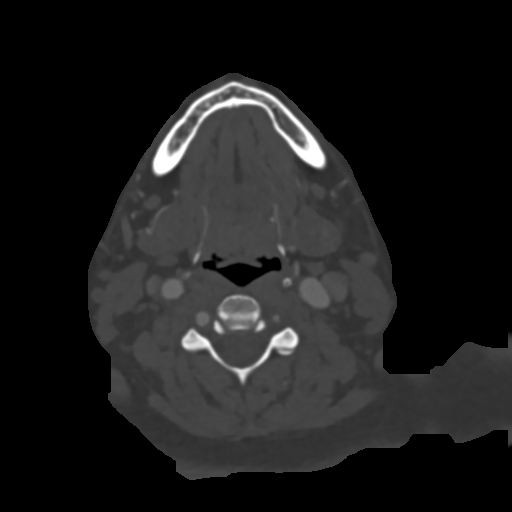
[im 342/684  brain]
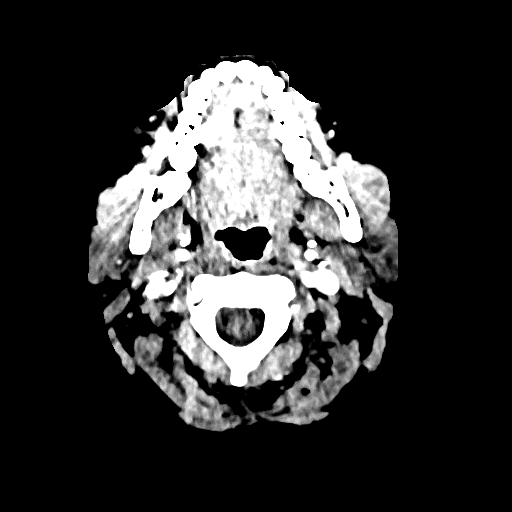
[im 391/684  bone]
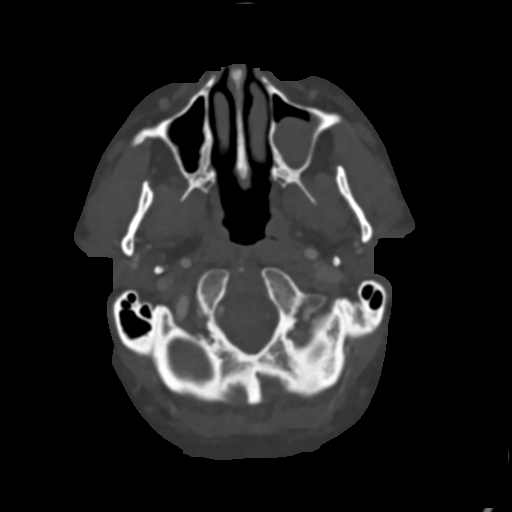
[im 440/684  brain]
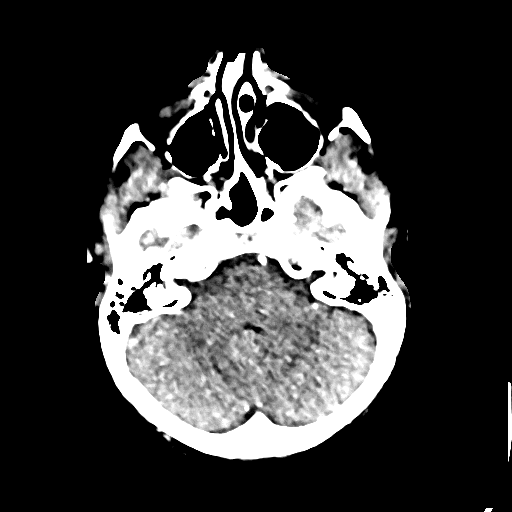
[im 488/684  bone]
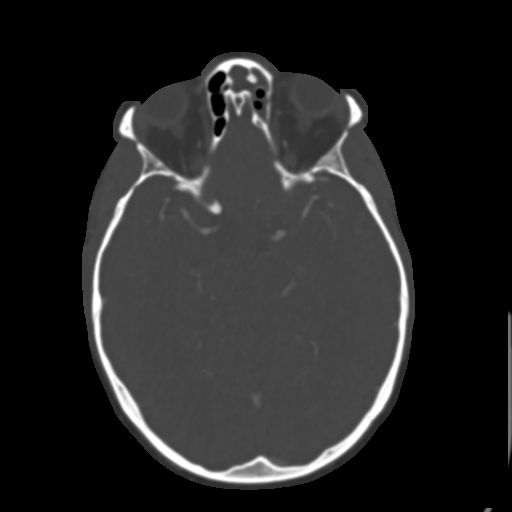
[im 537/684  brain]
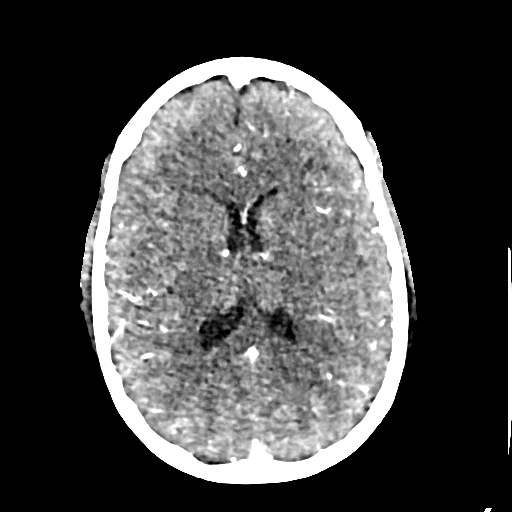
[im 586/684  bone]
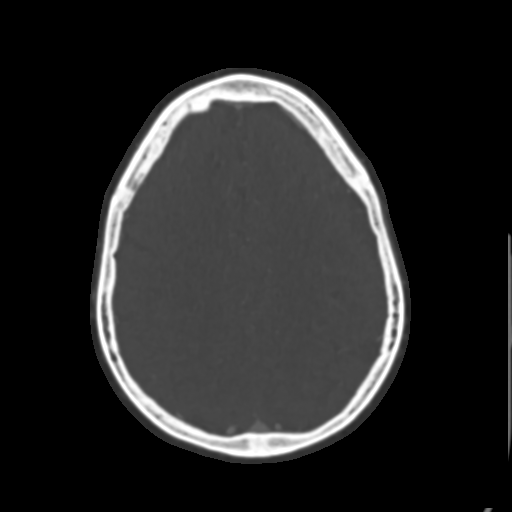
[im 635/684  brain]
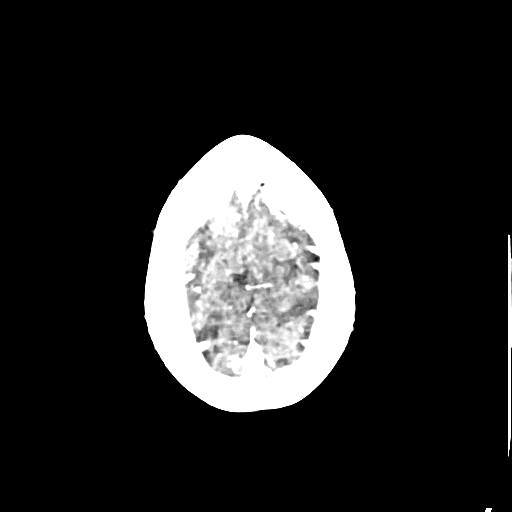

[Series 14: early thick sag · sagittal · arterial · 0.59mm/px · 1 of 61 slices shown]
[im 31/61  brain]
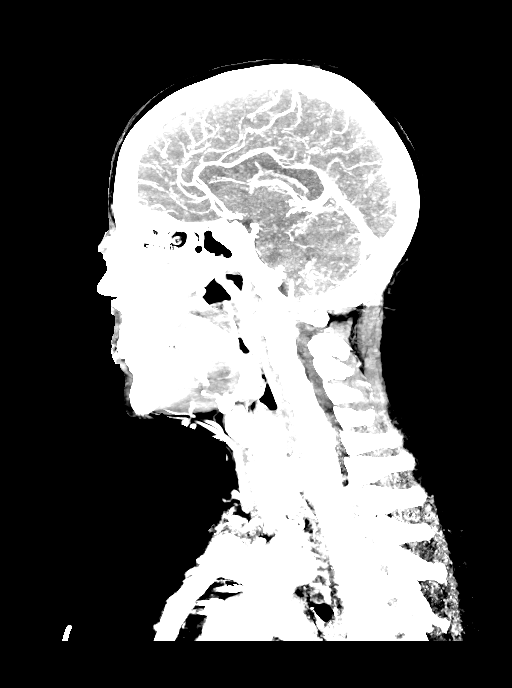

[Series 16: arterial late cor · coronal · arterial · 0.59mm/px · 2 of 61 slices shown]
[im 21/61  brain]
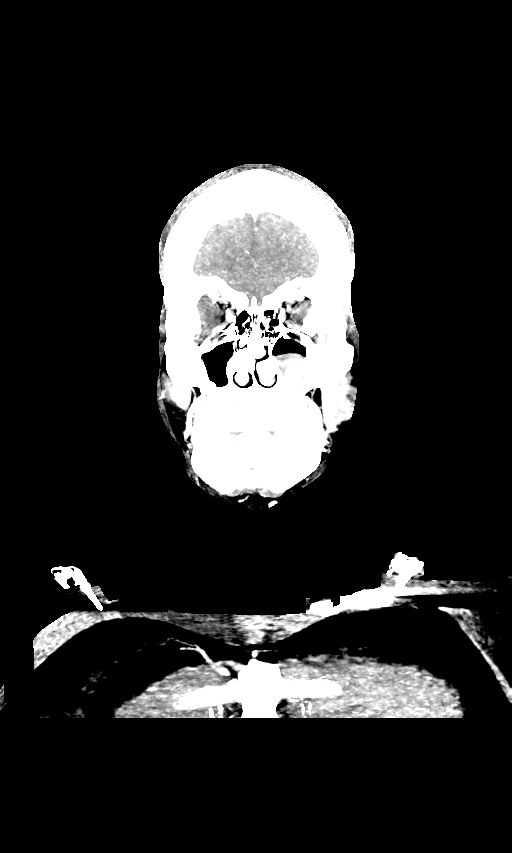
[im 41/61  brain]
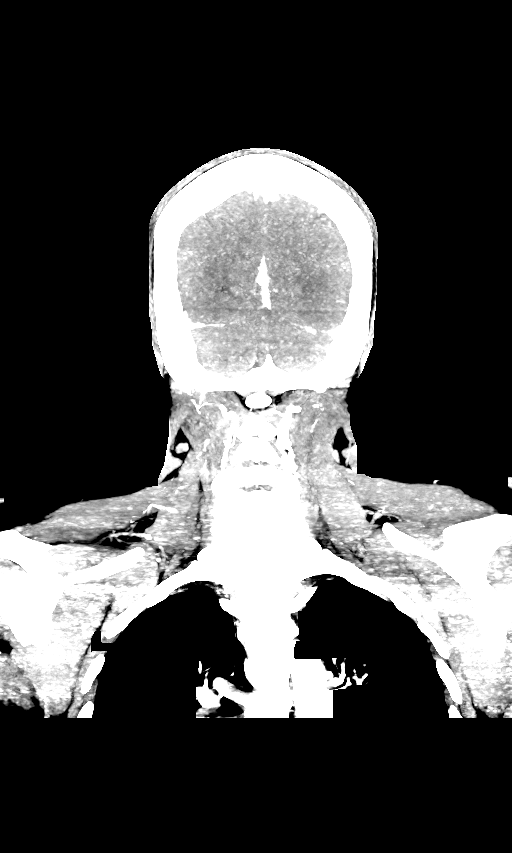

[16 of 47 positions shown; findings below may reference images not displayed]

FINDINGS: Motion artifact is present but study is diagnostic.

CTA NECK

Aortic arch: Great vessel origins are patent.

Right carotid system: Patent. No measurable stenosis or evidence of
dissection.

Left carotid system: Patent. No measurable stenosis or evidence of
dissection.

Vertebral arteries: Patent. Right vertebral artery is dominant with
small caliber left vertebral artery. No measurable stenosis or
evidence of dissection.

Skeleton: Cervical spine degenerative changes primarily at C5-C6.

Other neck: No mass or adenopathy.

Upper chest: No apical lung mass.

Review of the MIP images confirms the above findings

CTA HEAD

Anterior circulation: Intracranial internal carotid arteries patent.
Anterior and middle cerebral arteries are patent.

Posterior circulation: Intracranial vertebral arteries patent. Left
vertebral artery terminates as a PICA. Basilar artery is patent.
Posterior cerebral arteries are.

Venous sinuses: Patent as allowed by contrast bolus timing.

Review of the MIP images confirms the above findings
IMPRESSION: No large vessel occlusion or hemodynamically significant stenosis.

Initial results were discussed by telephone at the time of
interpretation on [DATE] at [DATE] with provider AUJLA ,
who verbally acknowledged these results.

## 2020-03-11 MED ORDER — SODIUM CHLORIDE 0.9 % IV SOLN
50.0000 mL | Freq: Once | INTRAVENOUS | Status: AC
  Administered 2020-03-11: 50 mL via INTRAVENOUS

## 2020-03-11 MED ORDER — IOHEXOL 350 MG/ML SOLN
75.0000 mL | Freq: Once | INTRAVENOUS | Status: AC | PRN
  Administered 2020-03-11: 75 mL via INTRAVENOUS

## 2020-03-11 MED ORDER — ACETAMINOPHEN 650 MG RE SUPP: 650.0000 mg | RECTAL | Status: DC | PRN

## 2020-03-11 MED ORDER — IOHEXOL 350 MG/ML SOLN: 75.0000 mL | Freq: Once | INTRAVENOUS | Status: DC | PRN

## 2020-03-11 MED ORDER — ACETAMINOPHEN 160 MG/5ML PO SOLN: 650.0000 mg | ORAL | Status: DC | PRN

## 2020-03-11 MED ORDER — SODIUM CHLORIDE 0.9% FLUSH
3.0000 mL | Freq: Once | INTRAVENOUS | Status: AC
  Administered 2020-03-11: 3 mL via INTRAVENOUS

## 2020-03-11 MED ORDER — LABETALOL HCL 5 MG/ML IV SOLN: 20.0000 mg | Freq: Once | INTRAVENOUS | Status: DC

## 2020-03-11 MED ORDER — SENNOSIDES-DOCUSATE SODIUM 8.6-50 MG PO TABS: 1.0000 | ORAL_TABLET | Freq: Every evening | ORAL | Status: DC | PRN

## 2020-03-11 MED ORDER — STROKE: EARLY STAGES OF RECOVERY BOOK
Freq: Once | Status: AC
  Filled 2020-03-11: qty 1

## 2020-03-11 MED ORDER — SODIUM CHLORIDE 0.9 % IV SOLN: INTRAVENOUS | Status: DC

## 2020-03-11 MED ORDER — ACETAMINOPHEN 325 MG PO TABS: 650.0000 mg | ORAL_TABLET | ORAL | Status: DC | PRN

## 2020-03-11 MED ORDER — ALTEPLASE (STROKE) FULL DOSE INFUSION
0.9000 mg/kg | Freq: Once | INTRAVENOUS | Status: AC
  Administered 2020-03-11: 81.1 mg via INTRAVENOUS
  Filled 2020-03-11: qty 100

## 2020-03-11 MED ORDER — PANTOPRAZOLE SODIUM 40 MG IV SOLR
40.0000 mg | Freq: Every day | INTRAVENOUS | Status: DC
  Administered 2020-03-11: 40 mg via INTRAVENOUS
  Filled 2020-03-11: qty 40

## 2020-03-11 MED ORDER — CLEVIDIPINE BUTYRATE 0.5 MG/ML IV EMUL: 0.0000 mg/h | INTRAVENOUS | Status: DC

## 2020-03-11 NOTE — Code Documentation (Signed)
Patient was driving back to work from home and noticed at a stop sign that her right arm and leg were very heavy and she could barely lift them. She pulled into work and called her boss who came out to the parking lot and called 911. [facility] arrived and called a code stroke. Pt was taken to [facility] and cleared for CT. She has an NIHSS 2 for drift in the right upper and lower extremities. CT negative for bleed according to MD. Patient's LKW was 1355. No hx of blood thinner medications. Pt had heart surgery when she was in her teens. She is within the window for TPA. BP 130/70.TPA started @ 1441. Husband is now at bedside. Care Plan: Pt will be admitted to ICU for frequent neuro checks. Vitals/mNIHSS q15x2hrs, q30x6hrs, q1x16hrs. Stroke admission orders written. Charge on 4NICU aware of bed need. Hand off with Verlon Au, RN. Valborg Friar, [place] Scrape, RN

## 2020-03-11 NOTE — ED Triage Notes (Signed)
Per [facility] pt was driving down the road, while at a stop sign she noticed her Right arm felt heavy and hard to lift, she then noticed heaviness in her Right leg along with her head feeling heavy. Pt called 911. LKW 1355  NIH 2  18G LLW

## 2020-03-11 NOTE — H&P (Addendum)
Stroke neurology history and physical  CC: Weakness right side  History is obtained from: Patient, chart  HPI: [name] is a 49 y.o. 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  She attempted to move her arm and leg but found it very heavy and difficult to move the arm and leg.  EMS was called.  They evaluated her and found right arm and leg drift, and brought her in as an acute code stroke. Denies headaches or history of migraines. Denies similar symptoms in the past. Denies prior history of strokes Denies sick contacts.  Denies chest pain shortness of breath nausea vomiting.  Denies visual symptoms. No recent travel-no history of clots in legs.   LKW: 1:55 PM 03/11/2020 tpa given?:  Yes Premorbid modified Rankin scale (mRS): 0  ROS: Obtained and negative except as noted in HPI.   Past Medical History:  Diagnosis Date  . No diagnosis     No family history on file.   Social History:   reports that she has quit smoking. She has never used smokeless tobacco. No history on file for alcohol use and drug use.  Medications  Current Facility-Administered Medications:  .  alteplase (ACTIVASE) 1 mg/mL infusion 81.1 mg, 0.9 mg/kg, Intravenous, Once **FOLLOWED BY** 0.9 %  sodium chloride infusion, 50 mL, Intravenous, Once, [name], MD .  iohexol (OMNIPAQUE) 350 MG/ML injection 75 mL, 75 mL, Intravenous, Once PRN, [name], MD .  sodium chloride flush (NS) 0.9 % injection 3 mL, 3 mL, Intravenous, Once, Cathren Laine, MD  Current Outpatient Medications:  .  cyclobenzaprine (FLEXERIL) 10 MG tablet, Take 1 tablet (10 mg total) by  mouth 3 (three) times daily as needed for muscle spasms., Disp: 30 tablet, Rfl: 0 .  levothyroxine (SYNTHROID, LEVOTHROID) 137 MCG tablet, Take 137 mcg by mouth daily., Disp: , Rfl:  .  naproxen sodium (ANAPROX) 220 MG tablet, Take 220 mg by mouth as needed., Disp: , Rfl:  .  predniSONE (DELTASONE) 10 MG tablet, Take 3 tablets on days 1-2, take 2 tablets on days 3-4, take 3 tablets on days 5-6, Disp: 12 tablet, Rfl: 0  Exam: Current vital signs: BP (!) 113/50   Pulse 79   Resp 14   Wt 90.1 kg   BMI 35.19 kg/m  Vital signs in last 24 hours: Pulse Rate:  [79-92] 79 (09/07 1500) Resp:  [14-15] 14 (09/07 1500) BP: (113-137)/(50-94) 113/50 (09/07 1500) Weight:  [90.1 kg] 90.1 kg (09/07 1400)  GENERAL: Awake, alert in NAD HEENT: - Normocephalic and atraumatic, dry mm, no LN++, no Thyromegally LUNGS - Clear to auscultation bilaterally with no wheezes CV - S1S2 RRR, no m/r/g, equal pulses bilaterally. ABDOMEN - Soft, nontender, nondistended with normoactive BS Ext: warm, well perfused, intact peripheral pulses, no edema  NEURO:  Mental Status: AA&Ox3  Language: speech is fluent and nondysarthric.  Naming, repetition, fluency, and comprehension intact. Cranial Nerves: PERRL EOMI, visual fields full, no facial asymmetry facial sensation intact, hearing intact, tongue/uvula/soft palate midline, normal sternocleidomastoid and trapezius muscle strength. No evidence of tongue atrophy or fibrillations Motor: Right hemiparesis with right arm and leg weaker than  the left side with vertical drift in both the right arm and right leg.  Left side normal Tone: is normal and bulk is normal Sensation- Intact to light touch bilaterally Coordination: FTN intact bilaterally, no ataxia in BLE. Gait- deferred  NIHSS-2   Labs I have reviewed labs in epic and the results pertinent to this consultation are:   CBC    Component Value Date/Time   WBC 6.9 03/11/2020 1434   RBC 4.14 03/11/2020 1434   HGB  13.3 03/11/2020 1443   HCT 39.0 03/11/2020 1443   PLT 226 03/11/2020 1434   MCV 92.5 03/11/2020 1434   MCH 31.6 03/11/2020 1434   MCHC 34.2 03/11/2020 1434   RDW 12.2 03/11/2020 1434   LYMPHSABS 2.6 03/11/2020 1434   MONOABS 0.5 03/11/2020 1434   EOSABS 0.2 03/11/2020 1434   BASOSABS 0.1 03/11/2020 1434    CMP     Component Value Date/Time   NA 140 03/11/2020 1443   K 3.4 (L) 03/11/2020 1443   CL 104 03/11/2020 1443   CO2 23 12/20/2011 0844   GLUCOSE 110 (H) 03/11/2020 1443   BUN 8 03/11/2020 1443   CREATININE 0.50 03/11/2020 1443   CALCIUM 8.5 12/20/2011 0844   PROT 7.0 12/20/2011 0844   ALBUMIN 4.0 12/20/2011 0844   AST 15 12/20/2011 0844   ALT 14 12/20/2011 0844   ALKPHOS 68 12/20/2011 0844   BILITOT 0.3 12/20/2011 0844    Imaging I have reviewed the images obtained:  CT-scan of the brain-aspects 10.  No bleed CT head and neck with no emergent LVO.   Assessment: 49 year old woman with no significant past medical history other than that of a cardiac procedure done to repair a congenital heart defect, presenting for sudden onset of right arm and leg weakness. On examination, she has right hemiparesis although without any facial involvement. Given that the symptoms are on her dominant side and are disabling, I offered to give her IV TPA with the concern that this is a clinical suspicion for an acute ischemic stroke of small vessel etiology. She agreed to IV TPA and IV TPA was given within 12 minutes of arrival, after obtaining a stat CT head which ruled out an acute process or bleed. She will be admitted to the ICU for post TPA care.  She is also a candidate for OPTIMIST-main trial and will be approached by the team to enroll.  Impression: Evaluate for acute ischemic stroke-likely small vessel etiology   Recommendations: -Admit to neuro ICU -Telemetry -Neurochecks according to post TPA protocol -Blood pressure parameters-systolic blood pressure less than  180 -MRI brain without contrast in 24 hours -A1c -Lipid panel -2D echocardiogram-might need TEE -Consider transcranial Dopplers with bubble study, and lower extremity Dopplers after the 2D echocardiogram if suspicion remains for embolic source without one being evident on 2D echo -Hypercoagulable panel as an outpatient if the MRI is positive for stroke .  She just got TPA, there is no point in drawing the panel now. -PT -OT -Speech therapy -N.p.o. until cleared by bedside swallow formal swallow evaluation -Gentle hydration with 75 cc of saline -Check labs and replete electrolytes as necessary  CODE STATUS: Full code Bedrest per TPA protocol for 24 hours  Present on admission: Right hemiparesis, acute ischemic stroke  Stroke team to continue to follow.   -- [name], MD [facility] Pager: [phone] If 7pm to 7am, please call on call as listed on AMION.   CRITICAL CARE ATTESTATION Performed by: [name],  MD Total critical care time: 55 minutes Critical care time was exclusive of separately billable procedures and treating other patients and/or supervising APPs/Residents/Students Critical care was necessary to treat or prevent imminent or life-threatening deterioration due to IV thrombolysis for acute ischemic stroke This patient is critically ill and at significant risk for neurological worsening and/or death and care requires constant monitoring. Critical care was time spent personally by me on the following activities: development of treatment plan with patient and/or surrogate as well as nursing, discussions with consultants, evaluation of patient's response to treatment, examination of patient, obtaining history from patient or surrogate, ordering and performing treatments and interventions, ordering and review of laboratory studies, ordering and review of radiographic studies, pulse oximetry, re-evaluation of patient's condition, participation in  multidisciplinary rounds and medical decision making of high complexity in the care of this patient.

## 2020-03-11 NOTE — Progress Notes (Signed)
PHARMACIST CODE STROKE RESPONSE  Notified to mix tPA at 1438 by Dr. Wilford Corner Delivered tPA to RN at 1441  tPA dose = 8.1mg  bolus over 1 minute followed by 73mg  for a total dose of 81.1mg  over 1 hour  , PharmD PGY1 Acute Care Pharmacy Resident Please refer to [facility] for unit-specific pharmacist

## 2020-03-11 NOTE — ED Provider Notes (Signed)
[facility] EMERGENCY DEPARTMENT Provider Note   CSN: [id number] Arrival date & time: 03/11/20  1429     History Chief Complaint  Patient presents with   Code Stroke    [name] is a 49 y.o. female with past medical history significant for congenital heart defect with surgery at age 31. No other known past medical history.  HPI Patient presents to emergency department today via EMS as a code stroke. Last known normal 1355.  She was met at the bridge by neurologist Dr. Rory Percy, determined to have NIH 2 with right arm and leg weakness. Immediately went to CT.  Patient states she has had a normal day until returning from lunch.  She went to lunch and had no difficulty eating.  Upon arriving back at work she attempted to get out of the car.  She noticed that her right arm was numb and she was unable to move it off the center console.  She also noticed that her right leg felt heavy and she was unable to lift it.  She is also reporting that her head felt very heavy and she was unable to hold it upright.  She called her office manager who called EMS.  She denies any recent illness.  No recent antibiotic use.  Denies fever, chills, headache, visual changes, neck pain, chest pain, abdominal pain.    Past Medical History:  Diagnosis Date   No diagnosis     Patient Active Problem List   Diagnosis Date Noted   Acute ischemic stroke ([place]) 03/11/2020   Hypothyroid 12/22/2011   Stress incontinence 12/22/2011   Family history of kidney disease 12/22/2011   Routine gynecological examination 12/22/2011   Routine general medical examination at a health care facility 12/19/2011   SHOULDER PAIN, RIGHT 10/03/2008    Past Surgical History:  Procedure Laterality Date   ENDOMETRIAL ABLATION     Uterine/heavy menses   SHOULDER SURGERY     Left,/bone spurs     OB History   No obstetric history on file.     No family history on file.  Social History    Tobacco Use   Smoking status: Former Smoker   Smokeless tobacco: Never Used  Substance Use Topics   Alcohol use: Not on file   Drug use: Not on file    Home Medications Prior to Admission medications   Medication Sig Start Date End Date Taking? Authorizing Provider  cyclobenzaprine (FLEXERIL) 10 MG tablet Take 1 tablet (10 mg total) by mouth 3 (three) times daily as needed for muscle spasms. 04/06/13   Jearld Fenton, NP  levothyroxine (SYNTHROID, LEVOTHROID) 137 MCG tablet Take 137 mcg by mouth daily.    [provider]  naproxen sodium (ANAPROX) 220 MG tablet Take 220 mg by mouth as needed.    [provider]  predniSONE (DELTASONE) 10 MG tablet Take 3 tablets on days 1-2, take 2 tablets on days 3-4, take 3 tablets on days 5-6 04/06/13   Jearld Fenton, NP    Allergies    Patient has no known allergies.  Review of Systems   Review of Systems All other systems are reviewed and are negative for acute change except as noted in the HPI.  Physical Exam Updated Vital Signs BP (!) 107/50    Pulse 78    Resp 13    Wt 90.1 kg    BMI 35.19 kg/m   Physical Exam Vitals and nursing note reviewed.  Constitutional:  General: She is not in acute distress.    Appearance: She is not ill-appearing.  HENT:     Head: Normocephalic and atraumatic.     Right Ear: Tympanic membrane and external ear normal.     Left Ear: Tympanic membrane and external ear normal.     Nose: Nose normal.     Mouth/Throat:     Mouth: Mucous membranes are moist.     Pharynx: Oropharynx is clear.  Eyes:     General: No scleral icterus.       Right eye: No discharge.        Left eye: No discharge.     Extraocular Movements: Extraocular movements intact.     Conjunctiva/sclera: Conjunctivae normal.     Pupils: Pupils are equal, round, and reactive to light.  Neck:     Vascular: No JVD.  Cardiovascular:     Rate and Rhythm: Normal rate and regular rhythm.     Pulses: Normal pulses.           Radial pulses are 2+ on the right side and 2+ on the left side.     Heart sounds: Normal heart sounds.  Pulmonary:     Comments: Lungs clear to auscultation in all fields. Symmetric chest rise. No wheezing, rales, or rhonchi. Abdominal:     Comments: Abdomen is soft, non-distended, and non-tender in all quadrants. No rigidity, no guarding. No peritoneal signs.  Musculoskeletal:     Cervical back: Normal range of motion.  Skin:    General: Skin is warm and dry.     Capillary Refill: Capillary refill takes less than 2 seconds.  Neurological:     Mental Status: She is oriented to person, place, and time.     GCS: GCS eye subscore is 4. GCS verbal subscore is 5. GCS motor subscore is 6.     Comments: Speech is clear and goal oriented, follows commands CN III-XII intact, no facial droop Normal strength in upper and lower extremities bilaterally including dorsiflexion and plantar flexion, strong and equal grip strength Sensation normal to light and sharp touch Moves extremities without ataxia, coordination intact Normal finger to nose and rapid alternating movements   Psychiatric:        Behavior: Behavior normal.     ED Results / Procedures / Treatments   Labs (all labs ordered are listed, but only abnormal results are displayed) Labs Reviewed  APTT - Abnormal; Notable for the following components:      Result Value   aPTT 23 (*)    All other components within normal limits  COMPREHENSIVE METABOLIC PANEL - Abnormal; Notable for the following components:   Potassium 3.4 (*)    CO2 21 (*)    Glucose, Bld 114 (*)    Calcium 8.8 (*)    All other components within normal limits  I-STAT CHEM 8, ED - Abnormal; Notable for the following components:   Potassium 3.4 (*)    Glucose, Bld 110 (*)    Calcium, Ion 1.08 (*)    All other components within normal limits  CBG MONITORING, ED - Abnormal; Notable for the following components:   Glucose-Capillary 112 (*)    All other  components within normal limits  SARS CORONAVIRUS 2 BY RT PCR (HOSPITAL ORDER, [place] [facility])  PROTIME-INR  CBC  DIFFERENTIAL  HIV ANTIBODY (ROUTINE TESTING W REFLEX)  I-STAT BETA HCG BLOOD, ED ([facility], [facility], [facility] ONLY)    EKG None  Radiology CT  HEAD CODE STROKE WO CONTRAST  Result Date: 03/11/2020 CLINICAL DATA:  Code stroke.  Right-sided weakness EXAM: CT HEAD WITHOUT CONTRAST TECHNIQUE: Contiguous axial images were obtained from the base of the skull through the vertex without intravenous contrast. COMPARISON:  None. FINDINGS: Brain: No acute intracranial hemorrhage, mass effect, or edema. Question of small area of loss of gray-white differentiation in the left parietal lobe (series 3, images 21 and 22). Gray-white differentiation is otherwise preserved. Ventricles and sulci are normal in size and configuration. There is no extra-axial fluid collection. Vascular: No hyperdense vessel. Skull: Unremarkable. Sinuses/Orbits: Left maxillary sinus retention cyst. Left frontal sinus mucosal thickening with occlusion of outflow. Orbits are unremarkable. Other: Mastoid air cells are clear. ASPECTS ([place] Stroke Program Early CT Score) - Ganglionic level infarction (caudate, lentiform nuclei, internal capsule, insula, M1-M3 cortex): 7 - Supraganglionic infarction (M4-M6 cortex): 2-3 Total score (0-10 with 10 being normal): 9-10 IMPRESSION: No acute intracranial hemorrhage or definite evidence of acute infarction. Questionable small area of loss of gray-white differentiation in the left parietal lobe. ASPECT score is 9-10. Initial results were communicated to Dr. Rory Percy at 2:44 pmon 9/7/2021by text page via the [facility] messaging system. Electronically Signed   By: Macy Mis M.D.   On: 03/11/2020 14:48   CT ANGIO HEAD CODE STROKE  Result Date: 03/11/2020 CLINICAL DATA:  Right-sided weakness EXAM: CT ANGIOGRAPHY HEAD AND NECK TECHNIQUE: Multidetector CT imaging of the head and neck was  performed using the standard protocol during bolus administration of intravenous contrast. Multiplanar CT image reconstructions and MIPs were obtained to evaluate the vascular anatomy. Carotid stenosis measurements (when applicable) are obtained utilizing NASCET criteria, using the distal internal carotid diameter as the denominator. CONTRAST:  24m OMNIPAQUE IOHEXOL 350 MG/ML SOLN COMPARISON:  None. FINDINGS: Motion artifact is present but study is diagnostic. CTA NECK Aortic arch: Great vessel origins are patent. Right carotid system: Patent. No measurable stenosis or evidence of dissection. Left carotid system: Patent. No measurable stenosis or evidence of dissection. Vertebral arteries: Patent. Right vertebral artery is dominant with small caliber left vertebral artery. No measurable stenosis or evidence of dissection. Skeleton: Cervical spine degenerative changes primarily at C5-C6. Other neck: No mass or adenopathy. Upper chest: No apical lung mass. Review of the MIP images confirms the above findings CTA HEAD Anterior circulation: Intracranial internal carotid arteries patent. Anterior and middle cerebral arteries are patent. Posterior circulation: Intracranial vertebral arteries patent. Left vertebral artery terminates as a PICA. Basilar artery is patent. Posterior cerebral arteries are. Venous sinuses: Patent as allowed by contrast bolus timing. Review of the MIP images confirms the above findings IMPRESSION: No large vessel occlusion or hemodynamically significant stenosis. Initial results were discussed by telephone at the time of interpretation on 03/11/2020 at 2:55 pm with provider ASHISH ARory Percy, who verbally acknowledged these results. Electronically Signed   By: PMacy MisM.D.   On: 03/11/2020 15:05   CT ANGIO NECK CODE STROKE  Result Date: 03/11/2020 CLINICAL DATA:  Right-sided weakness EXAM: CT ANGIOGRAPHY HEAD AND NECK TECHNIQUE: Multidetector CT imaging of the head and neck was performed  using the standard protocol during bolus administration of intravenous contrast. Multiplanar CT image reconstructions and MIPs were obtained to evaluate the vascular anatomy. Carotid stenosis measurements (when applicable) are obtained utilizing NASCET criteria, using the distal internal carotid diameter as the denominator. CONTRAST:  776mOMNIPAQUE IOHEXOL 350 MG/ML SOLN COMPARISON:  None. FINDINGS: Motion artifact is present but study is diagnostic. CTA NECK Aortic arch: Great vessel  origins are patent. Right carotid system: Patent. No measurable stenosis or evidence of dissection. Left carotid system: Patent. No measurable stenosis or evidence of dissection. Vertebral arteries: Patent. Right vertebral artery is dominant with small caliber left vertebral artery. No measurable stenosis or evidence of dissection. Skeleton: Cervical spine degenerative changes primarily at C5-C6. Other neck: No mass or adenopathy. Upper chest: No apical lung mass. Review of the MIP images confirms the above findings CTA HEAD Anterior circulation: Intracranial internal carotid arteries patent. Anterior and middle cerebral arteries are patent. Posterior circulation: Intracranial vertebral arteries patent. Left vertebral artery terminates as a PICA. Basilar artery is patent. Posterior cerebral arteries are. Venous sinuses: Patent as allowed by contrast bolus timing. Review of the MIP images confirms the above findings IMPRESSION: No large vessel occlusion or hemodynamically significant stenosis. Initial results were discussed by telephone at the time of interpretation on 03/11/2020 at 2:55 pm with provider ASHISH Rory Percy , who verbally acknowledged these results. Electronically Signed   By: Macy Mis M.D.   On: 03/11/2020 15:05    Procedures .Critical Care Performed by: Cherre Robins, PA-C Authorized by: Cherre Robins, PA-C   Critical care provider statement:    Critical care time (minutes):  30   Critical care  time was exclusive of:  Separately billable procedures and treating other patients and teaching time   Critical care was necessary to treat or prevent imminent or life-threatening deterioration of the following conditions:  CNS failure or compromise   Critical care was time spent personally by me on the following activities:  Development of treatment plan with patient or surrogate, discussions with consultants, evaluation of patient's response to treatment, examination of patient, ordering and performing treatments and interventions, ordering and review of laboratory studies, pulse oximetry, ordering and review of radiographic studies, re-evaluation of patient's condition, review of old charts and obtaining history from patient or surrogate   (including critical care time)  Medications Ordered in ED Medications  sodium chloride flush (NS) 0.9 % injection 3 mL (has no administration in time range)  alteplase (ACTIVASE) 1 mg/mL infusion 81.1 mg (has no administration in time range)    Followed by  0.9 %  sodium chloride infusion (has no administration in time range)  iohexol (OMNIPAQUE) 350 MG/ML injection 75 mL (has no administration in time range)   stroke: mapping our early stages of recovery book (has no administration in time range)  0.9 %  sodium chloride infusion (has no administration in time range)  acetaminophen (TYLENOL) tablet 650 mg (has no administration in time range)    Or  acetaminophen (TYLENOL) 160 MG/5ML solution 650 mg (has no administration in time range)    Or  acetaminophen (TYLENOL) suppository 650 mg (has no administration in time range)  senna-docusate (Senokot-S) tablet 1 tablet (has no administration in time range)  pantoprazole (PROTONIX) injection 40 mg (has no administration in time range)  labetalol (NORMODYNE) injection 20 mg (has no administration in time range)    And  clevidipine (CLEVIPREX) infusion 0.5 mg/mL (has no administration in time range)  iohexol  (OMNIPAQUE) 350 MG/ML injection 75 mL (75 mLs Intravenous Contrast Given 03/11/20 1445)    ED Course  I have reviewed the triage vital signs and the nursing notes.  Pertinent labs & imaging results that were available during my care of the patient were reviewed by me and considered in my medical decision making (see chart for details).  Clinical Course as of Mar 12 1539  Tue Mar 11, 2020  1539 Temperature checked and is 98.7. oxygen saturation 100 % on room air.   [KA]    Clinical Course User Index [KA] Kori Colin, Harley Hallmark, PA-C   MDM Rules/Calculators/A&P                          History provided by patient with additional history obtained from chart review.    49 yo female presenting as code stroke with R arm and leg weakness. Met at the bridge by neurology. I first interviewed and examined patient upon arrival to ED arrival after imaging. She is at baseline. Neuro exam is normal. Glucose on ED arrival 112. Labs overall unremarkable. Covid test is pending.  CT head negative for stroke, no signs of head bleed. Radiologist does comment on questionable small area of loss of gray-white differentiation in the left parietal lobe. CTA head and neck negative for acute findings. She was given TPA by neurology as she clinically has acute ischemic stroke. Neurology to admit to ICU.   Portions of this note were generated with [employer]. Dictation errors may occur despite best attempts at proofreading.   Final Clinical Impression(s) / ED Diagnoses Final diagnoses:  None    Rx / DC Orders ED Discharge Orders    None       Flint Melter 03/11/20 1540    Carmin Muskrat, MD 03/12/20 [phone]

## 2020-03-12 ENCOUNTER — Inpatient Hospital Stay (HOSPITAL_COMMUNITY): Payer: BC Managed Care – PPO

## 2020-03-12 DIAGNOSIS — E7800 Pure hypercholesterolemia, unspecified: Secondary | ICD-10-CM

## 2020-03-12 DIAGNOSIS — I361 Nonrheumatic tricuspid (valve) insufficiency: Secondary | ICD-10-CM

## 2020-03-12 DIAGNOSIS — Z9282 Status post administration of tPA (rtPA) in a different facility within the last 24 hours prior to admission to current facility: Secondary | ICD-10-CM

## 2020-03-12 LAB — ECHOCARDIOGRAM COMPLETE
Area-P 1/2: 3.6 cm2
S' Lateral: 2.6 cm
Weight: 3178.15 oz

## 2020-03-12 LAB — RAPID URINE DRUG SCREEN, HOSP PERFORMED
Amphetamines: NOT DETECTED
Barbiturates: NOT DETECTED
Benzodiazepines: NOT DETECTED
Cocaine: NOT DETECTED
Opiates: NOT DETECTED
Tetrahydrocannabinol: NOT DETECTED

## 2020-03-12 LAB — LIPID PANEL
Cholesterol: 172 mg/dL (ref 0–200)
HDL: 48 mg/dL (ref >40)
LDL Cholesterol: 94 mg/dL (ref 0–99)
Total CHOL/HDL Ratio: 3.6 RATIO
Triglycerides: 150 mg/dL — ABNORMAL HIGH (ref <150)
VLDL: 30 mg/dL (ref 0–40)

## 2020-03-12 LAB — VITAMIN B12: Vitamin B-12: 268 pg/mL (ref 180–914)

## 2020-03-12 LAB — TSH: TSH: 6.13 u[IU]/mL — ABNORMAL HIGH (ref 0.350–4.500)

## 2020-03-12 LAB — HEMOGLOBIN A1C
Hgb A1c MFr Bld: 5.3 % (ref 4.8–5.6)
Mean Plasma Glucose: 105.41 mg/dL

## 2020-03-12 LAB — T4, FREE: Free T4: 0.69 ng/dL (ref 0.61–1.12)

## 2020-03-12 LAB — RENAL FUNCTION PANEL
Albumin: 3.4 g/dL — ABNORMAL LOW (ref 3.5–5.0)
Anion gap: 10 (ref 5–15)
BUN: 7 mg/dL (ref 6–20)
CO2: 24 mmol/L (ref 22–32)
Calcium: 8.6 mg/dL — ABNORMAL LOW (ref 8.9–10.3)
Chloride: 106 mmol/L (ref 98–111)
Creatinine, Ser: 0.64 mg/dL (ref 0.44–1.00)
GFR calc Af Amer: >60 mL/min (ref >60)
GFR calc non Af Amer: >60 mL/min (ref >60)
Glucose, Bld: 102 mg/dL — ABNORMAL HIGH (ref 70–99)
Phosphorus: 3.4 mg/dL (ref 2.5–4.6)
Potassium: 3.9 mmol/L (ref 3.5–5.1)
Sodium: 140 mmol/L (ref 135–145)

## 2020-03-12 LAB — MRSA PCR SCREENING: MRSA by PCR: NEGATIVE

## 2020-03-12 LAB — HIV ANTIBODY (ROUTINE TESTING W REFLEX): HIV Screen 4th Generation wRfx: NONREACTIVE

## 2020-03-12 IMAGING — MR MR HEAD W/O CM
7 of 11 series · 25 of 48 positions shown · non-contrast
Comparison: None.

CLINICAL DATA: Right-sided weakness

EXAM:
MRI HEAD WITHOUT CONTRAST
TECHNIQUE: Multiplanar, multiecho pulse sequences of the brain and surrounding
structures were obtained without intravenous contrast.

[Series 2: DWI · axial · 3.0mm · 0.94mm/px · z∈[-72,+74]mm · 7 of 100 slices shown (1 of 2)]
[im 1/100]
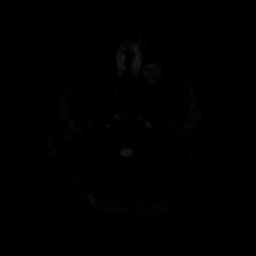
[im 17/100]
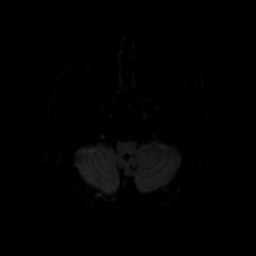
[im 34/100]
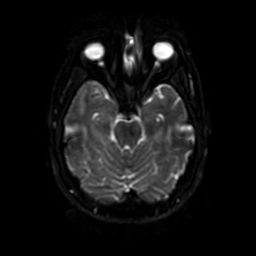
[im 50/100]
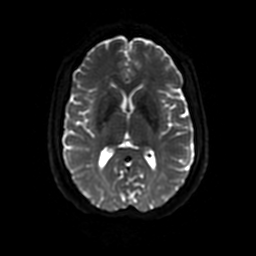
[im 67/100]
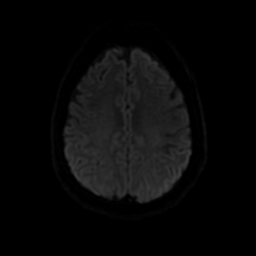
[im 83/100]
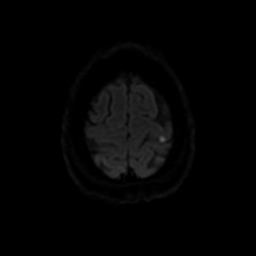
[im 100/100]
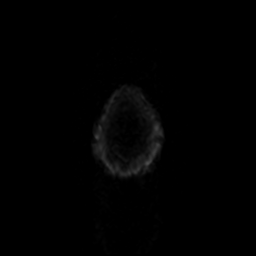

[Series 3: DWI · coronal · 4.0mm · 0.94mm/px · 6 of 74 slices shown (2 of 2)]
[im 1/74]
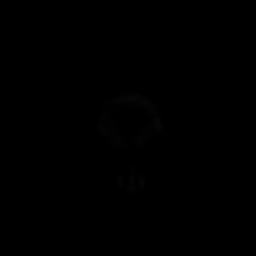
[im 15/74]
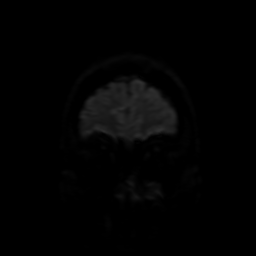
[im 30/74]
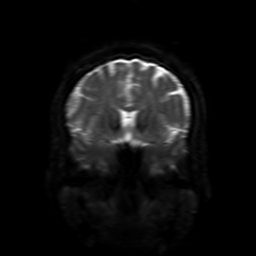
[im 44/74]
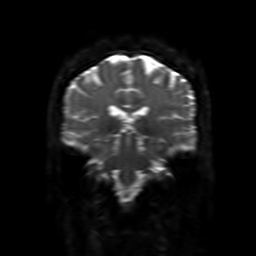
[im 59/74]
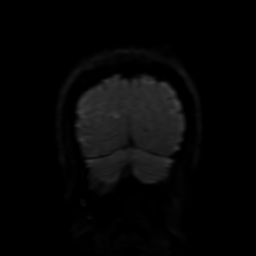
[im 74/74]
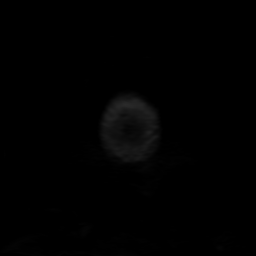

[Series 4: FLAIR · sagittal · 5.0mm · 0.23mm/px · 2 of 23 slices shown (1 of 2)]
[im 1/23]
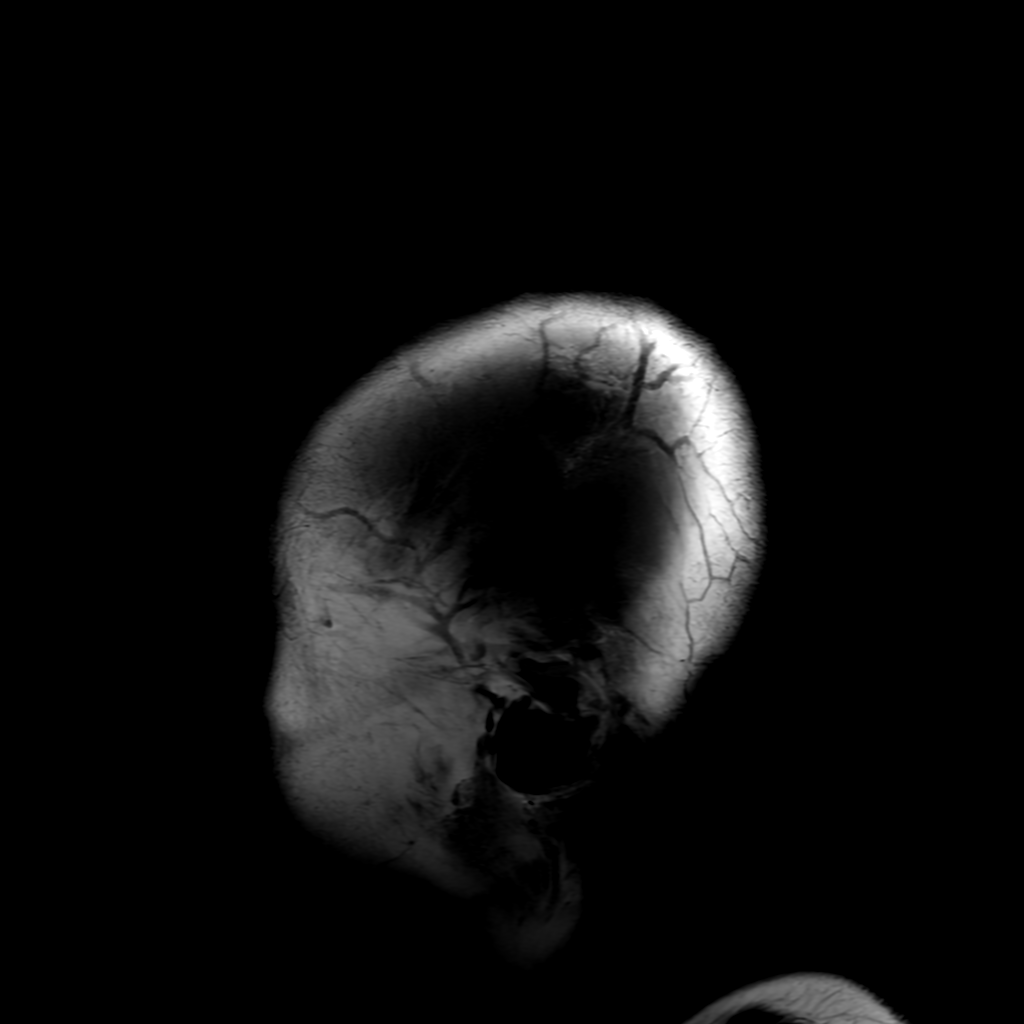
[im 23/23]
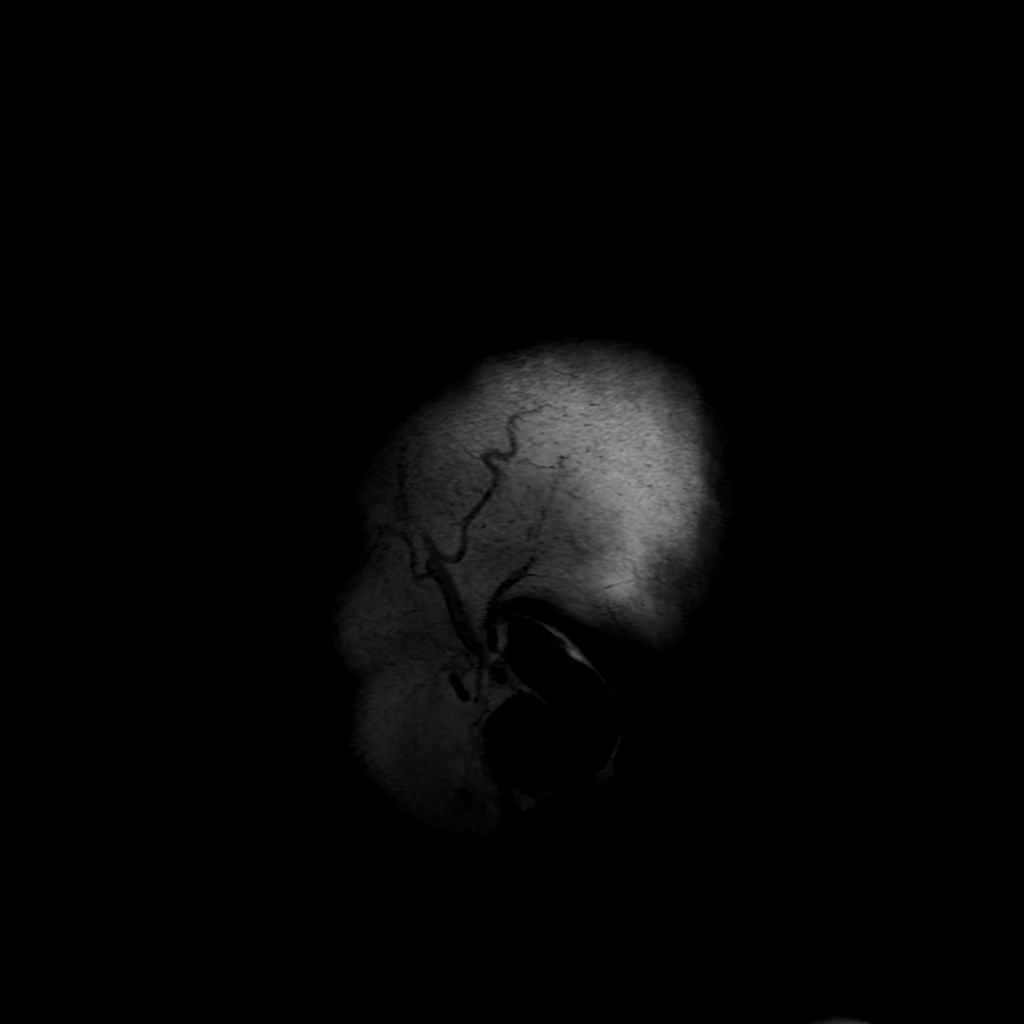

[Series 5: T2 · axial · 5.0mm · 0.23mm/px · 1 of 25 slices shown]
[im 1/25]
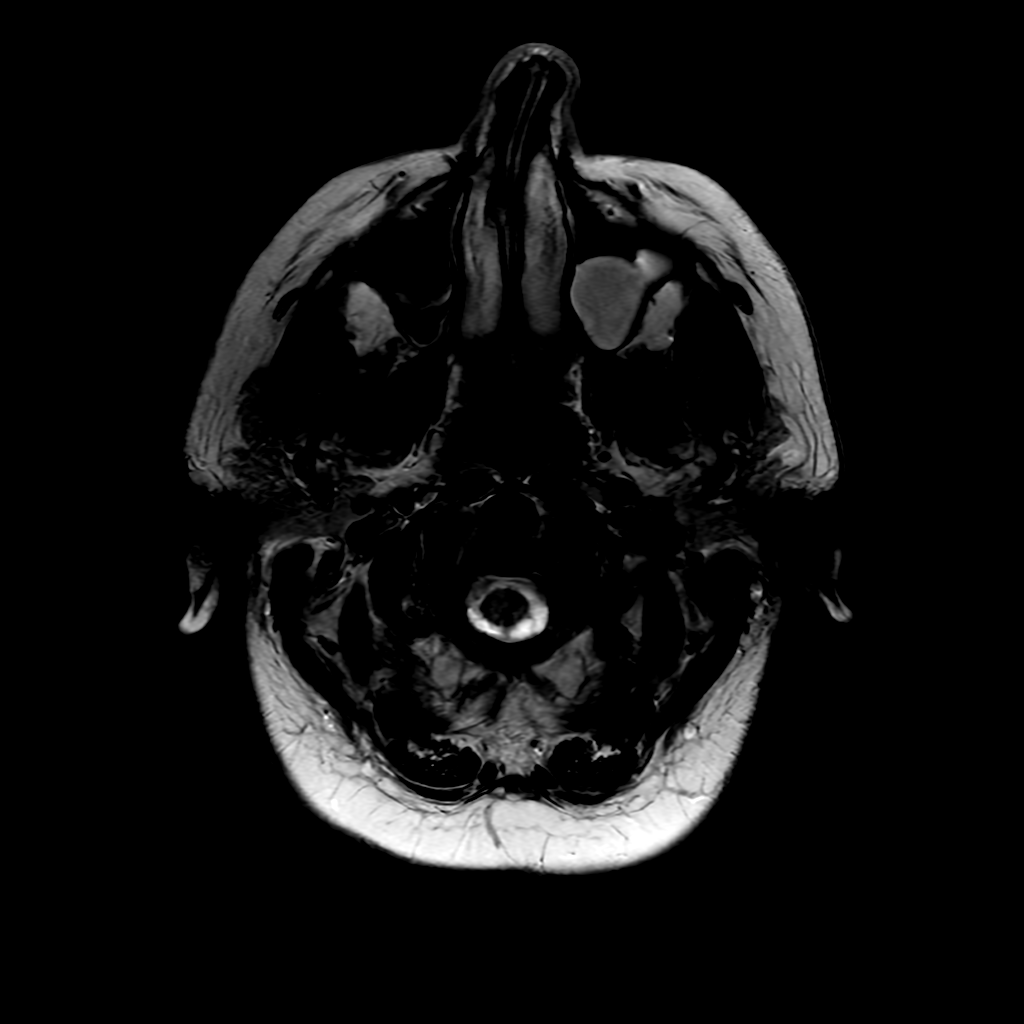

[Series 6: FLAIR · axial · 3.0mm · 0.41mm/px · z∈[-71,+73]mm · 2 of 25 slices shown (2 of 2)]
[im 1/25]
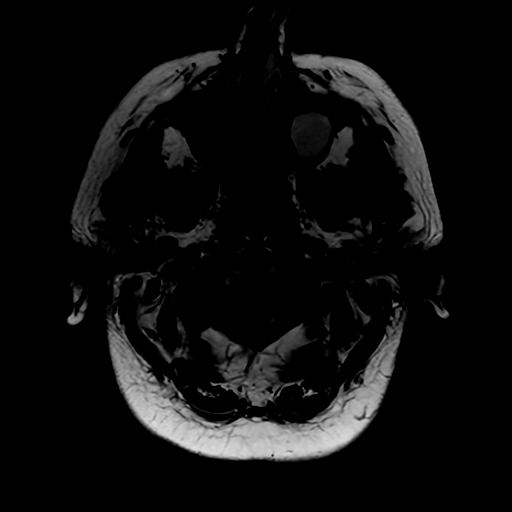
[im 25/25]
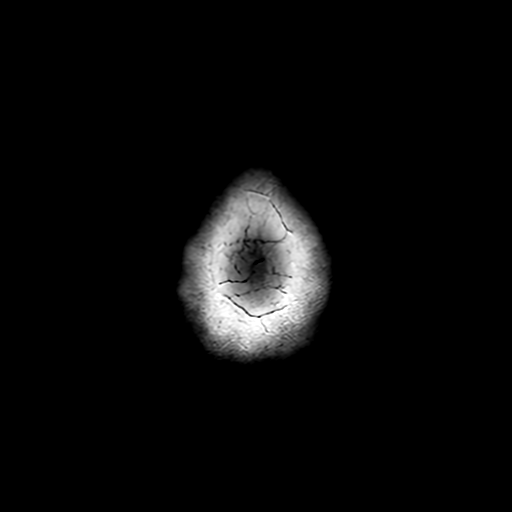

[Series 250: ADC · axial · 3.0mm · 0.94mm/px · z∈[-72,+74]mm · 4 of 50 slices shown (1 of 2)]
[im 1/50]
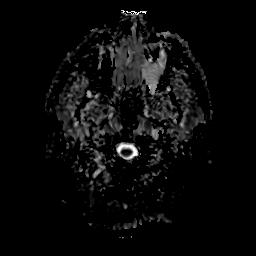
[im 17/50]
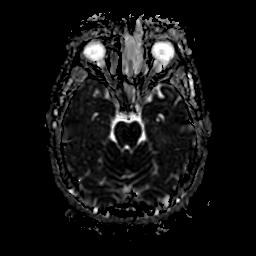
[im 33/50]
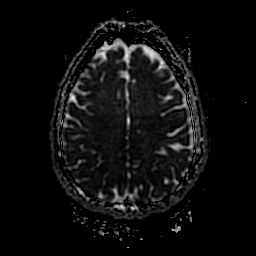
[im 50/50]
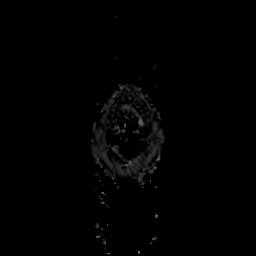

[Series 350: ADC · coronal · 4.0mm · 0.94mm/px · 3 of 37 slices shown (2 of 2)]
[im 1/37]
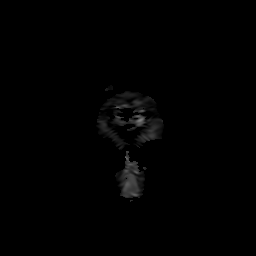
[im 19/37]
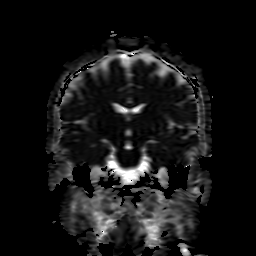
[im 37/37]
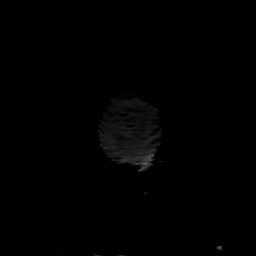

[25 of 48 positions shown; findings below may reference images not displayed]

FINDINGS: Brain: Two small foci of mildly reduced diffusion are present in the
right parieto-occipital region. Additional two small foci in the
left frontal lobe involving the precentral gyrus just medial to the
hand motor region.

There is no intracranial hemorrhage. There is no intracranial mass
or significant mass effect. There is no hydrocephalus or extra-axial
fluid collection. Ventricles and sulci are normal in size and
configuration. Patchy foci of T2 hyperintensity in the
supratentorial white matter are nonspecific but may reflect mild
chronic microvascular ischemic changes.

Vascular: Major vessel flow voids at the skull base are preserved.

Skull and upper cervical spine: Normal marrow signal is preserved.

Sinuses/Orbits: Mild mucosal thickening with a left maxillary sinus
retention cyst. Orbits are unremarkable.

Other: Sella is unremarkable.  Mastoid air cells are clear.
IMPRESSION: Small late acute infarcts in the right parieto-occipital region and
left frontal lobe. The latter accounts for reported right-sided
weakness.

Mild chronic microvascular ischemic changes.

## 2020-03-12 IMAGING — MR MR CERVICAL SPINE W/O CM
4 of 5 series · 19 of 48 positions shown · non-contrast
Comparison: None.

CLINICAL DATA: Acute onset right arm and leg weakness [DATE].
No known injury.

EXAM:
MRI CERVICAL SPINE WITHOUT CONTRAST
TECHNIQUE: Multiplanar, multisequence MR imaging of the cervical spine was
performed. No intravenous contrast was administered.

[Series 3: T2 · sagittal · 3.0mm · 0.43mm/px · 4 of 16 slices shown (1 of 2)]
[im 1/16]
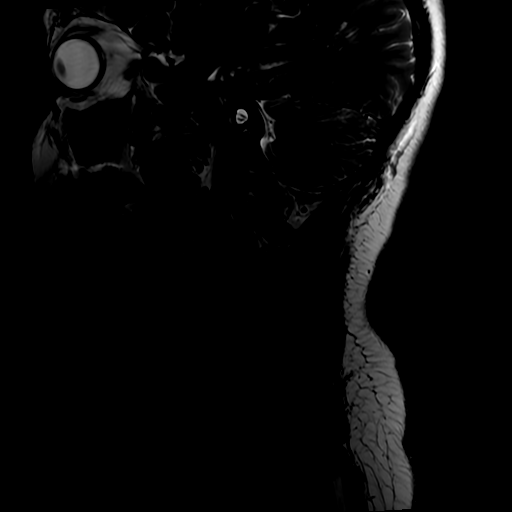
[im 6/16]
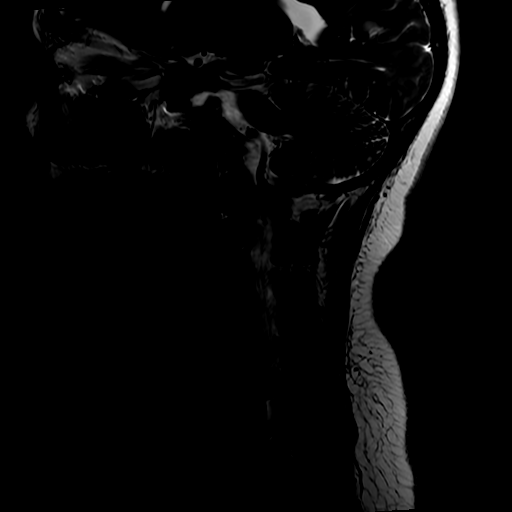
[im 11/16]
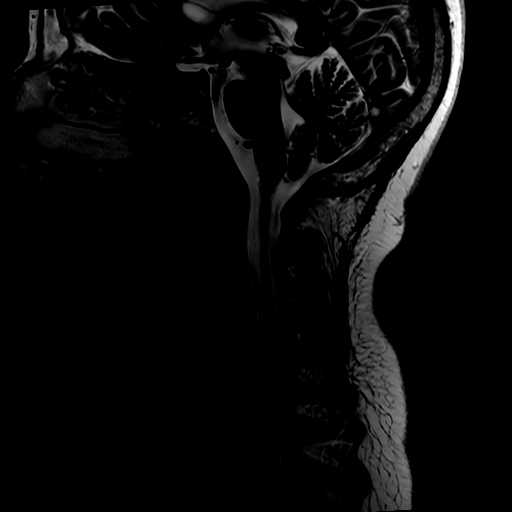
[im 16/16]
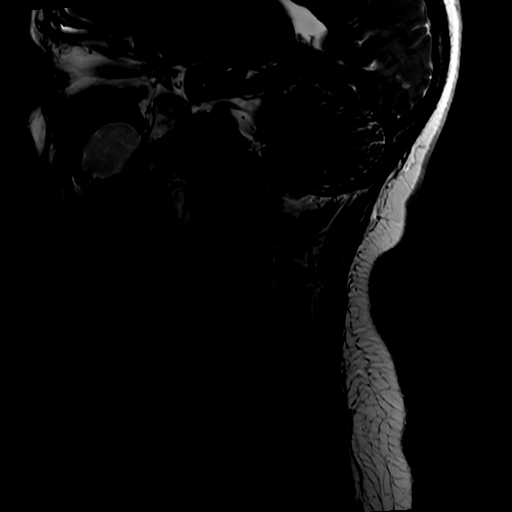

[Series 4: FLAIR · sagittal · 3.0mm · 0.43mm/px · 3 of 16 slices shown]
[im 1/16]
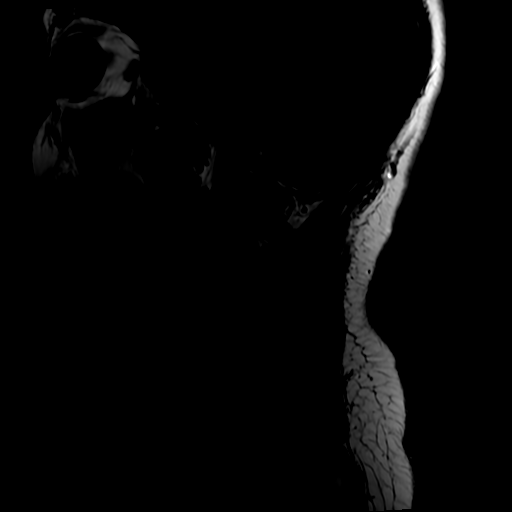
[im 8/16]
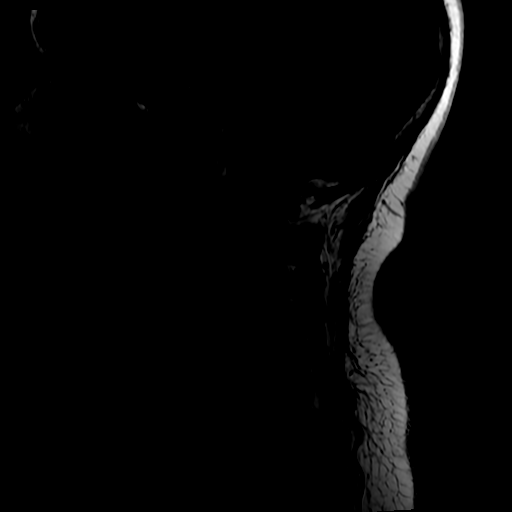
[im 16/16]
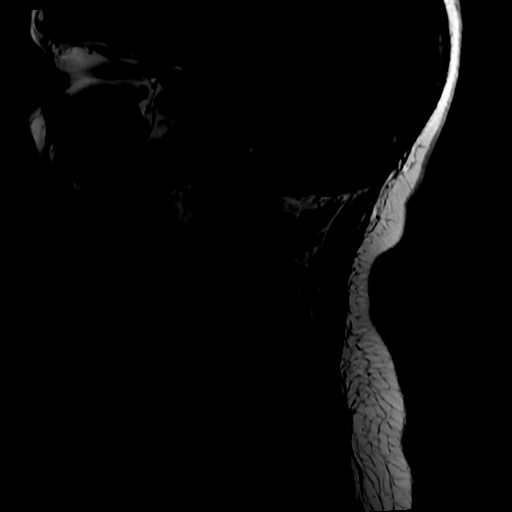

[Series 5: STIR · sagittal · 3.0mm · 0.43mm/px · 3 of 16 slices shown]
[im 1/16]
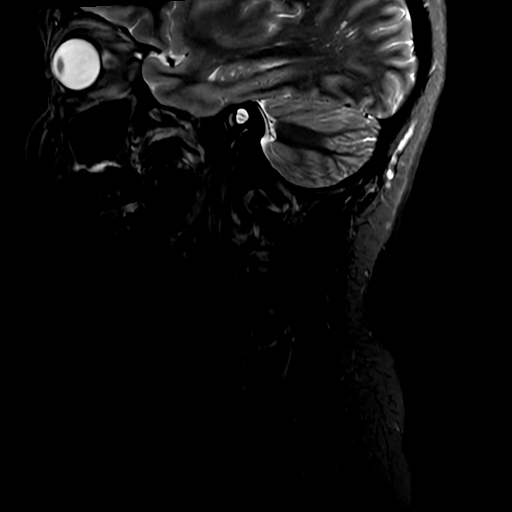
[im 8/16]
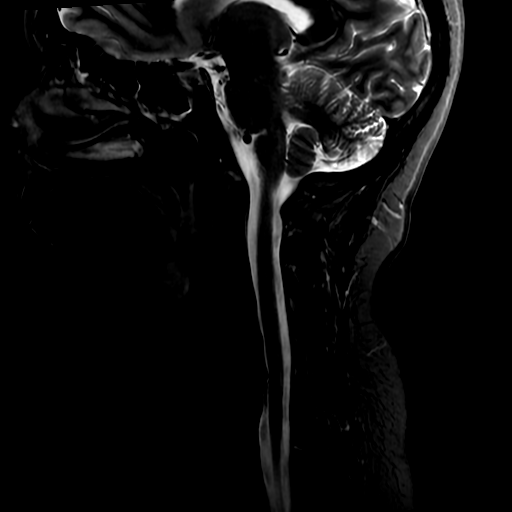
[im 16/16]
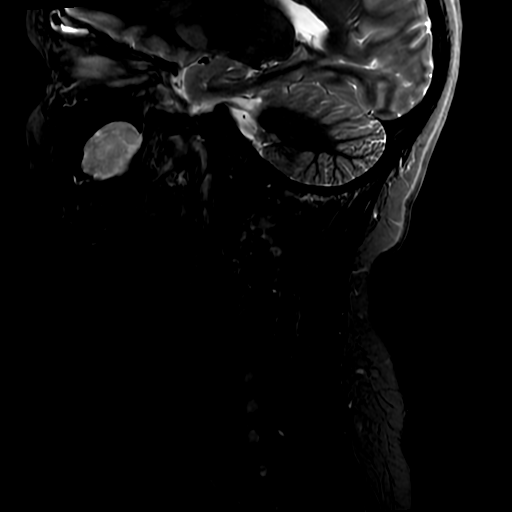

[Series 7: T2 · axial · 3.0mm · 0.35mm/px · z∈[-209,-120]mm · 9 of 29 slices shown (2 of 2)]
[im 1/29]
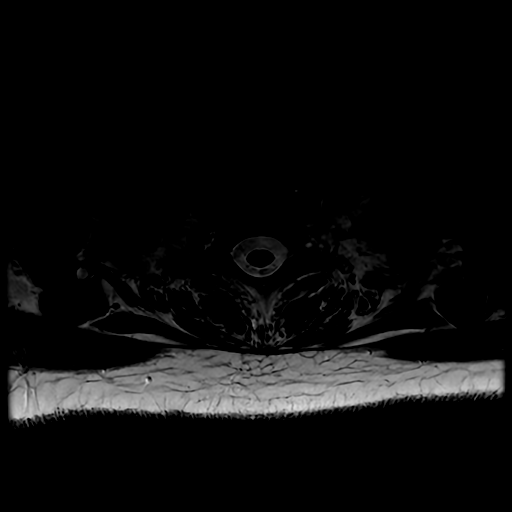
[im 4/29]
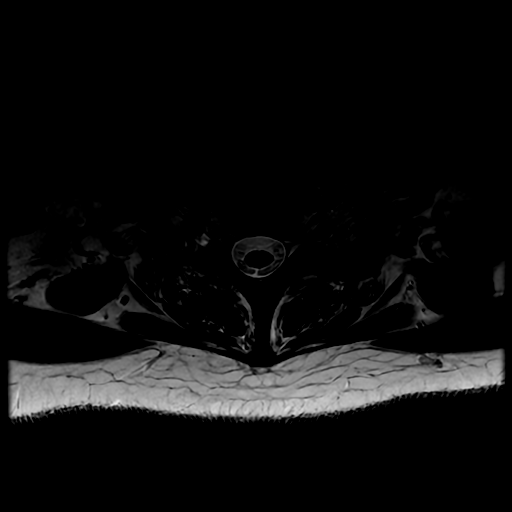
[im 8/29]
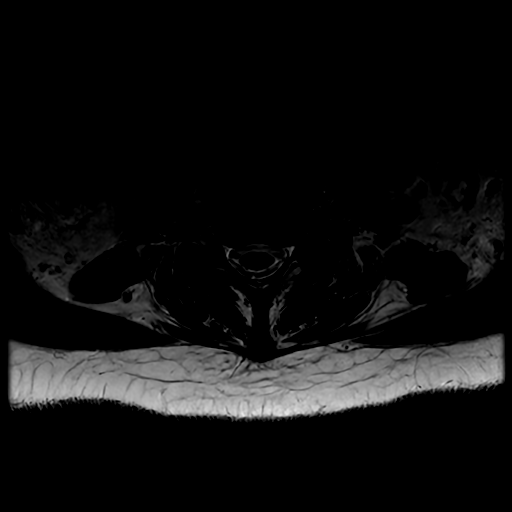
[im 11/29]
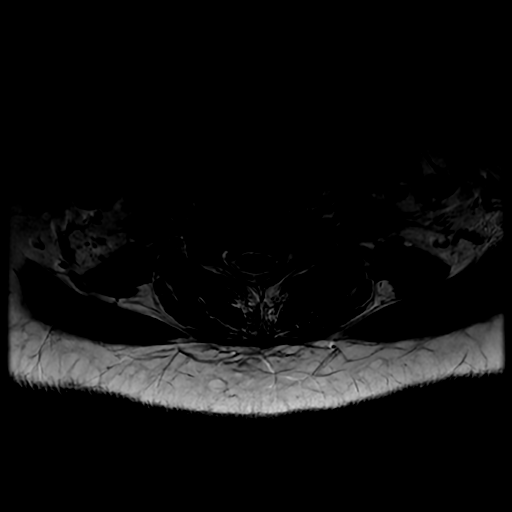
[im 15/29]
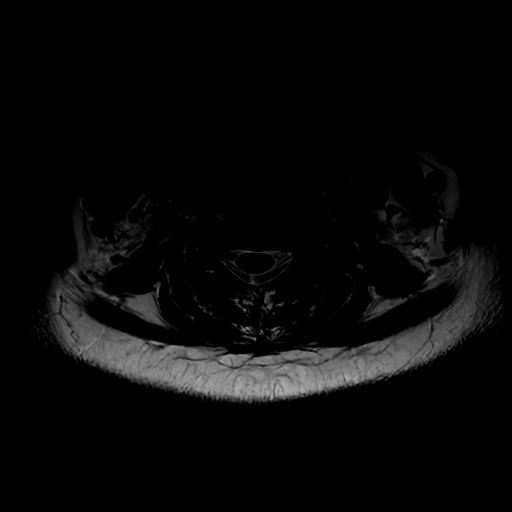
[im 18/29]
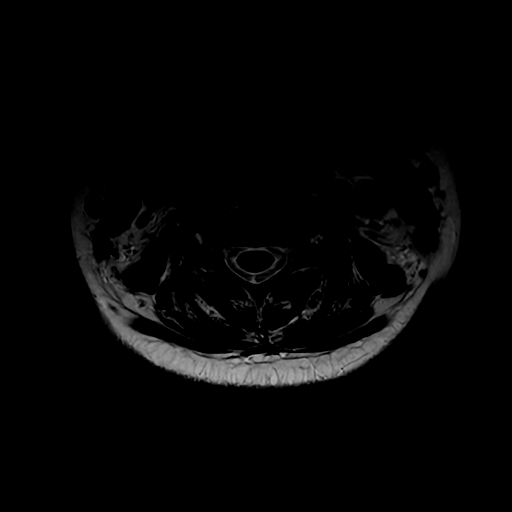
[im 22/29]
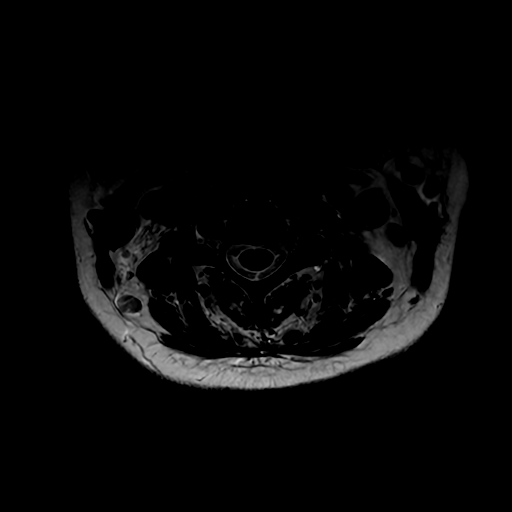
[im 25/29]
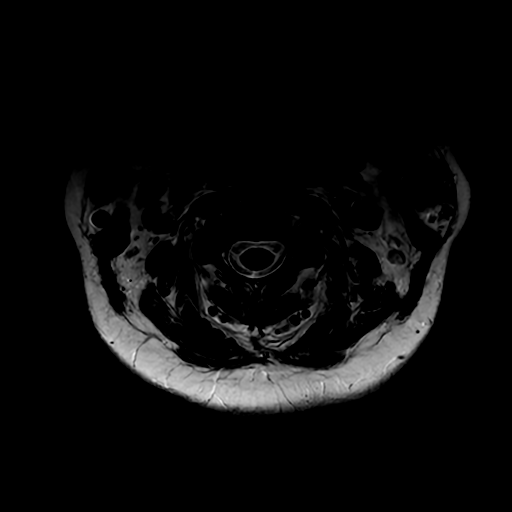
[im 29/29]
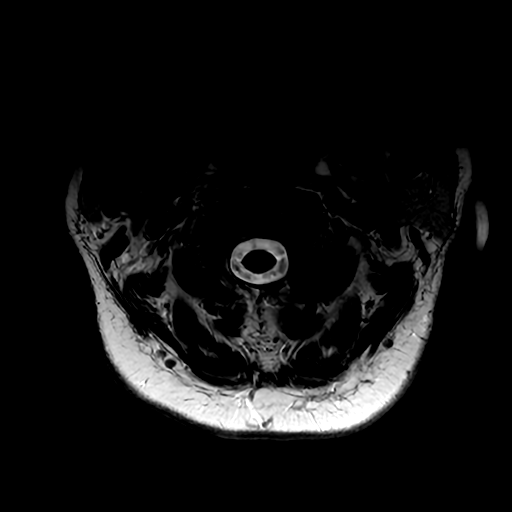

[19 of 48 positions shown; findings below may reference images not displayed]

FINDINGS: Alignment: There is mild kyphosis centered about the C5-6 level.

Vertebrae: No fracture, evidence of discitis, or bone lesion.

Cord: Normal signal throughout.

Posterior Fossa, vertebral arteries, paraspinal tissues: Mucous
retention cyst or polyp in the left maxillary sinus is partially
imaged.

Disc levels:

C2-3: Negative.

C3-4: Minimal central protrusion. The central canal and neural
foramina are widely patent.

C4-5: Very shallow disc bulge. The central canal and foramina are
widely patent.

C5-6: The patient has a shallow broad-based disc bulge and left
worse than right uncovertebral disease. The ventral thecal sac is
effaced. Mild to moderate foraminal narrowing is worse on the left.

C6-7: Shallow disc bulge and mild uncovertebral disease without
stenosis.

C7-T1: Negative.
IMPRESSION: No finding to explain the patient's symptoms. The cervical cord is
normal in appearance.

Spondylosis is most notable at C5-6 where a shallow disc bulge
effaces the ventral thecal sac and uncovertebral disease causes mild
to moderate foraminal narrowing, worse on the left.

Mild kyphosis centered about the C5-6 level.

Mucous retention cyst or polyp left maxillary sinus.

## 2020-03-12 MED ORDER — PANTOPRAZOLE SODIUM 40 MG PO TBEC: 40.0000 mg | DELAYED_RELEASE_TABLET | Freq: Every day | ORAL | Status: DC

## 2020-03-12 MED ORDER — ASPIRIN EC 325 MG PO TBEC
325.0000 mg | DELAYED_RELEASE_TABLET | Freq: Every day | ORAL | Status: DC
  Administered 2020-03-12: 325 mg via ORAL
  Filled 2020-03-12: qty 1

## 2020-03-12 MED ORDER — ATORVASTATIN CALCIUM 10 MG PO TABS
20.0000 mg | ORAL_TABLET | Freq: Every day | ORAL | Status: DC
  Administered 2020-03-12 – 2020-03-14 (×3): 20 mg via ORAL
  Filled 2020-03-12 (×3): qty 2

## 2020-03-12 MED ORDER — CHLORHEXIDINE GLUCONATE CLOTH 2 % EX PADS
6.0000 | MEDICATED_PAD | Freq: Every day | CUTANEOUS | Status: DC
  Administered 2020-03-14: 6 via TOPICAL

## 2020-03-12 MED ORDER — POTASSIUM CHLORIDE CRYS ER 20 MEQ PO TBCR
40.0000 meq | EXTENDED_RELEASE_TABLET | Freq: Once | ORAL | Status: AC
  Administered 2020-03-12: 40 meq via ORAL
  Filled 2020-03-12: qty 2

## 2020-03-12 NOTE — Evaluation (Signed)
Speech Language Pathology Evaluation Patient Details Name: [name] MRN: [id number] DOB: 24-Oct-1970 Today's Date: 03/12/2020 Time: 1112-1129 SLP Time Calculation (min) (ACUTE ONLY): 17 min  Problem List:  Patient Active Problem List   Diagnosis Date Noted  . Acute ischemic stroke (HCC) 03/11/2020  . Hypothyroid 12/22/2011  . Stress incontinence 12/22/2011  . Family history of kidney disease 12/22/2011  . Routine gynecological examination 12/22/2011  . Routine general medical examination at a health care facility 12/19/2011  . SHOULDER PAIN, RIGHT 10/03/2008   Past Medical History:  Past Medical History:  Diagnosis Date  . No diagnosis    Past Surgical History:  Past Surgical History:  Procedure Laterality Date  . ENDOMETRIAL ABLATION     Uterine/heavy menses  . SHOULDER SURGERY     Left,/bone spurs   HPI:  Pt is a 49 y.o. female presenting  with sudden onset of right-sided weakness on 03/11/2020 and received IV tPA. CT head negative; MRI pending. 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 However, 3 out of the 5 points lost were related to her use of numbers (subtraction and repeating in a reverse order), and during testing pt reported that she has always had difficulty with math and calculations. She does not believe that she is experiencing any acute changes to her mentation. Her husband also believes that she is at her baseline, so no further SLP f/u is indicated at this time, although encouraged use of precaution upon initial return home.   SLP Assessment  SLP Recommendation/Assessment: Patient does not need any further Speech Lanaguage Pathology Services SLP Visit Diagnosis: Cognitive communication deficit (R41.841)    Follow Up Recommendations  None;Other (comment) (intermittent  supervision upon initial return home)    Frequency and Duration           SLP Evaluation Cognition  Overall Cognitive Status: Within Functional Limits for tasks assessed       Comprehension  Auditory Comprehension Overall Auditory Comprehension: Appears within functional limits for tasks assessed    Expression Expression Primary Mode of Expression: Verbal Verbal Expression Overall Verbal Expression: Appears within functional limits for tasks assessed   Oral / Motor  Motor Speech Overall Motor Speech: Appears within functional limits for tasks assessed   GO                    Mahala Menghini., M.A. CCC-SLP Acute Rehabilitation Services Pager [phone] Office [phone]  03/12/2020, 11:47 AM

## 2020-03-12 NOTE — Research (Signed)
Discussed with patient, [name], and her husband, Reeanna Acri, participating in [place] study. Information sheet provided. They are both aware she will be asked to answer questions at the time of discharge and in 90 days. Also aware she can opt out at any time. [name] agrees to participate at this time.

## 2020-03-12 NOTE — Progress Notes (Signed)
OT Cancellation Note  Patient Details Name: [name] MRN: [id number] DOB: 15-Jan-1971   Cancelled Treatment:    Reason Eval/Treat Not Completed: Active bedrest order  [facility] Glenora Morocho, OT/L   Acute OT Clinical Specialist Acute Rehabilitation Services Pager [phone] Office [phone]  03/12/2020, 6:41 AM

## 2020-03-12 NOTE — Progress Notes (Addendum)
    [facility] has been requested to perform a transesophageal echocardiogram on this patient for stroke.  After careful review of history and examination, the risks and benefits of transesophageal echocardiogram have been explained including risks of esophageal damage, perforation (1:10,000 risk), bleeding, pharyngeal hematoma as well as other potential complications associated with anesthesia including aspiration, arrhythmia, respiratory failure and death. Alternatives to treatment were discussed, questions were answered. Patient is willing to proceed. This is scheduled for 2pm with Dr. Mayford Knife tomorrow. Also notified primary team of K 3.4 on last BMET 9/7, no prior repletion ordered - since we are otherwise not involved in her care Dr. Roda Shutters will address.  Laurann Montana, PA-C 03/12/2020 3:27 PM

## 2020-03-12 NOTE — Evaluation (Signed)
Physical Therapy Evaluation Patient Details Name: [name] MRN: [id number] DOB: [birth year] Today's Date: 03/12/2020   History of Present Illness  49 y.o. 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 CT head negative, pt received IV tPA.  Clinical Impression  Pt presents to PT at or near her functional baseline. Pt is able to complete all mobility independently at this time and demonstrates no significant gait/balance deviations with multiple dynamic gait tasks. Pt reports no current functional deficits and denies concerns for mobility tasks at this time. Pt requires no further acute PT POC and is encouraged to ambulate out of the room at least 3 times per day for the remainder of her hospitalization. No PT or DME needs at discharge, acute PT signing off.    Follow Up Recommendations No PT follow up    Equipment Recommendations  None recommended by PT    Recommendations for Other Services       Precautions / Restrictions Precautions Precautions: None Restrictions Weight Bearing Restrictions: No      Mobility  Bed Mobility Overal bed mobility: Independent                Transfers Overall transfer level: Independent                  Ambulation/Gait Ambulation/Gait assistance: Independent Gait Distance (Feet): 400 Feet Assistive device: None Gait Pattern/deviations: WFL(Within Functional Limits) Gait velocity: functional Gait velocity interpretation: >2.62 ft/sec, indicative of community ambulatory General Gait Details: pt is able to change gait speed, perform head turns, stop abruptly, step over objects, and walk backward without noticeable gait or balance deviation  Stairs Stairs: Yes Stairs assistance: Modified independent (Device/Increase time) Stair Management: One rail  Left;Alternating pattern;No rails Number of Stairs: 12    Wheelchair Mobility    Modified Rankin (Stroke Patients Only) Modified Rankin (Stroke Patients Only) Pre-Morbid Rankin Score: No symptoms Modified Rankin: No symptoms     Balance Overall balance assessment: Independent                                           Pertinent Vitals/Pain Pain Assessment: Faces Faces Pain Scale: Hurts a little bit Pain Location: hips and back Pain Descriptors / Indicators: Aching Pain Intervention(s): Monitored during session    Home Living Family/patient expects to be discharged to:: Private residence Living Arrangements: Spouse/significant other;Children Available Help at Discharge: Family;Available 24 hours/day Type of Home: House Home Access: Stairs to enter Entrance Stairs-Rails: None Entrance Stairs-Number of Steps: 4 Home Layout: Two level;Able to live on main level with bedroom/bathroom Home Equipment: Cane - single point;Crutches;Shower seat - built in      Prior Function Level of Independence: Independent         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 [employer]        Extremity/Trunk Assessment   Upper Extremity Assessment Upper Extremity Assessment: Overall WFL for tasks assessed    Lower Extremity Assessment Lower Extremity Assessment: Overall WFL for tasks assessed    Cervical / Trunk Assessment Cervical / Trunk Assessment: Normal  Communication   Communication: No difficulties  Cognition Arousal/Alertness: Awake/alert Behavior During Therapy: WFL for tasks assessed/performed Overall Cognitive Status:  Within Functional Limits for tasks assessed                                        General Comments General comments (skin integrity, edema, etc.): VSS on RA, pt reports she did have a visual field cut on left side last night but vision is back to baseline currently     Exercises     Assessment/Plan    PT Assessment Patent does not need any further PT services  PT Problem List         PT Treatment Interventions      PT Goals (Current goals can be found in the Care Plan section)       Frequency     Barriers to discharge        Co-evaluation               AM-PAC PT "6 Clicks" Mobility  Outcome Measure Help needed turning from your back to your side while in a flat bed without using bedrails?: None Help needed moving from lying on your back to sitting on the side of a flat bed without using bedrails?: None Help needed moving to and from a bed to a chair (including a wheelchair)?: None Help needed standing up from a chair using your arms (e.g., wheelchair or bedside chair)?: None Help needed to walk in hospital room?: None Help needed climbing 3-5 steps with a railing? : None 6 Click Score: 24    End of Session   Activity Tolerance: Patient tolerated treatment well Patient left: in chair;with call bell/phone within reach;with family/visitor present Nurse Communication: Mobility status      Time: 4008-6761 PT Time Calculation (min) (ACUTE ONLY): 20 min   Charges:   PT Evaluation $PT Eval Low Complexity: 1 Low          Arlyss Gandy, PT, DPT Acute Rehabilitation Pager: [phone]   Arlyss Gandy 03/12/2020, 11:38 AM

## 2020-03-12 NOTE — Progress Notes (Signed)
STROKE TEAM PROGRESS NOTE   SUBJECTIVE (INTERVAL HISTORY) Her husband is at the bedside.  Overall her condition is completely resolved. She recounted HPI with me. She was driving yesterday and had acute onset weird feeling on the right with right arm and leg heaviness, weakness. No HA or facial involvement. Received tPA and symptoms resolved last night 9-10pm. Currently neuro intact.   OBJECTIVE Temp:  [97.7 F (36.5 C)-98.3 F (36.8 C)] 97.8 F (36.6 C) (09/08 1155) Pulse Rate:  [58-92] 71 (09/08 1155) Cardiac Rhythm: Normal sinus rhythm (09/08 0700) Resp:  [10-22] 12 (09/08 1155) BP: (94-137)/(43-94) 108/52 (09/08 1155) SpO2:  [98 %-100 %] 99 % (09/08 1155) Weight:  [90.1 kg] 90.1 kg (09/07 1400)  Recent Labs  Lab 03/11/20 1432  GLUCAP 112*   Recent Labs  Lab 03/11/20 1434 03/11/20 1443  NA 138 140  K 3.4* 3.4*  CL 105 104  CO2 21*  --   GLUCOSE 114* 110*  BUN 8 8  CREATININE 0.76 0.50  CALCIUM 8.8*  --    Recent Labs  Lab 03/11/20 1434  AST 20  ALT 20  ALKPHOS 71  BILITOT 0.9  PROT 6.9  ALBUMIN 4.0   Recent Labs  Lab 03/11/20 1434 03/11/20 1443  WBC 6.9  --   NEUTROABS 3.5  --   HGB 13.1 13.3  HCT 38.3 39.0  MCV 92.5  --   PLT 226  --    No results for input(s): CKTOTAL, CKMB, CKMBINDEX, TROPONINI in the last 168 hours. Recent Labs    03/11/20 1434  LABPROT 12.1  INR 0.9   No results for input(s): COLORURINE, LABSPEC, PHURINE, GLUCOSEU, HGBUR, BILIRUBINUR, KETONESUR, PROTEINUR, UROBILINOGEN, NITRITE, LEUKOCYTESUR in the last 72 hours.  Invalid input(s): APPERANCEUR     Component Value Date/Time   CHOL 172 03/12/2020 0612   TRIG 150 (H) 03/12/2020 0612   HDL 48 03/12/2020 0612   CHOLHDL 3.6 03/12/2020 0612   VLDL 30 03/12/2020 0612   LDLCALC 94 03/12/2020 0612   Lab Results  Component Value Date   HGBA1C 5.3 03/12/2020   No results found for: LABOPIA, COCAINSCRNUR, LABBENZ, AMPHETMU, THCU, LABBARB  No results for input(s): ETH in the  last 168 hours.  I have personally reviewed the radiological images below and agree with the radiology interpretations.  CT HEAD CODE STROKE WO CONTRAST  Result Date: 03/11/2020 CLINICAL DATA:  Code stroke.  Right-sided weakness EXAM: CT HEAD WITHOUT CONTRAST TECHNIQUE: Contiguous axial images were obtained from the base of the skull through the vertex without intravenous contrast. COMPARISON:  None. FINDINGS: Brain: No acute intracranial hemorrhage, mass effect, or edema. Question of small area of loss of gray-white differentiation in the left parietal lobe (series 3, images 21 and 22). Gray-white differentiation is otherwise preserved. Ventricles and sulci are normal in size and configuration. There is no extra-axial fluid collection. Vascular: No hyperdense vessel. Skull: Unremarkable. Sinuses/Orbits: Left maxillary sinus retention cyst. Left frontal sinus mucosal thickening with occlusion of outflow. Orbits are unremarkable. Other: Mastoid air cells are clear. ASPECTS (Alberta Stroke Program Early CT Score) - Ganglionic level infarction (caudate, lentiform nuclei, internal capsule, insula, M1-M3 cortex): 7 - Supraganglionic infarction (M4-M6 cortex): 2-3 Total score (0-10 with 10 being normal): 9-10 IMPRESSION: No acute intracranial hemorrhage or definite evidence of acute infarction. Questionable small area of loss of gray-white differentiation in the left parietal lobe. ASPECT score is 9-10. Initial results were communicated to Dr. Wilford Corner at 2:44 pmon 9/7/2021by text page via the [facility]  messaging system. Electronically Signed   By: Guadlupe Spanish M.D.   On: 03/11/2020 14:48   CT ANGIO HEAD CODE STROKE  Result Date: 03/11/2020 CLINICAL DATA:  Right-sided weakness EXAM: CT ANGIOGRAPHY HEAD AND NECK TECHNIQUE: Multidetector CT imaging of the head and neck was performed using the standard protocol during bolus administration of intravenous contrast. Multiplanar CT image reconstructions and MIPs were obtained  to evaluate the vascular anatomy. Carotid stenosis measurements (when applicable) are obtained utilizing NASCET criteria, using the distal internal carotid diameter as the denominator. CONTRAST:  40mL OMNIPAQUE IOHEXOL 350 MG/ML SOLN COMPARISON:  None. FINDINGS: Motion artifact is present but study is diagnostic. CTA NECK Aortic arch: Great vessel origins are patent. Right carotid system: Patent. No measurable stenosis or evidence of dissection. Left carotid system: Patent. No measurable stenosis or evidence of dissection. Vertebral arteries: Patent. Right vertebral artery is dominant with small caliber left vertebral artery. No measurable stenosis or evidence of dissection. Skeleton: Cervical spine degenerative changes primarily at C5-C6. Other neck: No mass or adenopathy. Upper chest: No apical lung mass. Review of the MIP images confirms the above findings CTA HEAD Anterior circulation: Intracranial internal carotid arteries patent. Anterior and middle cerebral arteries are patent. Posterior circulation: Intracranial vertebral arteries patent. Left vertebral artery terminates as a PICA. Basilar artery is patent. Posterior cerebral arteries are. Venous sinuses: Patent as allowed by contrast bolus timing. Review of the MIP images confirms the above findings IMPRESSION: No large vessel occlusion or hemodynamically significant stenosis. Initial results were discussed by telephone at the time of interpretation on 03/11/2020 at 2:55 pm with provider ASHISH Wilford Corner , who verbally acknowledged these results. Electronically Signed   By: Guadlupe Spanish M.D.   On: 03/11/2020 15:05   CT ANGIO NECK CODE STROKE  Result Date: 03/11/2020 CLINICAL DATA:  Right-sided weakness EXAM: CT ANGIOGRAPHY HEAD AND NECK TECHNIQUE: Multidetector CT imaging of the head and neck was performed using the standard protocol during bolus administration of intravenous contrast. Multiplanar CT image reconstructions and MIPs were obtained to evaluate  the vascular anatomy. Carotid stenosis measurements (when applicable) are obtained utilizing NASCET criteria, using the distal internal carotid diameter as the denominator. CONTRAST:  72mL OMNIPAQUE IOHEXOL 350 MG/ML SOLN COMPARISON:  None. FINDINGS: Motion artifact is present but study is diagnostic. CTA NECK Aortic arch: Great vessel origins are patent. Right carotid system: Patent. No measurable stenosis or evidence of dissection. Left carotid system: Patent. No measurable stenosis or evidence of dissection. Vertebral arteries: Patent. Right vertebral artery is dominant with small caliber left vertebral artery. No measurable stenosis or evidence of dissection. Skeleton: Cervical spine degenerative changes primarily at C5-C6. Other neck: No mass or adenopathy. Upper chest: No apical lung mass. Review of the MIP images confirms the above findings CTA HEAD Anterior circulation: Intracranial internal carotid arteries patent. Anterior and middle cerebral arteries are patent. Posterior circulation: Intracranial vertebral arteries patent. Left vertebral artery terminates as a PICA. Basilar artery is patent. Posterior cerebral arteries are. Venous sinuses: Patent as allowed by contrast bolus timing. Review of the MIP images confirms the above findings IMPRESSION: No large vessel occlusion or hemodynamically significant stenosis. Initial results were discussed by telephone at the time of interpretation on 03/11/2020 at 2:55 pm with provider ASHISH Wilford Corner , who verbally acknowledged these results. Electronically Signed   By: Guadlupe Spanish M.D.   On: 03/11/2020 15:05    PHYSICAL EXAM  Temp:  [97.7 F (36.5 C)-98.3 F (36.8 C)] 97.8 F (36.6 C) (09/08 1155) Pulse  Rate:  [58-92] 71 (09/08 1155) Resp:  [10-22] 12 (09/08 1155) BP: (94-137)/(43-94) 108/52 (09/08 1155) SpO2:  [98 %-100 %] 99 % (09/08 1155) Weight:  [90.1 kg] 90.1 kg (09/07 1400)  General - Well nourished, well developed, in no apparent  distress.  Ophthalmologic - fundi not visualized due to noncooperation.  Cardiovascular - Regular rhythm and rate.  Mental Status -  Level of arousal and orientation to time, place, and person were intact. Language including expression, naming, repetition, comprehension was assessed and found intact. Attention span and concentration were normal. Recent and remote memory were intact. Fund of Knowledge was assessed and was intact.  Cranial Nerves II - XII - II - Visual field intact OU. III, IV, VI - Extraocular movements intact. V - Facial sensation intact bilaterally. VII - Facial movement intact bilaterally. VIII - Hearing & vestibular intact bilaterally. X - Palate elevates symmetrically. XI - Chin turning & shoulder shrug intact bilaterally. XII - Tongue protrusion intact.  Motor Strength - The patient's strength was normal in all extremities and pronator drift was absent.  Bulk was normal and fasciculations were absent.   Motor Tone - Muscle tone was assessed at the neck and appendages and was normal.  Reflexes - The patient's reflexes were symmetrical in all extremities and she had no pathological reflexes.  Sensory - Light touch, temperature/pinprick were assessed and were symmetrical.    Coordination - The patient had normal movements in the hands and feet with no ataxia or dysmetria.  Tremor was absent.  Gait and Station - deferred.   ASSESSMENT/PLAN Ms. Analys L Kantner is a 49 y.o. female with history of hashimoto thyroiditis admitted for right arm and leg weakness. tPA given.    Stroke:  left brain infarct, source unclear  CT head   CTA head and neck neg  MRI  Brain pending  MRI C-spine pending  2D Echo pending  Recommend TEE if TTE unrevealing  LDL 94  HgbA1c 5.3  UDS pending  hypercoag and autoimmune labs pending  SCDs for VTE prophylaxis  No antithrombotic prior to admission, now on No antithrombotic within 24h tPA window  Patient counseled  to be compliant with her antithrombotic medications  Ongoing aggressive stroke risk factor management  Therapy recommendations:  none  Disposition:  pending  BP management . BP goal < 180/105 post tPA.  Long term BP goal normotensive  Hyperlipidemia  Home meds:  none   LDL 94, goal < 70  Now on lipitor 20  Continue statin at discharge  Other Stroke Risk Factors  Obesity, Body mass index is 35.19 kg/m.   Other Active Problems  Hashimoto thyroiditis - not on synthroid at home per husband  Hospital day # 1  This patient is critically ill due to stroke s/p tPA and at significant risk of neurological worsening, death form recurrent stroke, hemorrhage from tPA. This patient's care requires constant monitoring of vital signs, hemodynamics, respiratory and cardiac monitoring, review of multiple databases, neurological assessment, discussion with family, other specialists and medical decision making of high complexity. I spent 35 minutes of neurocritical care time in the care of this patient. I had long discussion with pt and husband at bedside, updated pt current condition, treatment plan and potential prognosis, and answered all the questions. They expressed understanding and appreciation.    Marvel Plan, MD PhD Stroke Neurology 03/12/2020 1:14 PM    To contact Stroke Continuity provider, please refer to [email or web address]. After hours, contact General Neurology

## 2020-03-12 NOTE — Progress Notes (Signed)
  Echocardiogram 2D Echocardiogram has been performed.  Tye Savoy 03/12/2020, 2:59 PM

## 2020-03-13 ENCOUNTER — Encounter (HOSPITAL_COMMUNITY)
Admission: EM | Disposition: A | Discharge status: Home / Self Care | Payer: Self-pay | Source: Home / Self Care | Attending: Neurology

## 2020-03-13 ENCOUNTER — Inpatient Hospital Stay (HOSPITAL_COMMUNITY): Payer: BC Managed Care – PPO | Admitting: Anesthesiology

## 2020-03-13 ENCOUNTER — Encounter (HOSPITAL_COMMUNITY): Payer: Self-pay | Admitting: Student in an Organized Health Care Education/Training Program

## 2020-03-13 ENCOUNTER — Inpatient Hospital Stay (HOSPITAL_COMMUNITY): Payer: BC Managed Care – PPO

## 2020-03-13 DIAGNOSIS — Q211 Atrial septal defect: Secondary | ICD-10-CM

## 2020-03-13 DIAGNOSIS — I639 Cerebral infarction, unspecified: Secondary | ICD-10-CM

## 2020-03-13 DIAGNOSIS — Z8774 Personal history of (corrected) congenital malformations of heart and circulatory system: Secondary | ICD-10-CM

## 2020-03-13 DIAGNOSIS — I361 Nonrheumatic tricuspid (valve) insufficiency: Secondary | ICD-10-CM

## 2020-03-13 HISTORY — PX: TEE WITHOUT CARDIOVERSION: SHX5443

## 2020-03-13 HISTORY — PX: BUBBLE STUDY: SHX6837

## 2020-03-13 LAB — BASIC METABOLIC PANEL
Anion gap: 10 (ref 5–15)
BUN: 8 mg/dL (ref 6–20)
CO2: 23 mmol/L (ref 22–32)
Calcium: 8.7 mg/dL — ABNORMAL LOW (ref 8.9–10.3)
Chloride: 104 mmol/L (ref 98–111)
Creatinine, Ser: 0.64 mg/dL (ref 0.44–1.00)
GFR calc Af Amer: >60 mL/min (ref >60)
GFR calc non Af Amer: >60 mL/min (ref >60)
Glucose, Bld: 105 mg/dL — ABNORMAL HIGH (ref 70–99)
Potassium: 3.9 mmol/L (ref 3.5–5.1)
Sodium: 137 mmol/L (ref 135–145)

## 2020-03-13 LAB — CBC
HCT: 35.3 % — ABNORMAL LOW (ref 36.0–46.0)
Hemoglobin: 12.1 g/dL (ref 12.0–15.0)
MCH: 31.9 pg (ref 26.0–34.0)
MCHC: 34.3 g/dL (ref 30.0–36.0)
MCV: 93.1 fL (ref 80.0–100.0)
Platelets: 207 10*3/uL (ref 150–400)
RBC: 3.79 MIL/uL — ABNORMAL LOW (ref 3.87–5.11)
RDW: 12.3 % (ref 11.5–15.5)
WBC: 8.1 10*3/uL (ref 4.0–10.5)
nRBC: 0 % (ref 0.0–0.2)

## 2020-03-13 LAB — RPR: RPR Ser Ql: NONREACTIVE

## 2020-03-13 LAB — THYROGLOBULIN ANTIBODY: Thyroglobulin Antibody: 1.5 IU/mL — ABNORMAL HIGH (ref 0.0–0.9)

## 2020-03-13 LAB — THYROID PEROXIDASE ANTIBODY: Thyroperoxidase Ab SerPl-aCnc: 55 IU/mL — ABNORMAL HIGH (ref 0–34)

## 2020-03-13 LAB — C-REACTIVE PROTEIN: CRP: 0.7 mg/dL (ref <1.0)

## 2020-03-13 LAB — SEDIMENTATION RATE: Sed Rate: 0 mm/hr (ref 0–22)

## 2020-03-13 SURGERY — ECHOCARDIOGRAM, TRANSESOPHAGEAL
Anesthesia: Monitor Anesthesia Care

## 2020-03-13 MED ORDER — SODIUM CHLORIDE 0.9 % IV SOLN: INTRAVENOUS | Status: DC

## 2020-03-13 MED ORDER — PHENYLEPHRINE 40 MCG/ML (10ML) SYRINGE FOR IV PUSH (FOR BLOOD PRESSURE SUPPORT)
PREFILLED_SYRINGE | INTRAVENOUS | Status: DC | PRN
  Administered 2020-03-13: 80 ug via INTRAVENOUS

## 2020-03-13 MED ORDER — PROPOFOL 500 MG/50ML IV EMUL
INTRAVENOUS | Status: DC | PRN
  Administered 2020-03-13: 150 ug/kg/min via INTRAVENOUS

## 2020-03-13 MED ORDER — PROPOFOL 10 MG/ML IV BOLUS
INTRAVENOUS | Status: DC | PRN
  Administered 2020-03-13: 50 mg via INTRAVENOUS
  Administered 2020-03-13: 25 mg via INTRAVENOUS
  Administered 2020-03-13: 50 mg via INTRAVENOUS

## 2020-03-13 MED ORDER — LIDOCAINE 2% (20 MG/ML) 5 ML SYRINGE
INTRAMUSCULAR | Status: DC | PRN
  Administered 2020-03-13: 60 mg via INTRAVENOUS
  Administered 2020-03-13: 40 mg via INTRAVENOUS

## 2020-03-13 MED ORDER — ASPIRIN EC 81 MG PO TBEC
81.0000 mg | DELAYED_RELEASE_TABLET | Freq: Every day | ORAL | Status: DC
  Administered 2020-03-14: 81 mg via ORAL
  Filled 2020-03-13 (×2): qty 1

## 2020-03-13 MED ORDER — CLOPIDOGREL BISULFATE 75 MG PO TABS
75.0000 mg | ORAL_TABLET | Freq: Every day | ORAL | Status: DC
  Administered 2020-03-13 – 2020-03-14 (×2): 75 mg via ORAL
  Filled 2020-03-13 (×2): qty 1

## 2020-03-13 NOTE — Interval H&P Note (Signed)
History and Physical Interval Note:  03/13/2020 8:02 AM  [name]  has presented today for surgery, with the diagnosis of STROKE.  The various methods of treatment have been discussed with the patient and family. After consideration of risks, benefits and other options for treatment, the patient has consented to  Procedure(s): TRANSESOPHAGEAL ECHOCARDIOGRAM (TEE) (N/A) as a surgical intervention.  The patient's history has been reviewed, patient examined, no change in status, stable for surgery.  I have reviewed the patient's chart and labs.  Questions were answered to the patient's satisfaction.     Armanda Magic

## 2020-03-13 NOTE — CV Procedure (Signed)
    PROCEDURE NOTE:  Procedure:  Transesophageal echocardiogram Operator:  Armanda Magic, MD Indications:  CVA Complications: None  During this procedure the patient is administered a total of Propofol 350 mg and Lidocaine 100mg  to achieve and maintain moderate conscious sedation.  The patient's heart rate, blood pressure, and oxygen saturation are monitored continuously during the procedure by anesthesia.   Results: Normal LV size and low normal LV function with EF 50-55% Normal RV size and function Moderately dilated RA with bulging of interatrial septum to the left consistent with increased RA pressure. Normal LA and LA appendage with normal emptying velocity of the LAA Normal TV with moderate TR Normal PV with trivial PR Normal MV leaflets.  There is a mobile target in the LV that appears to emanate off the MV and likely represents a torn or redundant chordae tendinae.  There are several very small jets of trivial MR. Normal trileaflet AV with trivial AR. There is a PFO by coloflow doppler and agitated saline contrast injection.  Normal thoracic and ascending aorta.  The patient tolerated the procedure well and was transferred back to their room in stable condition.  Signed: , MD [facility]

## 2020-03-13 NOTE — Progress Notes (Signed)
STROKE TEAM PROGRESS NOTE   SUBJECTIVE (INTERVAL HISTORY) Her husband is at the bedside. She is reclining in bed, no acute event overnight, neuro stable. Pt had TEE this am showed positive for PFO. Will need TCD bubble study and LE venous doppler. MRI showed right parieto-occipital region and left frontal lobe 3-4 small/punctate infarcts, embolic pattern. Pt did have ASD closure procedure at age of 20 at [facility].   OBJECTIVE Temp:  [97 F (36.1 C)-98.8 F (37.1 C)] 98 F (36.7 C) (09/09 1159) Pulse Rate:  [57-73] 68 (09/09 1159) Cardiac Rhythm: Normal sinus rhythm (09/09 0755) Resp:  [13-21] 16 (09/09 1159) BP: (99-123)/(26-69) 107/53 (09/09 1159) SpO2:  [97 %-100 %] 100 % (09/09 1159) Weight:  [89.9 kg] 89.9 kg (09/08 2116)  Recent Labs  Lab 03/11/20 1432  GLUCAP 112*   Recent Labs  Lab 03/11/20 1434 03/11/20 1443 03/12/20 1811 03/13/20 0405  NA 138 140 140 137  K 3.4* 3.4* 3.9 3.9  CL 105 104 106 104  CO2 21*  --  24 23  GLUCOSE 114* 110* 102* 105*  BUN 8 8 7 8   CREATININE 0.76 0.50 0.64 0.64  CALCIUM 8.8*  --  8.6* 8.7*  PHOS  --   --  3.4  --    Recent Labs  Lab 03/11/20 1434 03/12/20 1811  AST 20  --   ALT 20  --   ALKPHOS 71  --   BILITOT 0.9  --   PROT 6.9  --   ALBUMIN 4.0 3.4*   Recent Labs  Lab 03/11/20 1434 03/11/20 1443 03/13/20 0405  WBC 6.9  --  8.1  NEUTROABS 3.5  --   --   HGB 13.1 13.3 12.1  HCT 38.3 39.0 35.3*  MCV 92.5  --  93.1  PLT 226  --  207   No results for input(s): CKTOTAL, CKMB, CKMBINDEX, TROPONINI in the last 168 hours. Recent Labs    03/11/20 1434  LABPROT 12.1  INR 0.9   No results for input(s): COLORURINE, LABSPEC, PHURINE, GLUCOSEU, HGBUR, BILIRUBINUR, KETONESUR, PROTEINUR, UROBILINOGEN, NITRITE, LEUKOCYTESUR in the last 72 hours.  Invalid input(s): APPERANCEUR     Component Value Date/Time   CHOL 172 03/12/2020 0612   TRIG 150 (H) 03/12/2020 0612   HDL 48 03/12/2020 0612   CHOLHDL 3.6 03/12/2020 0612   VLDL 30  03/12/2020 0612   LDLCALC 94 03/12/2020 0612   Lab Results  Component Value Date   HGBA1C 5.3 03/12/2020      Component Value Date/Time   LABOPIA NONE DETECTED 03/12/2020 1226   COCAINSCRNUR NONE DETECTED 03/12/2020 1226   LABBENZ NONE DETECTED 03/12/2020 1226   AMPHETMU NONE DETECTED 03/12/2020 1226   THCU NONE DETECTED 03/12/2020 1226   LABBARB NONE DETECTED 03/12/2020 1226    No results for input(s): ETH in the last 168 hours.  I have personally reviewed the radiological images below and agree with the radiology interpretations.  MR BRAIN WO CONTRAST  Result Date: 03/12/2020 CLINICAL DATA:  Right-sided weakness EXAM: MRI HEAD WITHOUT CONTRAST TECHNIQUE: Multiplanar, multiecho pulse sequences of the brain and surrounding structures were obtained without intravenous contrast. COMPARISON:  None. FINDINGS: Brain: Two small foci of mildly reduced diffusion are present in the right parieto-occipital region. Additional two small foci in the left frontal lobe involving the precentral gyrus just medial to the hand motor region. There is no intracranial hemorrhage. There is no intracranial mass or significant mass effect. There is no hydrocephalus or extra-axial fluid collection.  Ventricles and sulci are normal in size and configuration. Patchy foci of T2 hyperintensity in the supratentorial white matter are nonspecific but may reflect mild chronic microvascular ischemic changes. Vascular: Major vessel flow voids at the skull base are preserved. Skull and upper cervical spine: Normal marrow signal is preserved. Sinuses/Orbits: Mild mucosal thickening with a left maxillary sinus retention cyst. Orbits are unremarkable. Other: Sella is unremarkable.  Mastoid air cells are clear. IMPRESSION: Small late acute infarcts in the right parieto-occipital region and left frontal lobe. The latter accounts for reported right-sided weakness. Mild chronic microvascular ischemic changes. Electronically Signed   By:  Guadlupe Spanish M.D.   On: 03/12/2020 13:47   MR CERVICAL SPINE WO CONTRAST  Result Date: 03/12/2020 CLINICAL DATA:  Acute onset right arm and leg weakness 03/12/2019. No known injury. EXAM: MRI CERVICAL SPINE WITHOUT CONTRAST TECHNIQUE: Multiplanar, multisequence MR imaging of the cervical spine was performed. No intravenous contrast was administered. COMPARISON:  None. FINDINGS: Alignment: There is mild kyphosis centered about the C5-6 level. Vertebrae: No fracture, evidence of discitis, or bone lesion. Cord: Normal signal throughout. Posterior Fossa, vertebral arteries, paraspinal tissues: Mucous retention cyst or polyp in the left maxillary sinus is partially imaged. Disc levels: C2-3: Negative. C3-4: Minimal central protrusion. The central canal and neural foramina are widely patent. C4-5: Very shallow disc bulge. The central canal and foramina are widely patent. C5-6: The patient has a shallow broad-based disc bulge and left worse than right uncovertebral disease. The ventral thecal sac is effaced. Mild to moderate foraminal narrowing is worse on the left. C6-7: Shallow disc bulge and mild uncovertebral disease without stenosis. C7-T1: Negative. IMPRESSION: No finding to explain the patient's symptoms. The cervical cord is normal in appearance. Spondylosis is most notable at C5-6 where a shallow disc bulge effaces the ventral thecal sac and uncovertebral disease causes mild to moderate foraminal narrowing, worse on the left. Mild kyphosis centered about the C5-6 level. Mucous retention cyst or polyp left maxillary sinus. Electronically Signed   By: Drusilla Kanner M.D.   On: 03/12/2020 13:49   ECHOCARDIOGRAM COMPLETE  Result Date: 03/12/2020    ECHOCARDIOGRAM REPORT   Patient Name:   [name] Date of Exam: 03/12/2020 Medical Rec #:  [id number]      Height:       63.0 in Accession #:    [id number]     Weight:       198.6 lb Date of Birth:  [birth year]      BSA:          1.928 m Patient Age:    49 years        BP:           108/52 mmHg Patient Gender: F              HR:           58 bpm. Exam Location:  Inpatient Procedure: 2D Echo Indications:    Stroke I163.9  History:        Patient has no prior history of Echocardiogram examinations.  Sonographer:    Thurman Coyer RDCS (AE) Referring Phys: [id number] ASHISH ARORA IMPRESSIONS  1. Inferior basal wall hypokinesis . Left ventricular ejection fraction, by estimation, is 50 to 55%. The left ventricle has low normal function. The left ventricle demonstrates regional wall motion abnormalities (see scoring diagram/findings for description). Left ventricular diastolic parameters were normal.  2. Right ventricular systolic function is normal. The right ventricular size is normal. There  is normal pulmonary artery systolic pressure.  3. Right atrial size was mildly dilated.  4. The mitral valve is normal in structure. No evidence of mitral valve regurgitation. No evidence of mitral stenosis.  5. Tricuspid valve regurgitation is moderate to severe.  6. The aortic valve is tricuspid. Aortic valve regurgitation is not visualized. Mild aortic valve sclerosis is present, with no evidence of aortic valve stenosis.  7. The inferior vena cava is normal in size with greater than 50% respiratory variability, suggesting right atrial pressure of 3 mmHg. FINDINGS  Left Ventricle: Inferior basal wall hypokinesis. Left ventricular ejection fraction, by estimation, is 50 to 55%. The left ventricle has low normal function. The left ventricle demonstrates regional wall motion abnormalities. The left ventricular internal cavity size was normal in size. There is no left ventricular hypertrophy. Left ventricular diastolic parameters were normal. Right Ventricle: The right ventricular size is normal. No increase in right ventricular wall thickness. Right ventricular systolic function is normal. There is normal pulmonary artery systolic pressure. The tricuspid regurgitant velocity is 2.06 m/s, and   with an assumed right atrial pressure of 8 mmHg, the estimated right ventricular systolic pressure is 25.0 mmHg. Left Atrium: Left atrial size was normal in size. Right Atrium: Right atrial size was mildly dilated. Pericardium: There is no evidence of pericardial effusion. Mitral Valve: The mitral valve is normal in structure. No evidence of mitral valve regurgitation. No evidence of mitral valve stenosis. Tricuspid Valve: The tricuspid valve is normal in structure. Tricuspid valve regurgitation is moderate to severe. No evidence of tricuspid stenosis. Aortic Valve: The aortic valve is tricuspid. Aortic valve regurgitation is not visualized. Mild aortic valve sclerosis is present, with no evidence of aortic valve stenosis. Pulmonic Valve: The pulmonic valve was normal in structure. Pulmonic valve regurgitation is not visualized. No evidence of pulmonic stenosis. Aorta: The aortic root is normal in size and structure. Venous: The inferior vena cava is normal in size with greater than 50% respiratory variability, suggesting right atrial pressure of 3 mmHg. IAS/Shunts: No atrial level shunt detected by color flow Doppler.  LEFT VENTRICLE PLAX 2D LVIDd:         4.20 cm  Diastology LVIDs:         2.60 cm  LV e' medial:    6.20 cm/s LV PW:         0.90 cm  LV E/e' medial:  14.4 LV IVS:        0.80 cm  LV e' lateral:   8.17 cm/s LVOT diam:     2.20 cm  LV E/e' lateral: 10.9 LV SV:         80 LV SV Index:   41 LVOT Area:     3.80 cm  RIGHT VENTRICLE RV S prime:     10.20 cm/s TAPSE (M-mode): 1.7 cm LEFT ATRIUM             Index       RIGHT ATRIUM           Index LA diam:        3.00 cm 1.56 cm/m  RA Area:     16.80 cm LA Vol (A2C):   66.2 ml 34.34 ml/m RA Volume:   43.00 ml  22.30 ml/m LA Vol (A4C):   26.5 ml 13.74 ml/m LA Biplane Vol: 42.8 ml 22.20 ml/m  AORTIC VALVE LVOT Vmax:   92.00 cm/s LVOT Vmean:  68.000 cm/s LVOT VTI:    0.210 m  AORTA  Ao Root diam: 3.10 cm MITRAL VALVE               TRICUSPID VALVE MV Area  (PHT): 3.60 cm    TR Peak grad:   17.0 mmHg MV Decel Time: 211 msec    TR Vmax:        206.00 cm/s MV E velocity: 89.10 cm/s MV A velocity: 76.30 cm/s  SHUNTS MV E/A ratio:  1.17        Systemic VTI:  0.21 m                            Systemic Diam: 2.20 cm Charlton Haws MD Electronically signed by Charlton Haws MD Signature Date/Time: 03/12/2020/3:27:20 PM    Final    CT HEAD CODE STROKE WO CONTRAST  Result Date: 03/11/2020 CLINICAL DATA:  Code stroke.  Right-sided weakness EXAM: CT HEAD WITHOUT CONTRAST TECHNIQUE: Contiguous axial images were obtained from the base of the skull through the vertex without intravenous contrast. COMPARISON:  None. FINDINGS: Brain: No acute intracranial hemorrhage, mass effect, or edema. Question of small area of loss of gray-white differentiation in the left parietal lobe (series 3, images 21 and 22). Gray-white differentiation is otherwise preserved. Ventricles and sulci are normal in size and configuration. There is no extra-axial fluid collection. Vascular: No hyperdense vessel. Skull: Unremarkable. Sinuses/Orbits: Left maxillary sinus retention cyst. Left frontal sinus mucosal thickening with occlusion of outflow. Orbits are unremarkable. Other: Mastoid air cells are clear. ASPECTS (Alberta Stroke Program Early CT Score) - Ganglionic level infarction (caudate, lentiform nuclei, internal capsule, insula, M1-M3 cortex): 7 - Supraganglionic infarction (M4-M6 cortex): 2-3 Total score (0-10 with 10 being normal): 9-10 IMPRESSION: No acute intracranial hemorrhage or definite evidence of acute infarction. Questionable small area of loss of gray-white differentiation in the left parietal lobe. ASPECT score is 9-10. Initial results were communicated to Dr. Wilford Corner at 2:44 pmon 9/7/2021by text page via the [facility] messaging system. Electronically Signed   By: Guadlupe Spanish M.D.   On: 03/11/2020 14:48   CT ANGIO HEAD CODE STROKE  Result Date: 03/11/2020 CLINICAL DATA:  Right-sided weakness  EXAM: CT ANGIOGRAPHY HEAD AND NECK TECHNIQUE: Multidetector CT imaging of the head and neck was performed using the standard protocol during bolus administration of intravenous contrast. Multiplanar CT image reconstructions and MIPs were obtained to evaluate the vascular anatomy. Carotid stenosis measurements (when applicable) are obtained utilizing NASCET criteria, using the distal internal carotid diameter as the denominator. CONTRAST:  74mL OMNIPAQUE IOHEXOL 350 MG/ML SOLN COMPARISON:  None. FINDINGS: Motion artifact is present but study is diagnostic. CTA NECK Aortic arch: Great vessel origins are patent. Right carotid system: Patent. No measurable stenosis or evidence of dissection. Left carotid system: Patent. No measurable stenosis or evidence of dissection. Vertebral arteries: Patent. Right vertebral artery is dominant with small caliber left vertebral artery. No measurable stenosis or evidence of dissection. Skeleton: Cervical spine degenerative changes primarily at C5-C6. Other neck: No mass or adenopathy. Upper chest: No apical lung mass. Review of the MIP images confirms the above findings CTA HEAD Anterior circulation: Intracranial internal carotid arteries patent. Anterior and middle cerebral arteries are patent. Posterior circulation: Intracranial vertebral arteries patent. Left vertebral artery terminates as a PICA. Basilar artery is patent. Posterior cerebral arteries are. Venous sinuses: Patent as allowed by contrast bolus timing. Review of the MIP images confirms the above findings IMPRESSION: No large vessel occlusion or hemodynamically significant stenosis. Initial results were  discussed by telephone at the time of interpretation on 03/11/2020 at 2:55 pm with provider ASHISH Wilford CornerARORA , who verbally acknowledged these results. Electronically Signed   By: Guadlupe SpanishPraneil  Patel M.D.   On: 03/11/2020 15:05   CT ANGIO NECK CODE STROKE  Result Date: 03/11/2020 CLINICAL DATA:  Right-sided weakness EXAM: CT  ANGIOGRAPHY HEAD AND NECK TECHNIQUE: Multidetector CT imaging of the head and neck was performed using the standard protocol during bolus administration of intravenous contrast. Multiplanar CT image reconstructions and MIPs were obtained to evaluate the vascular anatomy. Carotid stenosis measurements (when applicable) are obtained utilizing NASCET criteria, using the distal internal carotid diameter as the denominator. CONTRAST:  75mL OMNIPAQUE IOHEXOL 350 MG/ML SOLN COMPARISON:  None. FINDINGS: Motion artifact is present but study is diagnostic. CTA NECK Aortic arch: Great vessel origins are patent. Right carotid system: Patent. No measurable stenosis or evidence of dissection. Left carotid system: Patent. No measurable stenosis or evidence of dissection. Vertebral arteries: Patent. Right vertebral artery is dominant with small caliber left vertebral artery. No measurable stenosis or evidence of dissection. Skeleton: Cervical spine degenerative changes primarily at C5-C6. Other neck: No mass or adenopathy. Upper chest: No apical lung mass. Review of the MIP images confirms the above findings CTA HEAD Anterior circulation: Intracranial internal carotid arteries patent. Anterior and middle cerebral arteries are patent. Posterior circulation: Intracranial vertebral arteries patent. Left vertebral artery terminates as a PICA. Basilar artery is patent. Posterior cerebral arteries are. Venous sinuses: Patent as allowed by contrast bolus timing. Review of the MIP images confirms the above findings IMPRESSION: No large vessel occlusion or hemodynamically significant stenosis. Initial results were discussed by telephone at the time of interpretation on 03/11/2020 at 2:55 pm with provider ASHISH Wilford CornerARORA , who verbally acknowledged these results. Electronically Signed   By: Guadlupe SpanishPraneil  Patel M.D.   On: 03/11/2020 15:05    PHYSICAL EXAM  Temp:  [97 F (36.1 C)-98.8 F (37.1 C)] 98 F (36.7 C) (09/09 1159) Pulse Rate:   [57-73] 68 (09/09 1159) Resp:  [13-21] 16 (09/09 1159) BP: (99-123)/(26-69) 107/53 (09/09 1159) SpO2:  [97 %-100 %] 100 % (09/09 1159) Weight:  [89.9 kg] 89.9 kg (09/08 2116)  General - Well nourished, well developed, in no apparent distress.  Ophthalmologic - fundi not visualized due to noncooperation.  Cardiovascular - Regular rhythm and rate.  Mental Status -  Level of arousal and orientation to time, place, and person were intact. Language including expression, naming, repetition, comprehension was assessed and found intact. Attention span and concentration were normal. Recent and remote memory were intact. Fund of Knowledge was assessed and was intact.  Cranial Nerves II - XII - II - Visual field intact OU. III, IV, VI - Extraocular movements intact. V - Facial sensation intact bilaterally. VII - Facial movement intact bilaterally. VIII - Hearing & vestibular intact bilaterally. X - Palate elevates symmetrically. XI - Chin turning & shoulder shrug intact bilaterally. XII - Tongue protrusion intact.  Motor Strength - The patient's strength was normal in all extremities and pronator drift was absent.  Bulk was normal and fasciculations were absent.   Motor Tone - Muscle tone was assessed at the neck and appendages and was normal.  Reflexes - The patient's reflexes were symmetrical in all extremities and she had no pathological reflexes.  Sensory - Light touch, temperature/pinprick were assessed and were symmetrical.    Coordination - The patient had normal movements in the hands and feet with no ataxia or dysmetria.  Tremor  was absent.  Gait and Station - deferred.   ASSESSMENT/PLAN Ms. [name] is a 49 y.o. 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 tPA given.    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 Continue DAPT for 3 weeks and then ASA alone.  Patient counseled to be compliant with her antithrombotic medications  Ongoing aggressive stroke risk factor management  Therapy recommendations:  none  Disposition:  pending  ASD/PFO  Hx of ASD closure surgery at age of 69  TEE showed positive PFO  TCD bubble study pending  LE venous doppler pending  Hyperlipidemia  Home meds:  none   LDL 94, goal < 70  Now on lipitor 20  Continue statin at discharge  Hx of Hashimoto thyroiditis  not on synthroid at home per husband  TSH 6.13 (H), but FT4 normal  Thyroid peroxidase Ab 55 (H)  Thyroglobulin Ab pending  Will inform her PCP for further management  Other Stroke Risk Factors  Obesity, Body mass index is 35.11 kg/m.   Other Active Problems    Hospital day # 2   Marvel Plan, MD PhD Stroke Neurology 03/13/2020 12:23 PM    To contact Stroke Continuity provider, please refer to [email or web address]. After hours, contact General Neurology

## 2020-03-13 NOTE — Transfer of Care (Signed)
Immediate Anesthesia Transfer of Care Note  Patient: [name]  Procedure(s) Performed: TRANSESOPHAGEAL ECHOCARDIOGRAM (TEE) (N/A ) BUBBLE STUDY  Patient Location: PACU and Endoscopy Unit  Anesthesia Type:MAC  Level of Consciousness: awake and drowsy  Airway & Oxygen Therapy: Patient Spontanous Breathing and Patient connected to nasal cannula oxygen  Post-op Assessment: Report given to RN and Post -op Vital signs reviewed and stable  Post vital signs: Reviewed and stable  Last Vitals:  Vitals Value Taken Time  BP    Temp    Pulse 68 03/13/20 0939  Resp 15 03/13/20 0939  SpO2 100 % 03/13/20 0939  Vitals shown include unvalidated device data.  Last Pain:  Vitals:   03/13/20 0822  TempSrc: Oral  PainSc: 0-No pain         Complications: No complications documented.

## 2020-03-13 NOTE — Progress Notes (Signed)
  Echocardiogram Echocardiogram Transesophageal has been performed.  Janalyn Harder 03/13/2020, 9:43 AM

## 2020-03-13 NOTE — Anesthesia Procedure Notes (Signed)
Procedure Name: MAC Date/Time: 03/13/2020 9:22 AM Performed by: Renato Shin, CRNA Pre-anesthesia Checklist: Patient identified, Emergency Drugs available, Suction available and Patient being monitored Patient Re-evaluated:Patient Re-evaluated prior to induction Oxygen Delivery Method: Nasal cannula Preoxygenation: Pre-oxygenation with 100% oxygen Induction Type: IV induction Placement Confirmation: positive ETCO2 and breath sounds checked- equal and bilateral Dental Injury: Teeth and Oropharynx as per pre-operative assessment

## 2020-03-13 NOTE — Anesthesia Preprocedure Evaluation (Addendum)
Anesthesia Evaluation  Patient identified by MRN, date of birth, ID band Patient awake    Reviewed: Allergy & Precautions, NPO status , Patient's Chart, lab work & pertinent test results  History of Anesthesia Complications Negative for: history of anesthetic complications  Airway Mallampati: II  TM Distance: >3 FB Neck ROM: Full    Dental  (+) Dental Advisory Given   Pulmonary former smoker,    Pulmonary exam normal        Cardiovascular Normal cardiovascular exam+ Valvular Problems/Murmurs (TR)    '21 TTE - Inferior basal wall hypokinesis. EF 50 to 55%. Tricuspid valve regurgitation is moderate to severe. Mild aortic valve sclerosis is present, with no evidence of aortic valve stenosis.     Neuro/Psych CVA, No Residual Symptoms negative psych ROS   GI/Hepatic negative GI ROS, Neg liver ROS,   Endo/Other  Hypothyroidism   Renal/GU negative Renal ROS  Female GU complaint     Musculoskeletal negative musculoskeletal ROS (+)   Abdominal   Peds  Hematology negative hematology ROS (+)   Anesthesia Other Findings Covid test negative   Reproductive/Obstetrics                            Anesthesia Physical Anesthesia Plan  ASA: III  Anesthesia Plan: MAC   Post-op Pain Management:    Induction: Intravenous  PONV Risk Score and Plan: 2 and Propofol infusion and Treatment may vary due to age or medical condition  Airway Management Planned: Nasal Cannula and Natural Airway  Additional Equipment: None  Intra-op Plan:   Post-operative Plan:   Informed Consent: I have reviewed the patients History and Physical, chart, labs and discussed the procedure including the risks, benefits and alternatives for the proposed anesthesia with the patient or authorized representative who has indicated his/her understanding and acceptance.       Plan Discussed with: CRNA and  Anesthesiologist  Anesthesia Plan Comments:        Anesthesia Quick Evaluation

## 2020-03-13 NOTE — Progress Notes (Signed)
OT Cancellation Note  Patient Details Name: [name] MRN: [id number] DOB: 11/19/1970   Cancelled Treatment:    Reason Eval/Treat Not Completed: OT screened, no needs identified, will sign off.  Patient reports at baseline level of mobility and cognition, denies sensation, strength, vision, or coordination deficits.   OT will sign off.  If further needs arise, please re-consult.   Barry Brunner, OT Acute Rehabilitation Services Pager [phone] Office [phone]    [name] 03/13/2020, 10:59 AM

## 2020-03-13 NOTE — Anesthesia Postprocedure Evaluation (Signed)
Anesthesia Post Note  Patient: Shaday L Viana  Procedure(s) Performed: TRANSESOPHAGEAL ECHOCARDIOGRAM (TEE) (N/A ) BUBBLE STUDY     Patient location during evaluation: PACU Anesthesia Type: MAC Level of consciousness: awake and alert Pain management: pain level controlled Vital Signs Assessment: post-procedure vital signs reviewed and stable Respiratory status: spontaneous breathing, nonlabored ventilation and respiratory function stable Cardiovascular status: stable and blood pressure returned to baseline Anesthetic complications: no   No complications documented.  Last Vitals:  Vitals:   03/13/20 0950 03/13/20 0957  BP: (!) 103/56 (!) 113/54  Pulse: 61 66  Resp: 16 14  Temp:    SpO2: 100% 100%    Last Pain:  Vitals:   03/13/20 0950  TempSrc:   PainSc: 0-No pain                 Audry Pili

## 2020-03-13 NOTE — TOC Transition Note (Signed)
Transition of Care [facility]) - CM/SW Discharge Note   Patient Details  Name: [name] MRN: [id number] Date of Birth: [birth year]  [facility] Care [facility]) CM/SW Contact:  Kermit Balo, RN Phone Number: 03/13/2020, 12:13 PM   Clinical Narrative:    Pt discharging home with self care. No f/u per PT/OT and no DME needs.  Pt has transportation home.   Final next level of care: Home/Self Care Barriers to Discharge: No Barriers Identified   Patient Goals and CMS Choice        Discharge Placement                       Discharge Plan and Services                                     Social Determinants of Health (SDOH) Interventions     Readmission Risk Interventions No flowsheet data found.

## 2020-03-13 NOTE — Interval H&P Note (Signed)
History and Physical Interval Note:  03/13/2020 9:01 AM  [name]  has presented today for surgery, with the diagnosis of STROKE.  The various methods of treatment have been discussed with the patient and family. After consideration of risks, benefits and other options for treatment, the patient has consented to  Procedure(s): TRANSESOPHAGEAL ECHOCARDIOGRAM (TEE) (N/A) as a surgical intervention.  The patient's history has been reviewed, patient examined, no change in status, stable for surgery.  I have reviewed the patient's chart and labs.  Questions were answered to the patient's satisfaction.     Armanda Magic

## 2020-03-14 ENCOUNTER — Inpatient Hospital Stay (HOSPITAL_COMMUNITY): Payer: BC Managed Care – PPO

## 2020-03-14 DIAGNOSIS — Z8639 Personal history of other endocrine, nutritional and metabolic disease: Secondary | ICD-10-CM

## 2020-03-14 DIAGNOSIS — I639 Cerebral infarction, unspecified: Secondary | ICD-10-CM

## 2020-03-14 LAB — CBC
HCT: 37.3 % (ref 36.0–46.0)
Hemoglobin: 12.8 g/dL (ref 12.0–15.0)
MCH: 32.2 pg (ref 26.0–34.0)
MCHC: 34.3 g/dL (ref 30.0–36.0)
MCV: 93.7 fL (ref 80.0–100.0)
Platelets: 219 10*3/uL (ref 150–400)
RBC: 3.98 MIL/uL (ref 3.87–5.11)
RDW: 12.2 % (ref 11.5–15.5)
WBC: 10.2 10*3/uL (ref 4.0–10.5)
nRBC: 0 % (ref 0.0–0.2)

## 2020-03-14 LAB — BASIC METABOLIC PANEL
Anion gap: 9 (ref 5–15)
BUN: 10 mg/dL (ref 6–20)
CO2: 26 mmol/L (ref 22–32)
Calcium: 8.9 mg/dL (ref 8.9–10.3)
Chloride: 103 mmol/L (ref 98–111)
Creatinine, Ser: 0.71 mg/dL (ref 0.44–1.00)
GFR calc Af Amer: >60 mL/min (ref >60)
GFR calc non Af Amer: >60 mL/min (ref >60)
Glucose, Bld: 107 mg/dL — ABNORMAL HIGH (ref 70–99)
Potassium: 3.6 mmol/L (ref 3.5–5.1)
Sodium: 138 mmol/L (ref 135–145)

## 2020-03-14 LAB — HOMOCYSTEINE: Homocysteine: 11.5 umol/L (ref 0.0–14.5)

## 2020-03-14 LAB — ANTI-DNA ANTIBODY, DOUBLE-STRANDED: ds DNA Ab: <1 IU/mL (ref 0–9)

## 2020-03-14 LAB — LUPUS ANTICOAGULANT PANEL
DRVVT: 30.3 s (ref 0.0–47.0)
PTT Lupus Anticoagulant: 27.6 s (ref 0.0–51.9)

## 2020-03-14 LAB — ANTINUCLEAR ANTIBODIES, IFA: ANA Ab, IFA: NEGATIVE

## 2020-03-14 LAB — ANCA TITERS
Atypical P-ANCA titer: <1:20 {titer}
C-ANCA: <1:20 {titer}
P-ANCA: <1:20 {titer}

## 2020-03-14 LAB — MPO/PR-3 (ANCA) ANTIBODIES
ANCA Proteinase 3: 3.7 U/mL — ABNORMAL HIGH (ref 0.0–3.5)
Myeloperoxidase Abs: <9 U/mL (ref 0.0–9.0)

## 2020-03-14 MED ORDER — CLOPIDOGREL BISULFATE 75 MG PO TABS: 75.0000 mg | ORAL_TABLET | Freq: Every day | ORAL | 2 refills | Status: DC

## 2020-03-14 MED ORDER — ATORVASTATIN CALCIUM 20 MG PO TABS: 20.0000 mg | ORAL_TABLET | Freq: Every day | ORAL | 2 refills | Status: DC

## 2020-03-14 MED ORDER — ASPIRIN 81 MG PO TBEC: 81.0000 mg | DELAYED_RELEASE_TABLET | Freq: Every day | ORAL | 11 refills | Status: DC

## 2020-03-14 NOTE — Plan of Care (Signed)
Adequate for discharge.

## 2020-03-14 NOTE — Discharge Summary (Addendum)
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 No diagnosis    Past Surgical History:  Procedure Laterality Date  . ENDOMETRIAL ABLATION     Uterine/heavy menses  . SHOULDER SURGERY     Left,/bone spurs    Family History No family history on file.  Social History  reports that she has quit smoking. She has never used smokeless tobacco. No history on file for alcohol use and drug use.  Allergies as of 03/14/2020   No Known Allergies     Medication List    TAKE these medications   aspirin 81 MG EC tablet Take 1 tablet (81 mg total) by mouth daily. Swallow whole. Start taking on: March 15, 2020   atorvastatin 20 MG tablet Commonly known as: LIPITOR Take 1 tablet (20 mg total) by mouth daily. Start taking on: March 15, 2020   clopidogrel 75 MG tablet Commonly known as: PLAVIX Take 1 tablet (75 mg total) by mouth daily. Start taking on: March 15, 2020   levothyroxine 175 MCG tablet Commonly known as: SYNTHROID Take 175 mcg by mouth daily.       HOME MEDICATIONS PRIOR TO ADMISSION Medications Prior to Admission  Medication Sig Dispense Refill  . levothyroxine (SYNTHROID) 175 MCG tablet Take 175 mcg by mouth daily.  (Patient not taking: Reported on 03/11/2020)       HOSPITAL MEDICATIONS . aspirin EC  81 mg Oral Daily  . atorvastatin  20 mg Oral Daily  . Chlorhexidine Gluconate Cloth  6 each Topical Daily  . clopidogrel  75 mg  Oral Daily    LABORATORY STUDIES CBC    Component Value Date/Time   WBC 10.2 03/14/2020 0204   RBC 3.98 03/14/2020 0204   HGB 12.8 03/14/2020 0204   HCT 37.3 03/14/2020 0204   PLT 219 03/14/2020 0204   MCV 93.7 03/14/2020 0204   MCH 32.2 03/14/2020 0204   MCHC 34.3 03/14/2020 0204   RDW 12.2 03/14/2020 0204   LYMPHSABS 2.6 03/11/2020 1434   MONOABS 0.5 03/11/2020 1434   EOSABS 0.2 03/11/2020 1434   BASOSABS 0.1 03/11/2020 1434   CMP    Component Value Date/Time   NA 138 03/14/2020 0204   K 3.6 03/14/2020 0204   CL 103 03/14/2020 0204   CO2 26 03/14/2020 0204   GLUCOSE 107 (H) 03/14/2020 0204   BUN 10 03/14/2020 0204   CREATININE 0.71 03/14/2020 0204   CALCIUM 8.9 03/14/2020 0204   PROT 6.9 03/11/2020 1434   ALBUMIN 3.4 (L) 03/12/2020 1811   AST 20 03/11/2020 1434   ALT 20 03/11/2020 1434   ALKPHOS 71 03/11/2020 1434   BILITOT 0.9 03/11/2020 1434   GFRNONAA >60 03/14/2020 0204   GFRAA >60 03/14/2020 0204   COAGS Lab Results  Component Value Date   INR 0.9 03/11/2020   Lipid Panel    Component Value Date/Time   CHOL 172 03/12/2020  0612   TRIG 150 (H) 03/12/2020 0612   HDL 48 03/12/2020 0612   CHOLHDL 3.6 03/12/2020 0612   VLDL 30 03/12/2020 0612   LDLCALC 94 03/12/2020 0612   HgbA1C  Lab Results  Component Value Date   HGBA1C 5.3 03/12/2020   Urinalysis    Component Value Date/Time   BILIRUBINUR neg 12/22/2011 1538   PROTEINUR neg 12/22/2011 1538   UROBILINOGEN 0.2 12/22/2011 1538   NITRITE neg 12/22/2011 1538   LEUKOCYTESUR Negative 12/22/2011 1538   Urine Drug Screen     Component Value Date/Time   LABOPIA NONE DETECTED 03/12/2020 1226   COCAINSCRNUR NONE DETECTED 03/12/2020 1226   LABBENZ NONE DETECTED 03/12/2020 1226   AMPHETMU NONE DETECTED 03/12/2020 1226   THCU NONE DETECTED 03/12/2020 1226   LABBARB NONE DETECTED 03/12/2020 1226    Alcohol Level No results found for: ETH   SIGNIFICANT DIAGNOSTIC STUDIES   MR BRAIN WO  CONTRAST 03/12/2020 IMPRESSION:  Small late acute infarcts in the right parieto-occipital region and left frontal lobe. The latter accounts for reported right-sided weakness. Mild chronic microvascular ischemic changes.   MR CERVICAL SPINE WO CONTRAST 03/12/2020 IMPRESSION:  No finding to explain the patient's symptoms. The cervical cord is normal in appearance. Spondylosis is most notable at C5-6 where a shallow disc bulge effaces the ventral thecal sac and uncovertebral disease causes mild to moderate foraminal narrowing, worse on the left. Mild kyphosis centered about the C5-6 level. Mucous retention cyst or polyp left maxillary sinus.   ECHOCARDIOGRAM COMPLETE 03/12/2020 IMPRESSIONS   1. Inferior basal wall hypokinesis.  Left ventricular ejection fraction, by estimation, is 50 to 55%. The left ventricle has low normal function. The left ventricle demonstrates regional wall motion abnormalities (see scoring diagram/findings for description). Left ventricular diastolic parameters were normal.   2. Right ventricular systolic function is normal. The right ventricular size is normal. There is normal pulmonary artery systolic pressure.   3. Right atrial size was mildly dilated.   4. The mitral valve is normal in structure. No evidence of mitral valve regurgitation. No evidence of mitral stenosis.   5. Tricuspid valve regurgitation is moderate to severe.   6. The aortic valve is tricuspid. Aortic valve regurgitation is not visualized. Mild aortic valve sclerosis is present, with no evidence of aortic valve stenosis.   7. The inferior vena cava is normal in size with greater than 50% respiratory variability, suggesting right atrial pressure of 3 mmHg.   ECHO TEE 03/13/2020 IMPRESSIONS   1. Left ventricular ejection fraction, by estimation, is 50 to 55%. The left ventricle has low normal function. The left ventricle has no regional wall motion abnormalities.   2. Right ventricular systolic function is  normal. The right ventricular size is normal.   3. No left atrial/left atrial appendage thrombus was detected.   4. Right atrial size was moderately dilated.   5. There is a mobile target in the LV that appears to emanate off the MV and likely represents a torn or redundant chordae tendinae. There are several very small jets of trivial MR.. The mitral valve is abnormal. Trivial mitral valve regurgitation. No evidence of mitral stenosis.   6. Tricuspid valve regurgitation is moderate.   7. The aortic valve is normal in structure. Aortic valve regurgitation is not visualized. No aortic stenosis is present.   8. The inferior vena cava is normal in size with greater than 50% respiratory variability, suggesting right atrial pressure of 3 mmHg.   9. Evidence of  atrial level shunting detected by color flow Doppler. Agitated saline contrast bubble study was positive with shunting observed within 3-6 cardiac cycles suggestive of interatrial shunt. There is a small patent foramen ovale with bidirectional shunting across atrial septum.  Conclusion(s)/Recommendation(s):  Normal biventricular function without evidence of hemodynamically significant valvular heart disease. Positive for small PFO with bidirectional flow by agitated saline contrast study.   CT HEAD CODE STROKE WO CONTRAST  03/11/2020 IMPRESSION:  No acute intracranial hemorrhage or definite evidence of acute infarction. Questionable small area of loss of gray-white differentiation in the left parietal lobe. ASPECT score is 9-10.  CT ANGIO HEAD CODE STROKE CT ANGIO NECK CODE STROKE 03/11/2020 IMPRESSION:  No large vessel occlusion or hemodynamically significant stenosis.   HISTORY OF PRESENT ILLNESS (From Dr Bess HarvestArora's H&P on 03/11/20) [name] is a 49 y.o. 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  She attempted to move her arm and leg but found it very heavy and difficult to move the arm and leg.  EMS was called.  They evaluated her and found right arm and leg drift, and brought her in as an acute code stroke. Denies headaches or history of migraines. Denies similar symptoms in the past. Denies prior history of strokes Denies sick contacts.  Denies chest pain shortness of breath nausea vomiting.  Denies visual symptoms. No recent travel-no history of clots in legs. LKW: 1:55 PM 03/11/2020 tpa given?:  Yes Premorbid modified Rankin scale (mRS): 0  HOSPITAL COURSE Ms. Wealthy L Deloria Gutierrez is a 49 y.o. 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 tPA given.    Stroke: right parieto-occipital region and left frontal lobe small infarcts, embolic pattern, source uncertain, concerning for ASD/PFO related  CT head no acute finding  CTA head and neck neg  MRI  Brain right parieto-occipital region and left frontal lobe small infartcts  MRI C-spine normal cervical spinal cord  2D Echo EF 50-55%  TEE unremarkable but positive for PFO  TCD bubble study - negative  LE venous doppler - negative  LDL 94  HgbA1c 5.3  UDS neg  hypercoag and autoimmune labs - so far neg, rest pending  No indication for LP or heart monitoring at this time - will refer to Dr. Pearlean BrownieSethi for second opinion  SCDs for VTE prophylaxis  No antithrombotic prior to admission, now on ASA 81 and plavix 75 DAPT for stroke prevention. Continue DAPT until sees Dr. Excell Seltzerooper as outpt.  Patient counseled to be compliant with her antithrombotic medications  Ongoing aggressive stroke risk factor management  Therapy recommendations:   None  Disposition:  Discharge to home   ?? ASD/PFO  Hx of ASD closure surgery at age of 49  TEE showed small PFO  TCD bubble study - no evidence of right to left shunt at rest and with valsalva.   Will refer to Dr. Excell Seltzerooper for further evaluation of PFO/residue ASD   Hyperlipidemia  Home meds:  none   LDL 94, goal < 70  Now on lipitor 20  Continue statin at discharge  Hx of  Hashimoto thyroiditis  Not on synthroid at home per husband  TSH 6.13 (H), but FT4 normal  Thyroid peroxidase Ab 55 (H)  Thyroglobulin Ab - 1.5 (H)  Will inform her PCP for further management  Other Stroke Risk Factors  Obesity, Body mass index is 35.11 kg/m.    DISCHARGE EXAM Vitals:   03/13/20 2347 03/14/20 0404 03/14/20 0736 03/14/20 1143  BP: (!) 105/59 100/68 104/65 110/61  Pulse: 72 61 66 68  Resp: 18 17 16 16   Temp: (!) 97.5 F (36.4 C) 97.7 F (36.5 C) 97.7 F (36.5 C) 98 F (36.7 C)  TempSrc: Oral Oral Oral Oral  SpO2: 98% 99% 99% 99%  Weight:       General - Well nourished, well developed, in no apparent distress.  Ophthalmologic - fundi not visualized due to noncooperation.  Cardiovascular - Regular rhythm and rate.  Mental Status -  Level of arousal and orientation to time, place, and person were intact. Language including expression, naming, repetition, comprehension was assessed and found intact. Attention span and concentration were normal. Recent and remote memory were intact. Fund of Knowledge was assessed and was intact.  Cranial Nerves II - XII - II - Visual field intact OU. III, IV, VI - Extraocular movements intact. V - Facial sensation intact bilaterally. VII - Facial movement intact bilaterally. VIII - Hearing & vestibular intact bilaterally. X - Palate elevates symmetrically. XI - Chin turning & shoulder shrug intact bilaterally. XII - Tongue protrusion intact.  Motor Strength - The patient's strength was normal in all extremities and  pronator drift was absent.  Bulk was normal and fasciculations were absent.   Motor Tone - Muscle tone was assessed at the neck and appendages and was normal.  Reflexes - The patient's reflexes were symmetrical in all extremities and she had no pathological reflexes.  Sensory - Light touch, temperature/pinprick were assessed and were symmetrical.    Coordination - The patient had normal movements in the hands and feet with no ataxia or dysmetria.  Tremor was absent.  Gait and Station - deferred.  DISCHARGE INSTRUCTIONS GIVEN TO THE PATIENT 1. Gradually increase your activity as tolerated.  2. No follow-up therapy has been recommended at this time. 3. Discuss your abnormal thyroid tests with Dr at your next office visit in two weeks.   DISCHARGE DIET   Diet Order            Diet Heart Room service appropriate? Yes; Fluid consistency: Thin  Diet effective now                liquids  DISCHARGE PLAN  Disposition:  Discharge to home  ASA 81 and plavix 75 DAPT for stroke prevention until seen by Dr Milinda Antis and Dr Excell Seltzer.  Ongoing risk factor control by Primary Care Physician at time of discharge  Follow-up [place], Pearlean Brownie, MD in 2 weeks. Follow-up abnormal thyroid studies.  Follow-up in [facility] in 4 weeks, office to schedule an appointment.   A referral will be made to Dr Audrie Gallus (Cardiology) for evaluation of PFO.  40 minutes were spent preparing discharge.  Tonny Bollman, MD PhD Stroke Neurology 03/14/2020 3:35 PM

## 2020-03-14 NOTE — Discharge Instructions (Signed)
1. Gradually increase your activity as tolerated.  2. No follow-up therapy has been recommended at this time. 3. Discuss your abnormal thyroid tests with Dr Milinda Antis at your next office visit in two weeks.

## 2020-03-15 LAB — BETA-2-GLYCOPROTEIN I ABS, IGG/M/A
Beta-2 Glyco I IgG: <9 GPI IgG units (ref 0–20)
Beta-2-Glycoprotein I IgA: <9 GPI IgA units (ref 0–25)
Beta-2-Glycoprotein I IgM: <9 GPI IgM units (ref 0–32)

## 2020-03-15 LAB — CARDIOLIPIN ANTIBODIES, IGG, IGM, IGA
Anticardiolipin IgA: <9 U/mL (ref 0–11)
Anticardiolipin IgG: 9 GPL U/mL (ref 0–14)
Anticardiolipin IgM: <9 [MPL'U]/mL (ref 0–12)

## 2020-03-18 ENCOUNTER — Encounter (HOSPITAL_COMMUNITY): Payer: Self-pay | Admitting: Cardiology

## 2020-03-19 ENCOUNTER — Other Ambulatory Visit: Payer: Self-pay | Admitting: *Deleted

## 2020-03-19 NOTE — Patient Outreach (Signed)
[facility]) Care Management  03/19/2020  [name] [birth year] [id number]   Subjective: Telephone call to patient's home  / mobile number, no answer, left HIPAA compliant voicemail message, and requested call back.    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     Assessment: Received [employer] EMMI Stroke [employer] Alert follow up referral on 03/17/2020.  Red Flag Alert Trigger, Day #1, patient answered no to the following question: Scheduled a follow-up appointment?   [facility] EMMI follow up pending patient contact.      Plan: RNCM will send unsuccessful outreach letter, [facility] pamphlet, handout: Know Before You Go, will call patient for 2nd telephone outreach attempt within 4 business days, [facility] EMMI follow up, and proceed with case closure, after 4th unsuccessful outreach call.      Linnaea Ahn H. Gardiner Barefoot, BSN, CCM [facility] Care Management [facility] Telephonic CM Phone: [phone] Fax: [phone]

## 2020-03-21 ENCOUNTER — Other Ambulatory Visit: Payer: Self-pay | Admitting: *Deleted

## 2020-03-21 NOTE — Patient Outreach (Signed)
[facility]) Care Management  03/21/2020  [name] 12/01/1970 [id number]   Subjective: Received voicemail message from [name], states she is returning call, and requested call back.   Telephone call to patient's home  / mobile number, no answer, left HIPAA compliant voicemail message, and requested call back.    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     Assessment: Received [employer] EMMI Stroke [employer] Alert follow up referral on 03/17/2020.  Red Flag Alert Trigger, Day #1, patient answered no to the following question: Scheduled a follow-up appointment?   [facility] EMMI follow up pending patient contact.      Plan: RNCM has  sent unsuccessful outreach letter, [facility] pamphlet, handout: Know Before You Go, will call patient for 3rd telephone outreach attempt within 4 business days, [facility] EMMI follow up, and proceed with case closure, after 4th unsuccessful outreach call.      Ivadell Gaul H. Gardiner Barefoot, BSN, CCM [facility] Care Management [facility] Telephonic CM Phone: [phone] Fax: [phone]

## 2020-03-26 ENCOUNTER — Other Ambulatory Visit: Payer: Self-pay | Admitting: *Deleted

## 2020-03-26 ENCOUNTER — Encounter: Payer: Self-pay | Admitting: *Deleted

## 2020-03-26 NOTE — Patient Outreach (Signed)
[facility]) Care Management  03/26/2020  [name] May 02, 1971 [id number]   Subjective:Received voicemail message from [facility], states she is returning call, and requested call back.   Telephone call to patient's home / mobile number, no answer, left HIPAA compliant voicemail message, and requested call back.    Objective:Per KPN (Knowledge Performance Now, point of care tool) and chart review,patient hospitalized 03/11/2020 - 03/14/2020 forRight parieto-occipital region and left frontal lobe small infarcts, embolic pattern, source uncertain, status post IV TPA. 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     Assessment: Received [employer] EMMI Stroke [employer] Alert follow up referral on 03/17/2020. Red Flag Alert Trigger, Day #1, patient answered no to the following question: Scheduled a follow-up appointment?[facility] EMMI follow up pending patient contact.     Plan:RNCM has  sent unsuccessful outreach letter, [facility] pamphlet, handout: Know Before You Go, will call patient for 4th telephone outreach attempt within 21 business days, [facility] EMMI follow up, and proceed with case closure,after 4th unsuccessful outreach call.      Dustee Bottenfield H. Gardiner Barefoot, BSN, CCM [facility] Care Management [facility] Telephonic CM Phone: [phone] Fax: [phone]

## 2020-03-26 NOTE — Patient Outreach (Signed)
[facility]) Care Management  [facility] Care Manager  03/26/2020   [name] 10-22-1970 [id number]  Subjective: Received voicemail message from [facility], states she is returning call, currently at lunch until 2:00pm, works 8am - 5pm, and requested call back.   .Telephone call to patient's home / mobile number, spoke with patient, and HIPAA verified.  Discussed [facility] Care Management [employer] EMMI Stroke Red Alert  follow up, patient voiced understanding, and is in agreement to follow up.   Patient states she is doing well, remembers receiving EMMI automated calls, and now has the follow up appointments in place: on 04/08/2020 with cardiologist, on 05/20/2020 with neurologist.   States before she had the stroke she was not routinely sick, has had a lot going on, and was taking care of other family members.  Discussed importance of self care, patient voices understanding, and states she will follow up.   Discussed importance of hospital follow up with primary MD, patient voices understanding, and states she will follow up as appropriate.   States she has not seen her primary MD for approximately 3 years and is planning to establish care with a new provider closer to her residence, in the near future.   States she has not seen her endocrinologist (Dr. Lurene Shadow)  for approximately 3 years and is planning to re-establish care with this provider as soon as possible to resume thyroid treatment.  Patient states she is able to manage self care and has assistance as needed.  Patient voices understanding of medical diagnosis and treatment plan.  States she is accessing her BCBS benefits as needed via member services number on back of card.  Discussed Advanced Directives, advised of [facility] Care Management  Advanced Directives packet resource, patient voices understanding, and is in agreement to be sent packet at this time.   RNCM advised of [employer] document scanning  system for Advanced Directives  documents via local providers office or hospitals, patient  voices understanding, and she will discuss accessing through patient's primary provider if needed in the future.    Patient states she does not have any education material, EMMI follow up, care coordination, care management, disease monitoring, transportation, community resource, or pharmacy needs at this time.  States she is very appreciative of the follow up, is in agreement  to receive 1 additional follow up call to assess for further CM needs, and is in agreement to receive [facility] Care Management EMMI follow up calls as needed.  States she has this RNCM's and [facility] Care Management contact information, will call if assistance needed prior to next patent outreach.       Objective: Per KPN (Knowledge Performance Now, point of care tool) and chart review,patient hospitalized 03/11/2020 - 03/14/2020 forRight parieto-occipital region and left frontal lobe small infarcts, embolic pattern, source uncertain, status post IV TPA. 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    Encounter Medications:  Outpatient Encounter Medications as of 03/26/2020  Medication Sig  . aspirin EC 81 MG EC tablet Take 1 tablet (81 mg total) by mouth daily. Swallow whole.  Marland Kitchen atorvastatin (LIPITOR) 20 MG tablet Take 1 tablet (20 mg total) by mouth daily.  . clopidogrel (PLAVIX) 75 MG tablet Take 1 tablet (75 mg total) by mouth daily.  Marland Kitchen levothyroxine (SYNTHROID) 175 MCG tablet Take 175 mcg by mouth daily.  (Patient not taking: Reported on 03/11/2020)   No facility-administered  encounter medications on file as of 03/26/2020.    Functional Status:  In your present state of health, do you have any difficulty performing the following activities: 03/12/2020  Hearing? N  Vision? N  Difficulty concentrating or making decisions? N  Walking or climbing stairs? N  Dressing  or bathing? N  Some recent data might be hidden    Fall/Depression Screening: No flowsheet data found. PHQ 2/9 Scores 03/26/2020  PHQ - 2 Score 0    Assessment: Received [employer] Commercial EMMI Stroke Red Flag Alert follow up referral on 03/17/2020. Red Flag Alert Trigger, Day #1, patient answered no to the following question: Scheduled a follow-up appointment? EMMI follow up completed and will follow up to assess further care management needs.      Plan: RNCM will send patient successful outreach letter, [facility] pamphlet, [facility] Advanced Directives packet, and magnet. RNCMwill call patient for  telephone outreach attempt within 21 business days, [facility] EMMI follow up, and proceed with case closure,after 4th unsuccessful outreach call.      Damone Fancher H. Gardiner Barefoot, BSN, CCM [facility] Care Management [facility] Telephonic CM Phone: [phone] Fax: [phone]

## 2020-04-08 ENCOUNTER — Ambulatory Visit: Payer: BC Managed Care – PPO | Admitting: Cardiology

## 2020-04-11 ENCOUNTER — Other Ambulatory Visit: Payer: Self-pay | Admitting: *Deleted

## 2020-04-11 NOTE — Patient Outreach (Signed)
[facility]) Care Management  04/11/2020  [name] [birth year] [id number]   Subjective:  Telephone call to patient's home  / mobile number, no answer, left HIPAA compliant voicemail message, and requested call back.    Objective: Per KPN (Knowledge Performance Now, point of care tool) and chart review,patient hospitalized 03/11/2020 - 03/14/2020 forRight parieto-occipital region and left frontal lobe small infarcts, embolic pattern, source uncertain, status post IV TPA. 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    Assessment: Received [employer] Commercial EMMI Stroke [employer] Alert follow up referral on 03/17/2020. Red Flag Alert Trigger, Day #1, patient answered no to the following question: Scheduled a follow-up appointment? EMMI follow up completed and will follow up to assess further care management needs.     Plan: RNCMwill call patient for  2ndtelephone outreach attempt within 4business days, [facility] EMMI follow up, and proceed with case closure,after 4th unsuccessful outreach call.      Mirinda Monte H. Gardiner Barefoot, BSN, CCM [facility] Care Management [facility] Telephonic CM Phone: [phone] Fax: [phone]

## 2020-04-14 ENCOUNTER — Other Ambulatory Visit: Payer: Self-pay | Admitting: *Deleted

## 2020-04-14 NOTE — Patient Outreach (Signed)
[facility]) Care Management  04/14/2020  Angla L Deloria Lair [birth year] [id number]   Subjective:  Telephone call to patient's home  / mobile number, no answer, left HIPAA compliant voicemail message, and requested call back. Telephone call from patient's home / mobile number, spoke with patient, and HIPAA verified.  States she is doing well, returning RNCM's call, she is still working on obtaining new primary MD, and endocrinologist.    States she called endocrinologist office, and is being required to obtain a new referral to see provider, since it has been 4 years, since her last visit, due to caring for other family members (daughter and father).   States the providers office is aware that she has had a stroke and follow up is needed soon as possible,.  RNCM advised patient to request to speak with provider's office manager if needed to see if the process can be expedited, or to ask if an exception can be made, patient voices understanding, and prefers to obtain a new provider.   States she will contact this RNCM if assistance needed and declined assistance at this time.  States her cardiologist appointment on 04/08/2020 was rescheduled due to a provider schedule conflict, and has been rescheduled for 04/21/2020.   Patient states she received [facility] Care Management information and Advanced Directives packet, has not had a chance to review, will review tonight, and contact RNCM if she has any questions.  RNCM advised patient , moving to another position after 04/17/2020.  States she has this RNCM's and [facility] Care Management contact information, will call if assistance needed.   Patient is aware 30 day EMMI automated calls are completed.  Patient states she does not have any education material, EMMI follow up, care coordination, care management, disease monitoring, transportation, community resource, or pharmacy needs at this time.  States she is very appreciative of the follow  up.    Objective:Per KPN (Knowledge Performance Now, point of care tool) and chart review,patient hospitalized 03/11/2020 - 03/14/2020 forRight parieto-occipital region and left frontal lobe small infarcts, embolic pattern, source uncertain, status post IV TPA. 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    Assessment:Received [employer] Commercial EMMI Stroke [employer] Alert follow up referral on 03/17/2020. Red Flag Alert Trigger, Day #1, patient answered no to the following question: Scheduled a follow-up appointment?EMMI follow up completed and no further care management needs.      Plan:RNCM will complete case closure due to follow up completed / no care management needs.  RNCM will not send MD case closure letter due to patient planning to obtain new provider.       Makaio Mach H. Gardiner Barefoot, BSN, CCM [facility] Care Management [facility] Telephonic CM Phone: [phone] Fax: [phone]

## 2020-04-16 ENCOUNTER — Ambulatory Visit: Payer: Self-pay | Admitting: *Deleted

## 2020-04-21 ENCOUNTER — Telehealth: Payer: Self-pay

## 2020-04-21 ENCOUNTER — Other Ambulatory Visit: Payer: Self-pay

## 2020-04-21 ENCOUNTER — Ambulatory Visit: Payer: BC Managed Care – PPO | Admitting: Cardiology

## 2020-04-21 NOTE — Telephone Encounter (Signed)
The patient was incorrectly placed on Dr. Norris Cross schedule today for evaluation of PFO. Scheduled the patient with Dr. Excell Seltzer 11/8. She confirmed appointment with check-out.

## 2020-04-22 ENCOUNTER — Telehealth: Payer: Self-pay | Admitting: Cardiovascular Disease

## 2020-04-22 NOTE — Telephone Encounter (Signed)
Left message for patient that currently there are no sooner availabilities for appointments but she will be called if something opens.

## 2020-04-22 NOTE — Telephone Encounter (Signed)
° ° °  Pt is calling, would like to know if se can get in sooner with Dr. Excell Seltzer

## 2020-05-12 ENCOUNTER — Encounter: Payer: Self-pay | Admitting: Cardiovascular Disease

## 2020-05-12 ENCOUNTER — Other Ambulatory Visit: Payer: Self-pay

## 2020-05-12 ENCOUNTER — Ambulatory Visit (INDEPENDENT_AMBULATORY_CARE_PROVIDER_SITE_OTHER): Payer: BC Managed Care – PPO | Admitting: Cardiovascular Disease

## 2020-05-12 VITALS — BP 128/72 | HR 76 | Ht 64.0 in | Wt 198.0 lb

## 2020-05-12 DIAGNOSIS — Q211 Atrial septal defect, unspecified: Secondary | ICD-10-CM

## 2020-05-12 DIAGNOSIS — I639 Cerebral infarction, unspecified: Secondary | ICD-10-CM

## 2020-05-12 NOTE — Patient Instructions (Addendum)
Medication Instructions:  Your provider recommends that you continue on your current medications as directed. Please refer to the Current Medication list given to you today.   *If you need a refill on your cardiac medications before your next appointment, please call your pharmacy*  Lab Work: TODAY! BMET If you have labs (blood work) drawn today and your tests are completely normal, you will receive your results only by: Marland Kitchen MyChart Message (if you have MyChart) OR . A paper copy in the mail If you have any lab test that is abnormal or we need to change your treatment, we will call you to review the results.  Testing/Procedures: Dr. Burt Knack recommends you have a CARDIAC CT.  Dr. Burt Knack recommends you have a LOOP RECORDER placed. You will be called to arrange an appointment with an EP specialist.  Follow-up: You will be called to arrange follow-up after your testing is complete.  CARDIAC CT INSTRUCTIONS: Your cardiac CT will be scheduled at one of the below locations:   [facility] [address] [place], [place] [address] (336) [place] [address] [place], [place] [address] [phone]  If scheduled at [facility], please arrive at the [facility] main entrance of [facility] 30 minutes prior to test start time. Proceed to the [facility] Radiology Department (first floor) to check-in and test prep.  If scheduled at [facility], please arrive 15 mins early for check-in and test prep.  Please follow these instructions carefully (unless otherwise directed):  On the Night Before the Test: . Be sure to Drink plenty of water. . Do not consume any caffeinated/decaffeinated beverages or chocolate 12 hours prior to your test. . Do not take any antihistamines 12 hours prior to your test.  On the Day of the Test: . Drink plenty of water. Do not drink any water  within one hour of the test. . Do not eat any food 4 hours prior to the test. . You may take your regular medications prior to the test.  . Take metoprolol (Lopressor) 100 mg two hours prior to test. . FEMALES- please wear underwire-free bra if available      After the Test: . Drink plenty of water. . After receiving IV contrast, you may experience a mild flushed feeling. This is normal. . On occasion, you may experience a mild rash up to 24 hours after the test. This is not dangerous. If this occurs, you can take Benadryl 25 mg and increase your fluid intake. . If you experience trouble breathing, this can be serious. If it is severe call 911 IMMEDIATELY. If it is mild, please call our office.   Once we have confirmed authorization from your insurance company, we will call you to set up a date and time for your test. Based on how quickly your insurance processes prior authorizations requests, please allow up to 4 weeks to be contacted for scheduling your Cardiac CT appointment. Be advised that routine Cardiac CT appointments could be scheduled as many as 8 weeks after your provider has ordered it.  For non-scheduling related questions, please contact the cardiac imaging nurse navigator should you have any questions/concerns: Marchia Bond, Cardiac Imaging Nurse Navigator Burley Saver, Interim Cardiac Imaging Nurse [facility] Direct Office Dial: [phone]   For scheduling needs, including cancellations and rescheduling, please call Vivien Rota at [phone], option 3.

## 2020-05-12 NOTE — Progress Notes (Signed)
Cardiology Office Note:    Date:  05/13/2020   ID:  [name], DOB [birth year], MRN [id number]  PCP:  Abner Greenspan, MD  [facility] Cardiologist:  Fransico Him, MD  [place] Electrophysiologist:  None   Referring MD: Abner Greenspan, MD   Chief Complaint  Patient presents with  . PFO evaluation    History of Present Illness:    [name] is a 49 y.o. female referred for PFO evaluation by Dr Erlinda Hong.  She presented March 11, 2020 with sudden onset of right-sided weakness involving both the arm and leg.  She was brought in by EMS as a code stroke and was found to have right parieto-occipital region and left frontal lobe infarcts with an embolic pattern.  She was treated with TPA.  Inpatient evaluation included a transcranial Doppler bubble study which was negative, lower extremity venous Doppler which was negative, hemoglobin A1c 5.3, urine drug screen negative, LDL cholesterol 94, CTA of the head and neck normal with no large vessel disease.  A TEE was suggestive of a small interatrial level shunt.  The patient is here with her husband today.  She is doing well since hospital discharge.  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  She has an interesting history and that she underwent cardiac surgery at age 20.  It is somewhat unclear exactly what congenital abnormality was treated as her surgery was about 30 years ago at [facility].  She states that they patched a defect in the back of her heart.  The surgeon had apparently discussed a valve that needed to be repaired, but after intraoperative evaluation this was not required.  She states that at the time of surgery, the right side of her heart was 3 times larger than it should be.  When she was a teenager, she worked at [employer] as a Psychologist, occupational.  She had heart palpitations 1 day and a pediatric cardiologist examined her, discovering this abnormality.  She has not had  any regular follow-up of her heart in many years.  Past Medical History:  Diagnosis Date  . No diagnosis     Past Surgical History:  Procedure Laterality Date  . BUBBLE STUDY  03/13/2020   Procedure: BUBBLE STUDY;  Surgeon: Sueanne Margarita, MD;  Location: [facility] ENDOSCOPY;  Service: Cardiovascular;;  . ENDOMETRIAL ABLATION     Uterine/heavy menses  . SHOULDER SURGERY     Left,/bone spurs  . TEE WITHOUT CARDIOVERSION N/A 03/13/2020   Procedure: TRANSESOPHAGEAL ECHOCARDIOGRAM (TEE);  Surgeon: Sueanne Margarita, MD;  Location: [facility] ENDOSCOPY;  Service: Cardiovascular;  Laterality: N/A;    Current Medications: Current Meds  Medication Sig  . aspirin EC 81 MG EC tablet Take 1 tablet (81 mg total) by mouth daily. Swallow whole.  Marland Kitchen atorvastatin (LIPITOR) 20 MG tablet Take 1 tablet (20 mg total) by mouth daily.  . clopidogrel (PLAVIX) 75 MG tablet Take 1 tablet (75 mg total) by mouth daily.     Allergies:   Patient has no known allergies.   Social History   Socioeconomic History  . Marital status: Married    Spouse name: Not on file  . Number of children: Not on file  . Years of education: Not on file  . Highest education level: Not on file  Occupational History  . Not on file  Tobacco Use  . Smoking status: Former Research scientist (life sciences)  . Smokeless tobacco: Never Used  Substance and Sexual Activity  . Alcohol use: Yes    Alcohol/week: 1.0 standard drink    Types: 1 Cans of beer per week    Comment: states 1 beer every other day or so   . Drug use: Not on file  . Sexual activity: Not on file  Other Topics Concern  . Not on file  Social History Narrative   Last Mamm: 04.07.10 @ [facility] BI-RADS Category 1: Negative^MM DIGITAL DG BILATERAL   Social Determinants of Health   Financial Resource Strain:   . Difficulty of Paying Living Expenses: Not on file  Food Insecurity:   . Worried About Charity fundraiser in the Last Year: Not on file  . Ran Out of Food in the Last Year: Not on file   Transportation Needs: No Transportation Needs  . Lack of Transportation (Medical): No  . Lack of Transportation (Non-Medical): No  Physical Activity:   . Days of Exercise per Week: Not on file  . Minutes of Exercise per Session: Not on file  Stress:   . Feeling of Stress : Not on file  Social Connections:   . Frequency of Communication with Friends and Family: Not on file  . Frequency of Social Gatherings with Friends and Family: Not on file  . Attends Religious Services: Not on file  . Active Member of Clubs or Organizations: Not on file  . Attends Archivist Meetings: Not on file  . Marital Status: Not on file     Family History: The patient's family hx is negative for congenital heart disease  ROS:   Please see the history of present illness.    All other systems reviewed and are negative.  EKGs/Labs/Other Studies Reviewed:    The following studies were reviewed today: TEE: IMPRESSIONS    1. Left ventricular ejection fraction, by estimation, is 50 to 55%. The  left ventricle has low normal function. The left ventricle has no regional  wall motion abnormalities.  2. Right ventricular systolic function is normal. The right ventricular  size is normal.  3. No left atrial/left atrial appendage thrombus was detected.  4. Right atrial size was moderately dilated.  5. There is a mobile target in the LV that appears to emanate off the MV  and likely represents a torn or redundant chordae tendinae. There are  several very small jets of trivial MR.. The mitral valve is abnormal.  Trivial mitral valve regurgitation. No  evidence of mitral stenosis.  6. Tricuspid valve regurgitation is moderate.  7. The aortic valve is normal in structure. Aortic valve regurgitation is  not visualized. No aortic stenosis is present.  8. The inferior vena cava is normal in size with greater than 50%  respiratory variability, suggesting right atrial pressure of 3 mmHg.  9.  Evidence of atrial level shunting detected by color flow Doppler.  Agitated saline contrast bubble study was positive with shunting observed  within 3-6 cardiac cycles suggestive of interatrial shunt. There is a  small patent foramen ovale with  bidirectional shunting across atrial septum.   Conclusion(s)/Recommendation(s): Normal biventricular function without  evidence of hemodynamically significant valvular heart disease. Positive  for small PFO with bidirectional flow by agitated saline contrast study.   IMPRESSIONS    1. Inferior basal wall hypokinesis . Left ventricular ejection fraction,  by estimation, is 50 to 55%. The left ventricle has low normal function.  The left ventricle demonstrates regional wall motion abnormalities (see  scoring diagram/findings for  description).  Left ventricular diastolic parameters were normal.  2. Right ventricular systolic function is normal. The right ventricular  size is normal. There is normal pulmonary artery systolic pressure.  3. Right atrial size was mildly dilated.  4. The mitral valve is normal in structure. No evidence of mitral valve  regurgitation. No evidence of mitral stenosis.  5. Tricuspid valve regurgitation is moderate to severe.  6. The aortic valve is tricuspid. Aortic valve regurgitation is not  visualized. Mild aortic valve sclerosis is present, with no evidence of  aortic valve stenosis.  7. The inferior vena cava is normal in size with greater than 50%  respiratory variability, suggesting right atrial pressure of 3 mmHg.   EKG:  EKG is ordered today.  The ekg ordered today demonstrates normal sinus rhythm with rightward axis, RSR prime in V1  Recent Labs: 03/11/2020: ALT 20 03/12/2020: TSH 6.130 03/14/2020: Hemoglobin 12.8; Platelets 219 05/12/2020: BUN 10; Creatinine, Ser 0.77; Potassium 3.9; Sodium 138  Recent Lipid Panel    Component Value Date/Time   CHOL 172 03/12/2020 0612   TRIG 150 (H) 03/12/2020 0612    HDL 48 03/12/2020 0612   CHOLHDL 3.6 03/12/2020 0612   VLDL 30 03/12/2020 0612   LDLCALC 94 03/12/2020 0612     Risk Assessment/Calculations:       Physical Exam:    VS:  BP 128/72   Pulse 76   Ht '5\' 4"'  (1.626 m)   Wt 198 lb (89.8 kg)   SpO2 97%   BMI 33.99 kg/m     Wt Readings from Last 3 Encounters:  05/12/20 198 lb (89.8 kg)  03/12/20 198 lb 3.1 oz (89.9 kg)  04/06/13 175 lb 8 oz (79.6 kg)     GEN:  Well nourished, well developed in no acute distress HEENT: Normal NECK: No JVD; No carotid bruits LYMPHATICS: No lymphadenopathy CARDIAC: RRR, soft systolic murmur at the left lower sternal border is present RESPIRATORY:  Clear to auscultation without rales, wheezing or rhonchi  ABDOMEN: Soft, non-tender, non-distended MUSCULOSKELETAL:  No edema; No deformity  SKIN: Warm and dry NEUROLOGIC:  Alert and oriented x 3 PSYCHIATRIC:  Normal affect   ASSESSMENT:    1. ASD (atrial septal defect)   2. Acute ischemic stroke ([place])    PLAN:    In order of problems listed above:  1. I have personally reviewed the patient's hospital records, with specific attention to neurology notes.  I have reviewed the images from her transesophageal echo study.  This patient has a history of probable ASD repair.  It is not clear if she had a secundum ASD or another type of defect.  We will attempt to obtain her records from [facility], but I suspect this will be challenging since her surgery was almost 30 years ago.  I think a gated cardiac CTA will be helpful to evaluate her for any residual defect or pulmonary venous anomaly.  In reviewing her TEE, I do not appreciate any clear evidence of a PFO.  A vigorous bubble study is done and while there are a few bubbles in the left heart, I cannot appreciate any bubbles crossing the interatrial septum.  There certainly are no bubbles crossing through a tunnel where the PFO should be located either in the anterior or superior aspects of the interatrial  septum.  In addition, the patient has had a normal transcranial Doppler study suggestive of no intracardiac right to left shunting.  We will further assess for any cardiac anomalies on her  gated cardiac CTA, but I feel confident there is not a clinically significant PFO present that would be amenable to transcatheter closure. 2. The patient had a recent stroke that requires further evaluation.  She did undergo extensive hospital evaluation with imaging studies outlined above.  With her history of heart surgery and the continued presence of right atrial dilatation and tricuspid regurgitation, I think the patient is at increased risk of atrial fibrillation.  While this is not been detected, I think monitoring should be performed.  I have recommended consideration of an implantable loop recorder.  I will refer the patient to EP for further consideration.  We discussed the pros and cons of 30-day event monitoring versus implantation of an implantable loop recorder.  In this patient who suffered a stroke, I would favor the higher yield of long-term monitoring with a loop recorder.    Shared Decision Making/Informed Consent      Medication Adjustments/Labs and Tests Ordered: Current medicines are reviewed at length with the patient today.  Concerns regarding medicines are outlined above.  Orders Placed This Encounter  Procedures  . CT CORONARY MORPH W/CTA COR W/SCORE W/CA W/CM &/OR WO/CM  . CT CORONARY FRACTIONAL FLOW RESERVE DATA PREP  . CT CORONARY FRACTIONAL FLOW RESERVE FLUID ANALYSIS  . Basic metabolic panel  . Ambulatory referral to Cardiac Electrophysiology  . EKG 12-Lead   Meds ordered this encounter  Medications  . metoprolol tartrate (LOPRESSOR) 100 MG tablet    Sig: Take 1 tablet (100 mg total) by mouth once for 1 dose. Take 2 hours prior to your cardiac CT appointment.    Dispense:  1 tablet    Refill:  0    Patient Instructions  Medication Instructions:  Your provider recommends  that you continue on your current medications as directed. Please refer to the Current Medication list given to you today.   *If you need a refill on your cardiac medications before your next appointment, please call your pharmacy*  Lab Work: TODAY! BMET If you have labs (blood work) drawn today and your tests are completely normal, you will receive your results only by: Marland Kitchen MyChart Message (if you have MyChart) OR . A paper copy in the mail If you have any lab test that is abnormal or we need to change your treatment, we will call you to review the results.  Testing/Procedures: Dr. Burt Knack recommends you have a CARDIAC CT.  Dr. Burt Knack recommends you have a LOOP RECORDER placed. You will be called to arrange an appointment with an EP specialist.  Follow-up: You will be called to arrange follow-up after your testing is complete.  CARDIAC CT INSTRUCTIONS: Your cardiac CT will be scheduled at one of the below locations:   [facility] [address] [place], [place] [address] [phone] [place] [address] [place], [place] [address] [phone]  If scheduled at [facility], please arrive at the [facility] main entrance of [facility] 30 minutes prior to test start time. Proceed to the [facility] Radiology Department (first floor) to check-in and test prep.  If scheduled at [facility], please arrive 15 mins early for check-in and test prep.  Please follow these instructions carefully (unless otherwise directed):  On the Night Before the Test: . Be sure to Drink plenty of water. . Do not consume any caffeinated/decaffeinated beverages or chocolate 12 hours prior to your test. . Do not take  any antihistamines 12 hours prior to your test.  On the Day of the Test: . Drink plenty of water. Do not drink any water within one hour of the test. . Do not eat any food 4  hours prior to the test. . You may take your regular medications prior to the test.  . Take metoprolol (Lopressor) 100 mg two hours prior to test. . FEMALES- please wear underwire-free bra if available      After the Test: . Drink plenty of water. . After receiving IV contrast, you may experience a mild flushed feeling. This is normal. . On occasion, you may experience a mild rash up to 24 hours after the test. This is not dangerous. If this occurs, you can take Benadryl 25 mg and increase your fluid intake. . If you experience trouble breathing, this can be serious. If it is severe call 911 IMMEDIATELY. If it is mild, please call our office.   Once we have confirmed authorization from your insurance company, we will call you to set up a date and time for your test. Based on how quickly your insurance processes prior authorizations requests, please allow up to 4 weeks to be contacted for scheduling your Cardiac CT appointment. Be advised that routine Cardiac CT appointments could be scheduled as many as 8 weeks after your provider has ordered it.  For non-scheduling related questions, please contact the cardiac imaging nurse navigator should you have any questions/concerns: Marchia Bond, Cardiac Imaging Nurse Navigator Burley Saver, Interim Cardiac Imaging Nurse [place] [facility] Direct Office Dial: [phone]   For scheduling needs, including cancellations and rescheduling, please call Vivien Rota at [phone], option 3.     Signed, Sherren Mocha, MD  05/13/2020 2:20 PM    [place]

## 2020-05-13 ENCOUNTER — Encounter: Payer: Self-pay | Admitting: Cardiovascular Disease

## 2020-05-13 ENCOUNTER — Telehealth: Payer: Self-pay | Admitting: Cardiovascular Disease

## 2020-05-13 LAB — BASIC METABOLIC PANEL
BUN/Creatinine Ratio: 13 (ref 9–23)
BUN: 10 mg/dL (ref 6–24)
CO2: 24 mmol/L (ref 20–29)
Calcium: 9.2 mg/dL (ref 8.7–10.2)
Chloride: 102 mmol/L (ref 96–106)
Creatinine, Ser: 0.77 mg/dL (ref 0.57–1.00)
GFR calc Af Amer: 106 mL/min/{1.73_m2} (ref >59)
GFR calc non Af Amer: 92 mL/min/{1.73_m2} (ref >59)
Glucose: 123 mg/dL — ABNORMAL HIGH (ref 65–99)
Potassium: 3.9 mmol/L (ref 3.5–5.2)
Sodium: 138 mmol/L (ref 134–144)

## 2020-05-13 MED ORDER — METOPROLOL TARTRATE 100 MG PO TABS: 100.0000 mg | ORAL_TABLET | Freq: Once | ORAL | 0 refills | Status: DC

## 2020-05-13 NOTE — Telephone Encounter (Signed)
Letter released via MyChart and faxed to [phone] per patient request.

## 2020-05-13 NOTE — Telephone Encounter (Signed)
New message:     Patient was seen in the office on yesterday patient need a note for work.

## 2020-05-19 ENCOUNTER — Ambulatory Visit (HOSPITAL_COMMUNITY): Payer: BC Managed Care – PPO

## 2020-05-20 ENCOUNTER — Inpatient Hospital Stay: Payer: BC Managed Care – PPO | Admitting: Neurology

## 2020-05-20 ENCOUNTER — Encounter: Payer: Self-pay | Admitting: Adult Health

## 2020-05-20 ENCOUNTER — Ambulatory Visit (INDEPENDENT_AMBULATORY_CARE_PROVIDER_SITE_OTHER): Payer: BC Managed Care – PPO | Admitting: Cardiology

## 2020-05-20 ENCOUNTER — Encounter: Payer: Self-pay | Admitting: Cardiology

## 2020-05-20 ENCOUNTER — Telehealth: Payer: Self-pay | Admitting: Adult Health

## 2020-05-20 ENCOUNTER — Ambulatory Visit (INDEPENDENT_AMBULATORY_CARE_PROVIDER_SITE_OTHER): Payer: BC Managed Care – PPO | Admitting: Adult Health

## 2020-05-20 ENCOUNTER — Other Ambulatory Visit: Payer: Self-pay

## 2020-05-20 VITALS — BP 114/70 | HR 75 | Ht 65.0 in | Wt 198.0 lb

## 2020-05-20 VITALS — BP 102/65 | HR 74 | Ht 65.0 in | Wt 197.0 lb

## 2020-05-20 DIAGNOSIS — Q211 Atrial septal defect, unspecified: Secondary | ICD-10-CM

## 2020-05-20 DIAGNOSIS — R519 Headache, unspecified: Secondary | ICD-10-CM

## 2020-05-20 DIAGNOSIS — Z8639 Personal history of other endocrine, nutritional and metabolic disease: Secondary | ICD-10-CM | POA: Diagnosis not present

## 2020-05-20 DIAGNOSIS — E785 Hyperlipidemia, unspecified: Secondary | ICD-10-CM

## 2020-05-20 DIAGNOSIS — I639 Cerebral infarction, unspecified: Secondary | ICD-10-CM

## 2020-05-20 DIAGNOSIS — R4789 Other speech disturbances: Secondary | ICD-10-CM

## 2020-05-20 MED ORDER — NURTEC 75 MG PO TBDP: 75.0000 mg | ORAL_TABLET | Freq: Every day | ORAL | 0 refills | Status: DC | PRN

## 2020-05-20 NOTE — Telephone Encounter (Signed)
Patient returned my call she is scheduled at [facility] for 05/27/20. 

## 2020-05-20 NOTE — Progress Notes (Signed)
[facility] [address] [place]. [place] [address] [phone]       HOSPITAL FOLLOW UP NOTE  Ms. Sanjuanita Nelda Marseille Date of Birth:  11-Jan-1971 Medical Record Number:  [id number]   Reason for Referral:  hospital stroke follow up    SUBJECTIVE:   CHIEF COMPLAINT:  Chief Complaint  Patient presents with  . 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  Personally reviewed hospitalization pertinent progress notes, lab work and imaging with summary provided.  Evaluated by Dr. Roda Shutters with stroke work-up revealing right parietal occipital region and left frontal lobe small infarcts s/p tPA, infarcts embolic secondary to unknown source concerning for ASD/PFO related as TEE positive for PFO.  Refer to Dr. Excell Seltzer for outpatient evaluation of PFO.  Hypercoagulable and autoimmune labs pending at discharge.  Recommended DAPT until evaluation with Dr. Excell Seltzer outpatient.  LDL 94 and initiate atorvastatin 20 mg daily. No hx of HTN or DM.  History of Hashimoto thyroiditis not on Synthroid PTA per husband with TSH 6.13 (but FT4 normal), thyroid peroxidase Ab 55 and thyroglobulin Ab 1.5 and advised follow-up with PCP outpatient for further management.  Her stroke risk factors include hx of ASD closure at age 42 and obesity.  No prior stroke history.  Evaluated by therapies and discharged home in stable condition without therapy needs on 03/14/2020.  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 Pearlean Brownie for second opinion  SCDs for VTE prophylaxis  No antithromboticprior to admission, now on ASA 81 and plavix 75 DAPT for stroke prevention. Continue DAPT until sees Dr. Excell Seltzer as outpt.  Patient counseled to be compliant withherantithrombotic medications  Ongoing aggressive stroke risk factor management  Therapy recommendations: None  Disposition: Discharge to home   Today, 05/20/2020, Ms. Kunde is being seen for hospital follow-up accompanied by her husband.  She reports initially doing well at discharge but over the past month (approx 1 month post discharge), she has been experiencing word finding difficulties as well as frequent bilateral temporal and occipital headaches.  She does have history of migraines but has not experienced a migraine "in many years".  Headache consistent with throbbing sensation but denies photophobia, phonophobia or nausea/vomiting.  Can occur up to 2-3 times monthly and may last for 3 to 4 days.  She will take Tylenol with some benefit.  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  She has remained on both aspirin and Plavix without bleeding or bruising as well as atorvastatin 10 mg daily without myalgias.  Further review of hypercoagulable and autoimmune labs pending at discharge unremarkable.  She was evaluated by Dr. Excell Seltzerooper on 05/12/2020 and personally reviewed the office note. Per Dr. Excell Seltzerooper, he reviewed TEE and I did not appreciate any clear evidence of PFO and TCD negative therefore recommended pursuing cardiac CTA to further assess for cardiac anomalies which  is scheduled for this Friday.  He believes increased risk of atrial fibrillation with history of prior heart surgery and continued presence of right atrial dilation and tricuspid regurgitation and referred to EP for further consideration of loop recorder.  She has appointment scheduled today with Dr. Lalla BrothersLambert for further discussion of loop recorder.  No further concerns at this time.     ROS:   14 system review of systems performed and negative with exception of those listed in HPI  PMH:  Past Medical History:  Diagnosis Date  . No diagnosis     PSH:  Past Surgical History:  Procedure Laterality Date  . BUBBLE STUDY  03/13/2020   Procedure: BUBBLE STUDY;  Surgeon: Quintella Reicherturner, Traci R, MD;  [facility];  Service: Cardiovascular;;  . ENDOMETRIAL ABLATION     Uterine/heavy menses  . SHOULDER SURGERY     Left,/bone spurs  . TEE WITHOUT CARDIOVERSION N/A 03/13/2020   Procedure: TRANSESOPHAGEAL ECHOCARDIOGRAM (TEE);  Surgeon: Quintella Reicherturner, Traci R, MD;  [facility];  Service: Cardiovascular;  Laterality: N/A;    Social History:  Social History   Socioeconomic History  . Marital status: Married    Spouse name: Not on file  . Number of children: Not on file  . Years of education: Not on file  . Highest education level: Not on file  Occupational History  . Not on file  Tobacco Use  . Smoking status: Former Games developermoker  . Smokeless tobacco: Never Used  Substance and Sexual Activity  . Alcohol use: Yes    Alcohol/week: 1.0 standard drink    Types: 1 Cans of beer per week    Comment: states 1 beer every other day or so   . Drug use: Not on file  . Sexual activity: Not on file  Other Topics Concern  . Not on file  Social History Narrative   Last Mamm: 04.07.10 @ [facility] BI-RADS Category 1: Negative^MM DIGITAL DG BILATERAL   Social Determinants of Health   Financial Resource Strain:   . Difficulty of Paying Living Expenses: Not on file  Food Insecurity:   . Worried About  Programme researcher, broadcasting/film/videounning Out of Food in the Last Year: Not on file  . Ran Out of Food in the Last Year: Not on file  Transportation Needs: No Transportation Needs  . Lack of Transportation (Medical): No  . Lack of Transportation (Non-Medical): No  Physical Activity:   . Days of Exercise per Week: Not on file  . Minutes of Exercise per Session: Not on file  Stress:   . Feeling of Stress : Not on file  Social Connections:   . Frequency of Communication with Friends and Family: Not on file  . Frequency of Social Gatherings with Friends and Family: Not on file  . Attends Religious Services: Not on file  . Active Member of Clubs or Organizations: Not on file  . Attends BankerClub or Organization Meetings: Not on file  . Marital Status: Not on file  Intimate Partner Violence:   . Fear of Current or Ex-Partner: Not on file  . Emotionally Abused: Not on file  . Physically Abused: Not on file  . Sexually Abused: Not on file    Family History: No family  history on file.  Medications:   Current Outpatient Medications on File Prior to Visit  Medication Sig Dispense Refill  . aspirin EC 81 MG EC tablet Take 1 tablet (81 mg total) by mouth daily. Swallow whole. 30 tablet 11  . atorvastatin (LIPITOR) 20 MG tablet Take 1 tablet (20 mg total) by mouth daily. 30 tablet 2  . metoprolol tartrate (LOPRESSOR) 100 MG tablet Take 1 tablet (100 mg total) by mouth once for 1 dose. Take 2 hours prior to your cardiac CT appointment. 1 tablet 0   No current facility-administered medications on file prior to visit.    Allergies:  No Known Allergies    OBJECTIVE:  Physical Exam  Vitals:   05/20/20 0803  BP: 102/65  Pulse: 74  Weight: 197 lb (89.4 kg)  Height: 5\' 5"  (1.651 m)   Body mass index is 32.78 kg/m. No exam data present  Post stroke PHQ 2/9 Depression screen PHQ 2/9 05/20/2020  Decreased Interest 0  Down, Depressed, Hopeless 0  PHQ - 2 Score 0     General: well developed, well nourished, pleasant  middle-age Caucasian female, seated, in no evident distress Head: head normocephalic and atraumatic.   Neck: supple with no carotid or supraclavicular bruits Cardiovascular: regular rate and rhythm, no murmurs Musculoskeletal: no deformity Skin:  no rash/petichiae Vascular:  Normal pulses all extremities   Neurologic Exam Mental Status: Awake and fully alert.  Occasional speech hesitancy but no clear evidence of dysarthria or aphasia.  Oriented to place and time. Recent and remote memory intact. Attention span, concentration and fund of knowledge appropriate. Mood and affect appropriate.  Cranial Nerves: Fundoscopic exam reveals sharp disc margins. Pupils equal, briskly reactive to light. Extraocular movements full without nystagmus. Visual fields full to confrontation. Hearing intact. Facial sensation intact.  Left lower facial weakness (not previously reported).  Tongue, palate moves normally and symmetrically.  Motor: Normal bulk and tone. Normal strength in all tested extremity muscles. Sensory.: intact to touch , pinprick , position and vibratory sensation.  Coordination: Rapid alternating movements normal in all extremities. Finger-to-nose and heel-to-shin performed accurately bilaterally. Gait and Station: Arises from chair without difficulty. Stance is normal. Gait demonstrates normal stride length and balance Reflexes: 1+ and symmetric. Toes downgoing.     NIHSS  1 Modified Rankin  1      ASSESSMENT: ODETH BRY is a 49 y.o. year old female presented with right arm and leg weakness on 03/11/2020 with stroke work-up revealing right parieto-occipital region and left frontal temporal lobe small infarcts, s/p tPA, infarcts embolic secondary to unknown source concerning for ASD/PFO related. Vascular risk factors include hx of ASD closure at age 59, Hashimoto thyroiditis, HLD and obesity.  Initially recovered well without residual deficits but over the past month, reports new onset word  finding difficulty and frequent headaches     PLAN:  1. Cryptogenic stroke: a. Hypercoagulable and autoimmune labs unremarkable b. Per Dr. 12, unable to appreciate clear evidence of PFO on TEE after further review (see HPI) and plans on cardiac CTA this Friday.  Dr. Wednesday recommended further evaluation for possible atrial fibrillation with placement of loop recorder -as initial evaluation with EP Dr. Excell Seltzer today for further discussion.  c.  continue aspirin 81 mg daily  and atorvastatin for secondary stroke prevention.  Advised to discontinue Plavix at this time as no indication for continued DAPT d. Discussed secondary stroke prevention measures and importance of close PCP follow up for aggressive stroke risk factor  management  2. Word finding difficulties and L facial droop: a. Not previously documented and denies previously experiencing the symptoms b. Discussed possible late effect of prior stroke but also important to rule out additional strokes or underlying abnormality therefore will obtain MRI brain 3. New onset headaches:  a. Obtain MRI brain w/wo contrast to rule out possible new stroke or underlying abnormality b. Recommend trial Nurtec 75 mg daily as needed for emergent migraine relief. Max dose 75 mg/24 hours. c. Prophylactic headache management not currently indicated 4. Hashimoto thyroiditis: Referral placed to endocrinology for further evaluation as well as monitoring and management 5. HLD: LDL goal <70. Recent LDL 94.  Placed on atorvastatin during stroke admission. Will repeat lipid panel today to ensure optimal management.  Request ongoing prescribing and monitoring by PCP     Follow up in 4 months or call earlier if needed   CC:  [facility] provider: Dr. Margaretmary Dys, Audrie Gallus, MD    I spent 50 minutes of face-to-face and non-face-to-face time with patient and husband.  This included previsit chart review including recent hospitalization pertinent progress notes, lab  work and imaging, lab review, study review, order entry, electronic health record documentation, patient education and discussion regarding recent stroke and further etiology evaluation, new onset deficits and headaches, importance of managing stroke risk factors and answered all questions to patient and husband's satisfaction    Ihor Austin, AGNP-BC  [facility] [address] [place], [place] [address]  Phone [phone] Fax [phone] Note: This document was prepared with digital dictation and possible smart phrase technology. Any transcriptional errors that result from this process are unintentional.

## 2020-05-20 NOTE — Patient Instructions (Addendum)
Sample Nurtec 75mg  daily as needed for acute headache management -if beneficial, please call office and a order will be placed. Max dose 75mg /24 hrs  We will repeat MRI of your brain due to speech difficulty and new onset headaches to rule out any additional strokes or abnormalities   Check cholesterol levels today to ensure improvement since starting on statin  Continue aspirin 81 mg daily  and atorvastatin  for secondary stroke prevention  You can stop the plavix at this time as no indication for continuation   Continue to follow up with PCP regarding cholesterol  management  Maintain strict control of hypertension with blood pressure goal below 130/90 and cholesterol with LDL cholesterol (bad cholesterol) goal below 70 mg/dL.       Followup in the future with me in 4 months or call earlier if needed       Thank you for coming to see at [facility]. I hope we have been able to provide you high quality care today.  You may receive a patient satisfaction survey over the next few weeks. We would appreciate your feedback and comments so that we may continue to improve ourselves and the health of our patients.

## 2020-05-20 NOTE — Patient Instructions (Addendum)
Medication Instructions:  Your physician recommends that you continue on your current medications as directed. Please refer to the Current Medication list given to you today.  Labwork: None ordered.  Testing/Procedures: None ordered.  Follow-Up:  Your physician wants you to follow-up in: as needed with Dr. Lambert.     Implantable Loop Recorder Placement, Care After This sheet gives you information about how to care for yourself after your procedure. Your health care provider may also give you more specific instructions. If you have problems or questions, contact your health care provider. What can I expect after the procedure? After the procedure, it is common to have:  Soreness or discomfort near the incision.  Some swelling or bruising near the incision.  Follow these instructions at home: Incision care  1.  Leave your outer dressing on for 72 hours.  After 72 hours you can remove your outer dressing and shower. 2. Leave adhesive strips in place. These skin closures may need to stay in place for 1-2 weeks. If adhesive strip edges start to loosen and curl up, you may trim the loose edges.  You may remove the strips if they have not fallen off after 2 weeks. 3. Check your incision area every day for signs of infection. Check for: a. Redness, swelling, or pain. b. Fluid or blood. c. Warmth. d. Pus or a bad smell. 4. Do not take baths, swim, or use a hot tub until your incision is completely healed. 5. If your wound site starts to bleed apply pressure.      If you have any questions/concerns please call the device clinic at [phone].  Activity  Return to your normal activities.  General instructions  Follow instructions from your health care provider about how to manage your implantable loop recorder and transmit the information. Learn how to activate a recording if this is necessary for your type of device.  Do not go through a metal detection gate, and do not let  someone hold a metal detector over your chest. Show your ID card.  Do not have an MRI unless you check with your health care provider first.  Take over-the-counter and prescription medicines only as told by your health care provider.  Keep all follow-up visits as told by your health care provider. This is important. Contact a health care provider if:  You have redness, swelling, or pain around your incision.  You have a fever.  You have pain that is not relieved by your pain medicine.  You have triggered your device because of fainting (syncope) or because of a heartbeat that feels like it is racing, slow, fluttering, or skipping (palpitations). Get help right away if you have:  Chest pain.  Difficulty breathing. Summary  After the procedure, it is common to have soreness or discomfort near the incision.  Change your dressing as told by your health care provider.  Follow instructions from your health care provider about how to manage your implantable loop recorder and transmit the information.  Keep all follow-up visits as told by your health care provider. This is important. This information is not intended to replace advice given to you by your health care provider. Make sure you discuss any questions you have with your health care provider. Document Released: 06/02/2015 Document Revised: 08/06/2017 Document Reviewed: 08/06/2017 Elsevier Patient Education  2020 [employer]   

## 2020-05-20 NOTE — Progress Notes (Signed)
Electrophysiology Office Note:    Date:  05/20/2020   ID:  [name], DOB 06/10/1971, MRN [id number]  PCP:  Judy Pimple, MD  [facility] Cardiologist:  Armanda Magic, MD  [facility] Electrophysiologist:  None   Referring MD: Tonny Bollman, MD   Chief Complaint: ASD  History of Present Illness:    [name] is a 49 y.o. female who presents for an evaluation of ASD and stroke at the request of Dr. Excell Seltzer. 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  Likely this surgery was an ASD repair.  She was brought in by EMS with right-sided weakness involving the arm and leg.  Acute imaging revealed right parieto-occipital and left frontal lobe infarcts with an embolic pattern.  She was treated with TPA.  TEE was performed demonstrating a small interatrial level shunt. [name] most recently saw Dr. Excell Seltzer on November 8.  Given the unclear etiology of her stroke, Dr. Excell Seltzer recommended ongoing monitoring with an implantable loop recorder.  She presents today to have the loop recorder implanted.   Past Medical History:  Diagnosis Date  . No diagnosis     Past Surgical History:  Procedure Laterality Date  . BUBBLE STUDY  03/13/2020   Procedure: BUBBLE STUDY;  Surgeon: Quintella Reichert, MD;  Location: [facility] ENDOSCOPY;  Service: Cardiovascular;;  . ENDOMETRIAL ABLATION     Uterine/heavy menses  . SHOULDER SURGERY     Left,/bone spurs  . TEE WITHOUT CARDIOVERSION N/A 03/13/2020   Procedure: TRANSESOPHAGEAL ECHOCARDIOGRAM (TEE);  Surgeon: Quintella Reichert, MD;  Location: [facility] ENDOSCOPY;  Service: Cardiovascular;  Laterality: N/A;    Current Medications: Current Meds  Medication Sig  . aspirin EC 81 MG EC tablet Take 1 tablet (81 mg total) by mouth daily. Swallow whole.  Marland Kitchen atorvastatin (LIPITOR) 20 MG tablet Take 1 tablet (20 mg total) by mouth daily.  . metoprolol tartrate (LOPRESSOR) 100 MG tablet  Take 1 tablet (100 mg total) by mouth once for 1 dose. Take 2 hours prior to your cardiac CT appointment.  . Rimegepant Sulfate (NURTEC) 75 MG TBDP Take 75 mg by mouth daily as needed (Acute migraine management).     Allergies:   Patient has no known allergies.   Social History   Socioeconomic History  . Marital status: Married    Spouse name: Not on file  . Number of children: Not on file  . Years of education: Not on file  . Highest education level: Not on file  Occupational History  . Not on file  Tobacco Use  . Smoking status: Former Games developer  . Smokeless tobacco: Never Used  Substance and Sexual Activity  . Alcohol use: Yes    Alcohol/week: 1.0 standard drink    Types: 1 Cans of beer per week    Comment: states 1 beer every other day or so   . Drug use: Not on file  . Sexual activity: Not on file  Other Topics Concern  . Not on file  Social History Narrative   Last Mamm: 04.07.10 @ [facility] BI-RADS Category 1: Negative^MM DIGITAL DG BILATERAL   Social Determinants of Health   Financial Resource Strain:   . Difficulty of Paying Living Expenses: Not on file  Food Insecurity:   . Worried About Programme researcher, broadcasting/film/video in the Last Year: Not on file  . Ran Out of Food in the Last Year:  Not on file  Transportation Needs: No Transportation Needs  . Lack of Transportation (Medical): No  . Lack of Transportation (Non-Medical): No  Physical Activity:   . Days of Exercise per Week: Not on file  . Minutes of Exercise per Session: Not on file  Stress:   . Feeling of Stress : Not on file  Social Connections:   . Frequency of Communication with Friends and Family: Not on file  . Frequency of Social Gatherings with Friends and Family: Not on file  . Attends Religious Services: Not on file  . Active Member of Clubs or Organizations: Not on file  . Attends Banker Meetings: Not on file  . Marital Status: Not on file     Family History: The patient's family history is not  on file.  ROS:   Please see the history of present illness.    All other systems reviewed and are negative.  EKGs/Labs/Other Studies Reviewed:    The following studies were reviewed today: Prior notes, echo  March 13, 2020 echo/TEE personally reviewed Left ventricular function normal, 55% Right ventricular function normal No left atrial or left atrial appendage thrombus detected Trivial MR Moderate tricuspid regurgitation Bubble study positive within 3-6 cardiac cycles    Recent Labs: 03/11/2020: ALT 20 03/12/2020: TSH 6.130 03/14/2020: Hemoglobin 12.8; Platelets 219 05/12/2020: BUN 10; Creatinine, Ser 0.77; Potassium 3.9; Sodium 138  Recent Lipid Panel    Component Value Date/Time   CHOL 172 03/12/2020 0612   TRIG 150 (H) 03/12/2020 0612   HDL 48 03/12/2020 0612   CHOLHDL 3.6 03/12/2020 0612   VLDL 30 03/12/2020 0612   LDLCALC 94 03/12/2020 0612    Physical Exam:    VS:  BP 114/70   Pulse 75   Ht 5\' 5"  (1.651 m)   Wt 198 lb (89.8 kg)   SpO2 99%   BMI 32.95 kg/m     Wt Readings from Last 3 Encounters:  05/20/20 198 lb (89.8 kg)  05/20/20 197 lb (89.4 kg)  05/12/20 198 lb (89.8 kg)     GEN:  Well nourished, well developed in no acute distress HEENT: Normal NECK: No JVD; No carotid bruits LYMPHATICS: No lymphadenopathy CARDIAC: RRR, no murmurs, rubs, gallops RESPIRATORY:  Clear to auscultation without rales, wheezing or rhonchi  ABDOMEN: Soft, non-tender, non-distended MUSCULOSKELETAL:  No edema; No deformity  SKIN: Warm and dry NEUROLOGIC:  Alert and oriented x 3 PSYCHIATRIC:  Normal affect   ASSESSMENT:    1. Acute ischemic stroke (HCC)   2. ASD (atrial septal defect)    PLAN:    In order of problems listed above:  1. History of acute stroke Recent acute stroke without obvious etiology.  Patient with a history of cardiac surgery likely to repair and ASD as a child.  Review of transesophageal echo images does not clearly demonstrate a PFO  although the bubble study is positive suggesting an intracardiac shunt.  She has a cardiac CT scheduled at the current time to further evaluate this.  She may require further imaging with a cardiac MRI to assess for other areas of shunting such as anomalous venous drainage if the CT is unrevealing. I do think is very reasonable to implant a loop recorder during today's visit to assess for subclinical atrial fibrillation as a potential contributor to her ischemic stroke.  I discussed the procedure in depth with the patient today including the risks and she is agreeable to proceed.  2.  History of ASD status  post repair at [facility] as a child TEE does not show obvious ASD but bubble study is positive.  She has a CT ordered to further assess.      Medication Adjustments/Labs and Tests Ordered: Current medicines are reviewed at length with the patient today.  Concerns regarding medicines are outlined above.  No orders of the defined types were placed in this encounter.  No orders of the defined types were placed in this encounter.    Signed, Steffanie Dunn, MD, [facility]  05/20/2020 2:45 PM    Electrophysiology [facility]       ----------------------------------------------------------------------------- SURGEON:  Steffanie Dunn, MD    PREPROCEDURE DIAGNOSIS: Ischemic stroke    POSTPROCEDURE DIAGNOSIS: Ischemic stroke     PROCEDURES:   1. Implantable loop recorder implantation    INTRODUCTION:  [name] is a 49 y.o. female with a history of ischemic stroke who presents today for implantable loop implantation.  She has worn telemetry previously during which she did not have arrhythmias.  There is significant concern for possible atrial fibrillation as the cause for the embolic stroke.   DESCRIPTION OF PROCEDURE:  Informed written consent was obtained.  The patient required no sedation for the procedure today.  Mapping over the patient's chest was  performed to identify the area where electrograms were most prominent for ILR recording.  This area was found to be the left parasternal region over the 3rd-4th intercostal space. The patients left chest was therefore prepped and draped in the usual sterile fashion. The skin overlying the left parasternal region was infiltrated with lidocaine for local analgesia.  A 0.5-cm incision was made over the left parasternal region over the 3rd intercostal space.  A subcutaneous ILR pocket was fashioned using a combination of sharp and blunt dissection.  A Medtronic Reveal Linq model [id number] (model [id number]) implantable loop recorder was then placed into the pocket  R waves were very prominent and measured >0.93mV.  Steri- Strips and a sterile dressing were then applied.  There were no early apparent complications.     CONCLUSIONS:   1. Successful implantation of a Medtronic Reveal LINQ implantable loop recorder for monitoring for subclinical atrial fibrillation the setting of a recent acute stroke  2. No early apparent complications.   Steffanie Dunn, MD 05/20/2020 2:45 PM

## 2020-05-20 NOTE — Telephone Encounter (Signed)
unable to leave vmail the mail box is full  BCBS auth: [id number] (exp. 05/20/20 to 11/15/20)

## 2020-05-21 ENCOUNTER — Telehealth: Payer: Self-pay | Admitting: Emergency Medicine

## 2020-05-21 ENCOUNTER — Telehealth: Payer: Self-pay

## 2020-05-21 ENCOUNTER — Ambulatory Visit (INDEPENDENT_AMBULATORY_CARE_PROVIDER_SITE_OTHER): Payer: BC Managed Care – PPO | Admitting: Emergency Medicine

## 2020-05-21 DIAGNOSIS — I639 Cerebral infarction, unspecified: Secondary | ICD-10-CM

## 2020-05-21 LAB — LIPID PANEL
Chol/HDL Ratio: 2.9 ratio (ref 0.0–4.4)
Cholesterol, Total: 160 mg/dL (ref 100–199)
HDL: 56 mg/dL (ref >39)
LDL Chol Calc (NIH): 80 mg/dL (ref 0–99)
Triglycerides: 141 mg/dL (ref 0–149)
VLDL Cholesterol Cal: 24 mg/dL (ref 5–40)

## 2020-05-21 NOTE — Telephone Encounter (Signed)
Notified patient that [facility] system shows her mobile # needs to be verified to start  monitoring remotes.

## 2020-05-21 NOTE — Telephone Encounter (Signed)
Patient reports intermittent bleeding from ILR insertion site since implant 05/20/20. Patient report small amount of blood oozing from edge of dressing. Patient reports she applied direct pressure for 20 minutes after speaking with the on-call provider last night. Patient to be seen  to evaluate to site

## 2020-05-21 NOTE — Telephone Encounter (Signed)
Patient called in stating her wound wont stop bleeding and its coming out the side of bandage and down side of her breast. Patient called on call doctor 05/20/2020 and they told her call DC in the morning

## 2020-05-21 NOTE — Progress Notes (Signed)
Outer dressing removed. Steri-strips in place with dried blood and minute amount of fresh blood on steri-strip. No active bleeding from wound site. Wound redressed with small pressure dressing and occlusive outer dressing. Patient to remove dressing in 24 hours.  And to apply direct pressure with 2 fingers over wound site for 10 minutes if bleeding resumes. Device clinic # provided for ay questions or concerns.

## 2020-05-22 ENCOUNTER — Telehealth (HOSPITAL_COMMUNITY): Payer: Self-pay | Admitting: Emergency Medicine

## 2020-05-22 NOTE — Telephone Encounter (Signed)
Reaching out to patient to offer assistance regarding upcoming cardiac imaging study; pt verbalizes understanding of appt date/time, parking situation and where to check in, pre-test NPO status and medications ordered, and verified current allergies; name and call back number provided for further questions should they arise Brigitta Pricer RN Navigator Cardiac Imaging [facility] [phone] office [phone] cell  100mg metoprolol 2 hr prior to scan 

## 2020-05-23 ENCOUNTER — Other Ambulatory Visit: Payer: Self-pay

## 2020-05-23 ENCOUNTER — Ambulatory Visit (HOSPITAL_COMMUNITY)
Admission: RE | Admit: 2020-05-23 | Discharge: 2020-05-23 | Disposition: A | Discharge status: Home / Self Care | Payer: BC Managed Care – PPO | Source: Ambulatory Visit | Attending: Cardiovascular Disease | Admitting: Cardiovascular Disease

## 2020-05-23 DIAGNOSIS — Q211 Atrial septal defect, unspecified: Secondary | ICD-10-CM

## 2020-05-23 DIAGNOSIS — I639 Cerebral infarction, unspecified: Secondary | ICD-10-CM

## 2020-05-23 DIAGNOSIS — I6389 Other cerebral infarction: Secondary | ICD-10-CM | POA: Diagnosis not present

## 2020-05-23 IMAGING — CT CT HEART MORP W/ CTA COR W/ SCORE W/ CA W/CM &/OR W/O CM
4 of 7 series · 9 of 20 positions shown, 10 images · IV contrast (APPLIED)
Comparison: None.
COMPARISON: None.

Addendum:
EXAM:
OVER-READ INTERPRETATION  CT CHEST

The following report is an over-read performed by radiologist Dr.
VAHIDREZA [REDACTED] on [DATE]. This
over-read does not include interpretation of cardiac or coronary
anatomy or pathology. The coronary calcium score/coronary CTA
interpretation by the cardiologist is attached.
CLINICAL DATA: 49-year-old female with h/o CVA (embolic right
parieto-occipital region and left frontal lobe infarcts) treated
with TPA in [DATE], no evidence for a PFO (by VAHIDREZA/bubble study on
[DATE]), h/o ASD repair in childhood (unknown type).
Cardiac/Coronary CTA
TECHNIQUE: The patient was scanned on a Phillips Force scanner.

[Series 6: best diast 71 % · axial · 0.39mm/px · z∈[-215,-149]mm · 3 of 328 slices shown, 4 images]
[im 82/328  vessel]
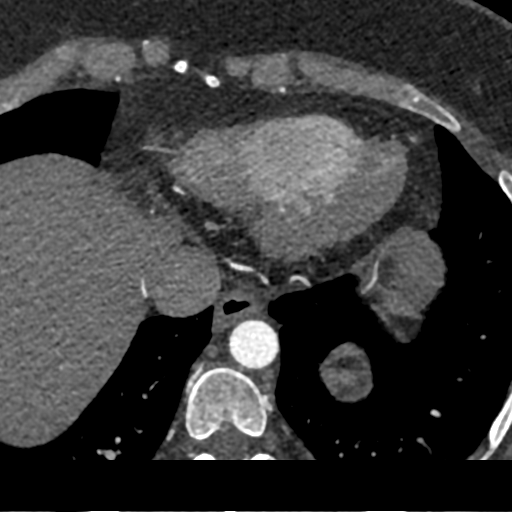
[im 82/328  lung]
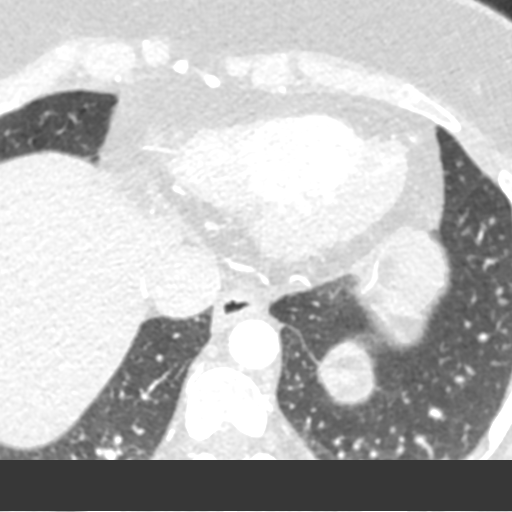
[im 164/328  vessel]
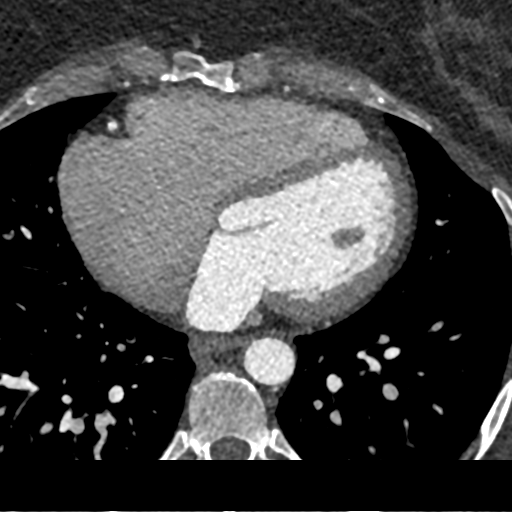
[im 246/328  vessel]
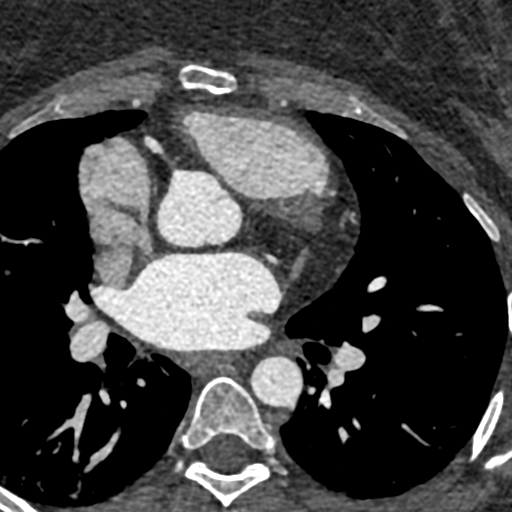

[Series 7: best syst 38 % · axial · 0.39mm/px · z∈[-190,-153]mm · 2 of 276 slices shown]
[im 92/276  vessel]
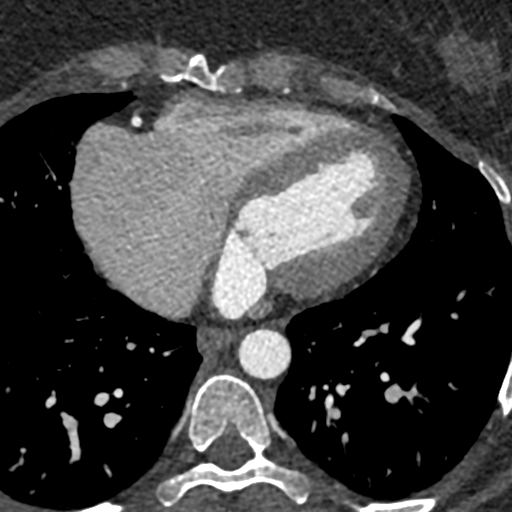
[im 184/276  vessel]
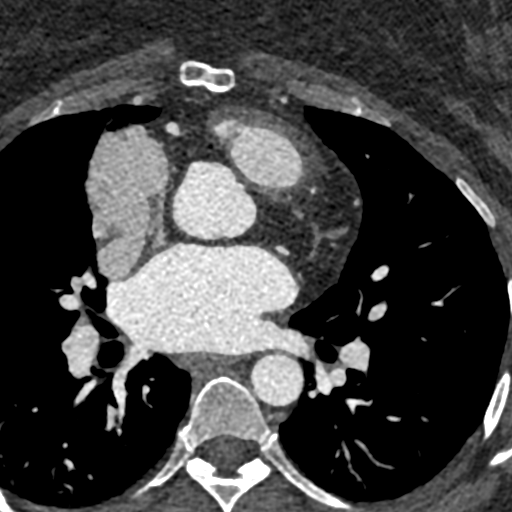

[Series 8: ts diast sharp 71 % · axial · 0.39mm/px · z∈[-190,-153]mm · 2 of 276 slices shown]
[im 92/276  lung]
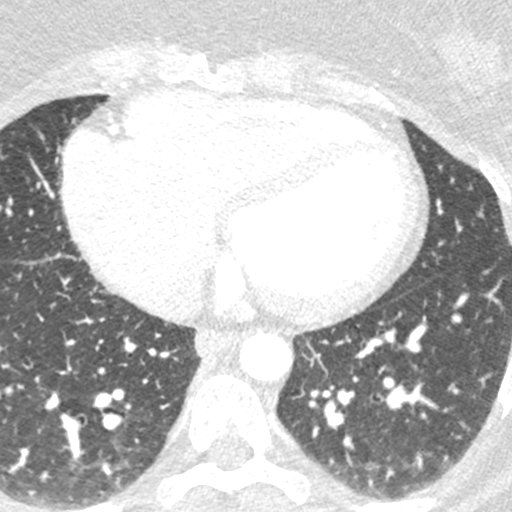
[im 184/276  lung]
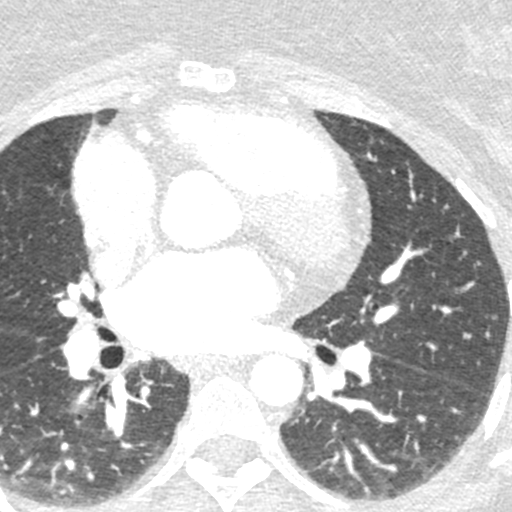

[Series 9: ts syst sharp 38 % · axial · 0.39mm/px · z∈[-190,-153]mm · 2 of 276 slices shown]
[im 92/276  lung]
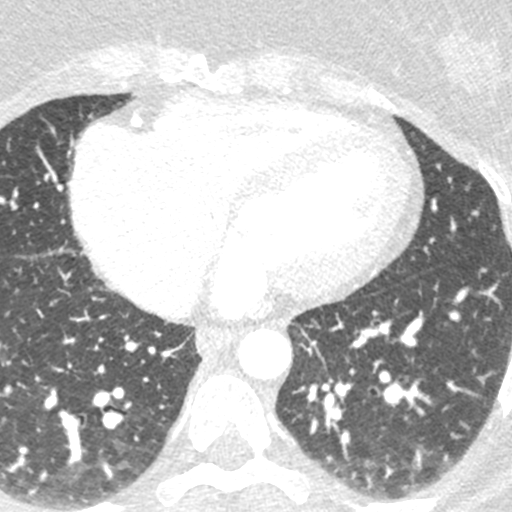
[im 184/276  lung]
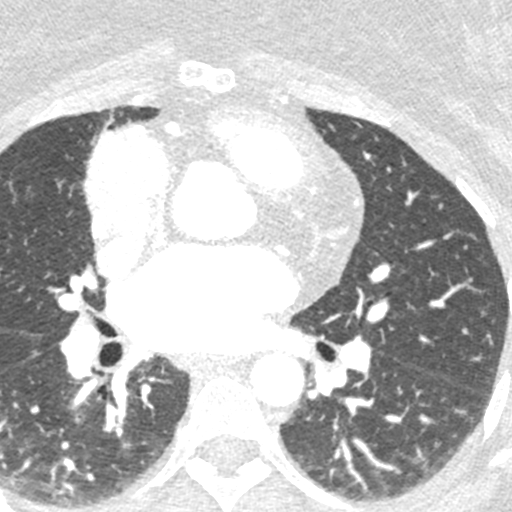

[9 of 20 positions shown; findings below may reference images not displayed]

FINDINGS: 4 mm right middle lobe pulmonary nodule (axial image 20 of series
12). Within the visualized portions of the thorax there are no other
larger more suspicious appearing pulmonary nodules or masses, there
is no acute consolidative airspace disease, no pleural effusions, no
pneumothorax and no lymphadenopathy. Visualized portions of the
upper abdomen are unremarkable. There are no aggressive appearing
lytic or blastic lesions noted in the visualized portions of the
skeleton. Multiple small metallic densities are noted in the
anterior mediastinum. Electronic device implanted in the medial
aspect of the left breast, presumably an implantable loop recorder.
IMPRESSION: 1. 4 mm right middle lobe pulmonary nodule, nonspecific, but
statistically likely benign. No follow-up needed if patient is
low-risk. Non-contrast chest CT can be considered in 12 months if
patient is high-risk. This recommendation follows the consensus
statement: Guidelines for Management of Incidental Pulmonary Nodules
Detected on CT Images: From the [HOSPITAL] [JT]; Radiology
FINDINGS: A 100 kV prospective scan was triggered in the descending thoracic
aorta at 111 HU's. Axial non-contrast 3 mm slices were carried out
through the heart. The data set was analyzed on a dedicated work
station and scored using the Agatson method. Gantry rotation speed
was 250 msecs and collimation was .6 mm. 100 mg of PO Metoprolol and
0.8 mg of sl NTG was given. The 3D data set was reconstructed in 5%
intervals of the 67-82 % of the R-R cycle. Diastolic phases were
analyzed on a dedicated work station using MPR, MIP and VRT modes.
The patient received 80 cc of contrast.

Aorta:  Normal size.  No calcifications.  No dissection.

Aortic Valve:  Trileaflet.  No calcifications.

Coronary Arteries:  Normal coronary origin.  Right dominance.

RCA is a very large dominant artery that gives rise to PDA and PLA.
There is no plaque.

Left main is a large artery that gives rise to LAD and LCX arteries.
Left main has no plaque.

LAD is a medium caliber vessel that gives rise to one small diagonal
artery and has no plaque.

LCX is a small caliber non-dominant artery that has no plaque.

Other findings:

Normal pulmonary vein drainage into the left atrium (3 on the right
and two on the left).

Normal left atrial appendage without a thrombus.

Normal size of the pulmonary artery.
IMPRESSION: 1. Coronary calcium score of 0. This was 0 percentile for age and
sex matched control.

2. Normal coronary origin with right dominance.

3. CAD-RADS 0. No evidence of CAD (0%). Consider non-atherosclerotic
causes of chest pain.

4. No evidence for PFO or residual ASD.

5. Normal pulmonary vein drainage into the left atrium (3 on the
right and 2 on the left).

6. No thrombus is seen in the left atrial appendage.

*** End of Addendum ***
EXAM:
OVER-READ INTERPRETATION  CT CHEST

The following report is an over-read performed by radiologist Dr.
VAHIDREZA [REDACTED] on [DATE]. This
over-read does not include interpretation of cardiac or coronary
anatomy or pathology. The coronary calcium score/coronary CTA
interpretation by the cardiologist is attached.
FINDINGS: 4 mm right middle lobe pulmonary nodule (axial image 20 of series
12). Within the visualized portions of the thorax there are no other
larger more suspicious appearing pulmonary nodules or masses, there
is no acute consolidative airspace disease, no pleural effusions, no
pneumothorax and no lymphadenopathy. Visualized portions of the
upper abdomen are unremarkable. There are no aggressive appearing
lytic or blastic lesions noted in the visualized portions of the
skeleton. Multiple small metallic densities are noted in the
anterior mediastinum. Electronic device implanted in the medial
aspect of the left breast, presumably an implantable loop recorder.
IMPRESSION: 1. 4 mm right middle lobe pulmonary nodule, nonspecific, but
statistically likely benign. No follow-up needed if patient is
low-risk. Non-contrast chest CT can be considered in 12 months if
patient is high-risk. This recommendation follows the consensus
statement: Guidelines for Management of Incidental Pulmonary Nodules
Detected on CT Images: From the [HOSPITAL] [JT]; Radiology

## 2020-05-23 MED ORDER — NITROGLYCERIN 0.4 MG SL SUBL
0.8000 mg | SUBLINGUAL_TABLET | Freq: Once | SUBLINGUAL | Status: AC
  Administered 2020-05-23: 0.8 mg via SUBLINGUAL

## 2020-05-23 MED ORDER — IOHEXOL 350 MG/ML SOLN
80.0000 mL | Freq: Once | INTRAVENOUS | Status: AC | PRN
  Administered 2020-05-23: 80 mL via INTRAVENOUS

## 2020-05-23 MED ORDER — NITROGLYCERIN 0.4 MG SL SUBL
SUBLINGUAL_TABLET | SUBLINGUAL | Status: AC
  Filled 2020-05-23: qty 2

## 2020-05-26 NOTE — Progress Notes (Signed)
I agree with the above plan 

## 2020-05-27 ENCOUNTER — Other Ambulatory Visit: Payer: Self-pay

## 2020-05-27 ENCOUNTER — Ambulatory Visit (INDEPENDENT_AMBULATORY_CARE_PROVIDER_SITE_OTHER): Payer: BC Managed Care – PPO

## 2020-05-27 ENCOUNTER — Telehealth: Payer: Self-pay | Admitting: Cardiology

## 2020-05-27 DIAGNOSIS — R4789 Other speech disturbances: Secondary | ICD-10-CM

## 2020-05-27 DIAGNOSIS — R519 Headache, unspecified: Secondary | ICD-10-CM

## 2020-05-27 MED ORDER — GADOBENATE DIMEGLUMINE 529 MG/ML IV SOLN
15.0000 mL | Freq: Once | INTRAVENOUS | Status: AC | PRN
  Administered 2020-05-27: 15 mL via INTRAVENOUS

## 2020-05-27 NOTE — Telephone Encounter (Signed)
Patient was just on the phone stating she is at [facility] to have a MRI of the Brain w/wo contrast.    Department was asking her is is ok to have the test since she just had loop done on 05/20/20.   Patient hung up before I could get a answer for her.

## 2020-05-27 NOTE — Telephone Encounter (Signed)
I told the pt the nurse will give her a call back. Her phone number is [phone].

## 2020-05-27 NOTE — Telephone Encounter (Signed)
I spoke with Dierdre Highman and she confirmed that the patient can have the MRI today.   I spoke with the patient and let her know that she can have the MRI today.

## 2020-06-02 ENCOUNTER — Telehealth: Payer: Self-pay

## 2020-06-02 NOTE — Telephone Encounter (Signed)
-----   Message from Ihor Austin, NP sent at 06/02/2020  4:11 PM EST ----- Please advise patient that recent imaging did not show any evidence of new stroke or abnormality.  Her symptoms may be related to late effect of stroke and should improve over time

## 2020-06-02 NOTE — Telephone Encounter (Signed)
Attempted to call the patient without success.  LM on the VM for the patient to call back re: recent results.  **If the patient calls back please relay the message below from Jessica McCue NP 

## 2020-06-03 ENCOUNTER — Telehealth: Payer: Self-pay | Admitting: Adult Health

## 2020-06-03 ENCOUNTER — Inpatient Hospital Stay (HOSPITAL_COMMUNITY)
Admission: EM | Admit: 2020-06-03 | Discharge: 2020-06-06 | DRG: 062 | Disposition: A | Discharge status: Home / Self Care | Payer: BC Managed Care – PPO | Attending: Neurology | Admitting: Neurology

## 2020-06-03 ENCOUNTER — Inpatient Hospital Stay (HOSPITAL_COMMUNITY): Payer: BC Managed Care – PPO

## 2020-06-03 ENCOUNTER — Encounter (HOSPITAL_COMMUNITY): Payer: Self-pay | Admitting: *Deleted

## 2020-06-03 ENCOUNTER — Encounter: Payer: Self-pay | Admitting: *Deleted

## 2020-06-03 ENCOUNTER — Emergency Department (HOSPITAL_COMMUNITY): Payer: BC Managed Care – PPO

## 2020-06-03 DIAGNOSIS — R202 Paresthesia of skin: Secondary | ICD-10-CM | POA: Diagnosis not present

## 2020-06-03 DIAGNOSIS — E669 Obesity, unspecified: Secondary | ICD-10-CM | POA: Diagnosis present

## 2020-06-03 DIAGNOSIS — Z8673 Personal history of transient ischemic attack (TIA), and cerebral infarction without residual deficits: Secondary | ICD-10-CM | POA: Diagnosis not present

## 2020-06-03 DIAGNOSIS — Z87891 Personal history of nicotine dependence: Secondary | ICD-10-CM | POA: Diagnosis not present

## 2020-06-03 DIAGNOSIS — R29810 Facial weakness: Secondary | ICD-10-CM | POA: Diagnosis not present

## 2020-06-03 DIAGNOSIS — Z9282 Status post administration of tPA (rtPA) in a different facility within the last 24 hours prior to admission to current facility: Secondary | ICD-10-CM | POA: Diagnosis not present

## 2020-06-03 DIAGNOSIS — Z20822 Contact with and (suspected) exposure to covid-19: Secondary | ICD-10-CM | POA: Diagnosis not present

## 2020-06-03 DIAGNOSIS — G43909 Migraine, unspecified, not intractable, without status migrainosus: Secondary | ICD-10-CM | POA: Diagnosis present

## 2020-06-03 DIAGNOSIS — I361 Nonrheumatic tricuspid (valve) insufficiency: Secondary | ICD-10-CM

## 2020-06-03 DIAGNOSIS — R930 Abnormal findings on diagnostic imaging of skull and head, not elsewhere classified: Secondary | ICD-10-CM | POA: Diagnosis not present

## 2020-06-03 DIAGNOSIS — Z8774 Personal history of (corrected) congenital malformations of heart and circulatory system: Secondary | ICD-10-CM | POA: Diagnosis not present

## 2020-06-03 DIAGNOSIS — R531 Weakness: Secondary | ICD-10-CM | POA: Diagnosis not present

## 2020-06-03 DIAGNOSIS — R9431 Abnormal electrocardiogram [ECG] [EKG]: Secondary | ICD-10-CM | POA: Diagnosis not present

## 2020-06-03 DIAGNOSIS — R29898 Other symptoms and signs involving the musculoskeletal system: Secondary | ICD-10-CM | POA: Diagnosis not present

## 2020-06-03 DIAGNOSIS — Z79899 Other long term (current) drug therapy: Secondary | ICD-10-CM | POA: Diagnosis not present

## 2020-06-03 DIAGNOSIS — E063 Autoimmune thyroiditis: Secondary | ICD-10-CM | POA: Diagnosis not present

## 2020-06-03 DIAGNOSIS — I6389 Other cerebral infarction: Secondary | ICD-10-CM

## 2020-06-03 DIAGNOSIS — Z6833 Body mass index (BMI) 33.0-33.9, adult: Secondary | ICD-10-CM | POA: Diagnosis not present

## 2020-06-03 DIAGNOSIS — E039 Hypothyroidism, unspecified: Secondary | ICD-10-CM | POA: Diagnosis present

## 2020-06-03 DIAGNOSIS — I639 Cerebral infarction, unspecified: Secondary | ICD-10-CM | POA: Diagnosis not present

## 2020-06-03 DIAGNOSIS — R29700 NIHSS score 0: Secondary | ICD-10-CM | POA: Diagnosis present

## 2020-06-03 DIAGNOSIS — I69314 Frontal lobe and executive function deficit following cerebral infarction: Secondary | ICD-10-CM | POA: Diagnosis not present

## 2020-06-03 DIAGNOSIS — G8192 Hemiplegia, unspecified affecting left dominant side: Secondary | ICD-10-CM | POA: Diagnosis not present

## 2020-06-03 DIAGNOSIS — G9389 Other specified disorders of brain: Secondary | ICD-10-CM | POA: Diagnosis not present

## 2020-06-03 DIAGNOSIS — E7800 Pure hypercholesterolemia, unspecified: Secondary | ICD-10-CM | POA: Diagnosis not present

## 2020-06-03 DIAGNOSIS — E785 Hyperlipidemia, unspecified: Secondary | ICD-10-CM | POA: Diagnosis present

## 2020-06-03 DIAGNOSIS — I4430 Unspecified atrioventricular block: Secondary | ICD-10-CM | POA: Diagnosis not present

## 2020-06-03 DIAGNOSIS — N200 Calculus of kidney: Secondary | ICD-10-CM | POA: Diagnosis not present

## 2020-06-03 DIAGNOSIS — Z7982 Long term (current) use of aspirin: Secondary | ICD-10-CM

## 2020-06-03 HISTORY — DX: Personal history of other diseases of the nervous system and sense organs: Z86.69

## 2020-06-03 LAB — I-STAT CHEM 8, ED
BUN: 6 mg/dL (ref 6–20)
Calcium, Ion: 1.13 mmol/L — ABNORMAL LOW (ref 1.15–1.40)
Chloride: 103 mmol/L (ref 98–111)
Creatinine, Ser: 0.7 mg/dL (ref 0.44–1.00)
Glucose, Bld: 108 mg/dL — ABNORMAL HIGH (ref 70–99)
HCT: 40 % (ref 36.0–46.0)
Hemoglobin: 13.6 g/dL (ref 12.0–15.0)
Potassium: 3.5 mmol/L (ref 3.5–5.1)
Sodium: 141 mmol/L (ref 135–145)
TCO2: 23 mmol/L (ref 22–32)

## 2020-06-03 LAB — PROTIME-INR
INR: 0.9 (ref 0.8–1.2)
Prothrombin Time: 12.2 seconds (ref 11.4–15.2)

## 2020-06-03 LAB — ECHOCARDIOGRAM COMPLETE
Area-P 1/2: 2.99 cm2
Calc EF: 64.5 %
S' Lateral: 2.8 cm
Single Plane A2C EF: 63.2 %
Single Plane A4C EF: 65.7 %
Weight: 3174.62 oz

## 2020-06-03 LAB — CBC
HCT: 40.1 % (ref 36.0–46.0)
Hemoglobin: 13.6 g/dL (ref 12.0–15.0)
MCH: 32.1 pg (ref 26.0–34.0)
MCHC: 33.9 g/dL (ref 30.0–36.0)
MCV: 94.6 fL (ref 80.0–100.0)
Platelets: 277 10*3/uL (ref 150–400)
RBC: 4.24 MIL/uL (ref 3.87–5.11)
RDW: 12.5 % (ref 11.5–15.5)
WBC: 7.2 10*3/uL (ref 4.0–10.5)
nRBC: 0 % (ref 0.0–0.2)

## 2020-06-03 LAB — COMPREHENSIVE METABOLIC PANEL
ALT: 23 U/L (ref 0–44)
AST: 20 U/L (ref 15–41)
Albumin: 3.9 g/dL (ref 3.5–5.0)
Alkaline Phosphatase: 90 U/L (ref 38–126)
Anion gap: 12 (ref 5–15)
BUN: 7 mg/dL (ref 6–20)
CO2: 23 mmol/L (ref 22–32)
Calcium: 9.1 mg/dL (ref 8.9–10.3)
Chloride: 104 mmol/L (ref 98–111)
Creatinine, Ser: 0.81 mg/dL (ref 0.44–1.00)
GFR, Estimated: >60 mL/min (ref >60)
Glucose, Bld: 113 mg/dL — ABNORMAL HIGH (ref 70–99)
Potassium: 3.5 mmol/L (ref 3.5–5.1)
Sodium: 139 mmol/L (ref 135–145)
Total Bilirubin: 0.9 mg/dL (ref 0.3–1.2)
Total Protein: 6.8 g/dL (ref 6.5–8.1)

## 2020-06-03 LAB — URINALYSIS, ROUTINE W REFLEX MICROSCOPIC
Bacteria, UA: NONE SEEN
Bilirubin Urine: NEGATIVE
Glucose, UA: NEGATIVE mg/dL
Ketones, ur: 5 mg/dL — AB
Leukocytes,Ua: NEGATIVE
Nitrite: NEGATIVE
Protein, ur: NEGATIVE mg/dL
Specific Gravity, Urine: 1.011 (ref 1.005–1.030)
pH: 7 (ref 5.0–8.0)

## 2020-06-03 LAB — DIFFERENTIAL
Abs Immature Granulocytes: 0.02 10*3/uL (ref 0.00–0.07)
Basophils Absolute: 0.1 10*3/uL (ref 0.0–0.1)
Basophils Relative: 1 %
Eosinophils Absolute: 0.2 10*3/uL (ref 0.0–0.5)
Eosinophils Relative: 3 %
Immature Granulocytes: 0 %
Lymphocytes Relative: 30 %
Lymphs Abs: 2.1 10*3/uL (ref 0.7–4.0)
Monocytes Absolute: 0.5 10*3/uL (ref 0.1–1.0)
Monocytes Relative: 8 %
Neutro Abs: 4.2 10*3/uL (ref 1.7–7.7)
Neutrophils Relative %: 58 %

## 2020-06-03 LAB — I-STAT BETA HCG BLOOD, ED (MC, WL, AP ONLY): I-stat hCG, quantitative: <5 m[IU]/mL (ref <5)

## 2020-06-03 LAB — CBG MONITORING, ED: Glucose-Capillary: 97 mg/dL (ref 70–99)

## 2020-06-03 LAB — MRSA PCR SCREENING: MRSA by PCR: NEGATIVE

## 2020-06-03 LAB — RESP PANEL BY RT-PCR (FLU A&B, COVID) ARPGX2
Influenza A by PCR: NEGATIVE
Influenza B by PCR: NEGATIVE
SARS Coronavirus 2 by RT PCR: NEGATIVE

## 2020-06-03 LAB — APTT: aPTT: 22 seconds — ABNORMAL LOW (ref 24–36)

## 2020-06-03 IMAGING — DX DG CHEST 1V
1 series · 1 of 1 positions shown · non-contrast
Comparison: [DATE]

CLINICAL DATA: Left upper extremity weakness and tingling

EXAM:
CHEST  1 VIEW

[chest ap]
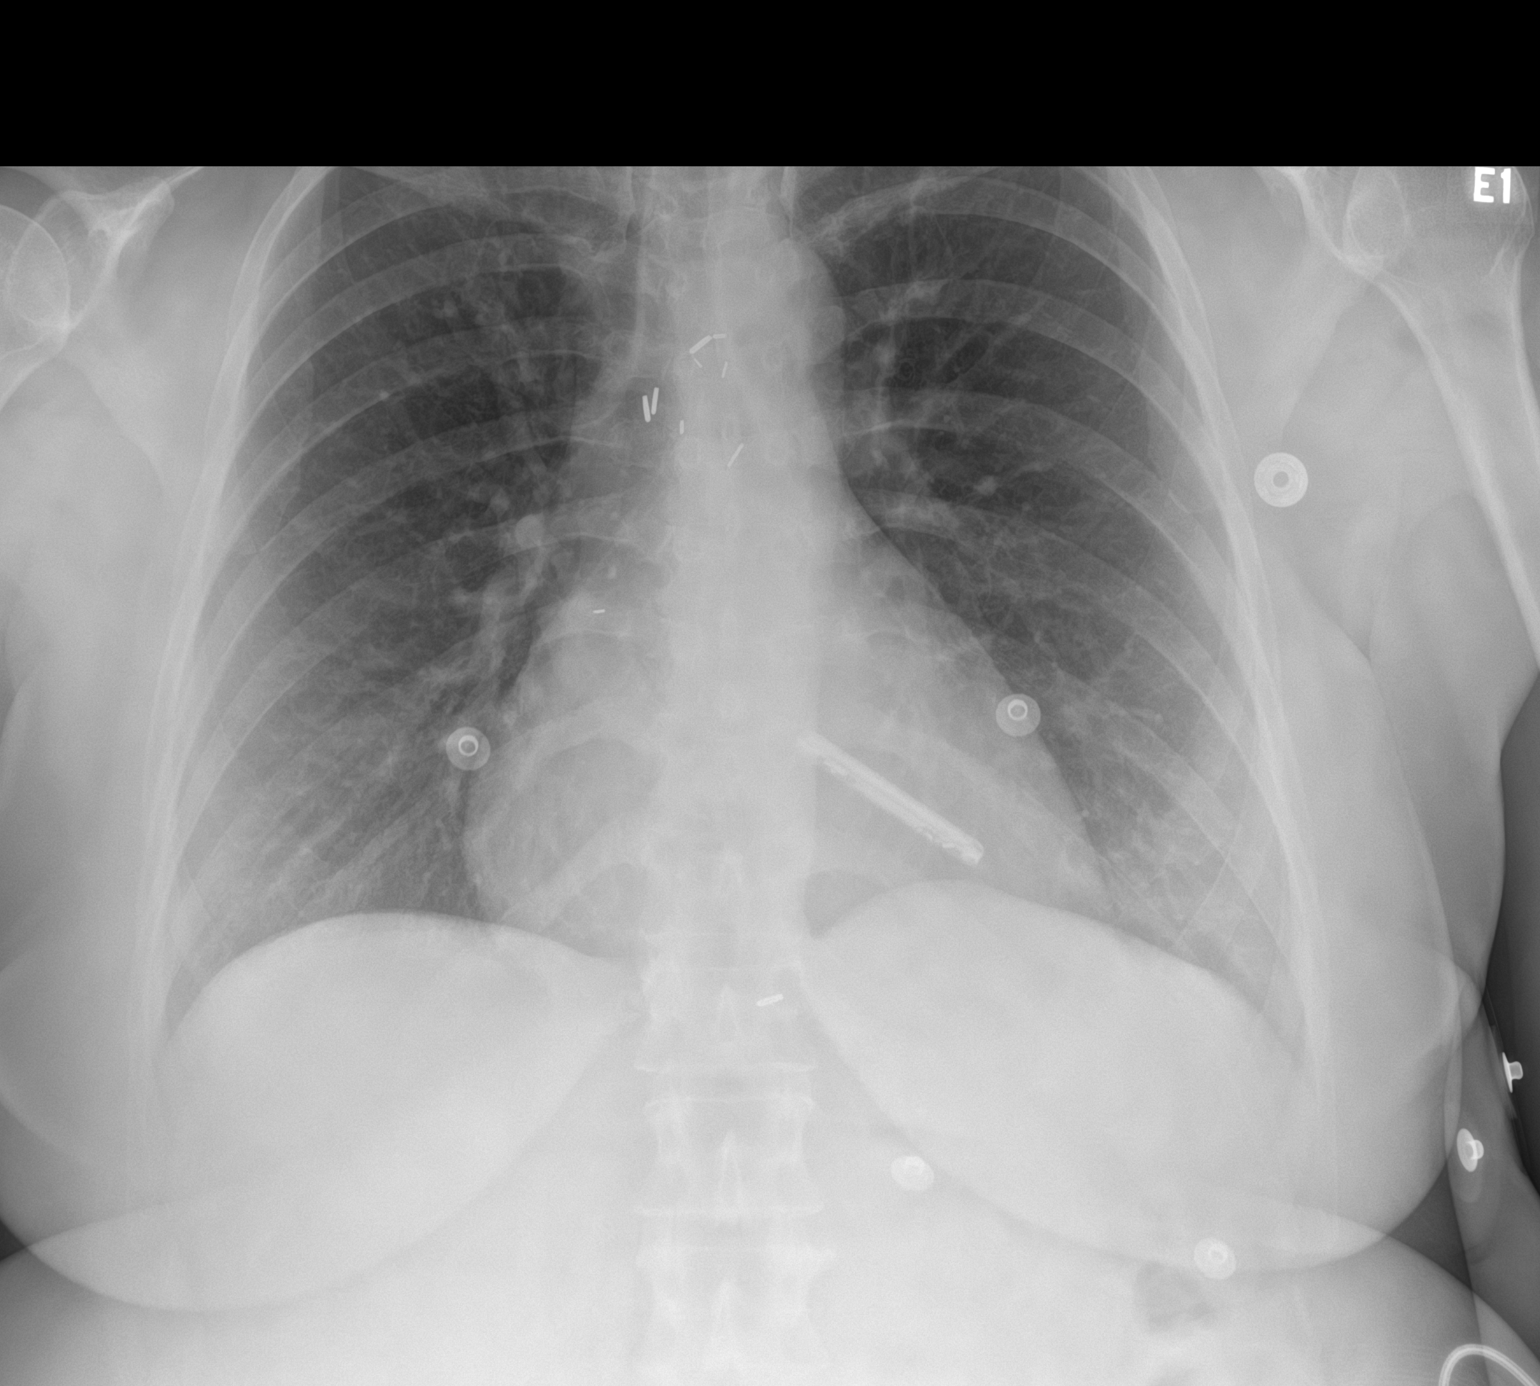

[1 of 1 positions shown; findings below may reference images not displayed]

FINDINGS: Single frontal view of the chest demonstrates a stable cardiac
silhouette. Loop recorder is seen within the left anterior chest
wall. No airspace disease, effusion, or pneumothorax. No acute bony
abnormalities.
IMPRESSION: 1. No acute intrathoracic process.

## 2020-06-03 IMAGING — CT CT HEAD CODE STROKE
4 series · 17 of 47 positions shown, 19 images · non-contrast
Comparison: None.

CLINICAL DATA: Code stroke.  Left-sided weakness

EXAM:
CT HEAD WITHOUT CONTRAST
TECHNIQUE: Contiguous axial images were obtained from the base of the skull
through the vertex without intravenous contrast.

[Series 3: head wo · axial · 0.42mm/px · z∈[-124,-10]mm · 7 of 31 slices shown, 9 images]
[im 4/31  brain]
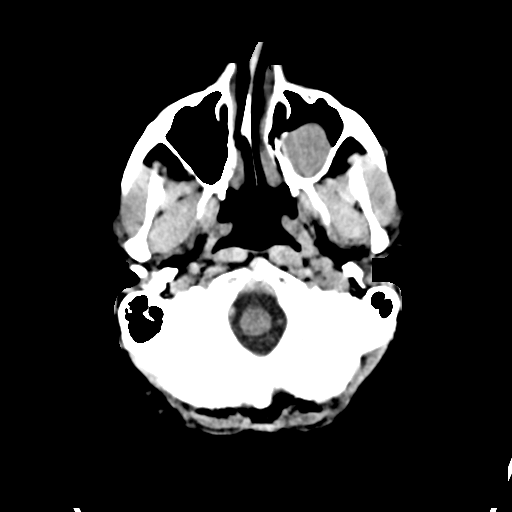
[im 4/31  bone]
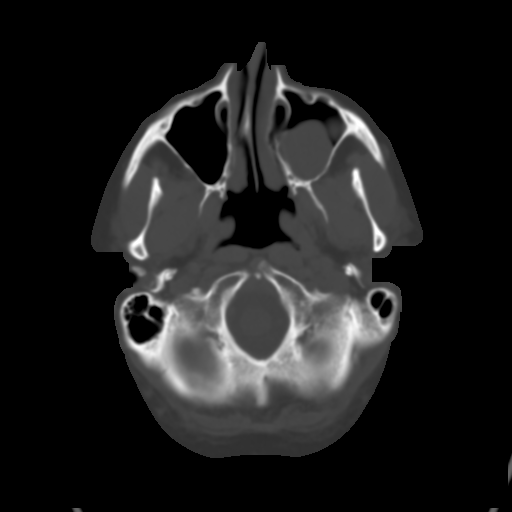
[im 8/31  brain]
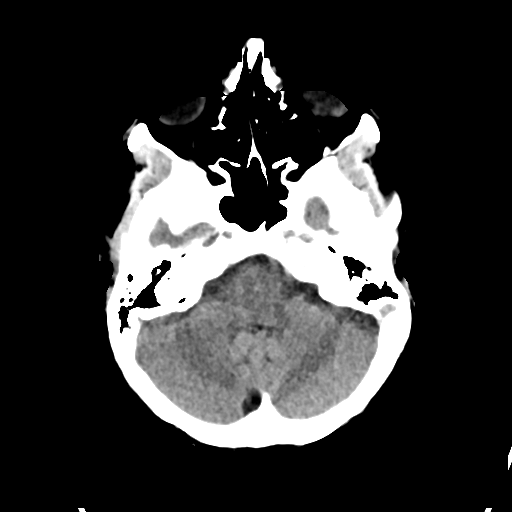
[im 12/31  brain]
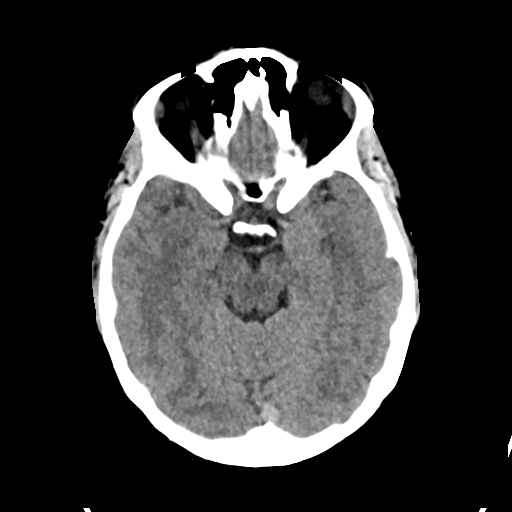
[im 16/31  brain]
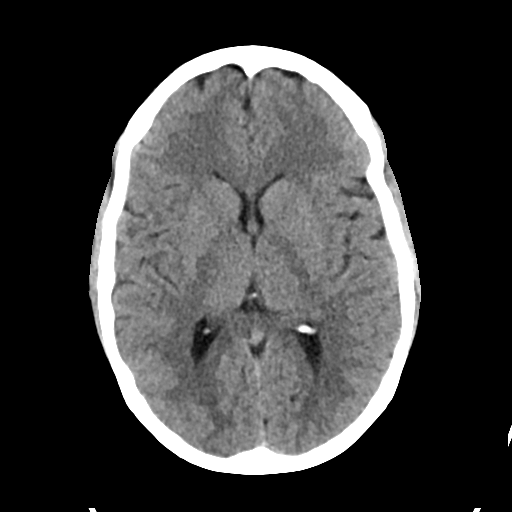
[im 19/31  brain]
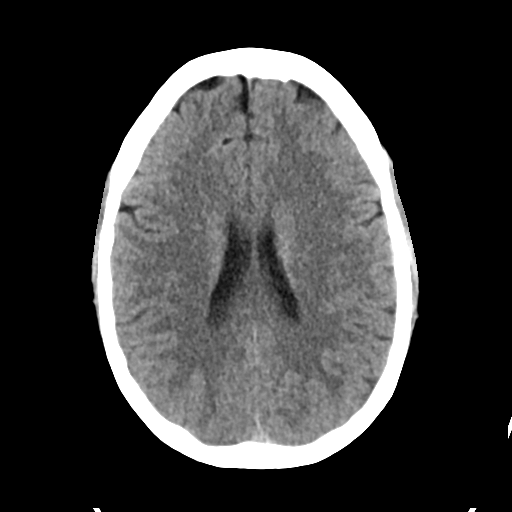
[im 19/31  bone]
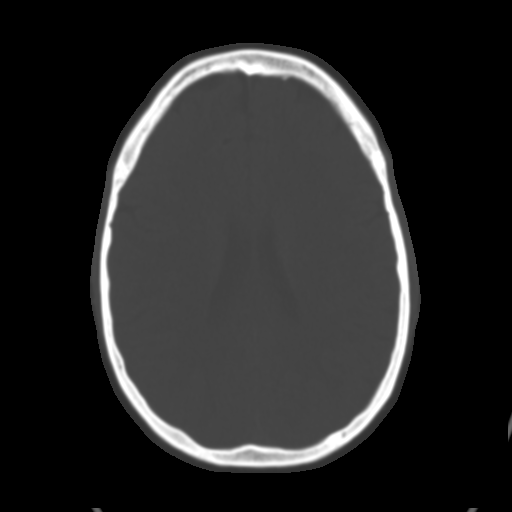
[im 23/31  brain]
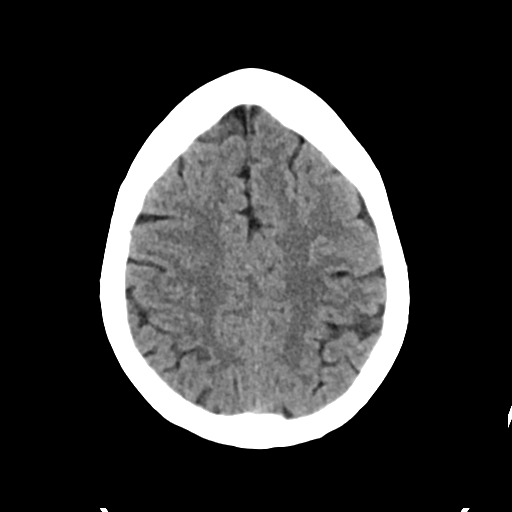
[im 27/31  brain]
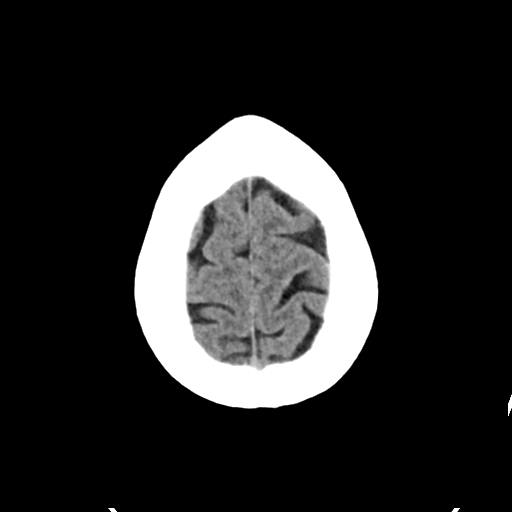

[Series 4: head bone · axial · 0.42mm/px · z∈[-126,-72]mm · 4 of 78 slices shown]
[im 8/78  bone]
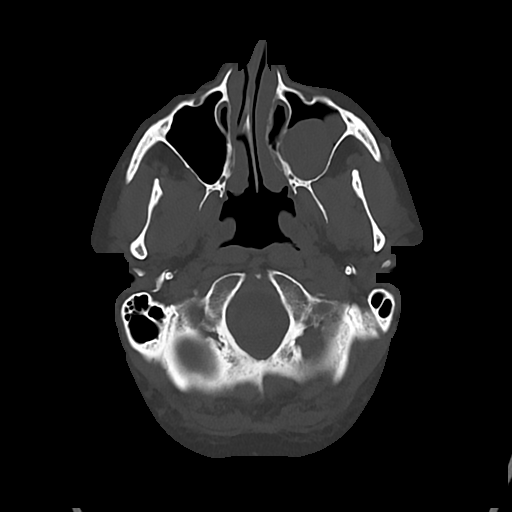
[im 16/78  bone]
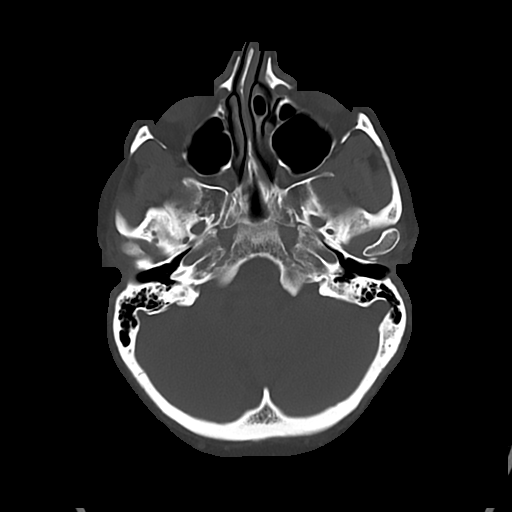
[im 24/78  bone]
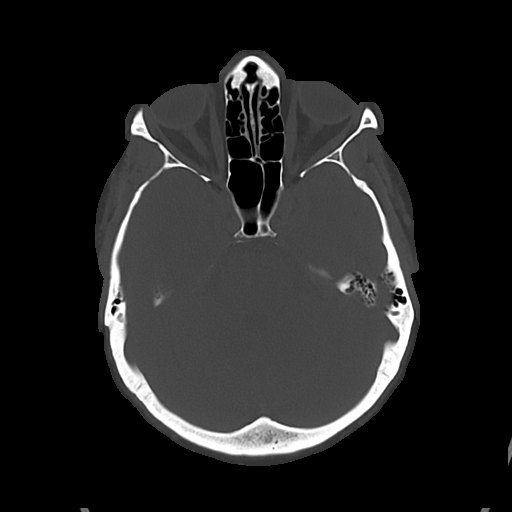
[im 35/78  bone]
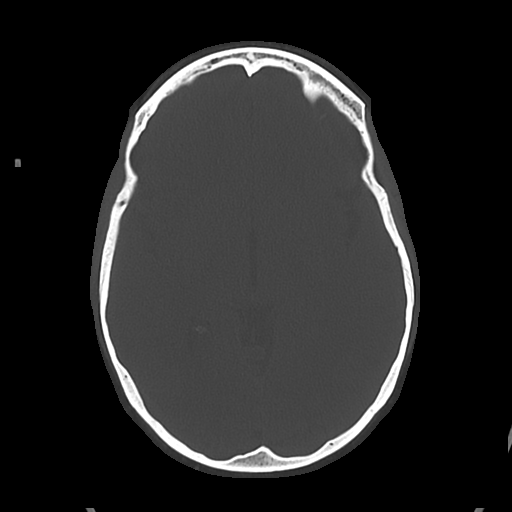

[Series 5: cor soft · coronal · 0.34mm/px · 3 of 67 slices shown]
[im 23/67  brain]
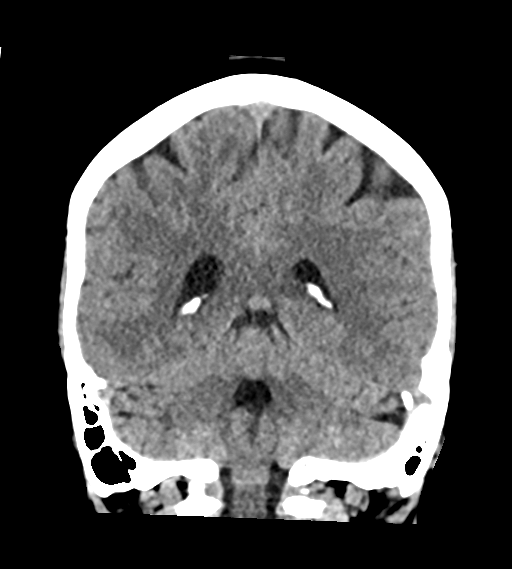
[im 30/67  brain]
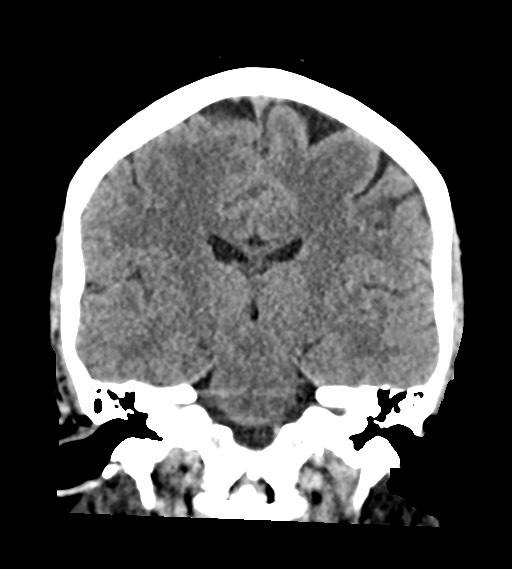
[im 37/67  brain]
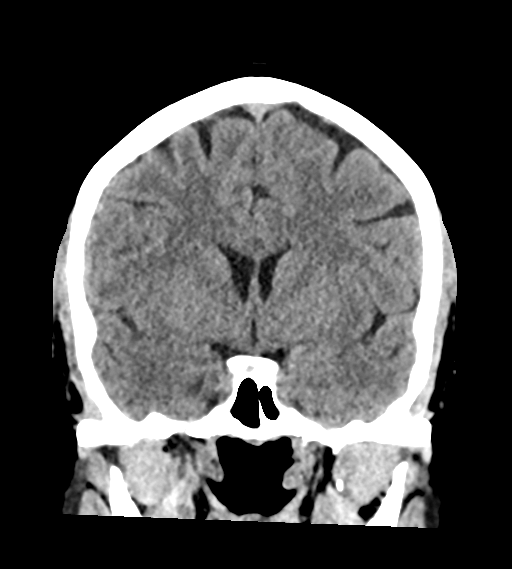

[Series 6: sag soft · sagittal · 0.37mm/px · 3 of 58 slices shown]
[im 20/58  brain]
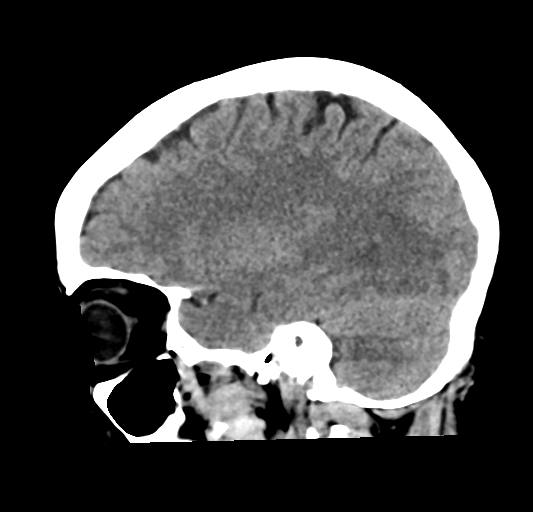
[im 29/58  brain]
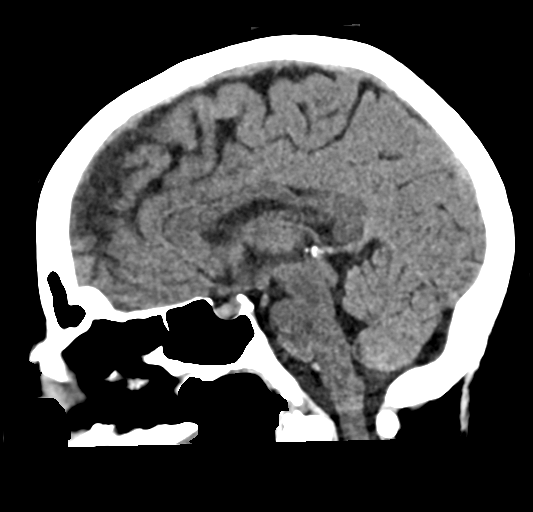
[im 39/58  brain]
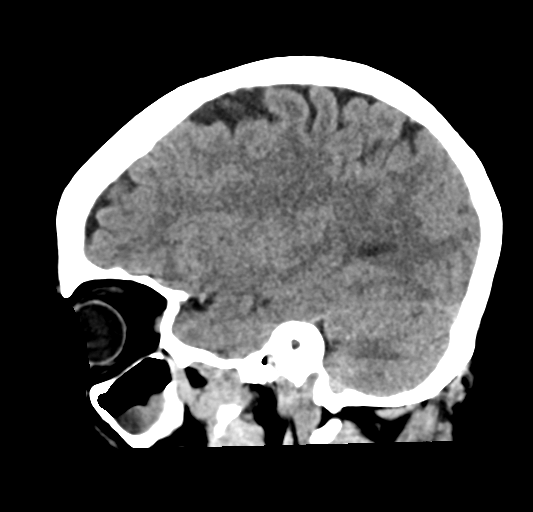

[17 of 47 positions shown; findings below may reference images not displayed]

FINDINGS: Brain: There is no acute intracranial hemorrhage, mass effect, or
edema. Gray-white differentiation is preserved. Ventricles and sulci
are normal in size and configuration. There is no extra-axial
collection.

Vascular: No hyperdense vessel.

Skull: Unremarkable.

Sinuses/Orbits: Polypoid mucosal thickening. Orbits are
unremarkable.

Other: Mastoid air cells are clear.

ASPECTS (Alberta Stroke Program Early CT Score)

- Ganglionic level infarction (caudate, lentiform nuclei, internal
capsule, insula, M1-M3 cortex): 7

- Supraganglionic infarction (M4-M6 cortex): 3

Total score (0-10 with 10 being normal): 10
IMPRESSION: There is no acute intracranial hemorrhage or evidence of acute
infarction. ASPECT score is 10.

These results were communicated to Dr. KAMARA at [DATE] on
[DATE] by text page via the [employer] messaging system.

## 2020-06-03 MED ORDER — PANTOPRAZOLE SODIUM 40 MG IV SOLR
40.0000 mg | Freq: Every day | INTRAVENOUS | Status: DC
  Administered 2020-06-03: 40 mg via INTRAVENOUS
  Filled 2020-06-03: qty 40

## 2020-06-03 MED ORDER — STROKE: EARLY STAGES OF RECOVERY BOOK: Freq: Once | Status: DC

## 2020-06-03 MED ORDER — PERFLUTREN LIPID MICROSPHERE
1.0000 mL | INTRAVENOUS | Status: AC | PRN
  Administered 2020-06-03: 2 mL via INTRAVENOUS
  Filled 2020-06-03: qty 10

## 2020-06-03 MED ORDER — SODIUM CHLORIDE 0.9 % IV SOLN
50.0000 mL | Freq: Once | INTRAVENOUS | Status: AC
  Administered 2020-06-03: 50 mL via INTRAVENOUS

## 2020-06-03 MED ORDER — LABETALOL HCL 5 MG/ML IV SOLN: 20.0000 mg | Freq: Once | INTRAVENOUS | Status: DC

## 2020-06-03 MED ORDER — CHLORHEXIDINE GLUCONATE CLOTH 2 % EX PADS
6.0000 | MEDICATED_PAD | Freq: Every day | CUTANEOUS | Status: DC
  Administered 2020-06-04 – 2020-06-06 (×3): 6 via TOPICAL

## 2020-06-03 MED ORDER — CLEVIDIPINE BUTYRATE 0.5 MG/ML IV EMUL: 0.0000 mg/h | INTRAVENOUS | Status: DC

## 2020-06-03 MED ORDER — SENNOSIDES-DOCUSATE SODIUM 8.6-50 MG PO TABS: 1.0000 | ORAL_TABLET | Freq: Every evening | ORAL | Status: DC | PRN

## 2020-06-03 MED ORDER — ACETAMINOPHEN 650 MG RE SUPP: 650.0000 mg | RECTAL | Status: DC | PRN

## 2020-06-03 MED ORDER — ALTEPLASE (STROKE) FULL DOSE INFUSION
0.9000 mg/kg | Freq: Once | INTRAVENOUS | Status: AC
  Administered 2020-06-03: 72.9 mg via INTRAVENOUS
  Filled 2020-06-03: qty 100

## 2020-06-03 MED ORDER — ACETAMINOPHEN 325 MG PO TABS: 650.0000 mg | ORAL_TABLET | ORAL | Status: DC | PRN

## 2020-06-03 MED ORDER — SODIUM CHLORIDE 0.9% FLUSH: 3.0000 mL | Freq: Once | INTRAVENOUS | Status: DC

## 2020-06-03 MED ORDER — ACETAMINOPHEN 160 MG/5ML PO SOLN: 650.0000 mg | ORAL | Status: DC | PRN

## 2020-06-03 MED ORDER — SODIUM CHLORIDE 0.9 % IV SOLN: INTRAVENOUS | Status: DC

## 2020-06-03 NOTE — Telephone Encounter (Signed)
Pt. states when she went home for lunch today, her left arm went numb. She states she has little strength in hand, her arm feels very heavy & she has no control with her hand or wrist. She's asking if she needs to come in to be seen, or go to the ER. Please advise.

## 2020-06-03 NOTE — Code Documentation (Signed)
Stroke Response Nurse Documentation Code Documentation  [name] is a 49 y.o. female arriving to [facility] ED via [facility] on 06/03/20 with past medical hx ischemic stroke with tPA September 2021, migraine and ASD closure. Had loop recorder placed recently. Code stroke was activated by EMS. Patient from home where she was LKW at 1315 and now complaining of difficulty moving left arm and hand. Patient is left hand dominant. On aspirin 81 mg daily. Recently (11/16) instructed to stop taking Plavix.   Stroke team at the bedside on patient arrival. Labs drawn and patient cleared for CT by Dr. Charm Barges. Patient to CT with team. NIHSS 0, see documentation for details and code stroke times. Patient states "heaviness" in left arm. Slow to move but no drift noted on exam. The following imaging was completed: CT.   Patient is a candidate for tPA. BP 135/66 at 1456. Second IV established while tPA prepared for administration. tPA started at 1458. Care/Plan assessments per post tPA protocol. Admit to 3W if bed available and patient remains stable. Bedside handoff with ED RN Eulis Foster.    Ferman Hamming Stroke Response RN

## 2020-06-03 NOTE — Progress Notes (Signed)
FOR OPTIMISTMAIN TRIAL  NIHSS 1a Level of Conscious.: 0 1b LOC Questions: 0 1c LOC Commands: 0 2 Best Gaze: 0 3 Visual: 0 4 Facial Palsy: 0 5a Motor Arm - left: 0 5b Motor Arm - Right: 0 6a Motor Leg - Left: 0 6b Motor Leg - Right: 0 7 Limb Ataxia: 0 8 Sensory: 1 9 Best Language: 0 10 Dysarthria: 0 11 Extinct. and Inatten.: 0 TOTAL: 1    -- [name], MD [facility] Pager: [phone]

## 2020-06-03 NOTE — Telephone Encounter (Signed)
I relayed message to pt. from NP.  She's also asking if she may be faxed over a doctor's note, stating she was here 05/27/20 for MRI. She states she forgotten to get it last visit.

## 2020-06-03 NOTE — Progress Notes (Signed)
  Echocardiogram 2D Echocardiogram has been performed.  [name] 06/03/2020, 4:31 PM

## 2020-06-03 NOTE — Telephone Encounter (Signed)
That's fine.  Thank you.

## 2020-06-03 NOTE — Telephone Encounter (Signed)
I called mobile and LMVM for pt that with her sx that she is having to go to ED for evaluation.

## 2020-06-03 NOTE — ED Provider Notes (Signed)
[facility] EMERGENCY DEPARTMENT Provider Note   CSN: [id number] Arrival date & time: 06/03/20  1444     History Chief Complaint  Patient presents with  . Code Stroke    [name] is a 49 y.o. female.  Patient is a 49 year old female with prior history of ischemic stroke without definitive cause who had been on Plavix but was discontinued 3 weeks ago also with prior ASD closure and minimal PFO on TEE who currently has a loop recorder presenting today with sudden onset of left arm weakness and mild numbness.  Patient reports that this started at lunch today and at first it just felt kind of tingly so she drove back to work and when she got to work checked her blood pressure was 130/70 she went back to her desk and tried to start typing and reported that her left hand would not work.  She denied any alexia, agraphia or aphasia.  She denied any involvement in her left leg today or face.  She reports when she had the stroke 3 months ago and involved her right arm and leg and she did require TPA at that time.  Her symptoms completely resolved prior to discharge but she has noticed over the last 1 to 2 months that she occasionally has difficulty saying certain words.  She denies any headache, neck pain, recent trauma.  She denies chest pain, fever, cough or shortness of breath.  No abdominal pain or vomiting.  She was in her normal state of health prior to symptoms starting today.  The history is provided by the patient and medical records.       Past Medical History:  Diagnosis Date  . No diagnosis     Patient Active Problem List   Diagnosis Date Noted  . Received intravenous tissue plasminogen activator (tPA) in emergency department   . Acute ischemic stroke (HCC) 03/11/2020  . Hypothyroid 12/22/2011  . Stress incontinence 12/22/2011  . Family history of kidney disease 12/22/2011  . Routine gynecological examination 12/22/2011  . Routine general medical  examination at a health care facility 12/19/2011  . SHOULDER PAIN, RIGHT 10/03/2008    Past Surgical History:  Procedure Laterality Date  . BUBBLE STUDY  03/13/2020   Procedure: BUBBLE STUDY;  Surgeon: Quintella Reichert, MD;  Location: [facility] ENDOSCOPY;  Service: Cardiovascular;;  . ENDOMETRIAL ABLATION     Uterine/heavy menses  . SHOULDER SURGERY     Left,/bone spurs  . TEE WITHOUT CARDIOVERSION N/A 03/13/2020   Procedure: TRANSESOPHAGEAL ECHOCARDIOGRAM (TEE);  Surgeon: Quintella Reichert, MD;  Location: [facility] ENDOSCOPY;  Service: Cardiovascular;  Laterality: N/A;     OB History   No obstetric history on file.     No family history on file.  Social History   Tobacco Use  . Smoking status: Former Games developer  . Smokeless tobacco: Never Used  Substance Use Topics  . Alcohol use: Yes    Alcohol/week: 1.0 standard drink    Types: 1 Cans of beer per week    Comment: states 1 beer every other day or so   . Drug use: Not on file    Home Medications Prior to Admission medications   Medication Sig Start Date End Date Taking? Authorizing Provider  aspirin EC 81 MG EC tablet Take 1 tablet (81 mg total) by mouth daily. Swallow whole. 03/15/20   Rinehuls, Kinnie Scales, PA-C  atorvastatin (LIPITOR) 20 MG tablet Take 1 tablet (20 mg total) by mouth daily. 03/15/20  Rinehuls, David L, PA-C  metoprolol tartrate (LOPRESSOR) 100 MG tablet Take 1 tablet (100 mg total) by mouth once for 1 dose. Take 2 hours prior to your cardiac CT appointment. 05/13/20 05/20/20  Tonny Bollman, MD  Rimegepant Sulfate (NURTEC) 75 MG TBDP Take 75 mg by mouth daily as needed (Acute migraine management). 05/20/20   Ihor Austin, NP    Allergies    Patient has no known allergies.  Review of Systems   Review of Systems  All other systems reviewed and are negative.   Physical Exam Updated Vital Signs BP 135/66   Pulse 74   SpO2 98%   Physical Exam Vitals and nursing note reviewed.  Constitutional:      General: She is not  in acute distress.    Appearance: She is well-developed. She is obese.     Comments: Slightly anxious  HENT:     Head: Normocephalic and atraumatic.  Eyes:     Pupils: Pupils are equal, round, and reactive to light.  Cardiovascular:     Rate and Rhythm: Normal rate and regular rhythm.     Pulses: Normal pulses.     Heart sounds: Normal heart sounds. No murmur heard.  No friction rub.     Comments: Healing scar from surgical wound with Steri-Strips present in the left sternal area of the chest Pulmonary:     Effort: Pulmonary effort is normal.     Breath sounds: Normal breath sounds. No wheezing or rales.  Abdominal:     General: Bowel sounds are normal. There is no distension.     Palpations: Abdomen is soft.     Tenderness: There is no abdominal tenderness. There is no guarding or rebound.  Musculoskeletal:        General: No tenderness. Normal range of motion.     Comments: No edema  Skin:    General: Skin is warm and dry.     Findings: No rash.  Neurological:     Mental Status: She is alert and oriented to person, place, and time.     Cranial Nerves: No cranial nerve deficit, dysarthria or facial asymmetry.     Sensory: Sensory deficit present.     Motor: Weakness and pronator drift present.     Coordination: Coordination is intact. Heel to [facility] Test normal.     Comments: Left upper extremity weakness with mild decreased sensation and pronator drift.  Right upper extremity 5 out of 5 with no drift.  Bilateral lower extremities with 5 out of 5 strength normal sensation.  No aphasia.  Psychiatric:        Behavior: Behavior normal.      ED Results / Procedures / Treatments   Labs (all labs ordered are listed, but only abnormal results are displayed) Labs Reviewed  APTT - Abnormal; Notable for the following components:      Result Value   aPTT 22 (*)    All other components within normal limits  I-STAT CHEM 8, ED - Abnormal; Notable for the following components:    Glucose, Bld 108 (*)    Calcium, Ion 1.13 (*)    All other components within normal limits  RESP PANEL BY RT-PCR (FLU A&B, COVID) [id number]  PROTIME-INR  CBC  DIFFERENTIAL  COMPREHENSIVE METABOLIC PANEL  CBG MONITORING, ED  I-STAT BETA HCG BLOOD, ED ([facility], [facility], [facility] ONLY)    EKG None  Radiology CT HEAD CODE STROKE WO CONTRAST  Result Date: 06/03/2020 CLINICAL DATA:  Code stroke.  Left-sided weakness EXAM: CT HEAD WITHOUT CONTRAST TECHNIQUE: Contiguous axial images were obtained from the base of the skull through the vertex without intravenous contrast. COMPARISON:  None. FINDINGS: Brain: There is no acute intracranial hemorrhage, mass effect, or edema. Gray-white differentiation is preserved. Ventricles and sulci are normal in size and configuration. There is no extra-axial collection. Vascular: No hyperdense vessel. Skull: Unremarkable. Sinuses/Orbits: Polypoid mucosal thickening. Orbits are unremarkable. Other: Mastoid air cells are clear. ASPECTS (Alberta Stroke Program Early CT Score) - Ganglionic level infarction (caudate, lentiform nuclei, internal capsule, insula, M1-M3 cortex): 7 - Supraganglionic infarction (M4-M6 cortex): 3 Total score (0-10 with 10 being normal): 10 IMPRESSION: There is no acute intracranial hemorrhage or evidence of acute infarction. ASPECT score is 10. These results were communicated to Dr. Wilford Corner at 2:59 pm on 06/03/2020 by text page via the [facility] messaging system. Electronically Signed   By: Guadlupe Spanish M.D.   On: 06/03/2020 15:02    Procedures Procedures (including critical care time)  Medications Ordered in ED Medications  sodium chloride flush (NS) 0.9 % injection 3 mL (has no administration in time range)   stroke: mapping our early stages of recovery book (has no administration in time range)  0.9 %  sodium chloride infusion (has no administration in time range)  acetaminophen (TYLENOL) tablet 650 mg (has no administration in time range)    Or    acetaminophen (TYLENOL) 160 MG/5ML solution 650 mg (has no administration in time range)    Or  acetaminophen (TYLENOL) suppository 650 mg (has no administration in time range)  senna-docusate (Senokot-S) tablet 1 tablet (has no administration in time range)  pantoprazole (PROTONIX) injection 40 mg (has no administration in time range)  labetalol (NORMODYNE) injection 20 mg (has no administration in time range)    And  clevidipine (CLEVIPREX) infusion 0.5 mg/mL (has no administration in time range)    ED Course  I have reviewed the triage vital signs and the nursing notes.  Pertinent labs & imaging results that were available during my care of the patient were reviewed by me and considered in my medical decision making (see chart for details).    MDM Rules/Calculators/A&P                          49 year old female presenting today as a code stroke with sudden onset of left upper extremity weakness and numbness.  Patient with prior history of ischemic stroke that involved the right side approximately 3 months ago.  Only change was patient stopped her Plavix 2 weeks ago based on doctor's recommendation.  She currently has a loop recorder but reported she recently had an MRI last week due to some word finding difficulties as well as cardiac imaging last week all which were within normal limits.  TEE with minimal PFO and prior ASD repair.  Patient was seen immediately upon arrival by stroke team.  She went to CT without evidence of any acute bleed and was started on TPA given her sudden onset of symptoms and being in the window and prior history of stroke.  Patient denies any headache, has normal mental status and does not take any anticoagulation at this time.  Labs are reassuring with normal CBC, i-STAT Chem-8, coags, EKG and hCG.  Patient will need admission to neurology.  MDM Number of Diagnoses or Management Options   Amount and/or Complexity of Data Reviewed Clinical lab tests: ordered  and reviewed Tests in the radiology section  of CPT: ordered and reviewed Tests in the medicine section of CPT: ordered and reviewed Decide to obtain previous medical records or to obtain history from someone other than the patient: yes Obtain history from someone other than the patient: yes Review and summarize past medical records: yes Discuss the patient with other providers: yes Independent visualization of images, tracings, or specimens: yes  Risk of Complications, Morbidity, and/or Mortality Presenting problems: high Diagnostic procedures: low Management options: high  Patient Progress Patient progress: stable  CRITICAL CARE Performed by: Cirilo Canner Total critical care time: 30 minutes Critical care time was exclusive of separately billable procedures and treating other patients. Critical care was necessary to treat or prevent imminent or life-threatening deterioration. Critical care was time spent personally by me on the following activities: development of treatment plan with patient and/or surrogate as well as nursing, discussions with consultants, evaluation of patient's response to treatment, examination of patient, obtaining history from patient or surrogate, ordering and performing treatments and interventions, ordering and review of laboratory studies, ordering and review of radiographic studies, pulse oximetry and re-evaluation of patient's condition.  Final Clinical Impression(s) / ED Diagnoses Final diagnoses:  Left-sided weakness  Acute ischemic stroke [facility](HCC)    Rx / DC Orders ED Discharge Orders    None       Gwyneth SproutPlunkett, Mckensey Berghuis, MD 06/03/20 1520

## 2020-06-03 NOTE — ED Triage Notes (Signed)
PT BIB by [facility] after pt called report new onset left UE weakness, tingling, heaviness. Pt has has hx of prior stroke and hyperlipidemia, and BFO.

## 2020-06-03 NOTE — H&P (Signed)
H&P - STROKE tPA admission  CC: Left-sided numbness weakness  History is obtained from: Patient, chart  HPI: [name] is a 49 y.o. left-hand-dominant female who has a past medical history of Hashimoto thyroiditis, ASD closure 30 years ago, embolic appearing right parietal occipital and left frontal infarcts in September 2021 status post IV TPA with near complete resolution of all symptoms on discharge and some intermittent speech-word finding difficulty and a normal MRI a few days ago, presented with sudden onset of left arm numbness and weakness.  She was returning home for her lunch break when she noticed suddenly that her left arm is difficult to lift-she felt heaviness as well as numbness over her left arm.  She did not feel much symptoms in her face but felt her leg was also weak and difficult to walk. She denied any headaches at this time. She had recently had a outpatient evaluation and loop recorder placement by cardiology. She was seen in the stroke follow-up clinic 05/20/2020 and was transition from dual antiplatelet to aspirin only. Her TEE as part of the last stroke work-up was suggestive of a small interatrial level shunt although not very clear.  Because of the recent stroke, embolic work-up with loop recorder was performed.  No A. fib noted as of yet.  LKW: 1:30 PM on 06/03/2020 tpa given?:  Yes Premorbid modified Rankin scale (mRS): 1  ROS: Negative except as noted in HPI Past Medical History:  Diagnosis Date  . No diagnosis     No family history on file.  Social History:   reports that she has quit smoking. She has never used smokeless tobacco. She reports current alcohol use of about 1.0 standard drink of alcohol per week. No history on file for drug use.  Medications  Current Facility-Administered Medications:  .  sodium chloride flush (NS) 0.9 % injection 3 mL, 3 mL, Intravenous, Once, Alvira Monday, MD  Current Outpatient Medications:  .  aspirin EC 81  MG EC tablet, Take 1 tablet (81 mg total) by mouth daily. Swallow whole., Disp: 30 tablet, Rfl: 11 .  atorvastatin (LIPITOR) 20 MG tablet, Take 1 tablet (20 mg total) by mouth daily., Disp: 30 tablet, Rfl: 2 .  metoprolol tartrate (LOPRESSOR) 100 MG tablet, Take 1 tablet (100 mg total) by mouth once for 1 dose. Take 2 hours prior to your cardiac CT appointment., Disp: 1 tablet, Rfl: 0 .  Rimegepant Sulfate (NURTEC) 75 MG TBDP, Take 75 mg by mouth daily as needed (Acute migraine management)., Disp: 2 tablet, Rfl: 0  Exam: Current vital signs: BP 135/66   Pulse 74   SpO2 98%  Vital signs in last 24 hours: Pulse Rate:  [74] 74 (11/30 1456) BP: (135)/(66) 135/66 (11/30 1456) SpO2:  [98 %] 98 % (11/30 1507) GENERAL: Awake, alert in NAD HEENT: - Normocephalic and atraumatic, dry mm, no LN++, no Thyromegally LUNGS - Clear to auscultation bilaterally with no wheezes CV - S1S2 RRR, no m/r/g, equal pulses bilaterally. ABDOMEN - Soft, nontender, nondistended with normoactive BS Ext: warm, well perfused, intact peripheral pulses, no edema  NEURO:  Mental Status: AA&Ox3  Language: speech is nondysarthric.  Naming, repetition, fluency, and comprehension intact. Cranial Nerves: PERRL. EOMI, visual fields full, no facial asymmetry, facial sensation intact, hearing intact, tongue/uvula/soft palate midline, normal sternocleidomastoid and trapezius muscle strength. No evidence of tongue atrophy or fibrillations Motor: Left upper extremity is 4/5 with vertical drift and pronator drift.  There is definite slowness in finger taps  on the left.  Left lower extremity is nearly full strength without vertical drift.  Right side upper and lower extremity-no drift.  5/5. Tone: is normal and bulk is normal Sensation- Intact to light touch bilaterally but says that the left arm feels heavier subjectively. Coordination: FTN intact on the right, some dysmetria on the left not disproportionate to the weakness, no ataxia  in BLE. Gait- deferred  NIHSS 1a Level of Conscious.: 0 1b LOC Questions: 0 1c LOC Commands: 0 2 Best Gaze: 0 3 Visual: 0 4 Facial Palsy: 0 5a Motor Arm - left: 1 5b Motor Arm - Right: 0 6a Motor Leg - Left: 0 6b Motor Leg - Right: 0 7 Limb Ataxia: 0 8 Sensory: 1 9 Best Language: 0 10 Dysarthria: 0 11 Extinct. and Inatten.: 0 TOTAL: 2    Labs I have reviewed labs in epic and the results pertinent to this consultation are:  CBC    Component Value Date/Time   WBC 7.2 06/03/2020 1446   RBC 4.24 06/03/2020 1446   HGB 13.6 06/03/2020 1458   HCT 40.0 06/03/2020 1458   PLT 277 06/03/2020 1446   MCV 94.6 06/03/2020 1446   MCH 32.1 06/03/2020 1446   MCHC 33.9 06/03/2020 1446   RDW 12.5 06/03/2020 1446   LYMPHSABS 2.1 06/03/2020 1446   MONOABS 0.5 06/03/2020 1446   EOSABS 0.2 06/03/2020 1446   BASOSABS 0.1 06/03/2020 1446    CMP     Component Value Date/Time   NA 141 06/03/2020 1458   NA 138 05/12/2020 1652   K 3.5 06/03/2020 1458   CL 103 06/03/2020 1458   CO2 24 05/12/2020 1652   GLUCOSE 108 (H) 06/03/2020 1458   BUN 6 06/03/2020 1458   BUN 10 05/12/2020 1652   CREATININE 0.70 06/03/2020 1458   CALCIUM 9.2 05/12/2020 1652   PROT 6.9 03/11/2020 1434   ALBUMIN 3.4 (L) 03/12/2020 1811   AST 20 03/11/2020 1434   ALT 20 03/11/2020 1434   ALKPHOS 71 03/11/2020 1434   BILITOT 0.9 03/11/2020 1434   GFRNONAA 92 05/12/2020 1652   GFRAA 106 05/12/2020 1652    Lipid Panel     Component Value Date/Time   CHOL 160 05/20/2020 0909   TRIG 141 05/20/2020 0909   HDL 56 05/20/2020 0909   CHOLHDL 2.9 05/20/2020 0909   CHOLHDL 3.6 03/12/2020 0612   VLDL 30 03/12/2020 0612   LDLCALC 80 05/20/2020 0909     Imaging I have reviewed the images obtained:  CT-scan of the brain-no acute changes.  MRI examination of the brain-05/27/2020-mild left parietal chronic ischemic infarction which is stable.  Chronic sinusitis changes.  No acute findings.  MRI brain from  September 2021 with punctate left frontal infarct and additional right parieto-occipital infarcts.  Assessment:  49 year old with above past medical history presenting with sudden onset of left-sided numbness and weakness involving mostly the arm and some involvement of the leg initially. Prior strokes were in the right parieto-occipital area and left frontal lobe-small embolic-looking with suspicion for a cardiac source for which a loop recorder has been recently placed. During her last presentation, she presented with right-sided weakness, for which she got IV TPA. Her current presentation involves her dominant left side-NIH stroke scale is low at 2 and last stroke was March 12, 2020-not exactly 3 months but nearly 3 months from now. I discussed the risks and benefits of IV TPA given the fact that the left-sided weakness might leave her with significant disability  and she agreed to proceed with use of IV TPA.  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 until cleared by swallow screen -No antiplatelets or anticoagulants until 24-hour post TPA scan is negative for bleed. -Obtain MRI of the head without contrast and MRA neck without contrast and MRI of the brain is obtained -Obtain MRI of the brain 24 hours from TPA. -Gentle hydration-normal saline 75 cc an hour -Check labs and replete electrolytes as necessary -Chest x-ray -Urinalysis  Blood pressure goal-systolic blood pressure less than 180 per tPA protocol  Discussed the plan in detail with the patient. Discussed the plan with ED provider Stroke team to follow the patient   -- [name], MD [facility] Pager: [phone]  CRITICAL CARE  ATTESTATION Performed by: [name], MD Total critical care time: 59 minutes Critical care time was exclusive of separately billable procedures and treating other patients and/or supervising APPs/Residents/Students Critical care was necessary to treat or prevent imminent or life-threatening deterioration due to acute ischemic stroke, IV thrombolysis This patient is critically ill and at significant risk for neurological worsening and/or death and care requires constant monitoring. Critical care was time spent personally by me on the following activities: development of treatment plan with patient and/or surrogate as well as nursing, discussions with consultants, evaluation of patient's response to treatment, examination of patient, obtaining history from patient or surrogate, ordering and performing treatments and interventions, ordering and review of laboratory studies, ordering and review of radiographic studies, pulse oximetry, re-evaluation of patient's condition, participation in multidisciplinary rounds and medical decision making of high complexity in the care of this patient.

## 2020-06-03 NOTE — Progress Notes (Signed)
PHARMACIST CODE STROKE RESPONSE  Notified to mix tPA at 1452 by Dr. Wilford Corner Delivered tPA to RN at 1455  tPA dose = 8.1mg  bolus over 1 minute followed by 72.9mg  for a total dose of 81mg  over 1 hour  Issues/delays encountered (if applicable):   PharmD. BCPS  06/03/20 3:27 PM

## 2020-06-03 NOTE — Telephone Encounter (Signed)
Letter was written if ok will send thru mychart.

## 2020-06-03 NOTE — ED Provider Notes (Signed)
MSE was initiated and I personally evaluated the patient and placed orders (if any) at  2:48 PM on June 03, 2020.  The patient appears stable so that the remainder of the MSE may be completed by another provider.  49 year old female with prior history of stroke and TPA here with acute onset of left arm weakness at 1:20 PM today.  Does not appear to involve vision face or leg.  Sensation intact bilaterally.  Airway intact.  She is emergently going to CT with neurology has a stroke activation.  Patient will be signed out to oncoming provider for further evaluation.   Terrilee Files, MD 06/03/20 [phone]

## 2020-06-04 ENCOUNTER — Encounter (HOSPITAL_COMMUNITY): Payer: Self-pay | Admitting: Student in an Organized Health Care Education/Training Program

## 2020-06-04 ENCOUNTER — Inpatient Hospital Stay (HOSPITAL_COMMUNITY): Payer: BC Managed Care – PPO

## 2020-06-04 DIAGNOSIS — I639 Cerebral infarction, unspecified: Principal | ICD-10-CM

## 2020-06-04 DIAGNOSIS — E7800 Pure hypercholesterolemia, unspecified: Secondary | ICD-10-CM

## 2020-06-04 DIAGNOSIS — Z9282 Status post administration of tPA (rtPA) in a different facility within the last 24 hours prior to admission to current facility: Secondary | ICD-10-CM

## 2020-06-04 DIAGNOSIS — E063 Autoimmune thyroiditis: Secondary | ICD-10-CM

## 2020-06-04 DIAGNOSIS — Z8673 Personal history of transient ischemic attack (TIA), and cerebral infarction without residual deficits: Secondary | ICD-10-CM

## 2020-06-04 LAB — LIPID PANEL
Cholesterol: 136 mg/dL (ref 0–200)
HDL: 50 mg/dL (ref >40)
LDL Cholesterol: 64 mg/dL (ref 0–99)
Total CHOL/HDL Ratio: 2.7 RATIO
Triglycerides: 109 mg/dL (ref <150)
VLDL: 22 mg/dL (ref 0–40)

## 2020-06-04 LAB — TSH: TSH: 2.876 u[IU]/mL (ref 0.350–4.500)

## 2020-06-04 LAB — HEMOGLOBIN A1C
Hgb A1c MFr Bld: 5.3 % (ref 4.8–5.6)
Mean Plasma Glucose: 105.41 mg/dL

## 2020-06-04 LAB — T4, FREE: Free T4: 0.79 ng/dL (ref 0.61–1.12)

## 2020-06-04 IMAGING — MR MR MRA NECK WO/W CM
8 of 11 series · 41 of 48 positions shown · non-contrast
Comparison: CTA [DATE].  MRI [DATE].
COMPARISON: CTA [DATE].  MRI [DATE].

Addendum:
CLINICAL DATA: Stroke follow-up. Migraine. Difficulty moving left
arm/hand.



[Series 7: tof_fl3d_tra_iso · axial · 0.6mm · 0.52mm/px · z∈[-234,-154]mm · 9 of 133 slices shown]
[im 1/133]
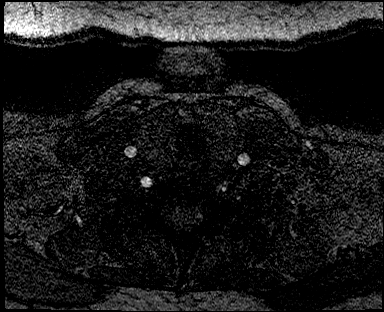
[im 17/133]
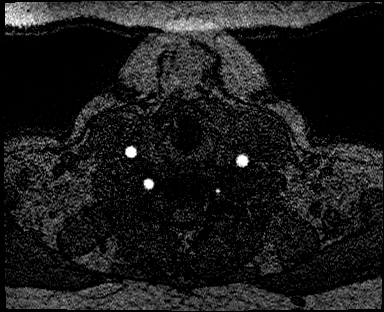
[im 34/133]
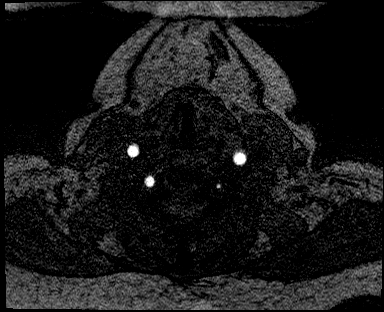
[im 50/133]
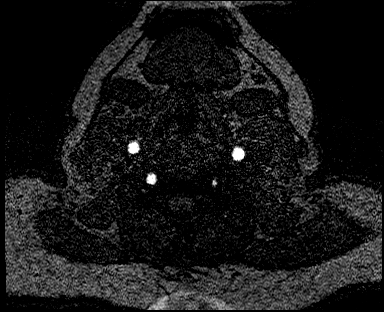
[im 67/133]
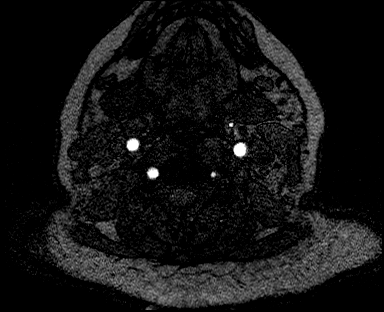
[im 83/133]
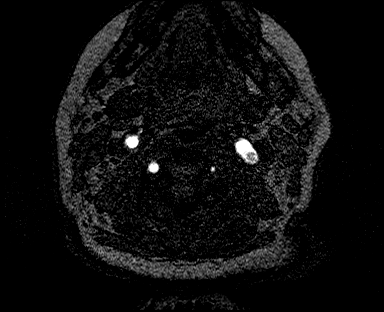
[im 100/133]
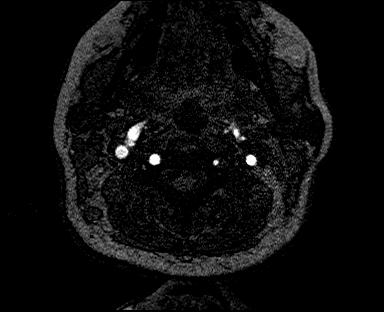
[im 116/133]
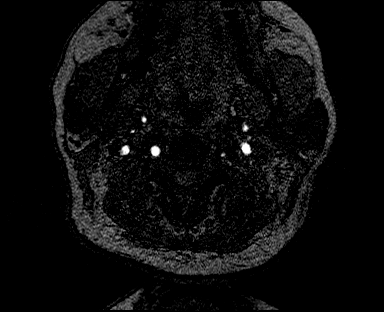
[im 133/133]
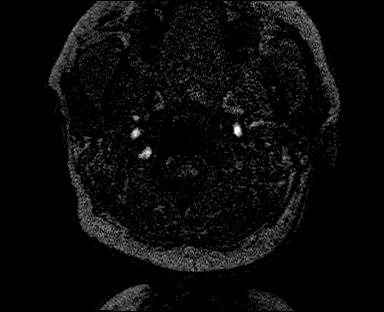

[Series 10: angio_fl3d_cor_pre_ttc=3.0s · coronal · 0.9mm · 0.85mm/px · 5 of 80 slices shown]
[im 1/80]
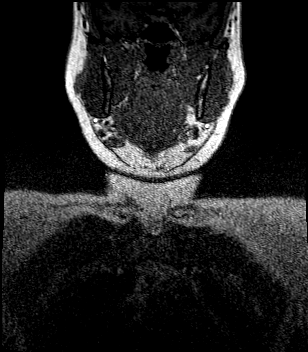
[im 20/80]
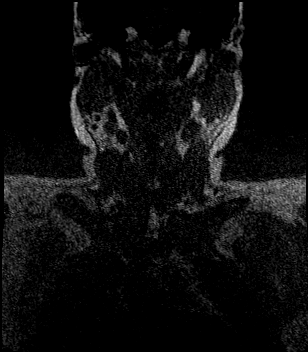
[im 40/80]
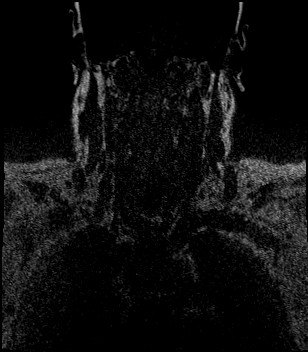
[im 60/80]
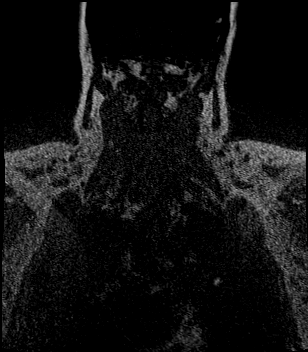
[im 80/80]
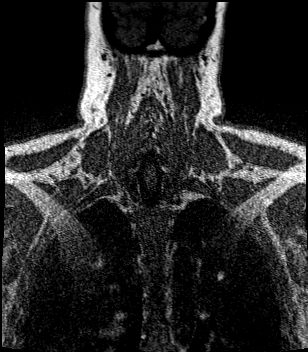

[Series 12: angio_fl3d_cor_post_ttc=3.0s · coronal · 0.9mm · 0.85mm/px · 5 of 80 slices shown (1 of 2)]
[im 1/80]
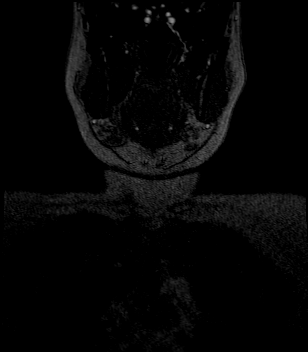
[im 20/80]
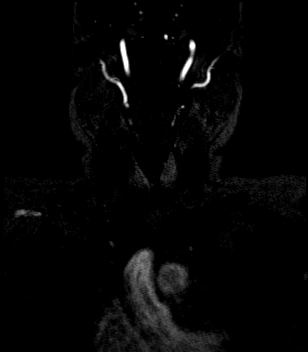
[im 40/80]
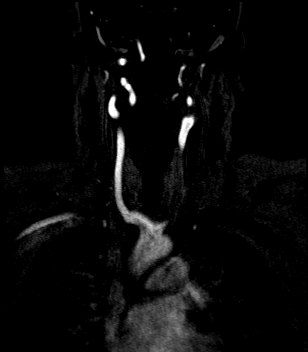
[im 60/80]
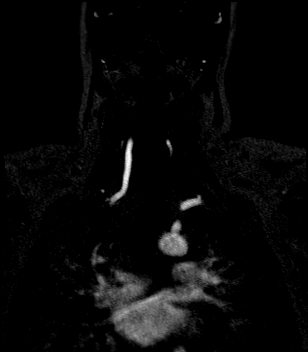
[im 80/80]
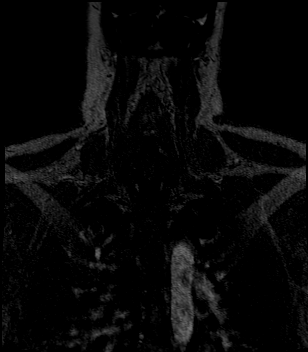

[Series 13: angio_fl3d_cor_post_ttc=3.0s_moco-adv · coronal · 0.9mm · 0.85mm/px · 5 of 80 slices shown (1 of 2)]
[im 1/80]
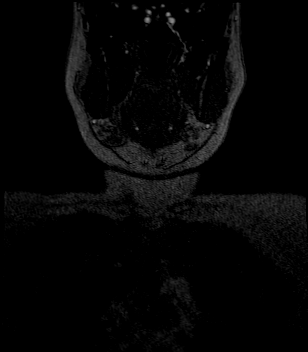
[im 20/80]
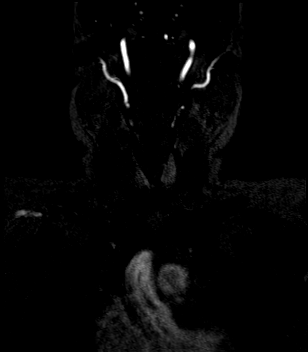
[im 40/80]
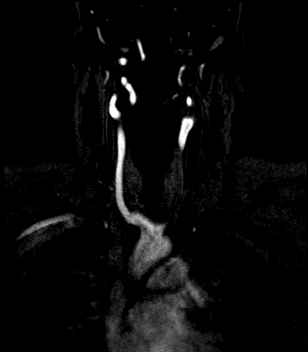
[im 60/80]
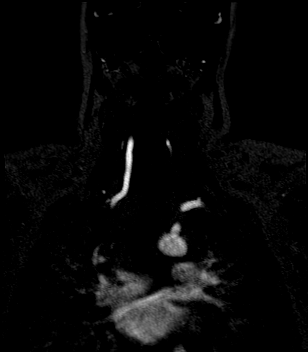
[im 80/80]
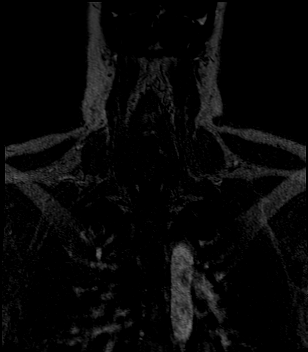

[Series 14: angio_fl3d_cor_post_ttc=3.0s_moco-adv_sub · coronal · 0.9mm · 0.85mm/px · 5 of 76 slices shown (1 of 2)]
[im 1/76]
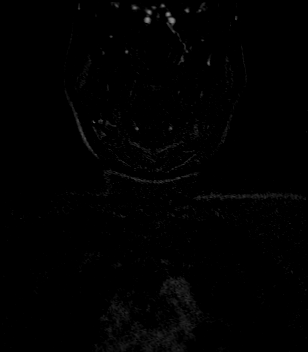
[im 19/76]
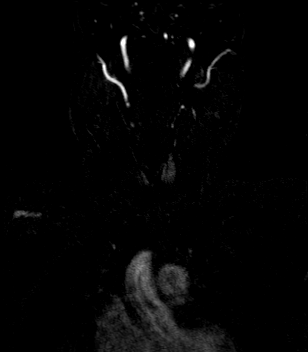
[im 38/76]
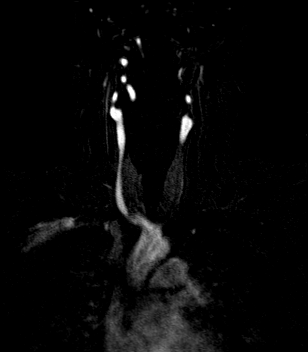
[im 57/76]
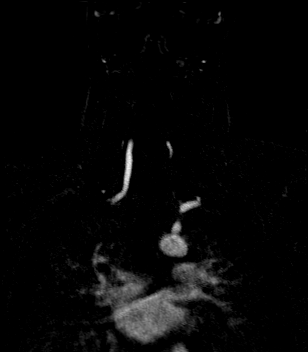
[im 76/76]
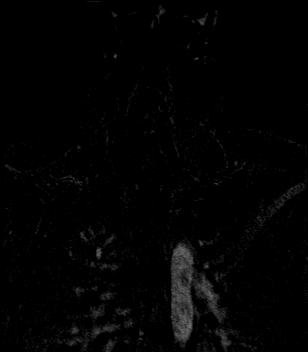

[Series 16: angio_fl3d_cor_post_ttc=3.0s · coronal · 0.9mm · 0.85mm/px · 5 of 80 slices shown (2 of 2)]
[im 1/80]
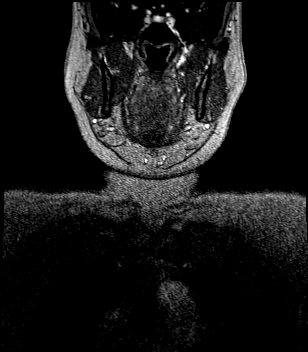
[im 20/80]
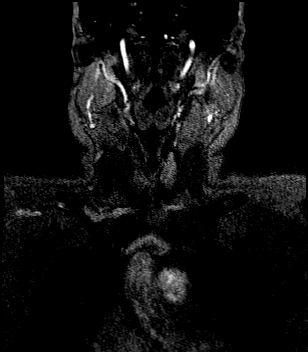
[im 40/80]
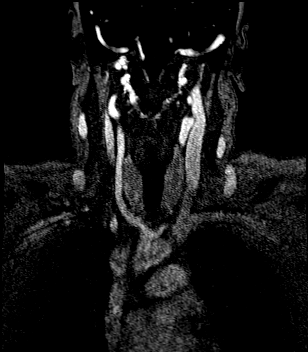
[im 60/80]
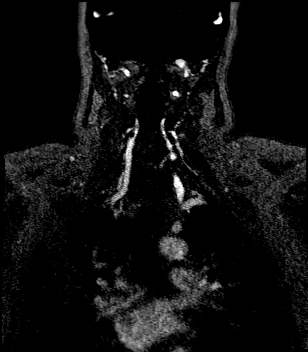
[im 80/80]
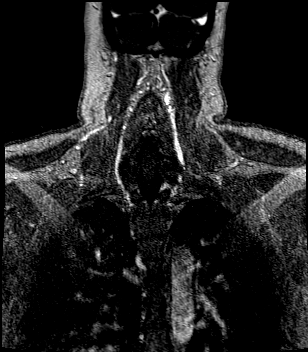

[Series 17: angio_fl3d_cor_post_ttc=3.0s_moco-adv · coronal · 0.9mm · 0.85mm/px · 5 of 80 slices shown (2 of 2)]
[im 1/80]
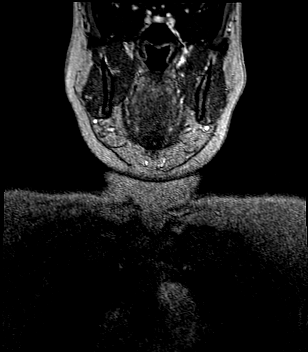
[im 20/80]
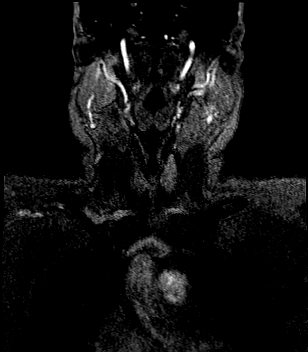
[im 40/80]
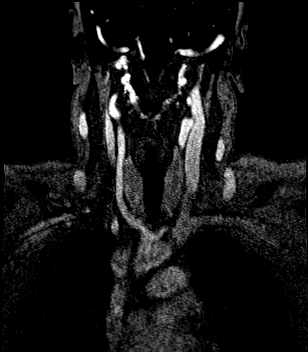
[im 60/80]
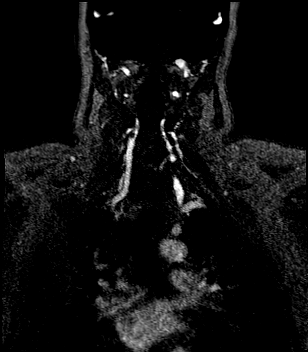
[im 80/80]
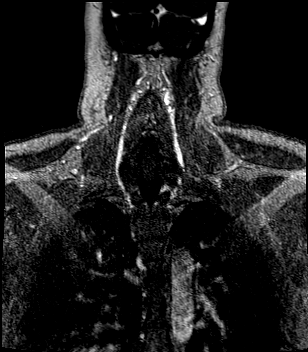

[Series 18: angio_fl3d_cor_post_ttc=3.0s_moco-adv_sub · coronal · 0.9mm · 0.85mm/px · 2 of 80 slices shown (2 of 2)]
[im 1/80]
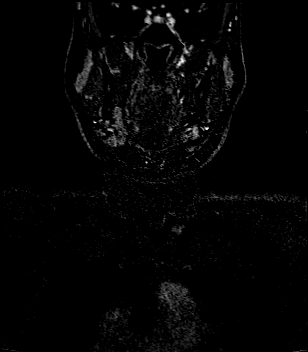
[im 20/80]
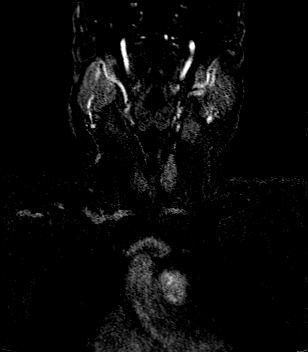

[41 of 48 positions shown; findings below may reference images not displayed]

FINDINGS: MRI HEAD FINDINGS

Brain: Small acute infarct in the precentral gyrus of the right
frontal lobe (series 5, image 87). Mild associated edema without
mass effect. Similar appearance of an old left parietal infarct. No
hydrocephalus. No acute hemorrhage. No mass lesion. No midline
shift.

Skull and upper cervical spine: No focal marrow replacing lesion.

Sinuses/Orbits: Small retention cysts in the left maxillary sinus.

MRA HEAD FINDINGS

No evidence of visible hemodynamically significant proximal stenosis
or large vessel occlusion in the anterior or posterior circulation.
Non dominant left vertebral artery appears to terminate as PICA,
similar to prior. No aneurysm identified.

MRA NECK FINDINGS

No evidence of hemodynamically significant stenosis or occlusion.
IMPRESSION: 1. Small acute infarct in the precentral gyrus of the right frontal
lobe. Mild associated edema without mass effect.
2. No large vessel occlusion or hemodynamically significant proximal
stenosis in the head or neck.

ADDENDUM:
Correction to the technique: MRA of the neck was without and with
contrast.

*** End of Addendum ***
FINDINGS: MRI HEAD FINDINGS

Brain: Small acute infarct in the precentral gyrus of the right
frontal lobe (series 5, image 87). Mild associated edema without
mass effect. Similar appearance of an old left parietal infarct. No
hydrocephalus. No acute hemorrhage. No mass lesion. No midline
shift.

Skull and upper cervical spine: No focal marrow replacing lesion.

Sinuses/Orbits: Small retention cysts in the left maxillary sinus.

MRA HEAD FINDINGS

No evidence of visible hemodynamically significant proximal stenosis
or large vessel occlusion in the anterior or posterior circulation.
Non dominant left vertebral artery appears to terminate as PICA,
similar to prior. No aneurysm identified.

MRA NECK FINDINGS

No evidence of hemodynamically significant stenosis or occlusion.
IMPRESSION: 1. Small acute infarct in the precentral gyrus of the right frontal
lobe. Mild associated edema without mass effect.
2. No large vessel occlusion or hemodynamically significant proximal
stenosis in the head or neck.

## 2020-06-04 IMAGING — MR MR HEAD W/O CM
12 of 13 series · 44 of 48 positions shown · non-contrast
Comparison: CTA [DATE].  MRI [DATE].
COMPARISON: CTA [DATE].  MRI [DATE].

Addendum:
CLINICAL DATA: Stroke follow-up. Migraine. Difficulty moving left
arm/hand.



[Series 5: DWI · axial · 3.0mm · 0.88mm/px · z∈[-157,-11]mm · 8 of 100 slices shown (1 of 4)]
[im 1/100]
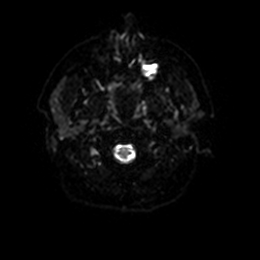
[im 15/100]
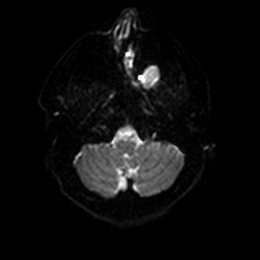
[im 29/100]
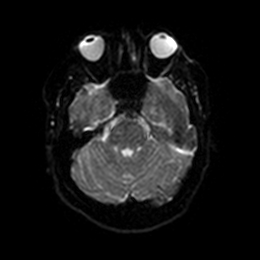
[im 43/100]
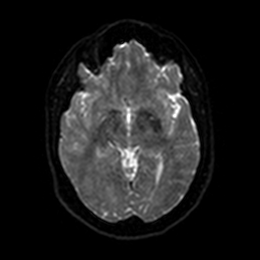
[im 57/100]
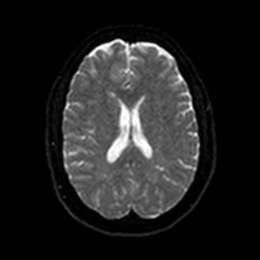
[im 71/100]
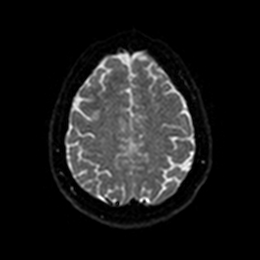
[im 85/100]
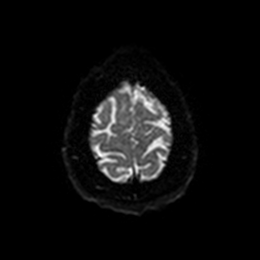
[im 100/100]
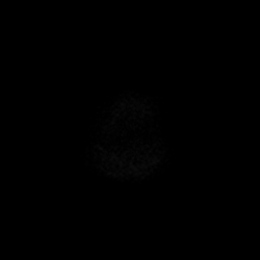

[Series 6: DWI · axial · 3.0mm · 0.88mm/px · z∈[-157,-11]mm · 4 of 50 slices shown (2 of 4)]
[im 1/50]
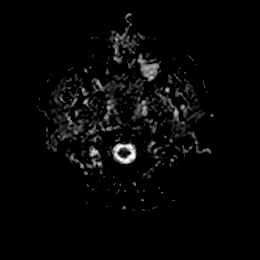
[im 17/50]
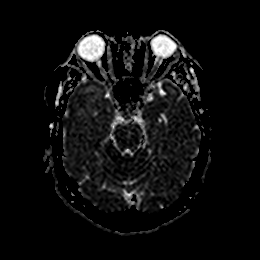
[im 33/50]
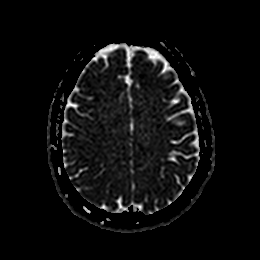
[im 50/50]
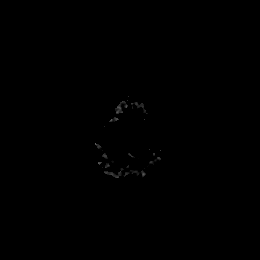

[Series 7: DWI · coronal · 4.0mm · 0.88mm/px · 6 of 72 slices shown (3 of 4)]
[im 1/72]
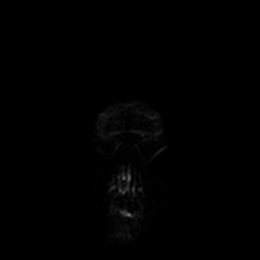
[im 15/72]
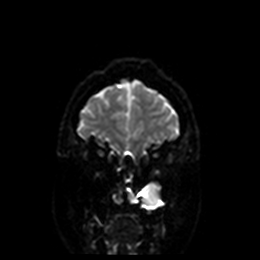
[im 29/72]
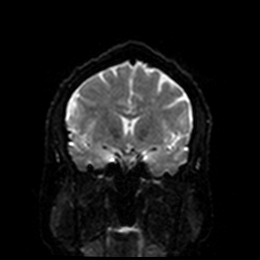
[im 43/72]
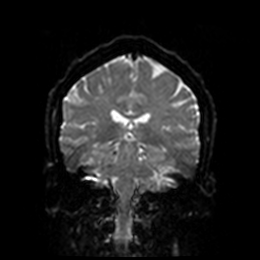
[im 57/72]
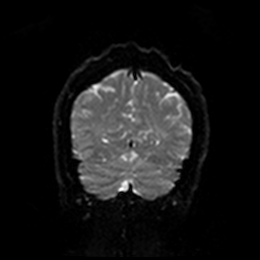
[im 72/72]
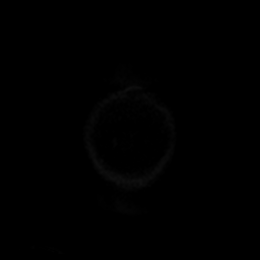

[Series 8: DWI · coronal · 4.0mm · 0.88mm/px · 3 of 36 slices shown (4 of 4)]
[im 1/36]
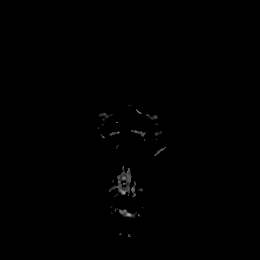
[im 18/36]
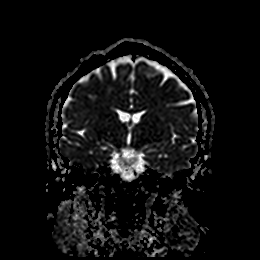
[im 36/36]
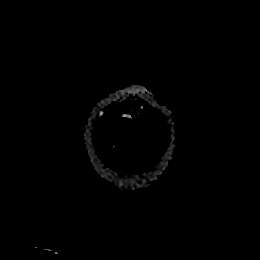

[Series 9: T1 · sagittal · 5.0mm · 0.75mm/px · 2 of 23 slices shown]
[im 1/23]
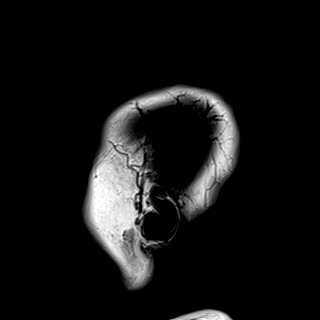
[im 23/23]
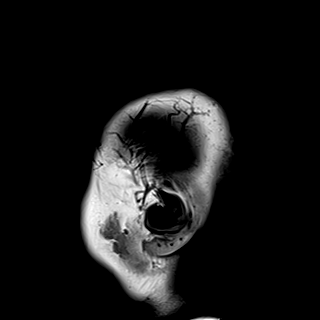

[Series 10: T2 · axial · 5.0mm · 0.72mm/px · z∈[-147,-4]mm · 2 of 25 slices shown (1 of 2)]
[im 1/25]
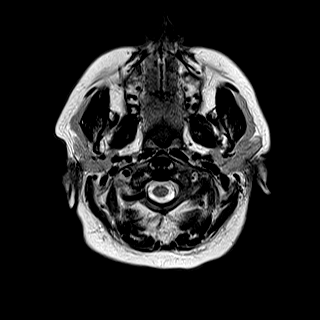
[im 25/25]
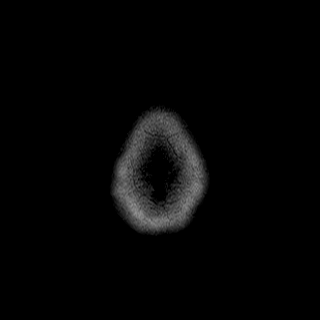

[Series 11: FLAIR · axial · 5.0mm · 0.45mm/px · z∈[-149,-5]mm · 2 of 25 slices shown]
[im 1/25]
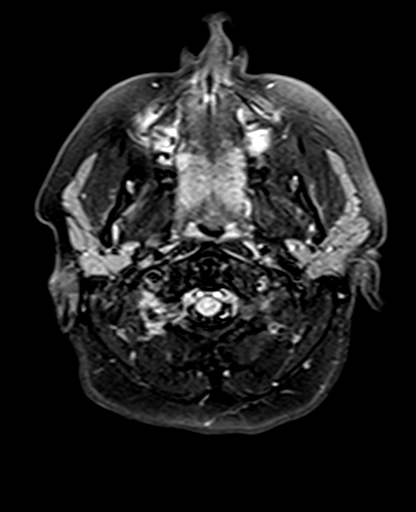
[im 25/25]
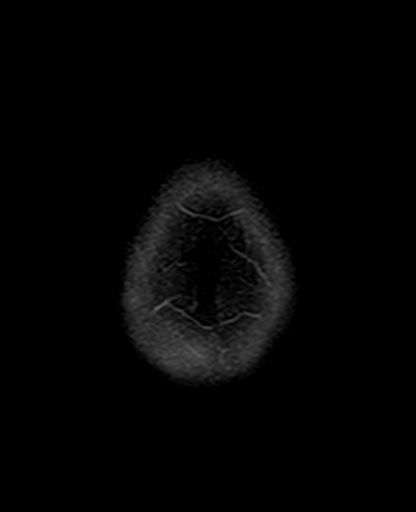

[Series 12: mag_images · axial · 3.0mm · 0.90mm/px · z∈[-147,-7]mm · 4 of 48 slices shown]
[im 1/48]
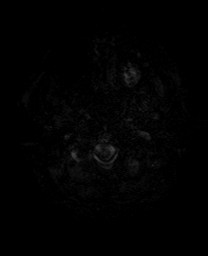
[im 16/48]
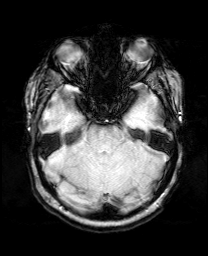
[im 32/48]
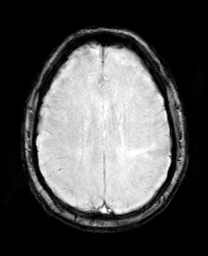
[im 48/48]
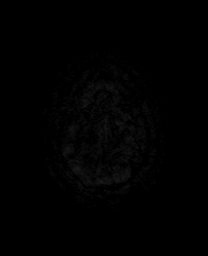

[Series 13: pha_images · axial · 3.0mm · 0.90mm/px · z∈[-147,-7]mm · 4 of 48 slices shown]
[im 1/48]
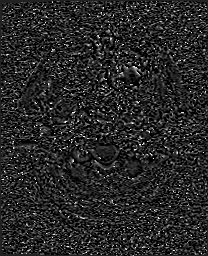
[im 16/48]
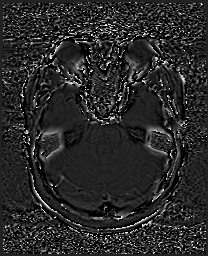
[im 32/48]
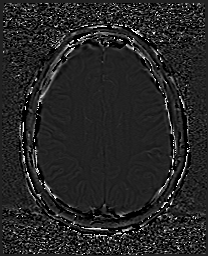
[im 48/48]
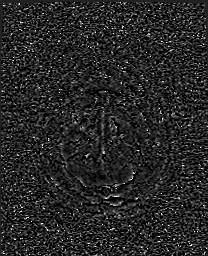

[Series 14: swi_images · axial · 3.0mm · 0.90mm/px · z∈[-147,-7]mm · 4 of 48 slices shown]
[im 1/48]
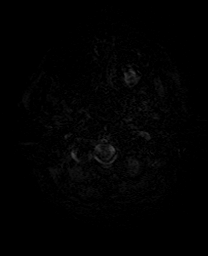
[im 16/48]
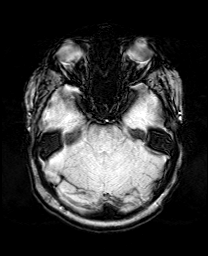
[im 32/48]
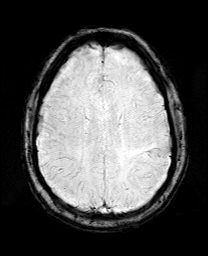
[im 48/48]
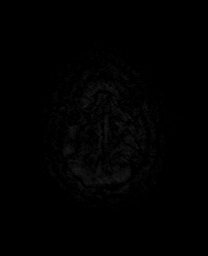

[Series 15: mip_images(sw) · axial · 24.0mm · 0.90mm/px · z∈[-137,-17]mm · 3 of 41 slices shown]
[im 1/41]
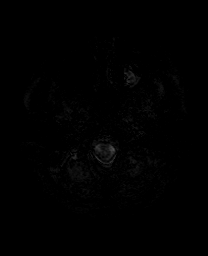
[im 21/41]
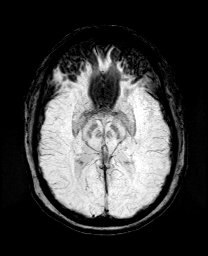
[im 41/41]
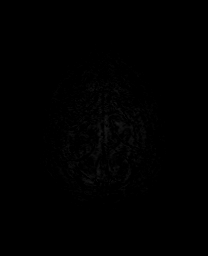

[Series 17: T2 · coronal · 5.0mm · 0.43mm/px · 2 of 29 slices shown (2 of 2)]
[im 1/29]
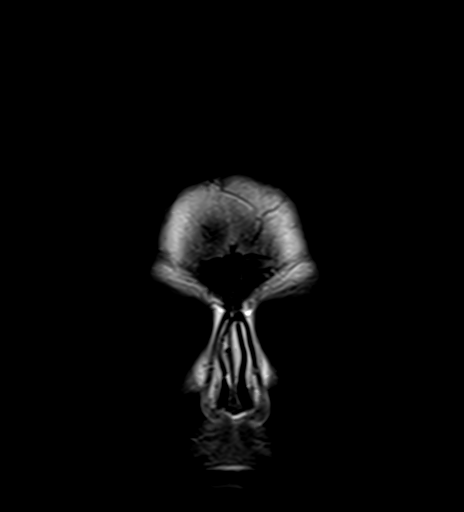
[im 29/29]
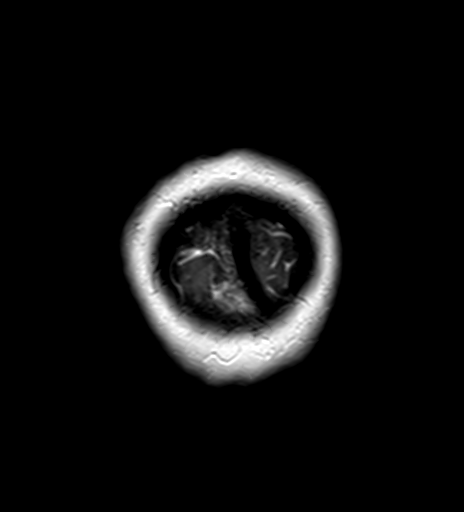

[44 of 48 positions shown; findings below may reference images not displayed]

FINDINGS: MRI HEAD FINDINGS

Brain: Small acute infarct in the precentral gyrus of the right
frontal lobe (series 5, image 87). Mild associated edema without
mass effect. Similar appearance of an old left parietal infarct. No
hydrocephalus. No acute hemorrhage. No mass lesion. No midline
shift.

Skull and upper cervical spine: No focal marrow replacing lesion.

Sinuses/Orbits: Small retention cysts in the left maxillary sinus.

MRA HEAD FINDINGS

No evidence of visible hemodynamically significant proximal stenosis
or large vessel occlusion in the anterior or posterior circulation.
Non dominant left vertebral artery appears to terminate as PICA,
similar to prior. No aneurysm identified.

MRA NECK FINDINGS

No evidence of hemodynamically significant stenosis or occlusion.
IMPRESSION: 1. Small acute infarct in the precentral gyrus of the right frontal
lobe. Mild associated edema without mass effect.
2. No large vessel occlusion or hemodynamically significant proximal
stenosis in the head or neck.

ADDENDUM:
Correction to the technique: MRA of the neck was without and with
contrast.

*** End of Addendum ***
FINDINGS: MRI HEAD FINDINGS

Brain: Small acute infarct in the precentral gyrus of the right
frontal lobe (series 5, image 87). Mild associated edema without
mass effect. Similar appearance of an old left parietal infarct. No
hydrocephalus. No acute hemorrhage. No mass lesion. No midline
shift.

Skull and upper cervical spine: No focal marrow replacing lesion.

Sinuses/Orbits: Small retention cysts in the left maxillary sinus.

MRA HEAD FINDINGS

No evidence of visible hemodynamically significant proximal stenosis
or large vessel occlusion in the anterior or posterior circulation.
Non dominant left vertebral artery appears to terminate as PICA,
similar to prior. No aneurysm identified.

MRA NECK FINDINGS

No evidence of hemodynamically significant stenosis or occlusion.
IMPRESSION: 1. Small acute infarct in the precentral gyrus of the right frontal
lobe. Mild associated edema without mass effect.
2. No large vessel occlusion or hemodynamically significant proximal
stenosis in the head or neck.

## 2020-06-04 IMAGING — MR MR MRA HEAD W/O CM
1 series · 18 of 48 positions shown · non-contrast
Comparison: CTA [DATE].  MRI [DATE].
COMPARISON: CTA [DATE].  MRI [DATE].

Addendum:
CLINICAL DATA: Stroke follow-up. Migraine. Difficulty moving left
arm/hand.



[Series 1: 3d cow · axial · 0.5mm · 0.41mm/px · z∈[-149,-77]mm · 18 of 152 slices shown]
[im 1/152]
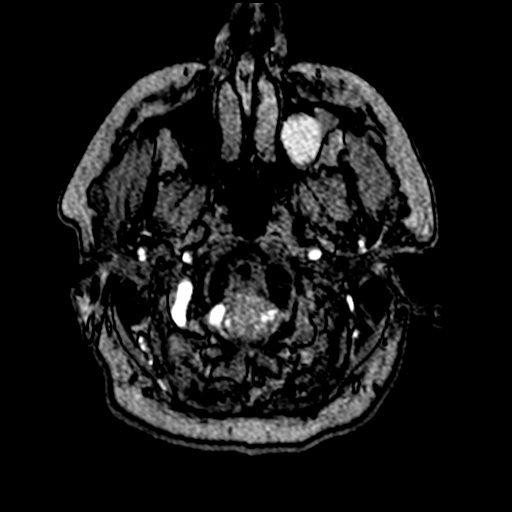
[im 4/152]
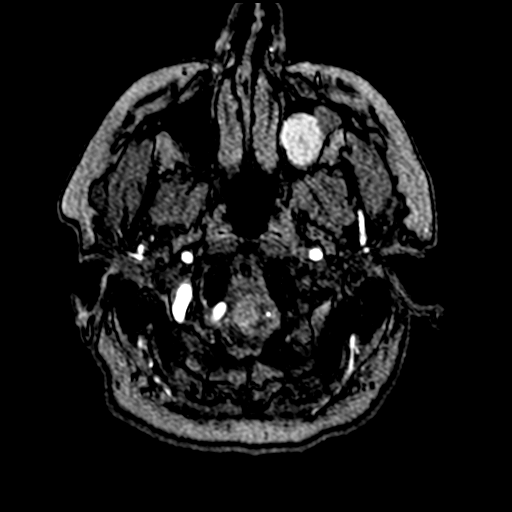
[im 7/152]
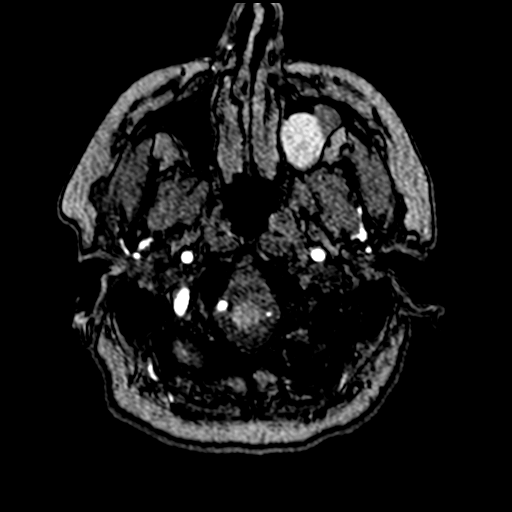
[im 10/152]
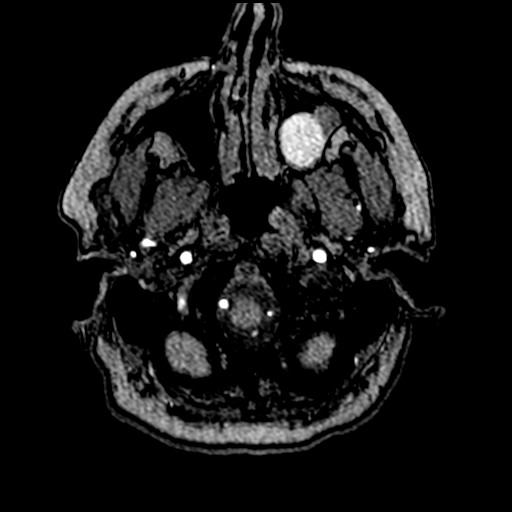
[im 13/152]
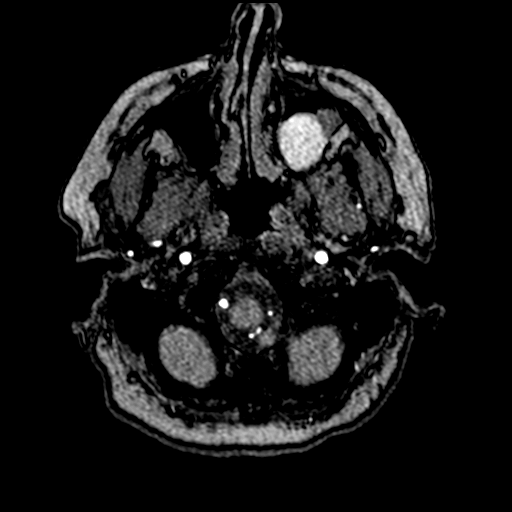
[im 17/152]
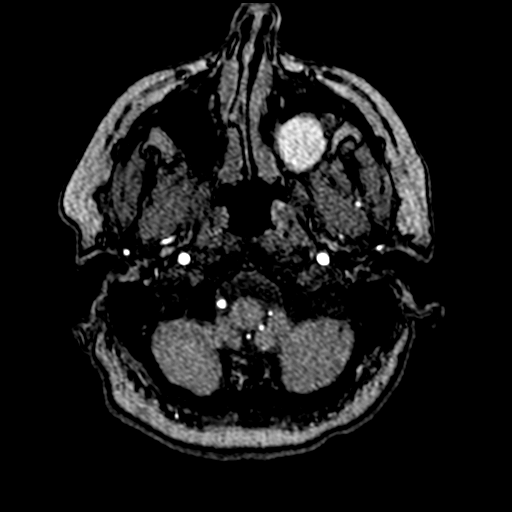
[im 20/152]
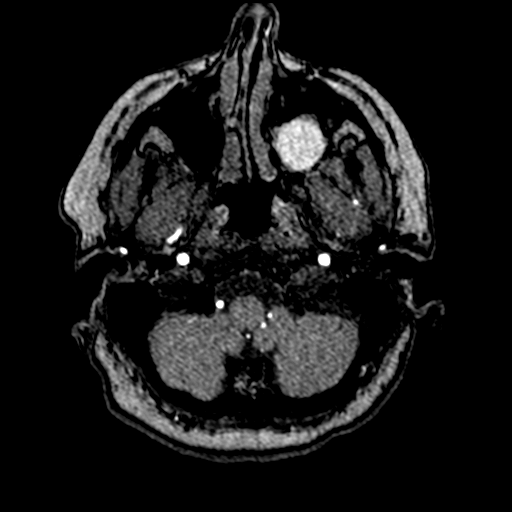
[im 23/152]
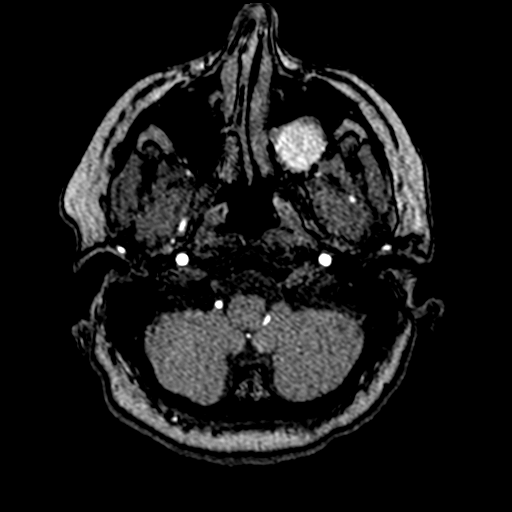
[im 26/152]
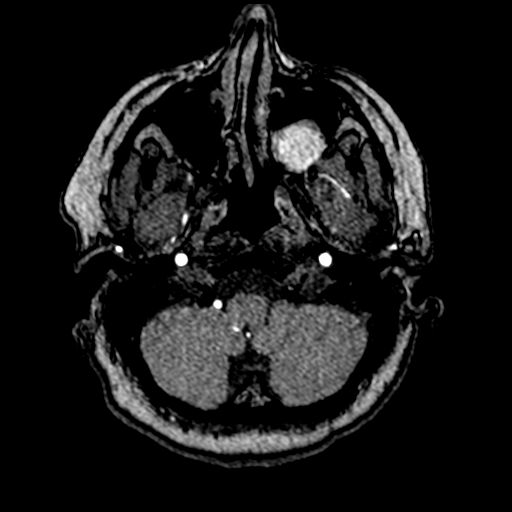
[im 29/152]
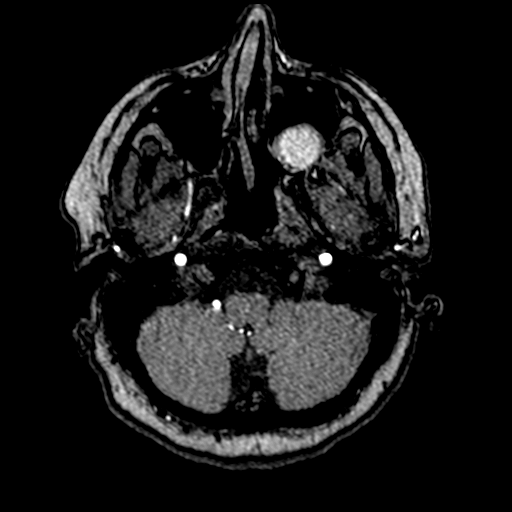
[im 49/152]
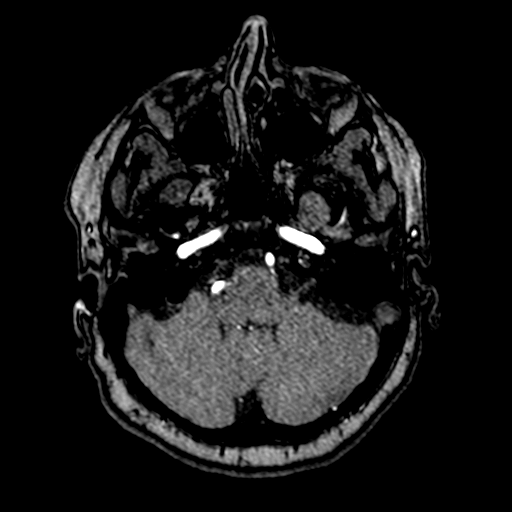
[im 68/152]
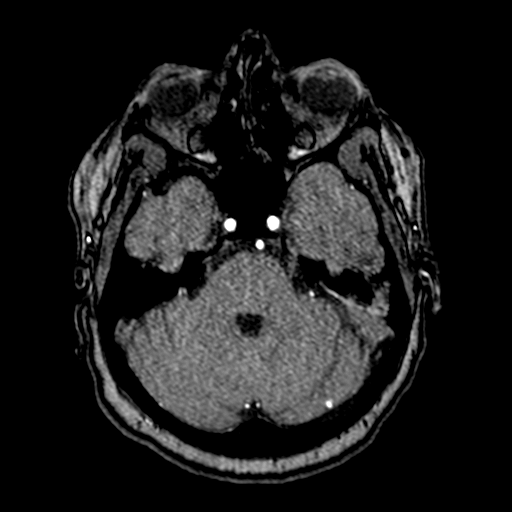
[im 78/152]
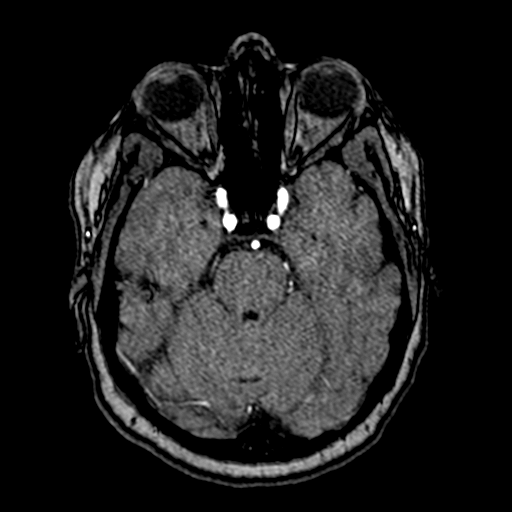
[im 87/152]
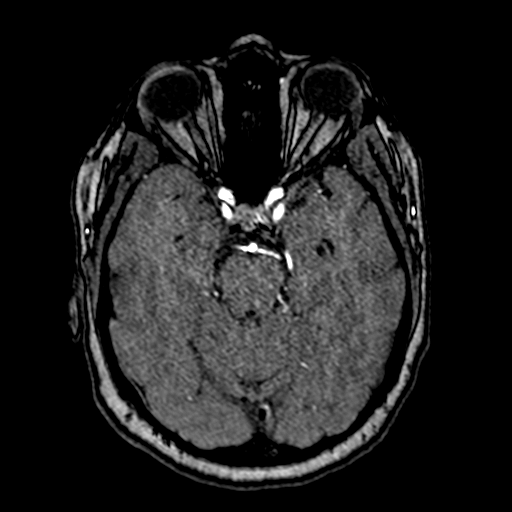
[im 107/152]
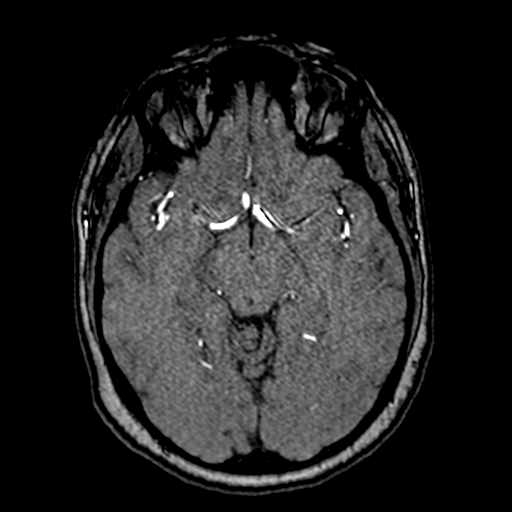
[im 126/152]
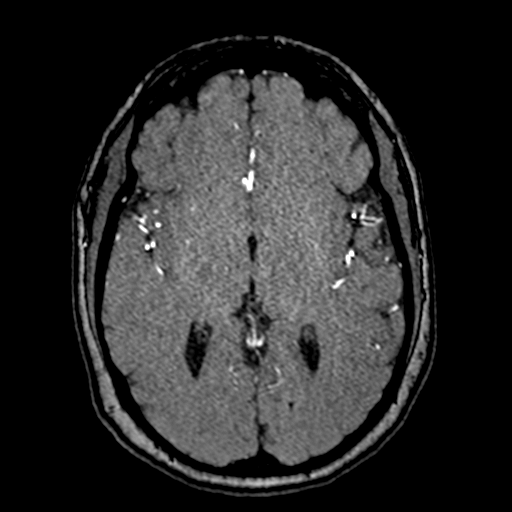
[im 129/152]
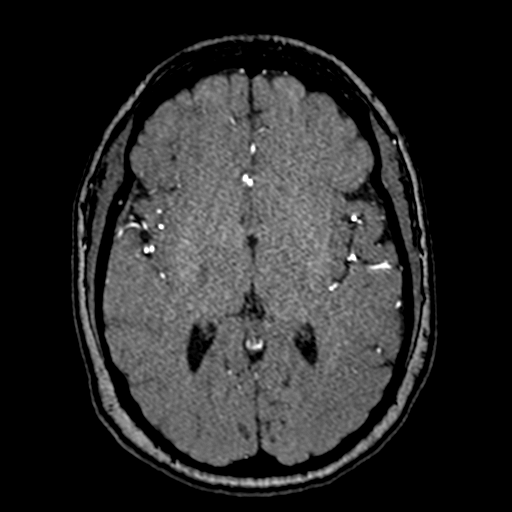
[im 145/152]
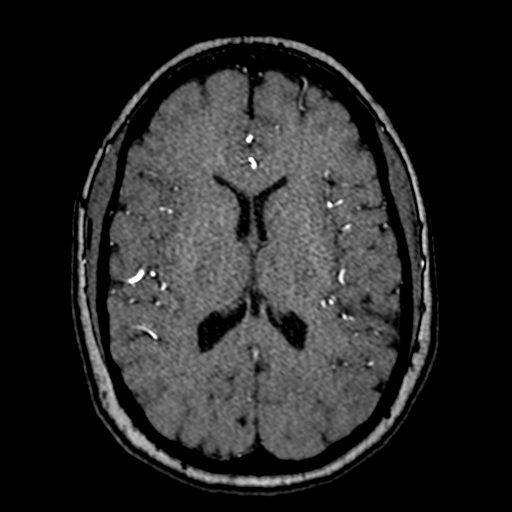

[18 of 48 positions shown; findings below may reference images not displayed]

FINDINGS: MRI HEAD FINDINGS

Brain: Small acute infarct in the precentral gyrus of the right
frontal lobe (series 5, image 87). Mild associated edema without
mass effect. Similar appearance of an old left parietal infarct. No
hydrocephalus. No acute hemorrhage. No mass lesion. No midline
shift.

Skull and upper cervical spine: No focal marrow replacing lesion.

Sinuses/Orbits: Small retention cysts in the left maxillary sinus.

MRA HEAD FINDINGS

No evidence of visible hemodynamically significant proximal stenosis
or large vessel occlusion in the anterior or posterior circulation.
Non dominant left vertebral artery appears to terminate as PICA,
similar to prior. No aneurysm identified.

MRA NECK FINDINGS

No evidence of hemodynamically significant stenosis or occlusion.
IMPRESSION: 1. Small acute infarct in the precentral gyrus of the right frontal
lobe. Mild associated edema without mass effect.
2. No large vessel occlusion or hemodynamically significant proximal
stenosis in the head or neck.

ADDENDUM:
Correction to the technique: MRA of the neck was without and with
contrast.

*** End of Addendum ***
FINDINGS: MRI HEAD FINDINGS

Brain: Small acute infarct in the precentral gyrus of the right
frontal lobe (series 5, image 87). Mild associated edema without
mass effect. Similar appearance of an old left parietal infarct. No
hydrocephalus. No acute hemorrhage. No mass lesion. No midline
shift.

Skull and upper cervical spine: No focal marrow replacing lesion.

Sinuses/Orbits: Small retention cysts in the left maxillary sinus.

MRA HEAD FINDINGS

No evidence of visible hemodynamically significant proximal stenosis
or large vessel occlusion in the anterior or posterior circulation.
Non dominant left vertebral artery appears to terminate as PICA,
similar to prior. No aneurysm identified.

MRA NECK FINDINGS

No evidence of hemodynamically significant stenosis or occlusion.
IMPRESSION: 1. Small acute infarct in the precentral gyrus of the right frontal
lobe. Mild associated edema without mass effect.
2. No large vessel occlusion or hemodynamically significant proximal
stenosis in the head or neck.

## 2020-06-04 MED ORDER — ASPIRIN EC 81 MG PO TBEC
81.0000 mg | DELAYED_RELEASE_TABLET | Freq: Every day | ORAL | Status: DC
  Administered 2020-06-04 – 2020-06-06 (×3): 81 mg via ORAL
  Filled 2020-06-04 (×3): qty 1

## 2020-06-04 MED ORDER — GADOBUTROL 1 MMOL/ML IV SOLN
9.0000 mL | Freq: Once | INTRAVENOUS | Status: AC | PRN
  Administered 2020-06-04: 9 mL via INTRAVENOUS

## 2020-06-04 MED ORDER — ATORVASTATIN CALCIUM 10 MG PO TABS
20.0000 mg | ORAL_TABLET | Freq: Every day | ORAL | Status: DC
  Administered 2020-06-04 – 2020-06-06 (×3): 20 mg via ORAL
  Filled 2020-06-04 (×3): qty 2

## 2020-06-04 MED ORDER — CLOPIDOGREL BISULFATE 75 MG PO TABS
75.0000 mg | ORAL_TABLET | Freq: Every day | ORAL | Status: DC
  Administered 2020-06-04 – 2020-06-06 (×3): 75 mg via ORAL
  Filled 2020-06-04 (×3): qty 1

## 2020-06-04 NOTE — Evaluation (Signed)
Speech Language Pathology Evaluation Patient Details Name: [name] MRN: [id number] DOB: 1971/06/21 Today's Date: 06/04/2020 Time: 9357-0177 SLP Time Calculation (min) (ACUTE ONLY): 23 min  Problem List:  Patient Active Problem List   Diagnosis Date Noted  . Received intravenous tissue plasminogen activator (tPA) in emergency department   . Acute ischemic stroke (HCC) 03/11/2020  . Hypothyroid 12/22/2011  . Stress incontinence 12/22/2011  . Family history of kidney disease 12/22/2011  . Routine gynecological examination 12/22/2011  . Routine general medical examination at a health care facility 12/19/2011  . SHOULDER PAIN, RIGHT 10/03/2008   Past Medical History:  Past Medical History:  Diagnosis Date  . No diagnosis    Past Surgical History:  Past Surgical History:  Procedure Laterality Date  . BUBBLE STUDY  03/13/2020   Procedure: BUBBLE STUDY;  Surgeon: Quintella Reichert, MD;  Location: [facility] ENDOSCOPY;  Service: Cardiovascular;;  . ENDOMETRIAL ABLATION     Uterine/heavy menses  . SHOULDER SURGERY     Left,/bone spurs  . TEE WITHOUT CARDIOVERSION N/A 03/13/2020   Procedure: TRANSESOPHAGEAL ECHOCARDIOGRAM (TEE);  Surgeon: Quintella Reichert, MD;  Location: [facility] ENDOSCOPY;  Service: Cardiovascular;  Laterality: N/A;   HPI:  Pt is a 49 yo L-hand dominant female presenting with LUE weakness/numbness. She had an MRI a few days ago due to word-finding difficulties, but this was normal. PMH includes stroke (R parietal occiptal and L frontal infarcts with resolution of symptoms). Pt scored 25/30 on the SLUMS in 03/2020 but was felt to be at her baseline at that time.    Assessment / Plan / Recommendation Clinical Impression  Pt received a score of 30/30 on the SLUMS, which is actually improved from previous admission. She and her husband deny any acute changes this admission. They do describe mild word-finding errors that they noticed after her last stroke in September, that have remained  stable since that time. Education was provided on the availability of OP SLP to address this. At this time they seem to feel like she is able to compensate well for this and that it does not necessarily interfere with daily responsibilities, but this could be an option if the choose to pursue it. SLP will s/o acutely.     SLP Assessment  SLP Recommendation/Assessment: All further Speech Lanaguage Pathology  needs can be addressed in the next venue of care SLP Visit Diagnosis: Cognitive communication deficit (R41.841)    Follow Up Recommendations  Outpatient SLP (if pt wants for word-finding difficulties PTA)    Frequency and Duration           SLP Evaluation Cognition  Overall Cognitive Status: Within Functional Limits for tasks assessed Orientation Level: Oriented X4       Comprehension  Auditory Comprehension Overall Auditory Comprehension: Appears within functional limits for tasks assessed    Expression Expression Primary Mode of Expression: Verbal Verbal Expression Overall Verbal Expression: Appears within functional limits for tasks assessed   Oral / Motor  Motor Speech Overall Motor Speech: Appears within functional limits for tasks assessed   GO                    Mahala Menghini., M.A. CCC-SLP Acute Rehabilitation Services Pager [phone] Office [phone]  06/04/2020, 9:31 AM

## 2020-06-04 NOTE — Evaluation (Signed)
Physical Therapy Evaluation Patient Details Name: [name] MRN: [id number] DOB: [birth year] Today's Date: 06/04/2020   History of Present Illness  Pt is a 49 yo L-hand dominant female presenting with LUE weakness/numbness. She had an MRI a few days ago due to word-finding difficulties, but this was normal. PMH includes stroke (R parietal occiptal and L frontal infarcts with resolution of symptoms). Pt scored 25/30 on the SLUMS in 03/2020 but was felt to be at her baseline at that time.    Clinical Impression  Pt is close to baseline functioning and should be safe at home and back to work when medically cleared. There are no further acute PT needs.  Will sign off at this time.     Follow Up Recommendations No PT follow up    Equipment Recommendations  None recommended by PT    Recommendations for Other Services       Precautions / Restrictions Precautions Precautions: None Restrictions Weight Bearing Restrictions: No      Mobility  Bed Mobility Overal bed mobility: Independent                  Transfers Overall transfer level: Independent                  Ambulation/Gait Ambulation/Gait assistance: Independent Gait Distance (Feet): 500 Feet Assistive device: None Gait Pattern/deviations: WFL(Within Functional Limits) Gait velocity: age approp      Stairs            Wheelchair Mobility    Modified Rankin (Stroke Patients Only) Modified Rankin (Stroke Patients Only) Pre-Morbid Rankin Score: No symptoms Modified Rankin: No significant disability     Balance Overall balance assessment: Independent                                           Pertinent Vitals/Pain Pain Assessment: No/denies pain Faces Pain Scale: No hurt    Home Living Family/patient expects to be discharged to:: Private residence Living Arrangements: Spouse/significant other Available Help at Discharge: Family;Available 24 hours/day Type of Home:  House Home Access: Stairs to enter Entrance Stairs-Rails: None Entrance Stairs-Number of Steps: 4 Home Layout: Two level;Able to live on main level with bedroom/bathroom Home Equipment: Cane - single point;Crutches;Shower seat - built in      Prior Function Level of Independence: Independent         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 [employer]   Dominant Hand: Left    Extremity/Trunk Assessment   Upper Extremity Assessment Upper Extremity Assessment: Defer to OT evaluation    Lower Extremity Assessment Lower Extremity Assessment: Overall WFL for tasks assessed    Cervical / Trunk Assessment Cervical / Trunk Assessment: Normal  Communication   Communication: No difficulties  Cognition Arousal/Alertness: Awake/alert Behavior During Therapy: WFL for tasks assessed/performed Overall Cognitive Status: Within Functional Limits for tasks assessed                                        General Comments General comments (skin integrity, edema, etc.): vss    Exercises     Assessment/Plan    PT Assessment Patent does not need any further PT services  PT Problem List  PT Treatment Interventions      PT Goals (Current goals can be found in the Care Plan section)  Acute Rehab PT Goals Patient Stated Goal: home soon PT Goal Formulation: All assessment and education complete, DC therapy    Frequency     Barriers to discharge        Co-evaluation               AM-PAC PT "6 Clicks" Mobility  Outcome Measure Help needed turning from your back to your side while in a flat bed without using bedrails?: None Help needed moving from lying on your back to sitting on the side of a flat bed without using bedrails?: None Help needed moving to and from a bed to a chair (including a wheelchair)?: None Help needed standing up from a chair using your arms (e.g., wheelchair or bedside chair)?:  None Help needed to walk in hospital room?: None Help needed climbing 3-5 steps with a railing? : None 6 Click Score: 24    End of Session   Activity Tolerance: Patient tolerated treatment well Patient left: in chair;with call bell/phone within reach Nurse Communication: Mobility status PT Visit Diagnosis: Other symptoms and signs involving the nervous system (R29.898)    Time: 5784-6962 PT Time Calculation (min) (ACUTE ONLY): 24 min   Charges:   PT Evaluation $PT Eval Low Complexity: 1 Low          06/04/2020  Jacinto Halim., PT Acute Rehabilitation Services [phone]  (pager) [phone]  (office)  Eliseo Gum Ahnesti Townsend 06/04/2020, 10:37 AM

## 2020-06-04 NOTE — Progress Notes (Signed)
STROKE TEAM PROGRESS NOTE   INTERVAL HISTORY Husband at bedside.  Patient recounted HPI with me.  She had acute onset with left forearm left hand numbness and then developed difficulty writing or typing with left hand.  She is left-handed.  She came to ER and received a TPA for possible stroke.  In 04/2020 she had intermittent speech difficulty, and episodic headache twice per week which were not same type of headache with her previous menstrual migraine.  Loop recorder placed 2 weeks ago, currently no A. fib interrogated.  Currently patient neurological symptoms resolved.  Vitals:   06/04/20 0500 06/04/20 0600 06/04/20 0700 06/04/20 0800  BP: 106/64 (!) 96/48 104/67 108/69  Pulse: 70 (!) 58 68 65  Resp: 18 14 16 14   Temp:    97.8 F (36.6 C)  TempSrc:    Oral  SpO2: 96% 97% 98% 97%  Weight:       CBC:  Recent Labs  Lab 06/03/20 1446 06/03/20 1458  WBC 7.2  --   NEUTROABS 4.2  --   HGB 13.6 13.6  HCT 40.1 40.0  MCV 94.6  --   PLT 277  --    Basic Metabolic Panel:  Recent Labs  Lab 06/03/20 1446 06/03/20 1458  NA 139 141  K 3.5 3.5  CL 104 103  CO2 23  --   GLUCOSE 113* 108*  BUN 7 6  CREATININE 0.81 0.70  CALCIUM 9.1  --    Lipid Panel:  Recent Labs  Lab 06/04/20 0355  CHOL 136  TRIG 109  HDL 50  CHOLHDL 2.7  VLDL 22  LDLCALC 64   HgbA1c:  Recent Labs  Lab 06/04/20 0355  HGBA1C 5.3   Urine Drug Screen: No results for input(s): LABOPIA, COCAINSCRNUR, LABBENZ, AMPHETMU, THCU, LABBARB in the last 168 hours.  Alcohol Level No results for input(s): ETH in the last 168 hours.  IMAGING past 24 hours DG Chest 1 View  Result Date: 06/03/2020 CLINICAL DATA:  Left upper extremity weakness and tingling EXAM: CHEST  1 VIEW COMPARISON:  05/23/2020 FINDINGS: Single frontal view of the chest demonstrates a stable cardiac silhouette. Loop recorder is seen within the left anterior chest wall. No airspace disease, effusion, or pneumothorax. No acute bony  abnormalities. IMPRESSION: 1. No acute intrathoracic process. Electronically Signed   By: Sharlet SalinaMichael  Brown M.D.   On: 06/03/2020 15:57   ECHOCARDIOGRAM COMPLETE  Result Date: 06/03/2020    ECHOCARDIOGRAM REPORT   Patient Name:   Simmie L Hughett Date of Exam: 06/03/2020 Medical Rec #:  [id number]      Height:       65.0 in Accession #:    [id number]     Weight:       198.4 lb Date of Birth:  [birth year]      BSA:          1.971 m Patient Age:    49 years       BP:           103/72 mmHg Patient Gender: F              HR:           57 bpm. Exam Location:  Inpatient Procedure: 2D Echo, Cardiac Doppler, Color Doppler and Intracardiac            Opacification Agent Indications:    Stroke  History:        Patient has prior history of Echocardiogram examinations, most  recent 03/13/2020. Stroke. PFO closure 30 years ago.  Sonographer:    Sheralyn Boatman RDCS Referring Phys: [id number] ASHISH ARORA  Sonographer Comments: Technically difficult study due to poor echo windows, suboptimal parasternal window and suboptimal apical window. Loop recorder in parasternal window. IMPRESSIONS  1. Left ventricular ejection fraction, by estimation, is 60 to 65%. The left ventricle has normal function. The left ventricle has no regional wall motion abnormalities. Left ventricular diastolic parameters were normal.  2. Right ventricular systolic function is low normal. The right ventricular size is mildly enlarged. There is normal pulmonary artery systolic pressure.  3. Right atrial size was mildly dilated.  4. The mitral valve is grossly normal. Trivial mitral valve regurgitation.  5. The aortic valve is grossly normal. Aortic valve regurgitation is not visualized. No aortic stenosis is present.  6. The inferior vena cava is normal in size with greater than 50% respiratory variability, suggesting right atrial pressure of 3 mmHg. Comparison(s): No significant change from prior study. Conclusion(s)/Recommendation(s): Otherwise normal  echocardiogram, with minor abnormalities described in the report. No intracardiac source of embolism detected on this transthoracic study. A transesophageal echocardiogram is recommended to exclude cardiac source of embolism if clinically indicated. FINDINGS  Left Ventricle: Left ventricular ejection fraction, by estimation, is 60 to 65%. The left ventricle has normal function. The left ventricle has no regional wall motion abnormalities. Definity contrast agent was given IV to delineate the left ventricular  endocardial borders. The left ventricular internal cavity size was normal in size. There is no left ventricular hypertrophy. Left ventricular diastolic parameters were normal. Right Ventricle: The right ventricular size is mildly enlarged. Right vetricular wall thickness was not well visualized. Right ventricular systolic function is low normal. There is normal pulmonary artery systolic pressure. The tricuspid regurgitant velocity is 2.17 m/s, and with an assumed right atrial pressure of 3 mmHg, the estimated right ventricular systolic pressure is 21.8 mmHg. Left Atrium: Left atrial size was normal in size. Right Atrium: Right atrial size was mildly dilated. Pericardium: There is no evidence of pericardial effusion. Mitral Valve: The mitral valve is grossly normal. Trivial mitral valve regurgitation. Tricuspid Valve: The tricuspid valve is normal in structure. Tricuspid valve regurgitation is mild . No evidence of tricuspid stenosis. Aortic Valve: The aortic valve is grossly normal. Aortic valve regurgitation is not visualized. No aortic stenosis is present. Pulmonic Valve: The pulmonic valve was not well visualized. Pulmonic valve regurgitation is not visualized. Aorta: The aortic root, ascending aorta, aortic arch and descending aorta are all structurally normal, with no evidence of dilitation or obstruction. Venous: The inferior vena cava is normal in size with greater than 50% respiratory variability,  suggesting right atrial pressure of 3 mmHg. IAS/Shunts: The interatrial septum was not well visualized.  LEFT VENTRICLE PLAX 2D LVIDd:         4.10 cm     Diastology LVIDs:         2.80 cm     LV e' medial:    9.14 cm/s LV PW:         0.90 cm     LV E/e' medial:  7.7 LV IVS:        0.90 cm     LV e' lateral:   9.57 cm/s LVOT diam:     1.80 cm     LV E/e' lateral: 7.4 LV SV:         57 LV SV Index:   29 LVOT Area:     2.54 cm  LV Volumes (MOD) LV vol d, MOD A2C: 75.2 ml LV vol d, MOD A4C: 86.9 ml LV vol s, MOD A2C: 27.6 ml LV vol s, MOD A4C: 29.8 ml LV SV MOD A2C:     47.5 ml LV SV MOD A4C:     86.9 ml LV SV MOD BP:      52.1 ml RIGHT VENTRICLE            IVC RV S prime:     7.83 cm/s  IVC diam: 1.50 cm TAPSE (M-mode): 1.6 cm LEFT ATRIUM             Index      RIGHT ATRIUM           Index LA diam:        3.40 cm 1.72 cm/m RA Area:     15.50 cm LA Vol (A2C):   19.1 ml 9.69 ml/m RA Volume:   42.10 ml  21.35 ml/m LA Vol (A4C):   17.0 ml 8.62 ml/m LA Biplane Vol: 18.8 ml 9.54 ml/m  AORTIC VALVE LVOT Vmax:   106.00 cm/s LVOT Vmean:  62.400 cm/s LVOT VTI:    0.224 m  AORTA Ao Root diam: 2.40 cm MITRAL VALVE               TRICUSPID VALVE MV Area (PHT): 2.99 cm    TR Peak grad:   18.8 mmHg MV Decel Time: 254 msec    TR Vmax:        217.00 cm/s MV E velocity: 70.70 cm/s MV A velocity: 61.70 cm/s  SHUNTS MV E/A ratio:  1.15        Systemic VTI:  0.22 m                            Systemic Diam: 1.80 cm Jodelle Red MD Electronically signed by Jodelle Red MD Signature Date/Time: 06/03/2020/7:59:28 PM    Final    CT HEAD CODE STROKE WO CONTRAST  Result Date: 06/03/2020 CLINICAL DATA:  Code stroke.  Left-sided weakness EXAM: CT HEAD WITHOUT CONTRAST TECHNIQUE: Contiguous axial images were obtained from the base of the skull through the vertex without intravenous contrast. COMPARISON:  None. FINDINGS: Brain: There is no acute intracranial hemorrhage, mass effect, or edema. Gray-white differentiation  is preserved. Ventricles and sulci are normal in size and configuration. There is no extra-axial collection. Vascular: No hyperdense vessel. Skull: Unremarkable. Sinuses/Orbits: Polypoid mucosal thickening. Orbits are unremarkable. Other: Mastoid air cells are clear. ASPECTS (Alberta Stroke Program Early CT Score) - Ganglionic level infarction (caudate, lentiform nuclei, internal capsule, insula, M1-M3 cortex): 7 - Supraganglionic infarction (M4-M6 cortex): 3 Total score (0-10 with 10 being normal): 10 IMPRESSION: There is no acute intracranial hemorrhage or evidence of acute infarction. ASPECT score is 10. These results were communicated to Dr. Wilford Corner at 2:59 pm on 06/03/2020 by text page via the [facility] messaging system. Electronically Signed   By: Guadlupe Spanish M.D.   On: 06/03/2020 15:02    PHYSICAL EXAM  Temp:  [97.6 F (36.4 C)-98.3 F (36.8 C)] 97.8 F (36.6 C) (12/01 1600) Pulse Rate:  [58-84] 84 (12/01 1800) Resp:  [12-20] 18 (12/01 1800) BP: (96-115)/(48-75) 115/65 (12/01 1800) SpO2:  [94 %-100 %] 98 % (12/01 1800)  General - Well nourished, well developed, in no apparent distress.  Ophthalmologic - fundi not visualized due to noncooperation.  Cardiovascular - Regular rhythm and rate.  Mental Status -  Level of arousal and orientation to time, place, and person were intact. Language including expression, naming, repetition, comprehension was assessed and found intact. Fund of Knowledge was assessed and was intact.  Cranial Nerves II - XII - II - Visual field intact OU. III, IV, VI - Extraocular movements intact. V - Facial sensation intact bilaterally. VII - Facial movement intact bilaterally. VIII - Hearing & vestibular intact bilaterally. X - Palate elevates symmetrically. XI - Chin turning & shoulder shrug intact bilaterally. XII - Tongue protrusion intact.  Motor Strength - The patient's strength was normal in all extremities and pronator drift was absent.  Bulk was  normal and fasciculations were absent.   Motor Tone - Muscle tone was assessed at the neck and appendages and was normal.  Reflexes - The patient's reflexes were symmetrical in all extremities and she had no pathological reflexes.  Sensory - Light touch, temperature/pinprick were assessed and were symmetrical.    Coordination - The patient had normal movements in the hands and feet with no ataxia or dysmetria.  Tremor was absent.  Gait and Station - deferred.   ASSESSMENT/PLAN Ms. [name] is a 49 y.o. 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 Received tPA 06/03/2020 at 1458.  Stroke vs. 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 Chronic sinusitis. (done for new onset HA)  Code Stroke CT head No acute abnormality. ASPECTS 10.     MRI pending  MRA head and neck pending  TEE 03/2020 - small  IA shunt -later reviewed by Dr. Excell Seltzer, no significant IA shunt  Cardiac CT 11/19 - no CAD, no PFO or IA shunt  2D Echo EF 60-65%. No source of embolus. RA mildly dilated.   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 DAPT ->aspirin only 05/20/2020.  CT head and neck negative.  EF 50 to 55%.  TCD bubble study  negative for PFO and LE venous Doppler negative for DVT.  Hypercoagulable and autoimmune work-up negative.  Initial TEE concerning for possible IA shunt (previously closed ASD).  However, on review from Dr. Excell Seltzer, no significant IA shunt  Cardiac CT 11/19 no CAD or PFO or IA shunt  loop placed 05/20/2020  History of ASD  ASD closure 30 yrs ago at [facility],   small IA shunt seen during TEE 03/2020, but not significant by [facility]   Cardiac CT 05/2020 no IA shunt  Hyperlipidemia  Home meds:  lipitor 20  No intensive statin given LDL less than goal  LDL 64, goal < 70  Continue statin at discharge  Hashimoto's thyroiditis  03/2020, TSH 6.13 (H), but FT4 normal, Thyroid peroxidase Ab 55 (H), Thyroglobulin  Ab - 1.5 (H)  This admission TSH and free T4 pending  Per literature, Hashimoto's thyroiditis is associated with increased stroke risk, especially within the first year of diagnosis due to thyroid hormone deficiency  Other Stroke Risk Factors  Former Cigarette smoker  Mild ETOH use (1/wk), advised to drink no more than 1 drink(s) a day  Obesity, Body mass index is 33.02 kg/m., BMI >/= 30 associated with increased stroke risk, recommend weight loss, diet and exercise as appropriate   Hx menstrual migraines  Other Active Problems  New onset HA w/ normal MRI. Trial Nurtec prn  Hospital day # 1  This patient is critically ill due to stroke symptoms status post TPA, Hashimoto's thyroiditis and at significant risk of neurological worsening, death form recurrent stroke, hemorrhagic conversion, hemorrhage from TPA, thyroid storm. This patient's care requires constant monitoring of vital signs, hemodynamics, respiratory and cardiac monitoring, review of multiple databases, neurological assessment, discussion with family, other specialists and medical decision making of high complexity. I spent 35 minutes of neurocritical care time in the care of this patient. I had long  discussion with patient and husband at bedside, updated pt current condition, treatment plan and potential prognosis, and answered all the questions.  They expressed understanding and appreciation.   Marvel Plan, MD PhD Stroke Neurology 06/04/2020 6:20 PM    To contact Stroke Continuity provider, please refer to [email or web address]. After hours, contact General Neurology

## 2020-06-04 NOTE — Evaluation (Signed)
Occupational Therapy Evaluation Patient Details Name: [name] MRN: [id number] DOB: August 20, 1970 Today's Date: 06/04/2020    History of Present Illness Pt is a 49 yo L-hand dominant female presenting with LUE weakness/numbness. She had an MRI a few days ago due to word-finding difficulties, but this was normal. PMH includes stroke (R parietal occiptal and L frontal infarcts with resolution of symptoms). Pt scored 25/30 on the SLUMS in 03/2020 but was felt to be at her baseline at that time.     Clinical Impression   This 49 y/o female presents with the above. PTA pt independent with ADL, iADL and functional mobility. Pt currently presenting very close to her baseline function. Pt demonstrating room/hallway level mobility without AD, ADL tasks and balance challenges (reaching for item on floor) at mod independent level. Pt does endorse some continued "tightness" and with reduced fine motor/coordination in dominant LUE. Issued and educated in fine motor HEP with pt verbalizing/return demonstrating understanding. Further reviewed and educated re: signs/symptoms of stroke including BEFAST acronym with pt verbalizing understanding. Pt with no further acute OT needs identified at this time, do not anticipate she will require follow up OT services after discharge. Acute OT to sign off, thank you for this referral.     Follow Up Recommendations  No OT follow up;Supervision - Intermittent    Equipment Recommendations  None recommended by OT           Precautions / Restrictions Precautions Precautions: None Restrictions Weight Bearing Restrictions: No      Mobility Bed Mobility Overal bed mobility: Independent                  Transfers Overall transfer level: Independent                    Balance Overall balance assessment: Independent                                         ADL either performed or assessed with clinical judgement   ADL Overall  ADL's : At baseline                                       General ADL Comments: pt performing room/hallway mobility, standing grooming ADL, demonstrates ability to reach towards floor for LB ADL without difficulty; performing tasks at mod independent level today      Vision   Vision Assessment?: No apparent visual deficits     Perception     Praxis      Pertinent Vitals/Pain Pain Assessment: No/denies pain Faces Pain Scale: No hurt     Hand Dominance Left   Extremity/Trunk Assessment Upper Extremity Assessment Upper Extremity Assessment: LUE deficits/detail LUE Deficits / Details: pt reports improvements in LUE function since initial admit, reports continued "tightness" in digits/hands, some decreased fine motor/coordination LUE Coordination: decreased fine motor (minimally)   Lower Extremity Assessment Lower Extremity Assessment: Defer to PT evaluation;Overall [facility] for tasks assessed   Cervical / Trunk Assessment Cervical / Trunk Assessment: Normal   Communication Communication Communication: No difficulties   Cognition Arousal/Alertness: Awake/alert Behavior During Therapy: WFL for tasks assessed/performed Overall Cognitive Status: Within Functional Limits for tasks assessed  General Comments  VSS    Exercises Exercises: Other exercises Other Exercises Other Exercises: issued fine motor/coordination handout for dominant LUE; pt verbalizing/return demonstrating understanding. educated in continued use of LUE as much as possible    Shoulder Instructions      Home Living Family/patient expects to be discharged to:: Private residence Living Arrangements: Spouse/significant other Available Help at Discharge: Family;Available 24 hours/day Type of Home: House Home Access: Stairs to enter [employer] of Steps: 4 Entrance Stairs-Rails: None Home Layout: Two level;Able to live on main level  with bedroom/bathroom Alternate Level Stairs-Number of Steps: 1 Alternate Level Stairs-Rails: None Bathroom Shower/Tub: Producer, television/film/video: Standard Bathroom Accessibility: Yes   Home Equipment: Cane - single point;Crutches;Shower seat - built in      Lives With: Spouse    Prior Functioning/Environment Level of Independence: Independent        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 [place]        OT Problem List: Impaired UE functional use;Impaired sensation;Decreased coordination      OT Treatment/Interventions:      OT Goals(Current goals can be found in the care plan section) Acute Rehab OT Goals Patient Stated Goal: home soon OT Goal Formulation: All assessment and education complete, DC therapy  OT Frequency:     Barriers to D/C:            Co-evaluation PT/OT/SLP Co-Evaluation/Treatment:  (overlap with PT)            AM-PAC OT "6 Clicks" Daily Activity     Outcome Measure Help from another person eating meals?: None Help from another person taking care of personal grooming?: None Help from another person toileting, which includes using toliet, bedpan, or urinal?: None Help from another person bathing (including washing, rinsing, drying)?: None Help from another person to put on and taking off regular upper body clothing?: None Help from another person to put on and taking off regular lower body clothing?: None 6 Click Score: 24   End of Session Nurse Communication: Mobility status  Activity Tolerance: Patient tolerated treatment well Patient left: in chair;with call bell/phone within reach;with family/visitor present  OT Visit Diagnosis: Other symptoms and signs involving the nervous system (R29.898)                Time: 3976-7341 OT Time Calculation (min): 30 min Charges:  OT General Charges $OT Visit: 1 Visit OT Evaluation $OT Eval Moderate Complexity: 1 Mod  Marcy Siren, OT Acute Rehabilitation  Services Pager [phone] Office [phone]    Orlando Penner 06/04/2020, 11:02 AM

## 2020-06-05 ENCOUNTER — Encounter (HOSPITAL_COMMUNITY): Payer: Self-pay | Admitting: Student in an Organized Health Care Education/Training Program

## 2020-06-05 ENCOUNTER — Encounter: Payer: Self-pay | Admitting: *Deleted

## 2020-06-05 MED ORDER — SODIUM CHLORIDE 0.9 % IV SOLN: INTRAVENOUS | Status: DC

## 2020-06-05 NOTE — Progress Notes (Signed)
STROKE TEAM PROGRESS NOTE   INTERVAL HISTORY Patient sitting in bed, neuro intact.  Reviewed MRI with her and it showed right frontal punctate infarct.  Reviewed further stroke work-up options with her, she declined LP, but agree with cerebral angiogram.  Will arrange with Dr. Corliss Skains in the morning.  Vitals:   06/05/20 0800 06/05/20 0900 06/05/20 1000 06/05/20 1100  BP: (!) 105/59 109/67 (!) 106/55 113/67  Pulse: 64 75 72 81  Resp: 16 (!) 21 15 (!) 23  Temp: 97.7 F (36.5 C)     TempSrc: Oral     SpO2: 96% 98% 97% 95%  Weight:       CBC:  Recent Labs  Lab 06/03/20 1446 06/03/20 1458  WBC 7.2  --   NEUTROABS 4.2  --   HGB 13.6 13.6  HCT 40.1 40.0  MCV 94.6  --   PLT 277  --    Basic Metabolic Panel:  Recent Labs  Lab 06/03/20 1446 06/03/20 1458  NA 139 141  K 3.5 3.5  CL 104 103  CO2 23  --   GLUCOSE 113* 108*  BUN 7 6  CREATININE 0.81 0.70  CALCIUM 9.1  --    Lipid Panel:  Recent Labs  Lab 06/04/20 0355  CHOL 136  TRIG 109  HDL 50  CHOLHDL 2.7  VLDL 22  LDLCALC 64   HgbA1c:  Recent Labs  Lab 06/04/20 0355  HGBA1C 5.3   Urine Drug Screen: No results for input(s): LABOPIA, COCAINSCRNUR, LABBENZ, AMPHETMU, THCU, LABBARB in the last 168 hours.  Alcohol Level No results for input(s): ETH in the last 168 hours.  IMAGING past 24 hours MR ANGIO HEAD WO CONTRAST  Addendum Date: 06/04/2020   ADDENDUM REPORT: 06/04/2020 14:58 ADDENDUM: Correction to the technique: MRA of the neck was without and with contrast. Electronically Signed   By: Feliberto Harts MD   On: 06/04/2020 14:58   Result Date: 06/04/2020 CLINICAL DATA:  Stroke follow-up. Migraine. Difficulty moving left arm/hand. EXAM: MRI HEAD WITHOUT CONTRAST MRA HEAD WITHOUT CONTRAST MRA NECK WITHOUT CONTRAST TECHNIQUE: Multiplanar, multiecho pulse sequences of the brain and surrounding structures were obtained without intravenous contrast. Angiographic images of the Circle of Willis were obtained using  MRA technique without intravenous contrast. Angiographic images of the neck were obtained using MRA technique without intravenous contrast. Carotid stenosis measurements (when applicable) are obtained utilizing NASCET criteria, using the distal internal carotid diameter as the denominator. COMPARISON:  CTA March 11, 2020.  MRI May 27, 2020. FINDINGS: MRI HEAD FINDINGS Brain: Small acute infarct in the precentral gyrus of the right frontal lobe (series 5, image 87). Mild associated edema without mass effect. 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 No hydrocephalus. No acute hemorrhage. No mass lesion. No midline shift. Skull and upper cervical spine: No focal marrow replacing lesion. Sinuses/Orbits: Small retention cysts in the left maxillary sinus. MRA HEAD FINDINGS No evidence of visible hemodynamically significant proximal stenosis or large vessel occlusion in the anterior or posterior circulation. 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 No aneurysm identified. MRA NECK FINDINGS No evidence of hemodynamically significant stenosis or occlusion. IMPRESSION: 1. Small acute infarct in the precentral gyrus of the right frontal lobe. Mild associated edema without mass effect. 2. No large vessel occlusion or hemodynamically significant proximal stenosis in the head or neck. Electronically Signed: By: Feliberto Harts MD On: 06/04/2020 14:55   MR ANGIO NECK W WO CONTRAST  Addendum  Date: 06/04/2020   ADDENDUM REPORT: 06/04/2020 14:58 ADDENDUM: Correction to the technique: MRA of the neck was without and with contrast. Electronically Signed   By: Feliberto Harts MD   On: 06/04/2020 14:58   Result Date: 06/04/2020 CLINICAL DATA:  Stroke follow-up. Migraine. Difficulty moving left arm/hand. EXAM: MRI HEAD WITHOUT CONTRAST MRA HEAD WITHOUT CONTRAST MRA NECK WITHOUT CONTRAST TECHNIQUE: Multiplanar, multiecho pulse sequences of the brain and surrounding  structures were obtained without intravenous contrast. Angiographic images of the Circle of Willis were obtained using MRA technique without intravenous contrast. Angiographic images of the neck were obtained using MRA technique without intravenous contrast. Carotid stenosis measurements (when applicable) are obtained utilizing NASCET criteria, using the distal internal carotid diameter as the denominator. COMPARISON:  CTA March 11, 2020.  MRI May 27, 2020. FINDINGS: MRI HEAD FINDINGS Brain: Small acute infarct in the precentral gyrus of the right frontal lobe (series 5, image 87). Mild associated edema without mass effect. 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 No hydrocephalus. No acute hemorrhage. No mass lesion. No midline shift. Skull and upper cervical spine: No focal marrow replacing lesion. Sinuses/Orbits: Small retention cysts in the left maxillary sinus. MRA HEAD FINDINGS No evidence of visible hemodynamically significant proximal stenosis or large vessel occlusion in the anterior or posterior circulation. 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 No aneurysm identified. MRA NECK FINDINGS No evidence of hemodynamically significant stenosis or occlusion. IMPRESSION: 1. Small acute infarct in the precentral gyrus of the right frontal lobe. Mild associated edema without mass effect. 2. No large vessel occlusion or hemodynamically significant proximal stenosis in the head or neck. Electronically Signed: By: Feliberto Harts MD On: 06/04/2020 14:55   MR BRAIN WO CONTRAST  Addendum Date: 06/04/2020   ADDENDUM REPORT: 06/04/2020 14:58 ADDENDUM: Correction to the technique: MRA of the neck was without and with contrast. Electronically Signed   By: Feliberto Harts MD   On: 06/04/2020 14:58   Result Date: 06/04/2020 CLINICAL DATA:  Stroke follow-up. Migraine. Difficulty moving left arm/hand. EXAM: MRI HEAD WITHOUT CONTRAST MRA HEAD WITHOUT  CONTRAST MRA NECK WITHOUT CONTRAST TECHNIQUE: Multiplanar, multiecho pulse sequences of the brain and surrounding structures were obtained without intravenous contrast. Angiographic images of the Circle of Willis were obtained using MRA technique without intravenous contrast. Angiographic images of the neck were obtained using MRA technique without intravenous contrast. Carotid stenosis measurements (when applicable) are obtained utilizing NASCET criteria, using the distal internal carotid diameter as the denominator. COMPARISON:  CTA March 11, 2020.  MRI May 27, 2020. FINDINGS: MRI HEAD FINDINGS Brain: Small acute infarct in the precentral gyrus of the right frontal lobe (series 5, image 87). Mild associated edema without mass effect. 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 No hydrocephalus. No acute hemorrhage. No mass lesion. No midline shift. Skull and upper cervical spine: No focal marrow replacing lesion. Sinuses/Orbits: Small retention cysts in the left maxillary sinus. MRA HEAD FINDINGS No evidence of visible hemodynamically significant proximal stenosis or large vessel occlusion in the anterior or posterior circulation. 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 No aneurysm identified. MRA NECK FINDINGS No evidence of hemodynamically significant stenosis or occlusion. IMPRESSION: 1. Small acute infarct in the precentral gyrus of the right frontal lobe. Mild associated edema without mass effect. 2. No large vessel occlusion or hemodynamically significant proximal stenosis in the head or neck. Electronically Signed: By: Feliberto Harts MD On:  06/04/2020 14:55    PHYSICAL EXAM  Temp:  [97.7 F (36.5 C)-98.6 F (37 C)] 97.7 F (36.5 C) (12/02 0800) Pulse Rate:  [58-93] 81 (12/02 1100) Resp:  [13-26] 23 (12/02 1100) BP: (88-116)/(39-71) 113/67 (12/02 1100) SpO2:  [94 %-100 %] 95 % (12/02 1100)  General - Well nourished, well developed,  in no apparent distress.  Ophthalmologic - fundi not visualized due to noncooperation.  Cardiovascular - Regular rhythm and rate.  Mental Status -  Level of arousal and orientation to time, place, and person were intact. Language including expression, naming, repetition, comprehension was assessed and found intact. Fund of Knowledge was assessed and was intact.  Cranial Nerves II - XII - II - Visual field intact OU. III, IV, VI - Extraocular movements intact. V - Facial sensation intact bilaterally. VII - Facial movement intact bilaterally. VIII - Hearing & vestibular intact bilaterally. X - Palate elevates symmetrically. XI - Chin turning & shoulder shrug intact bilaterally. XII - Tongue protrusion intact.  Motor Strength - The patient's strength was normal in all extremities and pronator drift was absent.  Bulk was normal and fasciculations were absent.   Motor Tone - Muscle tone was assessed at the neck and appendages and was normal.  Reflexes - The patient's reflexes were symmetrical in all extremities and she had no pathological reflexes.  Sensory - Light touch, temperature/pinprick were assessed and were symmetrical.    Coordination - The patient had normal movements in the hands and feet with no ataxia or dysmetria.  Tremor was absent.  Gait and Station - deferred.   ASSESSMENT/PLAN [name] is a 49 y.o. 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 Received tPA 06/03/2020 at 1458.  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 Chronic sinusitis. (done for new onset HA)  Code Stroke CT head No acute abnormality.  ASPECTS 10.     MRI small R frontal precentral gyrus infarct   MRA head and neck no LVO  TEE 03/2020 - small  IA shunt -later reviewed by Dr. Excell Seltzer, no significant IA shunt  Cardiac CT 11/19 - no CAD, no PFO or IA shunt  2D Echo EF 60-65%. No source of embolus. RA mildly dilated.   Loop interrogation NO AF or other arrhythmia    LP was declined but OK with cerebral angiogram to rule out vasculitis  LDL 64  HgbA1c 5.3  VTE prophylaxis - SCDs   aspirin 81 mg daily prior to admission (DAPT up til 05/20/2020), now on aspirin 81 mg daily and clopidogrel 75 mg daily. 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 DAPT ->aspirin only 05/20/2020.  CT head and neck negative.  EF 50 to 55%.  TCD bubble study negative for PFO and LE venous Doppler negative for DVT.  Hypercoagulable and autoimmune work-up negative.  Initial TEE concerning for possible IA shunt (previously closed ASD).  However,  on review from Dr. Excell Seltzerooper, no significant IA shunt  Cardiac CT 11/19 no CAD or PFO or IA shunt  loop placed 05/20/2020  History of ASD  [facility],   small IA sh[facility] during TEE [facility]2020, [facility]   Cardiac CT 05/2020 no IA shunt  Hyperlipidemia  Home meds:  lipitor 20  No intensive statin given LDL less than goal  LDL 64, goal < 70  Continue statin at discharge  Hashimoto's thyroiditis  Diagnosed 17 years ago  03/2020, TSH 6.13 (H), but FT4 normal, Thyroid peroxidase Ab 55 (H), Thyroglobulin Ab - 1.5 (H)  This admission TSH normal and free T4 normal  Has not on synthroid yet, waiting for endocrinologist appointment in 07/2020  Per literature, Hashimoto's thyroiditis is associated with slightly increased stroke risk, especially  within the first year of diagnosis due to thyroid hormone deficiency  Other Stroke Risk Factors  Former Cigarette smoker  Mild ETOH use (1/wk), advised to drink no more than 1 drink(s) a day  Obesity, Body mass index is 33.02 kg/m., BMI >/= 30 associated with increased stroke risk, recommend weight loss, diet and exercise as appropriate   Hx menstrual migraines  Other Active Problems  New onset HA w/ normal MRI. Trial Nurtec prn  Hospital day # 2   Marvel PlanJindong Aniqa Hare, MD PhD Stroke Neurology 06/05/2020 12:02 PM    To contact Stroke Continuity provider, please refer to [email or web address]. After hours, contact General Neurology

## 2020-06-05 NOTE — Progress Notes (Signed)
Chief Complaint: Patient was seen in consultation today for  Chief Complaint  Patient presents with  . Code Stroke   at the request of Dr. Roda Shutters  Referring Physician(s): Dr. Marvel Plan  Supervising Physician: Julieanne Cotton  Patient Status: [name] - In-pt  History of Present Illness: [name] is a 49 y.o. female with complex medical hx presented with LUE weakness/numbess. 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  Neurology requests further imaging workup by means of formal cerebral arteriogram. PMHx, meds, labs, imaging, allergies reviewed. Feels well, no recent fevers, chills, illness. Family at bedside.   Past Medical History:  Diagnosis Date  . Hx of migraines     Past Surgical History:  Procedure Laterality Date  . BUBBLE STUDY  03/13/2020   Procedure: BUBBLE STUDY;  Surgeon: Quintella Reichert, MD;  Location: [facility] ENDOSCOPY;  Service: Cardiovascular;;  . ENDOMETRIAL ABLATION     Uterine/heavy menses  . SHOULDER SURGERY     Left,/bone spurs  . TEE WITHOUT CARDIOVERSION N/A 03/13/2020   Procedure: TRANSESOPHAGEAL ECHOCARDIOGRAM (TEE);  Surgeon: Quintella Reichert, MD;  Location: [facility] ENDOSCOPY;  Service: Cardiovascular;  Laterality: N/A;    Allergies: Bee venom  Medications:  Current Facility-Administered Medications:  .   stroke: mapping our early stages of recovery book, , Does not apply, Once, [name], MD .  acetaminophen (TYLENOL) tablet 650 mg, 650 mg, Oral, Q4H PRN **OR** acetaminophen (TYLENOL) 160 MG/5ML solution 650 mg, 650 mg, Per Tube, Q4H PRN **OR** acetaminophen (TYLENOL) suppository 650 mg, 650 mg, Rectal, Q4H PRN, [name], MD .  aspirin EC tablet 81 mg, 81 mg, Oral, Daily, Marvel Plan, MD, 81 mg at 06/05/20 0933 .  atorvastatin (LIPITOR) tablet 20 mg, 20 mg, Oral, Daily, Biby, Sharon L, NP, 20 mg at 06/05/20 0934 .  Chlorhexidine Gluconate Cloth 2 % PADS 6 each, 6 each, Topical, Daily,  [name], MD, 6 each at 06/04/20 1442 .  clopidogrel (PLAVIX) tablet 75 mg, 75 mg, Oral, Daily, Marvel Plan, MD, 75 mg at 06/05/20 0933 .  senna-docusate (Senokot-S) tablet 1 tablet, 1 tablet, Oral, QHS PRN, [name], MD    History reviewed. No pertinent family history.  Social History   Socioeconomic History  . Marital status: Married    Spouse name: Not on file  . Number of children: Not on file  . Years of education: Not on file  . Highest education level: Not on file  Occupational History  . Not on file  Tobacco Use  . Smoking status: Former Games developer  . Smokeless tobacco: Never Used  Substance and Sexual Activity  . Alcohol use: Yes    Alcohol/week: 1.0 standard drink    Types: 1 Cans of beer per week    Comment: states 1 beer every other day or so   . Drug use: Not on file  . Sexual activity: Not on file  Other Topics Concern  . Not on file  Social History Narrative   Last Mamm: 04.07.10 @ [facility] BI-RADS Category 1: Negative^MM DIGITAL DG BILATERAL   Social Determinants of Health   Financial Resource Strain:   . Difficulty of Paying Living Expenses: Not on file  Food Insecurity:   . Worried About Programme researcher, broadcasting/film/video in the Last Year: Not on file  . Ran Out of Food in the Last Year: Not on file  Transportation Needs: No Transportation Needs  . Lack of Transportation (Medical):  No  . Lack of Transportation (Non-Medical): No  Physical Activity:   . Days of Exercise per Week: Not on file  . Minutes of Exercise per Session: Not on file  Stress:   . Feeling of Stress : Not on file  Social Connections:   . Frequency of Communication with Friends and Family: Not on file  . Frequency of Social Gatherings with Friends and Family: Not on file  . Attends Religious Services: Not on file  . Active Member of Clubs or Organizations: Not on file  . Attends Banker Meetings: Not on file  . Marital Status: Not on file    Review of Systems: A 12 point ROS  discussed and pertinent positives are indicated in the HPI above.  All other systems are negative.  Review of Systems  Vital Signs: BP (!) 116/54   Pulse 65   Temp 98.4 F (36.9 C) (Oral)   Resp 15   Wt 90 kg   SpO2 97%   BMI 33.02 kg/m   Physical Exam Constitutional:      Appearance: Normal appearance. She is not ill-appearing.  HENT:     Mouth/Throat:     Mouth: Mucous membranes are moist.     Pharynx: Oropharynx is clear.  Cardiovascular:     Rate and Rhythm: Normal rate and regular rhythm.     Pulses: Normal pulses.     Heart sounds: Normal heart sounds.  Pulmonary:     Effort: Pulmonary effort is normal. No respiratory distress.     Breath sounds: Normal breath sounds.  Abdominal:     General: Abdomen is flat. There is no distension.     Palpations: Abdomen is soft.  Skin:    General: Skin is warm and dry.  Neurological:     Mental Status: She is alert and oriented to person, place, and time.  Psychiatric:        Mood and Affect: Mood normal.        Thought Content: Thought content normal.        Judgment: Judgment normal.     Imaging: DG Chest 1 View  Result Date: 06/03/2020 CLINICAL DATA:  Left upper extremity weakness and tingling EXAM: CHEST  1 VIEW COMPARISON:  05/23/2020 FINDINGS: Single frontal view of the chest demonstrates a stable cardiac silhouette. Loop recorder is seen within the left anterior chest wall. No airspace disease, effusion, or pneumothorax. No acute bony abnormalities. IMPRESSION: 1. No acute intrathoracic process. Electronically Signed   By: Sharlet Salina M.D.   On: 06/03/2020 15:57   MR ANGIO HEAD WO CONTRAST  Addendum Date: 06/04/2020   ADDENDUM REPORT: 06/04/2020 14:58 ADDENDUM: Correction to the technique: MRA of the neck was without and with contrast. Electronically Signed   By: Feliberto Harts MD   On: 06/04/2020 14:58   Result Date: 06/04/2020 CLINICAL DATA:  Stroke follow-up. Migraine. Difficulty moving left arm/hand.  EXAM: MRI HEAD WITHOUT CONTRAST MRA HEAD WITHOUT CONTRAST MRA NECK WITHOUT CONTRAST TECHNIQUE: Multiplanar, multiecho pulse sequences of the brain and surrounding structures were obtained without intravenous contrast. Angiographic images of the Circle of Willis were obtained using MRA technique without intravenous contrast. Angiographic images of the neck were obtained using MRA technique without intravenous contrast. Carotid stenosis measurements (when applicable) are obtained utilizing NASCET criteria, using the distal internal carotid diameter as the denominator. COMPARISON:  CTA March 11, 2020.  MRI May 27, 2020. FINDINGS: MRI HEAD FINDINGS Brain: Small acute infarct in the precentral gyrus of  the right frontal lobe (series 5, image 87). Mild associated edema without mass effect. 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 No hydrocephalus. No acute hemorrhage. No mass lesion. No midline shift. Skull and upper cervical spine: No focal marrow replacing lesion. Sinuses/Orbits: Small retention cysts in the left maxillary sinus. MRA HEAD FINDINGS No evidence of visible hemodynamically significant proximal stenosis or large vessel occlusion in the anterior or posterior circulation. 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 No aneurysm identified. MRA NECK FINDINGS No evidence of hemodynamically significant stenosis or occlusion. IMPRESSION: 1. Small acute infarct in the precentral gyrus of the right frontal lobe. Mild associated edema without mass effect. 2. No large vessel occlusion or hemodynamically significant proximal stenosis in the head or neck. Electronically Signed: By: Feliberto Harts MD On: 06/04/2020 14:55   MR ANGIO NECK W WO CONTRAST  Addendum Date: 06/04/2020   ADDENDUM REPORT: 06/04/2020 14:58 ADDENDUM: Correction to the technique: MRA of the neck was without and with contrast. Electronically Signed   By: Feliberto Harts MD   On:  06/04/2020 14:58   Result Date: 06/04/2020 CLINICAL DATA:  Stroke follow-up. Migraine. Difficulty moving left arm/hand. EXAM: MRI HEAD WITHOUT CONTRAST MRA HEAD WITHOUT CONTRAST MRA NECK WITHOUT CONTRAST TECHNIQUE: Multiplanar, multiecho pulse sequences of the brain and surrounding structures were obtained without intravenous contrast. Angiographic images of the Circle of Willis were obtained using MRA technique without intravenous contrast. Angiographic images of the neck were obtained using MRA technique without intravenous contrast. Carotid stenosis measurements (when applicable) are obtained utilizing NASCET criteria, using the distal internal carotid diameter as the denominator. COMPARISON:  CTA March 11, 2020.  MRI May 27, 2020. FINDINGS: MRI HEAD FINDINGS Brain: Small acute infarct in the precentral gyrus of the right frontal lobe (series 5, image 87). Mild associated edema without mass effect. 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 No hydrocephalus. No acute hemorrhage. No mass lesion. No midline shift. Skull and upper cervical spine: No focal marrow replacing lesion. Sinuses/Orbits: Small retention cysts in the left maxillary sinus. MRA HEAD FINDINGS No evidence of visible hemodynamically significant proximal stenosis or large vessel occlusion in the anterior or posterior circulation. 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 No aneurysm identified. MRA NECK FINDINGS No evidence of hemodynamically significant stenosis or occlusion. IMPRESSION: 1. Small acute infarct in the precentral gyrus of the right frontal lobe. Mild associated edema without mass effect. 2. No large vessel occlusion or hemodynamically significant proximal stenosis in the head or neck. Electronically Signed: By: Feliberto Harts MD On: 06/04/2020 14:55   MR BRAIN WO CONTRAST  Addendum Date: 06/04/2020   ADDENDUM REPORT: 06/04/2020 14:58 ADDENDUM: Correction to the  technique: MRA of the neck was without and with contrast. Electronically Signed   By: Feliberto Harts MD   On: 06/04/2020 14:58   Result Date: 06/04/2020 CLINICAL DATA:  Stroke follow-up. Migraine. Difficulty moving left arm/hand. EXAM: MRI HEAD WITHOUT CONTRAST MRA HEAD WITHOUT CONTRAST MRA NECK WITHOUT CONTRAST TECHNIQUE: Multiplanar, multiecho pulse sequences of the brain and surrounding structures were obtained without intravenous contrast. Angiographic images of the Circle of Willis were obtained using MRA technique without intravenous contrast. Angiographic images of the neck were obtained using MRA technique without intravenous contrast. Carotid stenosis measurements (when applicable) are obtained utilizing NASCET criteria, using the distal internal carotid diameter as the denominator. COMPARISON:  CTA March 11, 2020.  MRI May 27, 2020. FINDINGS:  MRI HEAD FINDINGS Brain: Small acute infarct in the precentral gyrus of the right frontal lobe (series 5, image 87). Mild associated edema without mass effect. 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 No hydrocephalus. No acute hemorrhage. No mass lesion. No midline shift. Skull and upper cervical spine: No focal marrow replacing lesion. Sinuses/Orbits: Small retention cysts in the left maxillary sinus. MRA HEAD FINDINGS No evidence of visible hemodynamically significant proximal stenosis or large vessel occlusion in the anterior or posterior circulation. 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 No aneurysm identified. MRA NECK FINDINGS No evidence of hemodynamically significant stenosis or occlusion. IMPRESSION: 1. Small acute infarct in the precentral gyrus of the right frontal lobe. Mild associated edema without mass effect. 2. No large vessel occlusion or hemodynamically significant proximal stenosis in the head or neck. Electronically Signed: By: Feliberto Harts MD On: 06/04/2020 14:55   MR  BRAIN W WO CONTRAST  Result Date: 05/31/2020 [facility] NEUROIMAGING REPORT STUDY DATE: 05/27/20 PATIENT NAME: VESSIE OLMSTED DOB: 03-22-1971 MRN: [id number] ORDERING CLINICIAN: Ihor Austin, NP CLINICAL HISTORY: 49 year old female with headaches. EXAM: MR BRAIN W WO CONTRAST TECHNIQUE: MRI of the brain without contrast was obtained utilizing 5 mm axial slices with T1, T2, T2 flair, T2 star gradient echo and diffusion weighted views.  T1 sagittal and T2 coronal views were obtained. CONTRAST: 15ml multihance COMPARISON: 03/12/20 MRI IMAGING SITE: [facility] [address] (1.5 Tesla MRI) FINDINGS: Mild left parietal chronic ischemic infarction (stable). No abnormal lesions are seen on diffusion-weighted views to suggest acute ischemia. The cortical sulci, fissures and cisterns are normal in size and appearance. Lateral, third and fourth ventricle are normal in size and appearance. No extra-axial fluid collections are seen. No evidence of mass effect or midline shift.  No abnormal lesions on post-contrast views. On sagittal views the posterior fossa, pituitary gland and corpus callosum are unremarkable. No evidence of intracranial hemorrhage on gradient echo views. The orbits and their contents, paranasal sinuses and calvarium are notable for mucus thickening in the ethmoid and sphenoid sinuses. Mucus retention cyst in the left maxillary sinus. Intracranial flow voids are present.   MRI brain (with and without) demonstrating: - Mild left parietal chronic ischemic infarction (stable). - Chronic sinusitis changes. - No acute findings. 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 EXAM: Cardiac/Coronary CTA TECHNIQUE: The patient was scanned on a [employer]. FINDINGS: A 100 kV prospective scan was triggered in the descending thoracic aorta at 111 HU's. Axial non-contrast 3 mm slices were carried out through the heart. The data set was analyzed on a dedicated work station and scored using the Agatson method. Gantry rotation speed was 250 msecs and collimation was .6 mm. 100 mg of PO Metoprolol and 0.8 mg of sl NTG was given. The 3D data set was reconstructed in 5% intervals of the 67-82 % of the R-R cycle. Diastolic phases were analyzed on a dedicated work station using MPR, MIP and VRT modes. The  patient received 80 cc of contrast. Aorta:  Normal size.  No calcifications.  No dissection. Aortic Valve:  Trileaflet.  No calcifications. Coronary Arteries:  Normal coronary origin.  Right dominance. RCA is a very large dominant artery that gives rise to PDA and PLA. There is no plaque. Left main is a large artery that gives rise to LAD and LCX arteries. Left main has no plaque. LAD is a medium caliber vessel that gives rise to one small diagonal artery and has no plaque. LCX is a small caliber non-dominant artery that has no plaque. Other findings: Normal pulmonary vein drainage into the left atrium (3 on the right and two on the left). Normal left atrial appendage without a thrombus. Normal size of the pulmonary artery. IMPRESSION: 1. Coronary calcium score of 0. This was 0 percentile for age and sex matched control. 2. Normal coronary origin with right dominance. 3. CAD-RADS 0. No evidence of CAD (0%). Consider non-atherosclerotic causes of chest pain. 4. No evidence for PFO or residual ASD. 5. Normal pulmonary vein drainage into the left atrium (3 on the right and 2  on the left). 6. No thrombus is seen in the left atrial appendage. Electronically Signed   By: Tobias Alexander   On: 05/26/2020 22:20   Result Date: 05/26/2020 EXAM: OVER-READ INTERPRETATION  CT CHEST The following report is an over-read performed by radiologist Dr. Trudie Reed of [facility] on 05/23/2020. This over-read does not include interpretation of cardiac or coronary anatomy or pathology. The coronary calcium score/coronary CTA interpretation by the cardiologist is attached. COMPARISON:  None. FINDINGS: 4 mm right middle lobe pulmonary nodule (axial image 20 of series 12). Within the visualized portions of the thorax there are no other larger more suspicious appearing pulmonary nodules or masses, there is no acute consolidative airspace disease, no pleural effusions, no pneumothorax and no lymphadenopathy. Visualized portions of the upper abdomen are unremarkable. There are no aggressive appearing lytic or blastic lesions noted in the visualized portions of the skeleton. Multiple small metallic densities are noted in the anterior mediastinum. Electronic device implanted in the medial aspect of the left breast, presumably an implantable loop recorder. IMPRESSION: 1. 4 mm right middle lobe pulmonary nodule, nonspecific, but statistically likely benign. No follow-up needed if patient is low-risk. Non-contrast chest CT can be considered in 12 months if patient is high-risk. This recommendation follows the consensus statement: Guidelines for Management of Incidental Pulmonary Nodules Detected on CT Images: From the Fleischner Society 2017; Radiology 2017; 284:228-243. Electronically Signed: By: Trudie Reed M.D. On: 05/23/2020 12:05   ECHOCARDIOGRAM COMPLETE  Result Date: 06/03/2020    ECHOCARDIOGRAM REPORT   Patient Name:   Madalin L Normoyle Date of Exam: 06/03/2020 Medical Rec #:  [id number]      Height:       65.0 in Accession #:    [id number]     Weight:       198.4 lb Date of  Birth:  01-Jan-1971      BSA:          1.971 m Patient Age:    49 years       BP:           103/72 mmHg Patient Gender: F              HR:           57 bpm. Exam Location:  Inpatient Procedure: 2D Echo, Cardiac Doppler, Color Doppler and Intracardiac  Opacification Agent Indications:    Stroke  History:        Patient has prior history of Echocardiogram examinations, most                 recent 03/13/2020. Stroke. PFO closure 30 years ago.  Sonographer:    Sheralyn Boatman RDCS Referring Phys: [id number] ASHISH ARORA  Sonographer Comments: Technically difficult study due to poor echo windows, suboptimal parasternal window and suboptimal apical window. Loop recorder in parasternal window. IMPRESSIONS  1. Left ventricular ejection fraction, by estimation, is 60 to 65%. The left ventricle has normal function. The left ventricle has no regional wall motion abnormalities. Left ventricular diastolic parameters were normal.  2. Right ventricular systolic function is low normal. The right ventricular size is mildly enlarged. There is normal pulmonary artery systolic pressure.  3. Right atrial size was mildly dilated.  4. The mitral valve is grossly normal. Trivial mitral valve regurgitation.  5. The aortic valve is grossly normal. Aortic valve regurgitation is not visualized. No aortic stenosis is present.  6. The inferior vena cava is normal in size with greater than 50% respiratory variability, suggesting right atrial pressure of 3 mmHg. Comparison(s): No significant change from prior study. Conclusion(s)/Recommendation(s): Otherwise normal echocardiogram, with minor abnormalities described in the report. No intracardiac source of embolism detected on this transthoracic study. A transesophageal echocardiogram is recommended to exclude cardiac source of embolism if clinically indicated. FINDINGS  Left Ventricle: Left ventricular ejection fraction, by estimation, is 60 to 65%. The left ventricle has normal function. The left  ventricle has no regional wall motion abnormalities. Definity contrast agent was given IV to delineate the left ventricular  endocardial borders. The left ventricular internal cavity size was normal in size. There is no left ventricular hypertrophy. Left ventricular diastolic parameters were normal. Right Ventricle: The right ventricular size is mildly enlarged. Right vetricular wall thickness was not well visualized. Right ventricular systolic function is low normal. There is normal pulmonary artery systolic pressure. The tricuspid regurgitant velocity is 2.17 m/s, and with an assumed right atrial pressure of 3 mmHg, the estimated right ventricular systolic pressure is 21.8 mmHg. Left Atrium: Left atrial size was normal in size. Right Atrium: Right atrial size was mildly dilated. Pericardium: There is no evidence of pericardial effusion. Mitral Valve: The mitral valve is grossly normal. Trivial mitral valve regurgitation. Tricuspid Valve: The tricuspid valve is normal in structure. Tricuspid valve regurgitation is mild . No evidence of tricuspid stenosis. Aortic Valve: The aortic valve is grossly normal. Aortic valve regurgitation is not visualized. No aortic stenosis is present. Pulmonic Valve: The pulmonic valve was not well visualized. Pulmonic valve regurgitation is not visualized. Aorta: The aortic root, ascending aorta, aortic arch and descending aorta are all structurally normal, with no evidence of dilitation or obstruction. Venous: The inferior vena cava is normal in size with greater than 50% respiratory variability, suggesting right atrial pressure of 3 mmHg. IAS/Shunts: The interatrial septum was not well visualized.  LEFT VENTRICLE PLAX 2D LVIDd:         4.10 cm     Diastology LVIDs:         2.80 cm     LV e' medial:    9.14 cm/s LV PW:         0.90 cm     LV E/e' medial:  7.7 LV IVS:        0.90 cm     LV e' lateral:   9.57 cm/s LVOT diam:  1.80 cm     LV E/e' lateral: 7.4 LV SV:         57 LV SV  Index:   29 LVOT Area:     2.54 cm  LV Volumes (MOD) LV vol d, MOD A2C: 75.2 ml LV vol d, MOD A4C: 86.9 ml LV vol s, MOD A2C: 27.6 ml LV vol s, MOD A4C: 29.8 ml LV SV MOD A2C:     47.5 ml LV SV MOD A4C:     86.9 ml LV SV MOD BP:      52.1 ml RIGHT VENTRICLE            IVC RV S prime:     7.83 cm/s  IVC diam: 1.50 cm TAPSE (M-mode): 1.6 cm LEFT ATRIUM             Index      RIGHT ATRIUM           Index LA diam:        3.40 cm 1.72 cm/m RA Area:     15.50 cm LA Vol (A2C):   19.1 ml 9.69 ml/m RA Volume:   42.10 ml  21.35 ml/m LA Vol (A4C):   17.0 ml 8.62 ml/m LA Biplane Vol: 18.8 ml 9.54 ml/m  AORTIC VALVE LVOT Vmax:   106.00 cm/s LVOT Vmean:  62.400 cm/s LVOT VTI:    0.224 m  AORTA Ao Root diam: 2.40 cm MITRAL VALVE               TRICUSPID VALVE MV Area (PHT): 2.99 cm    TR Peak grad:   18.8 mmHg MV Decel Time: 254 msec    TR Vmax:        217.00 cm/s MV E velocity: 70.70 cm/s MV A velocity: 61.70 cm/s  SHUNTS MV E/A ratio:  1.15        Systemic VTI:  0.22 m                            Systemic Diam: 1.80 cm Jodelle Red MD Electronically signed by Jodelle Red MD Signature Date/Time: 06/03/2020/7:59:28 PM    Final    CT HEAD CODE STROKE WO CONTRAST  Result Date: 06/03/2020 CLINICAL DATA:  Code stroke.  Left-sided weakness EXAM: CT HEAD WITHOUT CONTRAST TECHNIQUE: Contiguous axial images were obtained from the base of the skull through the vertex without intravenous contrast. COMPARISON:  None. FINDINGS: Brain: There is no acute intracranial hemorrhage, mass effect, or edema. Gray-white differentiation is preserved. Ventricles and sulci are normal in size and configuration. There is no extra-axial collection. Vascular: No hyperdense vessel. Skull: Unremarkable. Sinuses/Orbits: Polypoid mucosal thickening. Orbits are unremarkable. Other: Mastoid air cells are clear. ASPECTS (Alberta Stroke Program Early CT Score) - Ganglionic level infarction (caudate, lentiform nuclei, internal capsule,  insula, M1-M3 cortex): 7 - Supraganglionic infarction (M4-M6 cortex): 3 Total score (0-10 with 10 being normal): 10 IMPRESSION: There is no acute intracranial hemorrhage or evidence of acute infarction. ASPECT score is 10. These results were communicated to Dr. Wilford Corner at 2:59 pm on 06/03/2020 by text page via the [facility] messaging system. Electronically Signed   By: Guadlupe Spanish M.D.   On: 06/03/2020 15:02    Labs:  CBC: Recent Labs    03/11/20 1434 03/11/20 1443 03/13/20 0405 03/14/20 0204 06/03/20 1446 06/03/20 1458  WBC 6.9  --  8.1 10.2 7.2  --   HGB 13.1   < > 12.1  12.8 13.6 13.6  HCT 38.3   < > 35.3* 37.3 40.1 40.0  PLT 226  --  207 219 277  --    < > = values in this interval not displayed.    COAGS: Recent Labs    03/11/20 1434 06/03/20 1446  INR 0.9 0.9  APTT 23* 22*    BMP: Recent Labs    03/12/20 1811 03/12/20 1811 03/13/20 0405 03/13/20 0405 03/14/20 0204 05/12/20 1652 06/03/20 1446 06/03/20 1458  NA 140   < > 137   < > 138 138 139 141  K 3.9   < > 3.9   < > 3.6 3.9 3.5 3.5  CL 106   < > 104   < > 103 102 104 103  CO2 24   < > 23  --  26 24 23   --   GLUCOSE 102*   < > 105*   < > 107* 123* 113* 108*  BUN 7   < > 8   < > 10 10 7 6   CALCIUM 8.6*   < > 8.7*  --  8.9 9.2 9.1  --   CREATININE 0.64   < > 0.64   < > 0.71 0.77 0.81 0.70  GFRNONAA >60   < > >60  --  >60 92 >60  --   GFRAA >60  --  >60  --  >60 106  --   --    < > = values in this interval not displayed.    LIVER FUNCTION TESTS: Recent Labs    03/11/20 1434 03/12/20 1811 06/03/20 1446  BILITOT 0.9  --  0.9  AST 20  --  20  ALT 20  --  23  ALKPHOS 71  --  90  PROT 6.9  --  6.8  ALBUMIN 4.0 3.4* 3.9    TUMOR MARKERS: No results for input(s): AFPTM, CEA, CA199, CHROMGRNA in the last 8760 hours.  Assessment and Plan: TIA/stroke Plan for formal cerebral arteriogram tomorrow. Labs reviewed. Risks and benefits of cerebral angio were discussed with the patient including, but not  limited to bleeding, infection, vascular injury or contrast induced renal failure.  This interventional procedure involves the use of X-rays and because of the nature of the planned procedure, it is possible that we will have prolonged use of X-ray fluoroscopy.  Potential radiation risks to you include (but are not limited to) the following: - A slightly elevated risk for cancer  several years later in life. This risk is typically less than 0.5% percent. This risk is low in comparison to the normal incidence of human cancer, which is 33% for women and 50% for men according to the [employer]. - Radiation induced injury can include skin redness, resembling a rash, tissue breakdown / ulcers and hair loss (which can be temporary or permanent).   The likelihood of either of these occurring depends on the difficulty of the procedure and whether you are sensitive to radiation due to previous procedures, disease, or genetic conditions.   IF your procedure requires a prolonged use of radiation, you will be notified and given written instructions for further action.  It is your responsibility to monitor the irradiated area for the 2 weeks following the procedure and to notify your physician if you are concerned that you have suffered a radiation induced injury.    All of the patient's questions were answered, patient is agreeable to proceed.  Consent signed and in chart.  Thank you for this interesting consult.  I greatly enjoyed meeting Islah L Bibbee and look forward to participating in their care.  A copy of this report was sent to the requesting provider on this date.  Electronically Signed: Brayton El, PA-C 06/05/2020, 1:50 PM   I spent a total of 20 minutes in face to face in clinical consultation, greater than 50% of which was counseling/coordinating care for cerebral angio

## 2020-06-05 NOTE — Progress Notes (Signed)
[facility] - 06/05/20 1400      Assessment    Assessment type Face to face    Is patient still in hospital? Yes   Pt was discharged 03/14/20, readmitted 06/03/20     Final 90-Day Modified Rankin Score   Final 90-Day Modified Rankin Score: ([employer]) 1-Some symptoms from stroke remain, but able to carry out all usual activities      [id number]   Mobility 1- no problems in walking about    Self-care 1- no problems with Self-care    Usual activities 1- no problems with performing usual activities (e.g. work, study, housework, family or leisure activities)    Pain/discomfort 1- no pain or discomfort    Anxiety/Depression 1- not anxious or depressed    What number between 0-100 best describes the patient's health state today (100 means the best health; 0 means the worst health)? 55      Hospital Admission   In the Past 3 months (since your initial hospitalisation for stroke), have you been admitted to hospital (including day-only procedures) for any reason? Yes      a. Hospital Admission    a. [facility]    a. Condition or purpose new stroke    a. Total days in hospital 2   Currently admitted   a. Admission start date 06/03/20      Doctor consultations   In the past 3 months (since your initial hospitalisation for stroke), have you seen any doctors or other health professional (for example physiotherapy, outpatient nurse, general practitioner) for any reason? Yes      a. Doctor consultations   a. Type of service Cardiology   Cardiology   a. Condition or purpose Atrial Septal Defect    a. Date of appointment 05/12/20      b. Doctor consultations   b. Type of service Neurology    b. Condition or purpose Cryptogenic Stroke    b. Date of appointment 05/20/20      c. Doctor consultations   c. Type of service Cardiology    c. Condition or purpose Acute Ishemic Stroke    c. Date of appointment 05/20/20

## 2020-06-05 NOTE — Plan of Care (Signed)
  Problem: Education: Goal: Knowledge of disease or condition will improve Outcome: Progressing Goal: Knowledge of patient specific risk factors addressed and post discharge goals established will improve Outcome: Progressing   Problem: Nutrition: Goal: Risk of aspiration will decrease Outcome: Progressing   

## 2020-06-06 ENCOUNTER — Other Ambulatory Visit: Payer: Self-pay

## 2020-06-06 ENCOUNTER — Inpatient Hospital Stay (HOSPITAL_COMMUNITY): Payer: BC Managed Care – PPO

## 2020-06-06 DIAGNOSIS — E785 Hyperlipidemia, unspecified: Secondary | ICD-10-CM | POA: Diagnosis present

## 2020-06-06 DIAGNOSIS — Z8774 Personal history of (corrected) congenital malformations of heart and circulatory system: Secondary | ICD-10-CM

## 2020-06-06 DIAGNOSIS — Z8673 Personal history of transient ischemic attack (TIA), and cerebral infarction without residual deficits: Secondary | ICD-10-CM

## 2020-06-06 HISTORY — PX: IR US GUIDE VASC ACCESS RIGHT: IMG2390

## 2020-06-06 HISTORY — PX: IR ANGIO VERTEBRAL SEL VERTEBRAL BILAT MOD SED: IMG5369

## 2020-06-06 HISTORY — PX: IR ANGIO INTRA EXTRACRAN SEL COM CAROTID INNOMINATE BILAT MOD SED: IMG5360

## 2020-06-06 IMAGING — XA IR ANGIO INTRA EXTRACRAN SEL COM CAROTID INNOMINATE BILAT MOD SE
2 series · 13 of 24 positions shown · IV contrast (IODINE)
Comparison: none

INDICATION: Left-sided paresthesias and facial weakness. Abnormal MRI of the
brain. Cerebral vasculitis suspected.
TECHNIQUE: Informed written consent was obtained from the patient after a
thorough discussion of the procedural risks, benefits and
alternatives. All questions were addressed. Maximal Sterile Barrier
Technique was utilized including caps, mask, sterile gowns, sterile
gloves, sterile drape, hand hygiene and skin antiseptic. A timeout
was performed prior to the initiation of the procedure.

[Series 1: (id) · 1 of 2 slices shown]
[im 1/2]
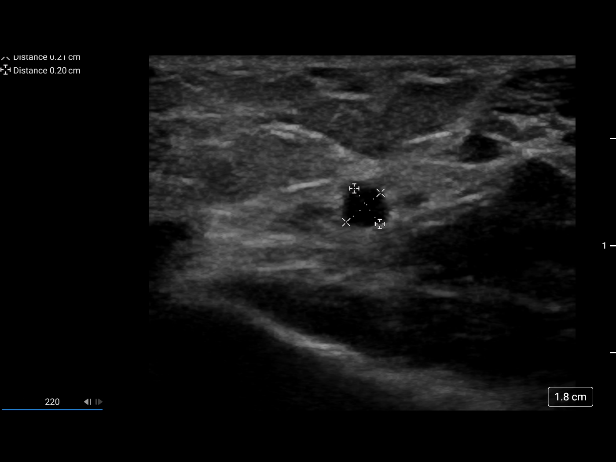

[Series 300: dr. (person_name) · 12 of 184 slices shown]
[im 9/184]
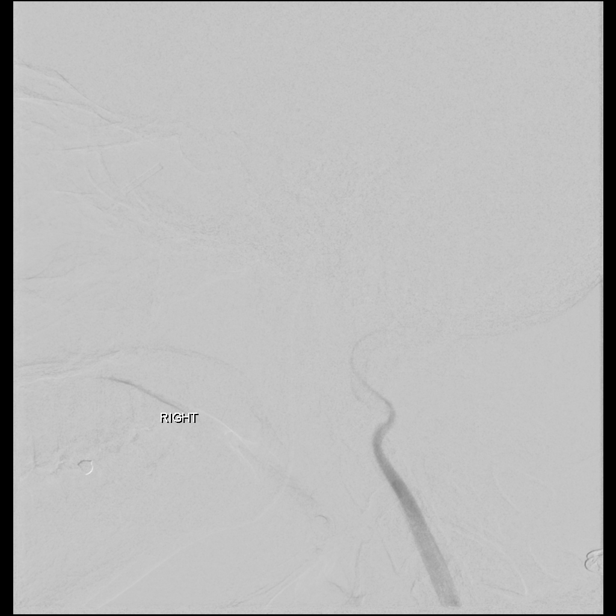
[im 25/184]
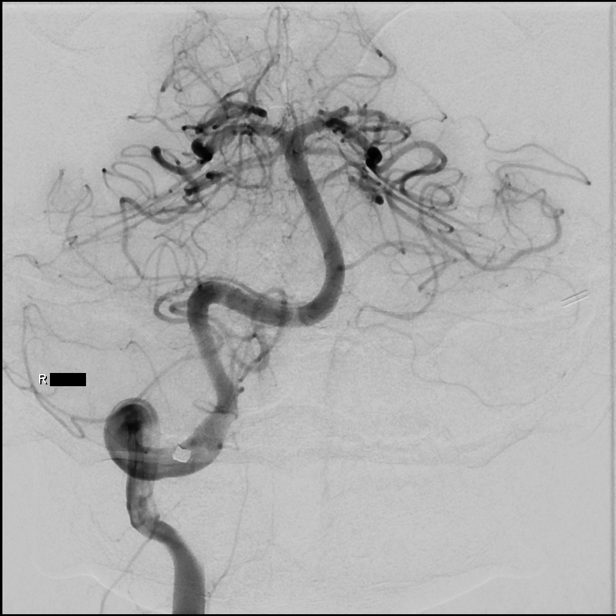
[im 42/184]
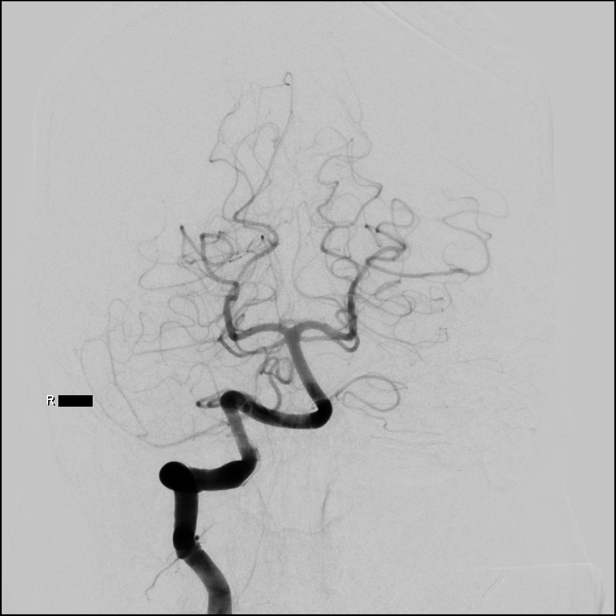
[im 59/184]
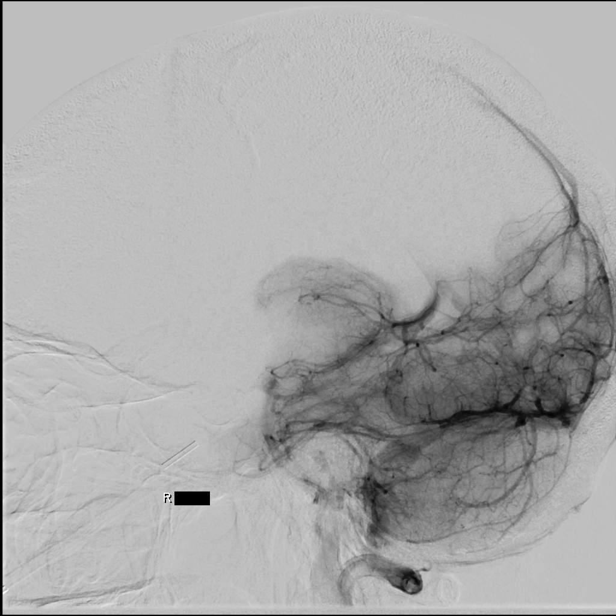
[im 75/184]
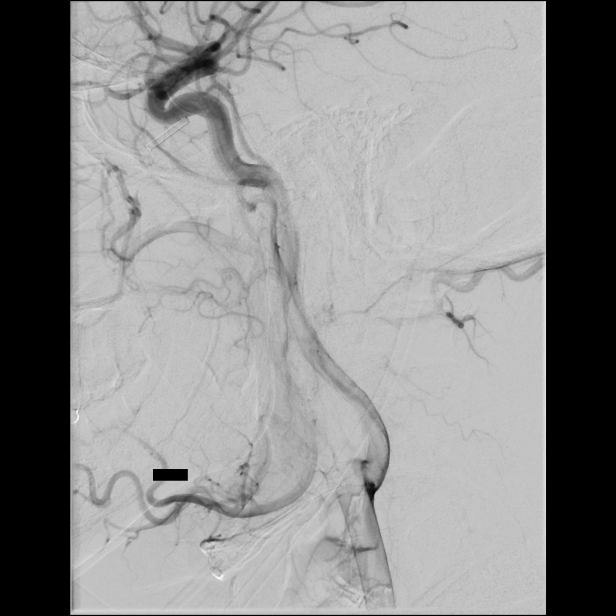
[im 92/184]
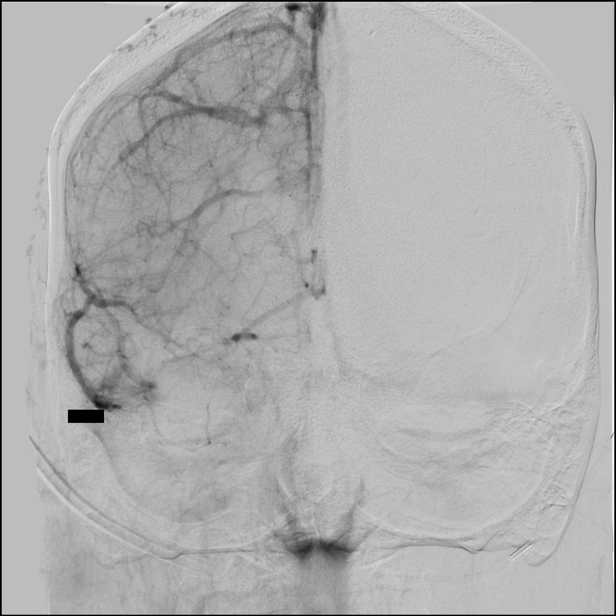
[im 100/184]
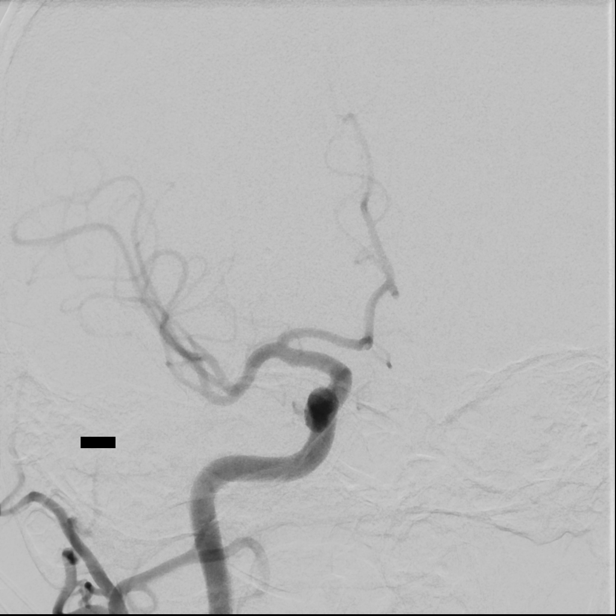
[im 117/184]
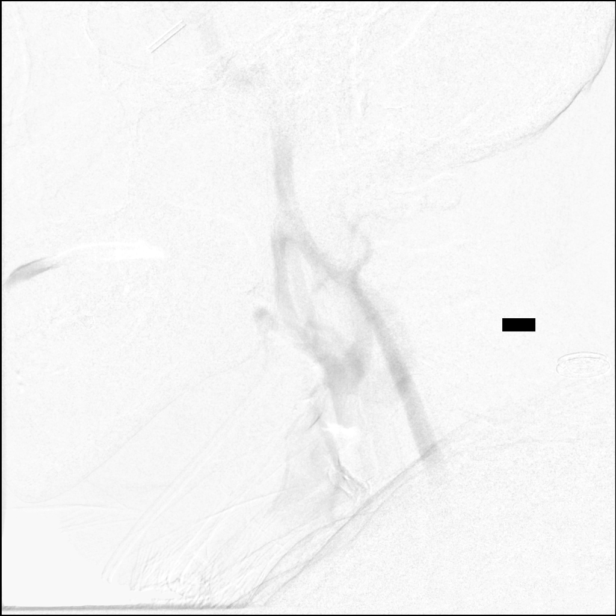
[im 134/184]
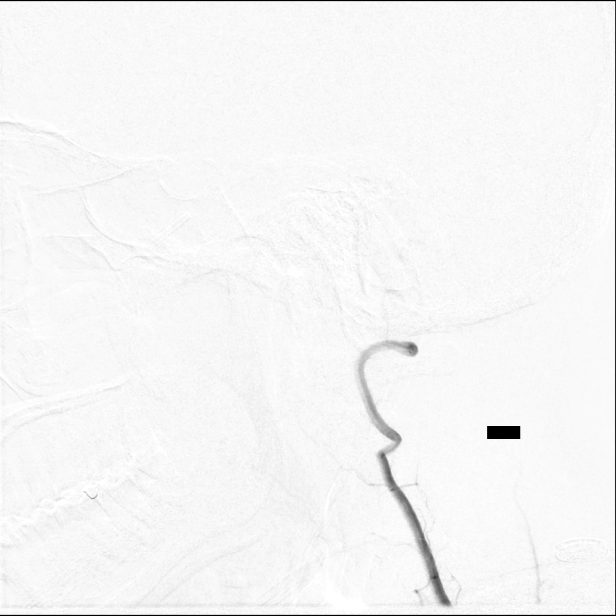
[im 150/184]
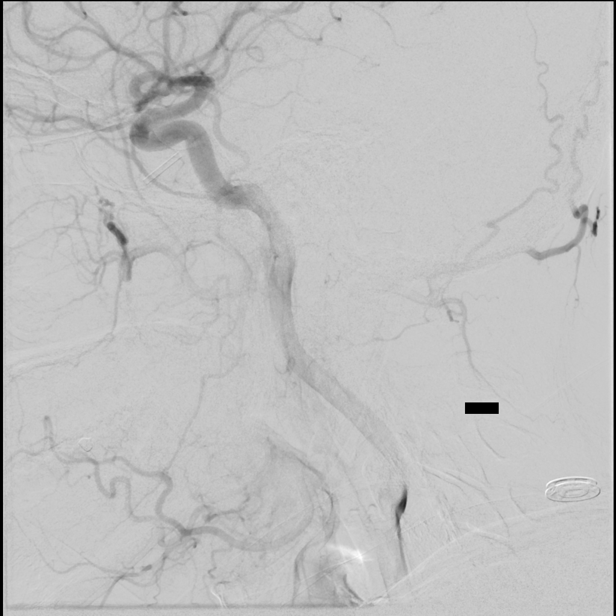
[im 167/184]
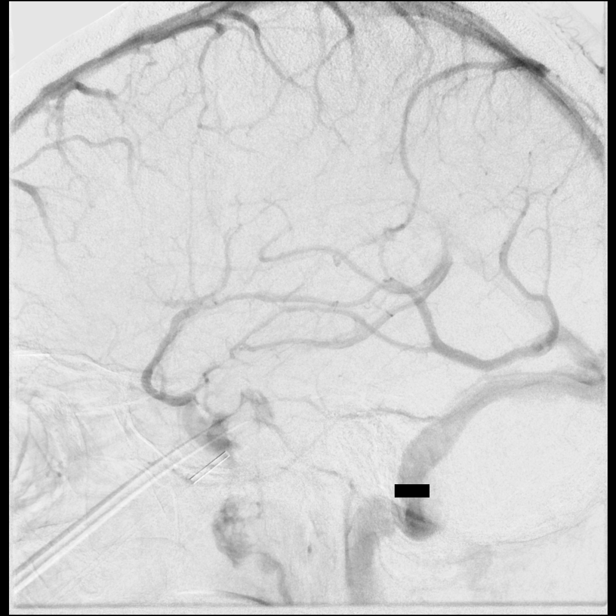
[im 184/184]
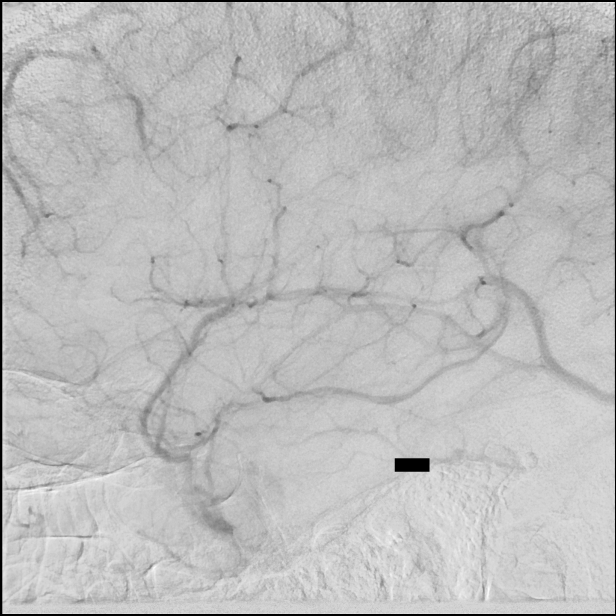

[13 of 24 positions shown; findings below may reference images not displayed]

EXAM:
BILATERAL COMMON CAROTID AND INNOMINATE ANGIOGRAPHY AND BILATERAL
VERTEBRAL ARTERY ANGIOGRAMS

MEDICATIONS:
No antibiotic was administered within 1 hour of the procedure.

ANESTHESIA/SEDATION:
Versed 1 mg IV; Fentanyl 25 mcg IV

Moderate Sedation Time:  49 minutes

The patient was continuously monitored during the procedure by the
interventional radiology nurse under my direct supervision.

FLUOROSCOPY TIME:  Fluoroscopy Time: 20 minutes 42 seconds (816
mGy).

COMPLICATIONS:
None immediate.
PROCEDURE:
The right forearm to the wrist was prepped and draped in the usual
sterile manner. The right radial artery was then identified and its
morphology documented with ultrasound. A dorsal palmar anastomosis
was verified to be present. Using ultrasound guidance, radial access
was obtained using a micro puncture set. Over a 0.018 inch micro
guidewire, a [DATE] French radial sheath was then inserted. The
obturator, and the micro guidewire were removed. Good aspiration
obtained from the side port of the radial sheath. A cocktail of 2
[HK] units of heparin, 2.5 mg of verapamil, and 200 mcg
nitroglycerin was then infused in diluted form through the sheath
without event. A right radial arteriogram was also obtained.

Over a 0.035 inch Roadrunner guidewire, ELKIN MANUEL 2 diagnostic
catheter was then advanced to the aortic arch region and selective
cannulations were performed of the right vertebral artery, the right
common carotid artery, the left common carotid artery and the left
vertebral artery. Following the procedure, hemostasis at the right
radial puncture site was sealed with a radial band. Distal right
radial pulse was verified to be present.
FINDINGS: The dominant right vertebral origin is widely patent.

The vessel is seen to opacify to the cranial skull base. Wide
patency is seen of the right vertebrobasilar junction and the right
posterior-inferior cerebellar artery.

Mild fusiform dilatation of the right vertebral artery distally is
associated with a mild smooth focal irregularity.

More distally the basilar artery, the posterior cerebral arteries,
the superior cerebellar arteries and the anterior-inferior
cerebellar arteries opacify into the capillary and venous phases.

The right common carotid arteriogram demonstrates the right external
carotid artery and its major branches to be widely patent.

The right internal carotid artery at the bulb to the cranial skull
base demonstrates wide patency.

The petrous, cavernous and the supraclinoid segments are widely
patent.

The right middle cerebral artery and the right anterior cerebral
artery opacify into the capillary and venous phases. The origin of
the non dominant left vertebral artery is widely patent.

The vessel opacifies to the cranial skull base at the terminus in
the ipsilateral left posterior-inferior cerebellar artery. The left
common carotid arteriogram demonstrates the left external carotid
artery and its major branches to be widely patent.

The left internal carotid artery at the bulb to the cranial skull
base is widely patent.

The petrous, cavernous and the supraclinoid segments are widely
patent as well.

The left middle cerebral artery and the left anterior cerebral
artery opacify into the capillary and venous phases.

Transient mild cross-filling via the anterior communicating artery
of the right anterior cerebral A2 segment and distally is noted.
IMPRESSION: Angiographically no evidence of occlusions, stenosis, intraluminal
filling defects, dissections or arteriovenous shunting noted
intracranially and extracranially.

Venous outflow within normal limits.

Findings reviewed with the patient and the patient's referring
neurologist.

## 2020-06-06 MED ORDER — VERAPAMIL HCL 2.5 MG/ML IV SOLN
INTRAVENOUS | Status: AC
  Filled 2020-06-06: qty 2

## 2020-06-06 MED ORDER — NITROGLYCERIN 1 MG/10 ML FOR IR/CATH LAB
INTRA_ARTERIAL | Status: AC
  Filled 2020-06-06: qty 10

## 2020-06-06 MED ORDER — FENTANYL CITRATE (PF) 100 MCG/2ML IJ SOLN
INTRAMUSCULAR | Status: AC | PRN
Start: 2020-06-06 — End: 2020-06-06
  Administered 2020-06-06: 25 ug via INTRAVENOUS

## 2020-06-06 MED ORDER — MIDAZOLAM HCL 2 MG/2ML IJ SOLN
INTRAMUSCULAR | Status: AC
  Filled 2020-06-06: qty 2

## 2020-06-06 MED ORDER — ASPIRIN 81 MG PO TBEC: 81.0000 mg | DELAYED_RELEASE_TABLET | Freq: Every day | ORAL | 0 refills | Status: AC

## 2020-06-06 MED ORDER — MIDAZOLAM HCL 2 MG/2ML IJ SOLN
INTRAMUSCULAR | Status: AC | PRN
  Administered 2020-06-06: 1 mg via INTRAVENOUS

## 2020-06-06 MED ORDER — IOHEXOL 300 MG/ML  SOLN: 150.0000 mL | Freq: Once | INTRAMUSCULAR | Status: DC | PRN

## 2020-06-06 MED ORDER — LIDOCAINE HCL 1 % IJ SOLN
INTRAMUSCULAR | Status: AC
  Filled 2020-06-06: qty 20

## 2020-06-06 MED ORDER — SODIUM CHLORIDE 0.9 % IV SOLN
INTRAVENOUS | Status: AC | PRN
  Administered 2020-06-06: 250 mL via INTRAVENOUS

## 2020-06-06 MED ORDER — HEPARIN SODIUM (PORCINE) 1000 UNIT/ML IJ SOLN
INTRAMUSCULAR | Status: AC
  Filled 2020-06-06: qty 1

## 2020-06-06 MED ORDER — CLOPIDOGREL BISULFATE 75 MG PO TABS: 75.0000 mg | ORAL_TABLET | Freq: Every day | ORAL | 2 refills | Status: DC

## 2020-06-06 MED ORDER — FENTANYL CITRATE (PF) 100 MCG/2ML IJ SOLN
INTRAMUSCULAR | Status: AC
  Filled 2020-06-06: qty 2

## 2020-06-06 MED ORDER — IOHEXOL 300 MG/ML  SOLN: 50.0000 mL | Freq: Once | INTRAMUSCULAR | Status: DC | PRN

## 2020-06-06 NOTE — Procedures (Signed)
S/P 4 vessel cerebral arteriogram RT rad approach. Findings. 1 No angiographic evidence of stenosis,occlusions or intraluminal filling defects.Marland Kitchen 2.Venous outflow WNLs S.Emalie Mcwethy MD

## 2020-06-06 NOTE — Discharge Summary (Addendum)
Stroke Discharge Summary  Patient ID: [name]    l   MRN: [id number]      DOB: Oct 24, 1970  Date of Admission: 06/03/2020 Date of Discharge: 06/06/2020  Attending Physician:  Marvel PlanXu, Mckensey Berghuis, MD, Stroke MD Consultant(s):    None  Patient's PCP:  Judy Pimpleower, Marne A, MD  DISCHARGE DIAGNOSIS:  Principal Problem:   Acute ischemic stroke (HCC) R MCA s/p tPA - cryptogenic Active Problems:   Hashimoto thyroiditis   History of ischemic left MCA stroke s/p tPA   History of repair of congenital atrial septal defect (ASD)   Hyperlipidemia LDL goal <70   Allergies as of 06/06/2020      Reactions   Bee Venom Swelling      Medication List    STOP taking these medications   metoprolol tartrate 100 MG tablet Commonly known as: LOPRESSOR   Nurtec 75 MG Tbdp Generic drug: Rimegepant Sulfate     TAKE these medications   aspirin 81 MG EC tablet Take 1 tablet (81 mg total) by mouth daily for 21 days. Swallow whole.   atorvastatin 20 MG tablet Commonly known as: LIPITOR Take 1 tablet (20 mg total) by mouth daily.   clopidogrel 75 MG tablet Commonly known as: PLAVIX Take 1 tablet (75 mg total) by mouth daily. Start taking on: June 07, 2020            Discharge Care Instructions  (From admission, onward)         Start     Ordered   06/06/20 0000  Discharge wound care:       Comments: Per AVS   06/06/20 1651          LABORATORY STUDIES CBC    Component Value Date/Time   WBC 7.2 06/03/2020 1446   RBC 4.24 06/03/2020 1446   HGB 13.6 06/03/2020 1458   HCT 40.0 06/03/2020 1458   PLT 277 06/03/2020 1446   MCV 94.6 06/03/2020 1446   MCH 32.1 06/03/2020 1446   MCHC 33.9 06/03/2020 1446   RDW 12.5 06/03/2020 1446   LYMPHSABS 2.1 06/03/2020 1446   MONOABS 0.5 06/03/2020 1446   EOSABS 0.2 06/03/2020 1446   BASOSABS 0.1 06/03/2020 1446   CMP    Component Value Date/Time   NA 141 06/03/2020 1458   NA 138 05/12/2020 1652   K 3.5 06/03/2020 1458   CL 103 06/03/2020  1458   CO2 23 06/03/2020 1446   GLUCOSE 108 (H) 06/03/2020 1458   BUN 6 06/03/2020 1458   BUN 10 05/12/2020 1652   CREATININE 0.70 06/03/2020 1458   CALCIUM 9.1 06/03/2020 1446   PROT 6.8 06/03/2020 1446   ALBUMIN 3.9 06/03/2020 1446   AST 20 06/03/2020 1446   ALT 23 06/03/2020 1446   ALKPHOS 90 06/03/2020 1446   BILITOT 0.9 06/03/2020 1446   GFRNONAA >60 06/03/2020 1446   GFRAA 106 05/12/2020 1652   COAGS Lab Results  Component Value Date   INR 0.9 06/03/2020   INR 0.9 03/11/2020   Lipid Panel    Component Value Date/Time   CHOL 136 06/04/2020 0355   CHOL 160 05/20/2020 0909   TRIG 109 06/04/2020 0355   HDL 50 06/04/2020 0355   HDL 56 05/20/2020 0909   CHOLHDL 2.7 06/04/2020 0355   VLDL 22 06/04/2020 0355   LDLCALC 64 06/04/2020 0355   LDLCALC 80 05/20/2020 0909   HgbA1C  Lab Results  Component Value Date   HGBA1C 5.3 06/04/2020   Urinalysis  Component Value Date/Time   COLORURINE STRAW (A) 06/03/2020 2133   APPEARANCEUR CLEAR 06/03/2020 2133   LABSPEC 1.011 06/03/2020 2133   PHURINE 7.0 06/03/2020 2133   GLUCOSEU NEGATIVE 06/03/2020 2133   HGBUR SMALL (A) 06/03/2020 2133   BILIRUBINUR NEGATIVE 06/03/2020 2133   BILIRUBINUR neg 12/22/2011 1538   KETONESUR 5 (A) 06/03/2020 2133   PROTEINUR NEGATIVE 06/03/2020 2133   UROBILINOGEN 0.2 12/22/2011 1538   NITRITE NEGATIVE 06/03/2020 2133   LEUKOCYTESUR NEGATIVE 06/03/2020 2133    SIGNIFICANT DIAGNOSTIC STUDIES DG Chest 1 View  Result Date: 06/03/2020 CLINICAL DATA:  Left upper extremity weakness and tingling EXAM: CHEST  1 VIEW COMPARISON:  05/23/2020 FINDINGS: Single frontal view of the chest demonstrates a stable cardiac silhouette. Loop recorder is seen within the left anterior chest wall. No airspace disease, effusion, or pneumothorax. No acute bony abnormalities. IMPRESSION: 1. No acute intrathoracic process. Electronically Signed   By: Sharlet Salina M.D.   On: 06/03/2020 15:57   MR ANGIO HEAD WO  CONTRAST  Addendum Date: 06/04/2020   ADDENDUM REPORT: 06/04/2020 14:58 ADDENDUM: Correction to the technique: MRA of the neck was without and with contrast. Electronically Signed   By: Feliberto Harts MD   On: 06/04/2020 14:58   Result Date: 06/04/2020 CLINICAL DATA:  Stroke follow-up. Migraine. Difficulty moving left arm/hand. EXAM: MRI HEAD WITHOUT CONTRAST MRA HEAD WITHOUT CONTRAST MRA NECK WITHOUT CONTRAST TECHNIQUE: Multiplanar, multiecho pulse sequences of the brain and surrounding structures were obtained without intravenous contrast. Angiographic images of the Circle of Willis were obtained using MRA technique without intravenous contrast. Angiographic images of the neck were obtained using MRA technique without intravenous contrast. Carotid stenosis measurements (when applicable) are obtained utilizing NASCET criteria, using the distal internal carotid diameter as the denominator. COMPARISON:  CTA March 11, 2020.  MRI May 27, 2020. FINDINGS: MRI HEAD FINDINGS Brain: Small acute infarct in the precentral gyrus of the right frontal lobe (series 5, image 87). Mild associated edema without mass effect. 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 No hydrocephalus. No acute hemorrhage. No mass lesion. No midline shift. Skull and upper cervical spine: No focal marrow replacing lesion. Sinuses/Orbits: Small retention cysts in the left maxillary sinus. MRA HEAD FINDINGS No evidence of visible hemodynamically significant proximal stenosis or large vessel occlusion in the anterior or posterior circulation. 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 No aneurysm identified. MRA NECK FINDINGS No evidence of hemodynamically significant stenosis or occlusion. IMPRESSION: 1. Small acute infarct in the precentral gyrus of the right frontal lobe. Mild associated edema without mass effect. 2. No large vessel occlusion or hemodynamically significant proximal  stenosis in the head or neck. Electronically Signed: By: Feliberto Harts MD On: 06/04/2020 14:55   MR ANGIO NECK W WO CONTRAST  Addendum Date: 06/04/2020   ADDENDUM REPORT: 06/04/2020 14:58 ADDENDUM: Correction to the technique: MRA of the neck was without and with contrast. Electronically Signed   By: Feliberto Harts MD   On: 06/04/2020 14:58   Result Date: 06/04/2020 CLINICAL DATA:  Stroke follow-up. Migraine. Difficulty moving left arm/hand. EXAM: MRI HEAD WITHOUT CONTRAST MRA HEAD WITHOUT CONTRAST MRA NECK WITHOUT CONTRAST TECHNIQUE: Multiplanar, multiecho pulse sequences of the brain and surrounding structures were obtained without intravenous contrast. Angiographic images of the Circle of Willis were obtained using MRA technique without intravenous contrast. Angiographic images of the neck were obtained using MRA technique without intravenous contrast. Carotid stenosis measurements (when applicable) are obtained utilizing  NASCET criteria, using the distal internal carotid diameter as the denominator. COMPARISON:  CTA March 11, 2020.  MRI May 27, 2020. FINDINGS: MRI HEAD FINDINGS Brain: Small acute infarct in the precentral gyrus of the right frontal lobe (series 5, image 87). Mild associated edema without mass effect. 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 No hydrocephalus. No acute hemorrhage. No mass lesion. No midline shift. Skull and upper cervical spine: No focal marrow replacing lesion. Sinuses/Orbits: Small retention cysts in the left maxillary sinus. MRA HEAD FINDINGS No evidence of visible hemodynamically significant proximal stenosis or large vessel occlusion in the anterior or posterior circulation. 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 No aneurysm identified. MRA NECK FINDINGS No evidence of hemodynamically significant stenosis or occlusion. IMPRESSION: 1. Small acute infarct in the precentral gyrus of the right frontal  lobe. Mild associated edema without mass effect. 2. No large vessel occlusion or hemodynamically significant proximal stenosis in the head or neck. Electronically Signed: By: Feliberto Harts MD On: 06/04/2020 14:55   MR BRAIN WO CONTRAST  Addendum Date: 06/04/2020   ADDENDUM REPORT: 06/04/2020 14:58 ADDENDUM: Correction to the technique: MRA of the neck was without and with contrast. Electronically Signed   By: Feliberto Harts MD   On: 06/04/2020 14:58   Result Date: 06/04/2020 CLINICAL DATA:  Stroke follow-up. Migraine. Difficulty moving left arm/hand. EXAM: MRI HEAD WITHOUT CONTRAST MRA HEAD WITHOUT CONTRAST MRA NECK WITHOUT CONTRAST TECHNIQUE: Multiplanar, multiecho pulse sequences of the brain and surrounding structures were obtained without intravenous contrast. Angiographic images of the Circle of Willis were obtained using MRA technique without intravenous contrast. Angiographic images of the neck were obtained using MRA technique without intravenous contrast. Carotid stenosis measurements (when applicable) are obtained utilizing NASCET criteria, using the distal internal carotid diameter as the denominator. COMPARISON:  CTA March 11, 2020.  MRI May 27, 2020. FINDINGS: MRI HEAD FINDINGS Brain: Small acute infarct in the precentral gyrus of the right frontal lobe (series 5, image 87). Mild associated edema without mass effect. 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 No hydrocephalus. No acute hemorrhage. No mass lesion. No midline shift. Skull and upper cervical spine: No focal marrow replacing lesion. Sinuses/Orbits: Small retention cysts in the left maxillary sinus. MRA HEAD FINDINGS No evidence of visible hemodynamically significant proximal stenosis or large vessel occlusion in the anterior or posterior circulation. 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 No aneurysm identified. MRA NECK FINDINGS No evidence of hemodynamically  significant stenosis or occlusion. IMPRESSION: 1. Small acute infarct in the precentral gyrus of the right frontal lobe. Mild associated edema without mass effect. 2. No large vessel occlusion or hemodynamically significant proximal stenosis in the head or neck. Electronically Signed: By: Feliberto Harts MD On: 06/04/2020 14:55   MR BRAIN W WO CONTRAST  Result Date: 05/31/2020 [facility] NEUROIMAGING REPORT STUDY DATE: 05/27/20 PATIENT NAME: LORIJEAN HUSSER DOB: 1970/07/08 MRN: [id number] ORDERING CLINICIAN: Ihor Austin, NP CLINICAL HISTORY: 49 year old female with headaches. EXAM: MR BRAIN W WO CONTRAST TECHNIQUE: MRI of the brain without contrast was obtained utilizing 5 mm axial slices with T1, T2, T2 flair, T2 star gradient echo and diffusion weighted views.  T1 sagittal and T2 coronal views were obtained. CONTRAST: 15ml multihance COMPARISON: 03/12/20 MRI IMAGING SITE: [facility] [address] (1.5 Tesla MRI) FINDINGS: Mild left parietal chronic ischemic infarction (stable). No abnormal lesions are seen on diffusion-weighted  views to suggest acute ischemia. The cortical sulci, fissures and cisterns are normal in size and appearance. Lateral, third and fourth ventricle are normal in size and appearance. No extra-axial fluid collections are seen. No evidence of mass effect or midline shift.  No abnormal lesions on post-contrast views. On sagittal views the posterior fossa, pituitary gland and corpus callosum are unremarkable. No evidence of intracranial hemorrhage on gradient echo views. The orbits and their contents, paranasal sinuses and calvarium are notable for mucus thickening in the ethmoid and sphenoid sinuses. Mucus retention cyst in the left maxillary sinus. Intracranial flow voids are present.   MRI brain (with and without) demonstrating: - Mild left parietal chronic ischemic infarction (stable). - Chronic sinusitis changes. - No acute findings. INTERPRETING  PHYSICIAN: Suanne Marker, MD Certified in Neurology, Neurophysiology and Neuroimaging [facility] [address] [place], [place] [address] [phone]   CT CORONARY MORPH W/CTA COR W/SCORE W/CA W/CM &/OR WO/CM  Addendum Date: 05/26/2020   ADDENDUM REPORT: 05/26/2020 22:20 CLINICAL DATA:  49 year old female with h/o CVA (embolic right parieto-occipital region and left frontal lobe infarcts) treated with TPA in 03/2020, no evidence for a PFO (by TEE/bubble study on 03/13/2020), h/o ASD repair in childhood (unknown type). EXAM: Cardiac/Coronary CTA TECHNIQUE: The patient was scanned on a [employer]. FINDINGS: A 100 kV prospective scan was triggered in the descending thoracic aorta at 111 HU's. Axial non-contrast 3 mm slices were carried out through the heart. The data set was analyzed on a dedicated work station and scored using the Agatson method. Gantry rotation speed was 250 msecs and collimation was .6 mm. 100 mg of PO Metoprolol and 0.8 mg of sl NTG was given. The 3D data set was reconstructed in 5% intervals of the 67-82 % of the R-R cycle. Diastolic phases were analyzed on a dedicated work station using MPR, MIP and VRT modes. The patient received 80 cc of contrast. Aorta:  Normal size.  No calcifications.  No dissection. Aortic Valve:  Trileaflet.  No calcifications. Coronary Arteries:  Normal coronary origin.  Right dominance. RCA is a very large dominant artery that gives rise to PDA and PLA. There is no plaque. Left main is a large artery that gives rise to LAD and LCX arteries. Left main has no plaque. LAD is a medium caliber vessel that gives rise to one small diagonal artery and has no plaque. LCX is a small caliber non-dominant artery that has no plaque. Other findings: Normal pulmonary vein drainage into the left atrium (3 on the right and two on the left). Normal left atrial appendage without a thrombus. Normal size of the pulmonary artery. IMPRESSION: 1.  Coronary calcium score of 0. This was 0 percentile for age and sex matched control. 2. Normal coronary origin with right dominance. 3. CAD-RADS 0. No evidence of CAD (0%). Consider non-atherosclerotic causes of chest pain. 4. No evidence for PFO or residual ASD. 5. Normal pulmonary vein drainage into the left atrium (3 on the right and 2 on the left). 6. No thrombus is seen in the left atrial appendage. Electronically Signed   By: Tobias Alexander   On: 05/26/2020 22:20   Result Date: 05/26/2020 EXAM: OVER-READ INTERPRETATION  CT CHEST The following report is an over-read performed by radiologist Dr. Trudie Reed of [facility] on 05/23/2020. This over-read does not include interpretation of cardiac or coronary anatomy or pathology. The coronary calcium score/coronary CTA interpretation by the cardiologist is attached. COMPARISON:  None. FINDINGS: 4 mm right middle lobe pulmonary nodule (axial image 20 of series 12). Within the visualized portions of the thorax there are no other larger more suspicious appearing pulmonary nodules or masses, there is no acute consolidative airspace disease, no pleural effusions, no pneumothorax and no lymphadenopathy. Visualized portions of the upper abdomen are unremarkable. There are no aggressive appearing lytic or blastic lesions noted in the visualized portions of the skeleton. Multiple small metallic densities are noted in the anterior mediastinum. Electronic device implanted in the medial aspect of the left breast, presumably an implantable loop recorder. IMPRESSION: 1. 4 mm right middle lobe pulmonary nodule, nonspecific, but statistically likely benign. No follow-up needed if patient is low-risk. Non-contrast chest CT can be considered in 12 months if patient is high-risk. This recommendation follows the consensus statement: Guidelines for Management of Incidental Pulmonary Nodules Detected on CT Images: From the Fleischner Society 2017; Radiology 2017;  284:228-243. Electronically Signed: By: Trudie Reed M.D. On: 05/23/2020 12:05   ECHOCARDIOGRAM COMPLETE  Result Date: 06/03/2020    ECHOCARDIOGRAM REPORT   Patient Name:   Danaisha L Bekele Date of Exam: 06/03/2020 Medical Rec #:  [id number]      Height:       65.0 in Accession #:    [id number]     Weight:       198.4 lb Date of Birth:  10/21/1970      BSA:          1.971 m Patient Age:    49 years       BP:           103/72 mmHg Patient Gender: F              HR:           57 bpm. Exam Location:  Inpatient Procedure: 2D Echo, Cardiac Doppler, Color Doppler and Intracardiac            Opacification Agent Indications:    Stroke  History:        Patient has prior history of Echocardiogram examinations, most                 recent 03/13/2020. Stroke. PFO closure 30 years ago.  Sonographer:    Sheralyn Boatman RDCS Referring Phys: [id number] ASHISH ARORA  Sonographer Comments: Technically difficult study due to poor echo windows, suboptimal parasternal window and suboptimal apical window. Loop recorder in parasternal window. IMPRESSIONS  1. Left ventricular ejection fraction, by estimation, is 60 to 65%. The left ventricle has normal function. The left ventricle has no regional wall motion abnormalities. Left ventricular diastolic parameters were normal.  2. Right ventricular systolic function is low normal. The right ventricular size is mildly enlarged. There is normal pulmonary artery systolic pressure.  3. Right atrial size was mildly dilated.  4. The mitral valve is grossly normal. Trivial mitral valve regurgitation.  5. The aortic valve is grossly normal. Aortic valve regurgitation is not visualized. No aortic stenosis is present.  6. The inferior vena cava is normal in size with greater than 50% respiratory variability, suggesting right atrial pressure of 3 mmHg. Comparison(s): No significant change from prior study. Conclusion(s)/Recommendation(s): Otherwise normal echocardiogram, with minor abnormalities described in  the report. No intracardiac source of embolism detected on this transthoracic study. A transesophageal echocardiogram is recommended to exclude cardiac source of embolism if clinically indicated. FINDINGS  Left Ventricle: Left ventricular ejection fraction, by estimation, is 60 to 65%. The left ventricle has normal  function. The left ventricle has no regional wall motion abnormalities. Definity contrast agent was given IV to delineate the left ventricular  endocardial borders. The left ventricular internal cavity size was normal in size. There is no left ventricular hypertrophy. Left ventricular diastolic parameters were normal. Right Ventricle: The right ventricular size is mildly enlarged. Right vetricular wall thickness was not well visualized. Right ventricular systolic function is low normal. There is normal pulmonary artery systolic pressure. The tricuspid regurgitant velocity is 2.17 m/s, and with an assumed right atrial pressure of 3 mmHg, the estimated right ventricular systolic pressure is 21.8 mmHg. Left Atrium: Left atrial size was normal in size. Right Atrium: Right atrial size was mildly dilated. Pericardium: There is no evidence of pericardial effusion. Mitral Valve: The mitral valve is grossly normal. Trivial mitral valve regurgitation. Tricuspid Valve: The tricuspid valve is normal in structure. Tricuspid valve regurgitation is mild . No evidence of tricuspid stenosis. Aortic Valve: The aortic valve is grossly normal. Aortic valve regurgitation is not visualized. No aortic stenosis is present. Pulmonic Valve: The pulmonic valve was not well visualized. Pulmonic valve regurgitation is not visualized. Aorta: The aortic root, ascending aorta, aortic arch and descending aorta are all structurally normal, with no evidence of dilitation or obstruction. Venous: The inferior vena cava is normal in size with greater than 50% respiratory variability, suggesting right atrial pressure of 3 mmHg. IAS/Shunts: The  interatrial septum was not well visualized.  LEFT VENTRICLE PLAX 2D LVIDd:         4.10 cm     Diastology LVIDs:         2.80 cm     LV e' medial:    9.14 cm/s LV PW:         0.90 cm     LV E/e' medial:  7.7 LV IVS:        0.90 cm     LV e' lateral:   9.57 cm/s LVOT diam:     1.80 cm     LV E/e' lateral: 7.4 LV SV:         57 LV SV Index:   29 LVOT Area:     2.54 cm  LV Volumes (MOD) LV vol d, MOD A2C: 75.2 ml LV vol d, MOD A4C: 86.9 ml LV vol s, MOD A2C: 27.6 ml LV vol s, MOD A4C: 29.8 ml LV SV MOD A2C:     47.5 ml LV SV MOD A4C:     86.9 ml LV SV MOD BP:      52.1 ml RIGHT VENTRICLE            IVC RV S prime:     7.83 cm/s  IVC diam: 1.50 cm TAPSE (M-mode): 1.6 cm LEFT ATRIUM             Index      RIGHT ATRIUM           Index LA diam:        3.40 cm 1.72 cm/m RA Area:     15.50 cm LA Vol (A2C):   19.1 ml 9.69 ml/m RA Volume:   42.10 ml  21.35 ml/m LA Vol (A4C):   17.0 ml 8.62 ml/m LA Biplane Vol: 18.8 ml 9.54 ml/m  AORTIC VALVE LVOT Vmax:   106.00 cm/s LVOT Vmean:  62.400 cm/s LVOT VTI:    0.224 m  AORTA Ao Root diam: 2.40 cm MITRAL VALVE  TRICUSPID VALVE MV Area (PHT): 2.99 cm    TR Peak grad:   18.8 mmHg MV Decel Time: 254 msec    TR Vmax:        217.00 cm/s MV E velocity: 70.70 cm/s MV A velocity: 61.70 cm/s  SHUNTS MV E/A ratio:  1.15        Systemic VTI:  0.22 m                            Systemic Diam: 1.80 cm Jodelle Red MD Electronically signed by Jodelle Red MD Signature Date/Time: 06/03/2020/7:59:28 PM    Final    CT HEAD CODE STROKE WO CONTRAST  Result Date: 06/03/2020 CLINICAL DATA:  Code stroke.  Left-sided weakness EXAM: CT HEAD WITHOUT CONTRAST TECHNIQUE: Contiguous axial images were obtained from the base of the skull through the vertex without intravenous contrast. COMPARISON:  None. FINDINGS: Brain: There is no acute intracranial hemorrhage, mass effect, or edema. Gray-white differentiation is preserved. Ventricles and sulci are normal in size and  configuration. There is no extra-axial collection. Vascular: No hyperdense vessel. Skull: Unremarkable. Sinuses/Orbits: Polypoid mucosal thickening. Orbits are unremarkable. Other: Mastoid air cells are clear. ASPECTS ([place] Stroke Program Early CT Score) - Ganglionic level infarction (caudate, lentiform nuclei, internal capsule, insula, M1-M3 cortex): 7 - Supraganglionic infarction (M4-M6 cortex): 3 Total score (0-10 with 10 being normal): 10 IMPRESSION: There is no acute intracranial hemorrhage or evidence of acute infarction. ASPECT score is 10. These results were communicated to Dr. Wilford Corner at 2:59 pm on 06/03/2020 by text page via the [facility] messaging system. Electronically Signed   By: Guadlupe Spanish M.D.   On: 06/03/2020 15:02      HISTORY OF PRESENT ILLNESS [name] is a 49 y.o. left-hand-dominant female who has a past medical history of Hashimoto thyroiditis, ASD closure 30 years ago, embolic appearing right parietal occipital and left frontal infarcts in September 2021 status post IV TPA with near complete resolution of all symptoms on discharge and some intermittent speech-word finding difficulty and a normal MRI a few days ago, presented with sudden onset of left arm numbness and weakness.  She was returning home for her lunch break when she noticed suddenly that her left arm is difficult to lift-she felt heaviness as well as numbness over her left arm.  She did not feel much symptoms in her face but felt her leg was also weak and difficult to walk. She denied any headaches at this time. She had recently had a outpatient evaluation and loop recorder placement by cardiology. She was seen in the stroke follow-up clinic 05/20/2020 and was transition from dual antiplatelet to aspirin only. Her TEE as part of the last stroke work-up was suggestive of a small interatrial level shunt although not very clear.  Because of the recent stroke, embolic work-up with loop recorder was performed.  No A. fib  noted as of yet. LKW: 1:30 PM on 06/03/2020. tpa given?:  Yes. Premorbid modified Rankin scale (mRS): 1  HOSPITAL COURSE Ms. Camilah ROBYNN MARCEL is a 49 y.o. 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 Received tPA 06/03/2020 at 1458.  Stroke:  Small  R frontal lobe infarct  s/p tPA, embolic secondary to unknown source  MRI w/w/o 11/27 old L parietal infarct. Chronic sinusitis. (done for new onset HA)  Code Stroke CT head No acute abnormality. ASPECTS 10.     MRI small R frontal precentral gyrus infarct   MRA head and neck no LVO  TEE 03/2020 - small  IA shunt -later reviewed by Dr. Excell Seltzer, no significant IA shunt  Cardiac CT 11/19 - no CAD, no PFO or IA shunt  2D Echo EF 60-65%. No source of embolus. RA mildly dilated.   Loop interrogation NO AF or other arrhythmia    LP was declined  cerebral angiogram neg for vasculitis  LDL 64  HgbA1c 5.3  aspirin 81 mg daily prior to admission (DAPT up til 05/20/2020), now on aspirin 81 mg daily and clopidogrel 75 mg daily. Continue DAPT for now and follow up with Dr. 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 DAPT ->aspirin only 05/20/2020.  CT head and neck negative.  EF 50 to 55%.  TCD bubble study negative for PFO and LE venous Doppler negative for DVT.  Hypercoagulable and autoimmune work-up negative.  Initial TEE concerning for possible IA shunt (previously closed ASD).  However, on review from Dr. Excell Seltzer, no significant IA shunt  Cardiac CT 11/19 no CAD or PFO or IA shunt  loop placed 05/20/2020  History of ASD  ASD closure 30 yrs ago at  [facility],   small IA shunt seen during TEE 03/2020, but not significant by [facility]   Cardiac CT 05/2020 no IA shunt  Hyperlipidemia  Home meds:  lipitor 20  No intensive statin given LDL less than goal  LDL 64, goal < 70  Continue statin at discharge  Hashimoto's thyroiditis  Diagnosed 17 years ago  03/2020, TSH6.13 (H), but FT4 normal, Thyroid peroxidase Ab55 (H), Thyroglobulin Ab- 1.5 (H)  This admission TSH normal and free T4 normal  Has not on synthroid yet, waiting for endocrinologist appointment in 07/2020  Per literature, Hashimoto's thyroiditis is associated with slightly increased stroke risk, especially within the first year of diagnosis due to thyroid hormone deficiency  Other Stroke Risk Factors  Former Cigarette smoker  Mild ETOH use (1/wk), advised to drink no more than 1 drink(s) a day  Obesity, Body mass index is 33.02 kg/m., BMI >/= 30 associated with increased stroke risk, recommend weight Gutierrez, diet and exercise as appropriate   Hx menstrual migraines  Other Active Problems  New onset HA w/ normal MRI. Trial Nurtec prn  DISCHARGE EXAM Blood pressure 96/64, pulse 76, temperature 98.6 F (37 C), temperature source Oral, resp. rate 20, weight 90 kg, SpO2 96 %. General - Well nourished, well developed, in no apparent distress.  Ophthalmologic - fundi not visualized due to noncooperation.  Cardiovascular - Regular rhythm and rate.  Mental Status -  Level of arousal and orientation to time, place, and person were intact. Language including expression, naming, repetition, comprehension was assessed and found intact. Fund of Knowledge was assessed and was intact.  Cranial Nerves II - XII - II - Visual field intact OU. III, IV, VI - Extraocular movements intact. V - Facial sensation intact bilaterally. VII - Facial movement intact bilaterally. VIII - Hearing & vestibular intact bilaterally. X - Palate elevates symmetrically. XI  - Chin turning & shoulder shrug intact bilaterally. XII -  Tongue protrusion intact.  Motor Strength - The patient's strength was normal in all extremities and pronator drift was absent.  Bulk was normal and fasciculations were absent.   Motor Tone - Muscle tone was assessed at the neck and appendages and was normal.  Reflexes - The patient's reflexes were symmetrical in all extremities and she had no pathological reflexes.  Sensory - Light touch, temperature/pinprick were assessed and were symmetrical.    Coordination - The patient had normal movements in the hands and feet with no ataxia or dysmetria.  Tremor was absent.  Gait and Station - deferred.  Discharge Diet   Heart healthy thin liquids  DISCHARGE PLAN  Disposition:  Return home  aspirin 81 mg daily and clopidogrel 75 mg daily for secondary stroke prevention for 3 weeks then PLAVIX alone.  Ongoing stroke risk factor control by Primary Care Physician at time of discharge  Follow-up PCP [place], Audrie Gallus, MD in 2 weeks.  Follow-up in [facility] with Dr. Pearlean Brownie in 4 weeks, office to schedule an appointment.   Follow up endocrinologist Dr. Talmage Nap for thyroid care ASAP.  35 minutes were spent preparing discharge.  Marvel Plan, MD PhD Stroke Neurology 06/06/2020 7:28 PM

## 2020-06-06 NOTE — Sedation Documentation (Signed)
O2/ETCO2 monitor removed per md 

## 2020-06-09 ENCOUNTER — Telehealth: Payer: Self-pay | Admitting: Family Medicine

## 2020-06-09 NOTE — Telephone Encounter (Signed)
That is fine- she has had strokes recently and needs to keep a pcp (as well as neurology)  Schedule for visit to re est and tell her she will technically be a new pt (sent paperwork)

## 2020-06-09 NOTE — Telephone Encounter (Signed)
Last OV was 12/22/11, please advise

## 2020-06-09 NOTE — Telephone Encounter (Signed)
PT CALLED IN WANTED TO KNOW IF SHE CAN GET SEEN BY DR. Milinda Antis IT HAS BEEN SINCE 2014

## 2020-06-10 ENCOUNTER — Other Ambulatory Visit: Payer: Self-pay

## 2020-06-10 DIAGNOSIS — R911 Solitary pulmonary nodule: Secondary | ICD-10-CM

## 2020-06-10 NOTE — Patient Outreach (Signed)
[facility]) Care Management  06/10/2020  [name] 09-07-1970 [id number]   Telephone outreach to patient to obtain mRS was successfully completed by [name] 06/05/20 MRS=1  [name] [facility] Care Management Assistant

## 2020-06-10 NOTE — Telephone Encounter (Signed)
I left a message on patient's voice mail to return my call.  Please schedule appointment. 

## 2020-06-11 ENCOUNTER — Encounter (HOSPITAL_COMMUNITY): Payer: Self-pay

## 2020-06-11 NOTE — Telephone Encounter (Signed)
Denisha scheduled patient on 06/20/20.  Wayne Both said patient will come by the office on Friday to pick up new patient paperwork.

## 2020-06-13 ENCOUNTER — Other Ambulatory Visit (HOSPITAL_COMMUNITY): Payer: Self-pay | Admitting: Endocrinology

## 2020-06-13 ENCOUNTER — Other Ambulatory Visit: Payer: Self-pay | Admitting: Endocrinology

## 2020-06-13 DIAGNOSIS — E063 Autoimmune thyroiditis: Secondary | ICD-10-CM | POA: Diagnosis not present

## 2020-06-13 DIAGNOSIS — E039 Hypothyroidism, unspecified: Secondary | ICD-10-CM | POA: Diagnosis not present

## 2020-06-13 DIAGNOSIS — E049 Nontoxic goiter, unspecified: Secondary | ICD-10-CM | POA: Diagnosis not present

## 2020-06-13 DIAGNOSIS — I6340 Cerebral infarction due to embolism of unspecified cerebral artery: Secondary | ICD-10-CM | POA: Diagnosis not present

## 2020-06-20 ENCOUNTER — Ambulatory Visit: Payer: BC Managed Care – PPO | Admitting: Family Medicine

## 2020-06-23 ENCOUNTER — Ambulatory Visit (HOSPITAL_COMMUNITY)
Admission: RE | Admit: 2020-06-23 | Discharge: 2020-06-23 | Disposition: A | Discharge status: Home / Self Care | Payer: BC Managed Care – PPO | Source: Ambulatory Visit | Attending: Endocrinology | Admitting: Endocrinology

## 2020-06-23 ENCOUNTER — Ambulatory Visit (HOSPITAL_COMMUNITY): Payer: BC Managed Care – PPO

## 2020-06-23 ENCOUNTER — Ambulatory Visit (INDEPENDENT_AMBULATORY_CARE_PROVIDER_SITE_OTHER): Payer: BC Managed Care – PPO

## 2020-06-23 ENCOUNTER — Other Ambulatory Visit: Payer: Self-pay

## 2020-06-23 DIAGNOSIS — I639 Cerebral infarction, unspecified: Secondary | ICD-10-CM | POA: Diagnosis not present

## 2020-06-23 DIAGNOSIS — E049 Nontoxic goiter, unspecified: Secondary | ICD-10-CM

## 2020-06-23 LAB — CUP PACEART REMOTE DEVICE CHECK
Date Time Interrogation Session: 20211220084527
Implantable Pulse Generator Implant Date: 20211116

## 2020-06-24 ENCOUNTER — Ambulatory Visit (HOSPITAL_COMMUNITY): Payer: BC Managed Care – PPO

## 2020-06-30 ENCOUNTER — Telehealth: Payer: Self-pay | Admitting: Hematology and Oncology

## 2020-06-30 NOTE — Telephone Encounter (Signed)
Received a new hem referral from dr. Talmage Nap for embolic stroke of unknownn origin hypercoaguble. [name] returned my call and has been scheduled to see Dr. Leonides Schanz on 12/21 at 2pm. Pt aware to arrive 30 minutes early.

## 2020-07-01 ENCOUNTER — Encounter: Payer: Self-pay | Admitting: Adult Health

## 2020-07-01 ENCOUNTER — Telehealth: Payer: Self-pay

## 2020-07-01 ENCOUNTER — Encounter: Payer: Self-pay | Admitting: Neurology

## 2020-07-01 ENCOUNTER — Ambulatory Visit (INDEPENDENT_AMBULATORY_CARE_PROVIDER_SITE_OTHER): Payer: BC Managed Care – PPO | Admitting: Neurology

## 2020-07-01 VITALS — BP 118/69 | HR 80 | Ht 65.0 in | Wt 195.8 lb

## 2020-07-01 DIAGNOSIS — I639 Cerebral infarction, unspecified: Secondary | ICD-10-CM

## 2020-07-01 MED ORDER — ATORVASTATIN CALCIUM 20 MG PO TABS
20.0000 mg | ORAL_TABLET | Freq: Every day | ORAL | 0 refills | Status: DC
Start: 2020-07-01 — End: 2020-07-18

## 2020-07-01 NOTE — Patient Instructions (Signed)
I had a long d/w patient and her mother about her recent recurrent cryptogenic strokes, risk for recurrent stroke/TIAs, personally independently reviewed imaging studies and stroke evaluation results and answered questions.Continue Plavix 75 mg daily for secondary stroke prevention and maintain strict control of hypertension with blood pressure goal below 130/90, diabetes with hemoglobin A1c goal below 6.5% and lipids with LDL cholesterol goal below 70 mg/dL. I also advised the patient to eat a healthy diet with plenty of whole grains, cereals, fruits and vegetables, exercise regularly and maintain ideal body weight.  She was advised to consider possible participation in the [place] stroke study for stroke prevention and if interested we will screen and see if she qualifies.  She was given a refill of Lipitor upon request and advised to get future refills from primary care physician.  Followup in the future with me in 6 months or call earlier if necessary.  Stroke Prevention Some medical conditions and behaviors are associated with a higher chance of having a stroke. You can help prevent a stroke by making nutrition, lifestyle, and other changes, including managing any medical conditions you may have. What nutrition changes can be made?   Eat healthy foods. You can do this by: ? Choosing foods high in fiber, such as fresh fruits and vegetables and whole grains. ? Eating at least 5 or more servings of fruits and vegetables a day. Try to fill half of your plate at each meal with fruits and vegetables. ? Choosing lean protein foods, such as lean cuts of meat, poultry without skin, fish, tofu, beans, and nuts. ? Eating low-fat dairy products. ? Avoiding foods that are high in salt (sodium). This can help lower blood pressure. ? Avoiding foods that have saturated fat, trans fat, and cholesterol. This can help prevent high cholesterol. ? Avoiding processed and premade foods.  Follow your health care  provider's specific guidelines for losing weight, controlling high blood pressure (hypertension), lowering high cholesterol, and managing diabetes. These may include: ? Reducing your daily calorie intake. ? Limiting your daily sodium intake to 1,500 milligrams (mg). ? Using only healthy fats for cooking, such as olive oil, canola oil, or sunflower oil. ? Counting your daily carbohydrate intake. What lifestyle changes can be made?  Maintain a healthy weight. Talk to your health care provider about your ideal weight.  Get at least 30 minutes of moderate physical activity at least 5 days a week. Moderate activity includes brisk walking, biking, and swimming.  Do not use any products that contain nicotine or tobacco, such as cigarettes and e-cigarettes. If you need help quitting, ask your health care provider. It may also be helpful to avoid exposure to secondhand smoke.  Limit alcohol intake to no more than 1 drink a day for nonpregnant women and 2 drinks a day for men. One drink equals 12 oz of beer, 5 oz of wine, or 1 oz of hard liquor.  Stop any illegal drug use.  Avoid taking birth control pills. Talk to your health care provider about the risks of taking birth control pills if: ? You are over 59 years old. ? You smoke. ? You get migraines. ? You have ever had a blood clot. What other changes can be made?  Manage your cholesterol levels. ? Eating a healthy diet is important for preventing high cholesterol. If cholesterol cannot be managed through diet alone, you may also need to take medicines. ? Take any prescribed medicines to control your cholesterol as told by your health  care provider.  Manage your diabetes. ? Eating a healthy diet and exercising regularly are important parts of managing your blood sugar. If your blood sugar cannot be managed through diet and exercise, you may need to take medicines. ? Take any prescribed medicines to control your diabetes as told by your health  care provider.  Control your hypertension. ? To reduce your risk of stroke, try to keep your blood pressure below 130/80. ? Eating a healthy diet and exercising regularly are an important part of controlling your blood pressure. If your blood pressure cannot be managed through diet and exercise, you may need to take medicines. ? Take any prescribed medicines to control hypertension as told by your health care provider. ? Ask your health care provider if you should monitor your blood pressure at home. ? Have your blood pressure checked every year, even if your blood pressure is normal. Blood pressure increases with age and some medical conditions.  Get evaluated for sleep disorders (sleep apnea). Talk to your health care provider about getting a sleep evaluation if you snore a lot or have excessive sleepiness.  Take over-the-counter and prescription medicines only as told by your health care provider. Aspirin or blood thinners (antiplatelets or anticoagulants) may be recommended to reduce your risk of forming blood clots that can lead to stroke.  Make sure that any other medical conditions you have, such as atrial fibrillation or atherosclerosis, are managed. What are the warning signs of a stroke? The warning signs of a stroke can be easily remembered as BEFAST.  B is for balance. Signs include: ? Dizziness. ? Loss of balance or coordination. ? Sudden trouble walking.  E is for eyes. Signs include: ? A sudden change in vision. ? Trouble seeing.  F is for face. Signs include: ? Sudden weakness or numbness of the face. ? The face or eyelid drooping to one side.  A is for arms. Signs include: ? Sudden weakness or numbness of the arm, usually on one side of the body.  S is for speech. Signs include: ? Trouble speaking (aphasia). ? Trouble understanding.  T is for time. ? These symptoms may represent a serious problem that is an emergency. Do not wait to see if the symptoms will go  away. Get medical help right away. Call your local emergency services (911 in the [place] Do not drive yourself to the hospital.  Other signs of stroke may include: ? A sudden, severe headache with no known cause. ? Nausea or vomiting. ? Seizure. Where to find more information For more information, visit:  American Stroke Association: www.strokeassociation.org  National Stroke Association: www.stroke.org Summary  You can prevent a stroke by eating healthy, exercising, not smoking, limiting alcohol intake, and managing any medical conditions you may have.  Do not use any products that contain nicotine or tobacco, such as cigarettes and e-cigarettes. If you need help quitting, ask your health care provider. It may also be helpful to avoid exposure to secondhand smoke.  Remember BEFAST for warning signs of stroke. Get help right away if you or a loved one has any of these signs. This information is not intended to replace advice given to you by your health care provider. Make sure you discuss any questions you have with your health care provider. Document Revised: 06/03/2017 Document Reviewed: 07/27/2016 Elsevier Patient Education  2020 [employer].

## 2020-07-01 NOTE — Progress Notes (Signed)
[facility] [address] [place]. [place] [address] [phone]       HOSPITAL FOLLOW UP NOTE  Ms. [name] Date of Birth:  10/17/1970 Medical Record Number:  [id number]   Reason for Referral:  hospital stroke follow up    SUBJECTIVE:   CHIEF COMPLAINT:  Chief Complaint  Patient presents with  . 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  Personally reviewed hospitalization pertinent progress notes, lab work and imaging with summary provided.  Evaluated by Dr. Roda Shutters with stroke work-up revealing right parietal occipital region and left frontal lobe small infarcts s/p tPA, infarcts embolic secondary to unknown source concerning for ASD/PFO related as TEE positive for PFO.  Refer to Dr. Excell Seltzer for outpatient evaluation of PFO.  Hypercoagulable and autoimmune labs pending at discharge.  Recommended DAPT until evaluation with Dr. Excell Seltzer outpatient.  LDL 94 and initiate atorvastatin 20 mg daily. No hx of HTN or DM.  History of Hashimoto thyroiditis not on Synthroid PTA per husband with TSH 6.13 (but FT4 normal), thyroid peroxidase Ab 55 and thyroglobulin Ab 1.5 and advised follow-up with PCP outpatient for further management.  Her stroke risk factors include hx of ASD closure at age 72 and obesity.  No prior stroke history.  Evaluated by therapies and discharged home in stable condition without therapy needs on 03/14/2020.  Stroke: right parieto-occipital region and left frontal lobe small infarcts, embolic pattern, source uncertain, concerning for ASD/PFO related  CT head no acute finding  CTA head and neck neg  MRI Brain right parieto-occipital region and left frontal lobe small infartcts  MRI C-spine normal cervical spinal  cord  2D Echo EF 50-55%  TEE unremarkable butpositive for PFO  TCD bubble study - negative  LE venous doppler - negative  LDL 94  HgbA1c 5.3  UDS neg  hypercoag and autoimmune labs - so far neg, rest pending  No indication for LP or heart monitoring at this time - will refer to Dr. Pearlean Brownie for second opinion  SCDs for VTE prophylaxis  No antithromboticprior to admission, now on ASA 81 and plavix 75 DAPT for stroke prevention. Continue DAPT until sees Dr. Excell Seltzer as outpt.  Patient counseled to be compliant withherantithrombotic medications  Ongoing aggressive stroke risk factor management  Therapy recommendations: None  Disposition: Discharge to home   Today, 05/20/2020, [name] is being seen for hospital follow-up accompanied by her husband.  She reports initially doing well at discharge but over the past month (approx 1 month post discharge), she has been experiencing word finding difficulties as well as frequent bilateral temporal and occipital headaches.  She does have history of migraines but has not experienced a migraine "in many years".  Headache consistent with throbbing sensation but denies photophobia, phonophobia or nausea/vomiting.  Can occur up to 2-3 times monthly and may last for 3 to 4 days.  She will take Tylenol with some benefit.  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  She has remained on both aspirin and Plavix without bleeding or bruising as well as atorvastatin 10 mg daily without myalgias.  Further review of hypercoagulable and autoimmune labs pending  at discharge unremarkable.  She was evaluated by Dr. Excell Seltzer on 05/12/2020 and personally reviewed the office note. Per Dr. Excell Seltzer, he reviewed TEE and I did not appreciate any clear evidence of PFO and TCD negative therefore recommended pursuing cardiac CTA to further assess for cardiac  anomalies which is scheduled for this Friday.  He believes increased risk of atrial fibrillation with history of prior heart surgery and continued presence of right atrial dilation and tricuspid regurgitation and referred to EP for further consideration of loop recorder.  She has appointment scheduled today with Dr. Lalla Brothers for further discussion of loop recorder.  No further concerns at this time.  Update 07/01/2020: Patient returns for follow-up after last visit with [name] 6 weeks ago.  She was readmitted to [facility] on 06/03/2020 with sudden onset of left arm weakness and difficulty lifting and heaviness.  This happened a few days after she had switched from dual antiplatelet therapy to aspirin only.  MRI scan showed a right frontal precentral gyrus embolic infarct of cryptogenic etiology.  MRA of the brain and neck showed no large vessel stenosis or occlusion.  She had previously had a cardiac CT in 05/23/2020 which had shown no PFO or shunt and 2D echo and TEE in September which were unremarkable.  Loop interrogation this admission showed no paroxysmal A. fib.  LP was offered but she declined.  Cerebral angiogram was obtained to look for cerebral vasculitis but was negative.  LDL cholesterol was 64 mg percent and hemoglobin A1c was 5.3.  She was restarted on dual antiplatelet therapy aspirin 81 mg and Plavix 75 mg daily and continued deferred to come back to see me today.  She states she is done well.  Her left arm weakness has resolved completely.  She has slightly diminished fine motor skills in the left hand but no real weakness.  She is tolerating Plavix well without bruising or bleeding as well as Lipitor without muscle aches and pains.  Her blood pressures well controlled today it is 118/69.  She has no new complaints.  She is eating healthy and losing weight   ROS:   14 system review of systems performed and negative with exception of those listed in HPI  PMH:  Past Medical History:   Diagnosis Date  . Hx of migraines   . Stroke [facility])    06/03/20    PSH:  Past Surgical History:  Procedure Laterality Date  . BUBBLE STUDY  03/13/2020   Procedure: BUBBLE STUDY;  Surgeon: Quintella Reichert, MD;  Location: [facility] ENDOSCOPY;  Service: Cardiovascular;;  . ENDOMETRIAL ABLATION     Uterine/heavy menses  . IR ANGIO INTRA EXTRACRAN SEL COM CAROTID INNOMINATE BILAT MOD SED  06/06/2020  . IR ANGIO VERTEBRAL SEL VERTEBRAL BILAT MOD SED  06/06/2020  . IR US GUIDE VASC ACCESS RIGHT  06/06/2020  . SHOULDER SURGERY     Left,/bone spurs  . TEE WITHOUT CARDIOVERSION N/A 03/13/2020   Procedure: TRANSESOPHAGEAL ECHOCARDIOGRAM (TEE);  Surgeon: Quintella Reichert, MD;  Location: [facility] ENDOSCOPY;  Service: Cardiovascular;  Laterality: N/A;    Social History:  Social History   Socioeconomic History  . Marital status: Married    Spouse name: Not on file  . Number of children: Not on file  . Years of education: Not on file  . Highest education level: Not on file  Occupational History  . Occupation: full time  Tobacco Use  . Smoking status: Former Games developer  . Smokeless tobacco: Never Used  Substance and Sexual Activity  . Alcohol use: Yes    Alcohol/week: 1.0 standard drink    Types: 1 Cans of beer per week    Comment: states 1 beer every other day or so   . Drug use: Not on file  . Sexual activity: Not on file  Other Topics Concern  . Not on file  Social History Narrative   Last Mamm: 04.07.10 @ [facility] BI-RADS Category 1: Negative^MM DIGITAL DG BILATERAL   Lives with husband and 2 daughters   Left Handed   Drinks no caffeine   Social Determinants of Health   Financial Resource Strain: Not on file  Food Insecurity: Not on file  Transportation Needs: No Transportation Needs  . Lack of Transportation (Medical): No  . Lack of Transportation (Non-Medical): No  Physical Activity: Not on file  Stress: Not on file  Social Connections: Not on file  Intimate Partner Violence: Not on file     Family History:  Family History  Problem Relation Age of Onset  . Hypertension Mother   . Hypothyroidism Mother   . High Cholesterol Mother   . Atrial fibrillation Mother   . COPD Father   . Emphysema Father   . Hypothyroidism Sister     Medications:   Current Outpatient Medications on File Prior to Visit  Medication Sig Dispense Refill  . aspirin EC 81 MG tablet Take 81 mg by mouth daily. Swallow whole.    . clopidogrel (PLAVIX) 75 MG tablet Take 1 tablet (75 mg total) by mouth daily. 30 tablet 2   No current facility-administered medications on file prior to visit.    Allergies:   Allergies  Allergen Reactions  . Bee Venom Swelling      OBJECTIVE:  Physical Exam  Vitals:   07/01/20 1242  BP: 118/69  Pulse: 80  Weight: 195 lb 12.8 oz (88.8 kg)  Height: 5\' 5"  (1.651 m)   Body mass index is 32.58 kg/m. No exam data present  Post stroke PHQ 2/9 Depression screen PHQ 2/9 05/20/2020  Decreased Interest 0  Down, Depressed, Hopeless 0  PHQ - 2 Score 0     General: well developed, well nourished, pleasant middle-age Caucasian female, seated, in no evident distress Head: head normocephalic and atraumatic.   Neck: supple with no carotid or supraclavicular bruits Cardiovascular: regular rate and rhythm, no murmurs Musculoskeletal: no deformity Skin:  no rash/petichiae Vascular:  Normal pulses all extremities   Neurologic Exam Mental Status: Awake and fully alert.  Occasional speech hesitancy but no clear evidence of dysarthria or aphasia.  Oriented to place and time. Recent and remote memory intact. Attention span, concentration and fund of knowledge appropriate. Mood and affect appropriate.  Cranial Nerves: Fundoscopic exam reveals sharp disc margins. Pupils equal, briskly reactive to light. Extraocular movements full without nystagmus. Visual fields full to confrontation. Hearing intact. Facial sensation intact.  No facial weakness tongue, palate moves  normally and symmetrically.  Motor: Normal bulk and tone. Normal strength in all tested extremity muscles.  Diminished fine finger movements on the left.  Trace weakness of left grip.  Orbits right over left upper extremity. Sensory.: intact to touch , pinprick , position and vibratory sensation.  Coordination: Rapid alternating movements normal in all extremities. Finger-to-nose and heel-to-shin performed accurately bilaterally. Gait and Station: Arises from chair without difficulty. Stance is normal. Gait demonstrates normal stride length and balance Reflexes: 1+ and symmetric. Toes downgoing.     NIHSS  0 Modified Rankin  1  ASSESSMENT: Jamie Rippermy L Candee is a 49 y.o. year old female presented with right arm and leg weakness on 03/11/2020 with stroke work-up revealing right parieto-occipital region and left frontal temporal lobe small infarcts, s/p tPA, infarcts embolic secondary to unknown source concerning for ASD/PFO related. Vascular risk factors include hx of ASD closure at age 49, Hashimoto thyroiditis, HLD and obesity.    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  Vascular risk factors of hyperlipidemia only  PLAN:  I had a long d/w patient and her mother about her recent recurrent cryptogenic strokes, risk for recurrent stroke/TIAs, personally independently reviewed imaging studies and stroke evaluation results and answered questions.Continue Plavix 75 mg daily for secondary stroke prevention and maintain strict control of hypertension with blood pressure goal below 130/90, diabetes with hemoglobin A1c goal below 6.5% and lipids with LDL cholesterol goal below 70 mg/dL. I also advised the patient to eat a healthy diet with plenty of whole grains, cereals, fruits and vegetables, exercise regularly and maintain ideal body weight.  She was advised to consider possible participation in the [place] for stroke prevention and if interested we will screen and see if she qualifies.  She was given a refill of Lipitor upon request and advised to get future refills from primary care physician.  Followup in the future with me in 6 months or call earlier if necessary.  Greater than 50% time during this 30-minute follow-up visit was spent on counseling and coordination of care about her recurrent strokes and discussion for stroke prevention and answering questions.    Delia HeadyPramod Lashonta Pilling, MD  [facility] [address] [place], [place] [id number]  Phone [phone] Fax [phone] Note: This document was prepared with digital dictation and possible smart phrase technology. Any transcriptional errors that result from this process are unintentional.

## 2020-07-01 NOTE — Telephone Encounter (Signed)
Patient called and said that she forgot to ask for a note for work. I confirmed that she is on MyChart and said it would be fine to send her a MyChart message just stating that she was seen here in the office today.

## 2020-07-02 ENCOUNTER — Other Ambulatory Visit: Payer: Self-pay | Admitting: Neurology

## 2020-07-02 ENCOUNTER — Telehealth: Payer: Self-pay | Admitting: Neurology

## 2020-07-02 DIAGNOSIS — Z1159 Encounter for screening for other viral diseases: Secondary | ICD-10-CM | POA: Diagnosis not present

## 2020-07-02 MED ORDER — NURTEC 75 MG PO TBDP: 1.0000 | ORAL_TABLET | ORAL | 1 refills | Status: AC

## 2020-07-02 NOTE — Telephone Encounter (Signed)
error 

## 2020-07-03 ENCOUNTER — Other Ambulatory Visit: Payer: BC Managed Care – PPO

## 2020-07-07 NOTE — Progress Notes (Signed)
Carelink Summary Report / Loop Recorder 

## 2020-07-14 ENCOUNTER — Institutional Professional Consult (permissible substitution): Payer: BC Managed Care – PPO | Admitting: Pulmonary Disease

## 2020-07-17 ENCOUNTER — Inpatient Hospital Stay: Payer: BC Managed Care – PPO | Admitting: Neurology

## 2020-07-18 ENCOUNTER — Ambulatory Visit (INDEPENDENT_AMBULATORY_CARE_PROVIDER_SITE_OTHER): Payer: BC Managed Care – PPO | Admitting: Family Medicine

## 2020-07-18 ENCOUNTER — Encounter: Payer: Self-pay | Admitting: Family Medicine

## 2020-07-18 ENCOUNTER — Other Ambulatory Visit: Payer: Self-pay

## 2020-07-18 VITALS — BP 130/74 | HR 66 | Temp 97.1°F | Ht 62.5 in | Wt 198.1 lb

## 2020-07-18 DIAGNOSIS — Z8673 Personal history of transient ischemic attack (TIA), and cerebral infarction without residual deficits: Secondary | ICD-10-CM | POA: Diagnosis not present

## 2020-07-18 DIAGNOSIS — E039 Hypothyroidism, unspecified: Secondary | ICD-10-CM

## 2020-07-18 DIAGNOSIS — E785 Hyperlipidemia, unspecified: Secondary | ICD-10-CM | POA: Diagnosis not present

## 2020-07-18 MED ORDER — ATORVASTATIN CALCIUM 20 MG PO TABS
20.0000 mg | ORAL_TABLET | Freq: Every day | ORAL | 3 refills | Status: DC
Start: 2020-07-18 — End: 2021-06-16

## 2020-07-18 NOTE — Patient Instructions (Addendum)
Call and schedule your mammogram at the breast center   Stay healthy  Eat a healthy diet  Take care of yourself   Do the ifob kit for colon cancer screening

## 2020-07-18 NOTE — Progress Notes (Signed)
Subjective:    Patient ID: [name], female    DOB: 1970-10-13, 50 y.o.   MRN: [id number]  This visit occurred during the SARS-CoV-2 public health emergency.  Safety protocols were in place, including screening questions prior to the visit, additional usage of staff PPE, and extensive cleaning of exam room while observing appropriate contact time as indicated for disinfecting solutions.    HPI Pt presents for re est primary care   Wt Readings from Last 3 Encounters:  07/18/20 198 lb 2 oz (89.9 kg)  07/01/20 195 lb 12.8 oz (88.8 kg)  06/03/20 198 lb 6.6 oz (90 kg)   35.66 kg/m  Declines flu and covid vaccines   H/o strokes  First one was in sept  Found multiple spots affecting R side of body  (did tpa )  Given plavix , lipitor and asa  Another in nov  This one affected L hand  Big work up  Still have not found out the cause   Has internal loop recorder (watching for a fib) -no far nothing on that  She had open heart surgery 30 y ago  ASD   Most recent imaging  FINDINGS: MRI HEAD FINDINGS  Brain: Small acute infarct in the precentral gyrus of the right frontal lobe (series 5, image 87). Mild associated edema without mass effect. 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 No hydrocephalus. No acute hemorrhage. No mass lesion. No midline shift.  Skull and upper cervical spine: No focal marrow replacing lesion.  Sinuses/Orbits: Small retention cysts in the left maxillary sinus.  MRA HEAD FINDINGS  No evidence of visible hemodynamically significant proximal stenosis or large vessel occlusion in the anterior or posterior circulation. 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 No aneurysm identified.  MRA NECK FINDINGS  No evidence of hemodynamically significant stenosis or occlusion.  IMPRESSION: 1. Small acute infarct in the precentral gyrus of the right frontal lobe. Mild associated edema without  mass effect. 2. No large vessel occlusion or hemodynamically significant proximal stenosis in the head or neck.    BP Readings from Last 3 Encounters:  07/18/20 130/74  07/01/20 118/69  06/06/20 108/66   Pulse Readings from Last 3 Encounters:  07/18/20 66  07/01/20 80  06/06/20 80   Endocrinology - Dr Talmage Nap for hypothyroid  No medicine now  Has [place] scheduled upcoming  Lab Results  Component Value Date   TSH 2.876 06/04/2020    Has thyroid uptake scaning planned   Has appt with hematology upcoming to look for clotting disorder   Pulmonary appt next Monday to look at a lung nodule (seen incidental finding)  Used to smoke   Lost father from glomerular sclerosis   Lab Results  Component Value Date   CHOL 136 06/04/2020   HDL 50 06/04/2020   LDLCALC 64 06/04/2020   TRIG 109 06/04/2020   CHOLHDL 2.7 06/04/2020  takes atorvastatin 20mg  daily   Health mt  Trying to take care of herself  Overdue for mammogram- plans to schedule that  Open to ifob for colon cancer screening   Patient Active Problem List   Diagnosis Date Noted  . History of ischemic left MCA stroke s/p tPA 06/06/2020  . History of repair of congenital atrial septal defect (ASD) 06/06/2020  . Hyperlipidemia LDL goal <70 06/06/2020  . Received intravenous tissue plasminogen activator (tPA) in emergency department   . Hypothyroid 12/22/2011  . Stress incontinence 12/22/2011  . Family  history of kidney disease 12/22/2011  . Routine gynecological examination 12/22/2011  . Routine general medical examination at a health care facility 12/19/2011   Past Medical History:  Diagnosis Date  . Hx of migraines   . Stroke [facility])    06/03/20   Past Surgical History:  Procedure Laterality Date  . BUBBLE STUDY  03/13/2020   Procedure: BUBBLE STUDY;  Surgeon: Quintella Reichert, MD;  Location: [facility] ENDOSCOPY;  Service: Cardiovascular;;  . ENDOMETRIAL ABLATION     Uterine/heavy menses  . IR ANGIO INTRA EXTRACRAN SEL COM  CAROTID INNOMINATE BILAT MOD SED  06/06/2020  . IR ANGIO VERTEBRAL SEL VERTEBRAL BILAT MOD SED  06/06/2020  . IR US GUIDE VASC ACCESS RIGHT  06/06/2020  . SHOULDER SURGERY     Left,/bone spurs  . TEE WITHOUT CARDIOVERSION N/A 03/13/2020   Procedure: TRANSESOPHAGEAL ECHOCARDIOGRAM (TEE);  Surgeon: Quintella Reichert, MD;  Location: [facility] ENDOSCOPY;  Service: Cardiovascular;  Laterality: N/A;   Social History   Tobacco Use  . Smoking status: Former Games developer  . Smokeless tobacco: Never Used  Substance Use Topics  . Alcohol use: Yes    Alcohol/week: 1.0 standard drink    Types: 1 Cans of beer per week    Comment: states 1 beer every other day or so    Family History  Problem Relation Age of Onset  . Hypertension Mother   . Hypothyroidism Mother   . High Cholesterol Mother   . Atrial fibrillation Mother   . COPD Father   . Emphysema Father   . Hypothyroidism Sister    Allergies  Allergen Reactions  . Bee Venom Swelling   Current Outpatient Medications on File Prior to Visit  Medication Sig Dispense Refill  . clopidogrel (PLAVIX) 75 MG tablet Take 1 tablet (75 mg total) by mouth daily. 30 tablet 2   No current facility-administered medications on file prior to visit.    Review of Systems  Constitutional: Negative for activity change, appetite change, fatigue, fever and unexpected weight change.  HENT: Negative for congestion, ear pain, rhinorrhea, sinus pressure and sore throat.   Eyes: Negative for pain, redness and visual disturbance.  Respiratory: Negative for cough, shortness of breath and wheezing.   Cardiovascular: Negative for chest pain and palpitations.  Gastrointestinal: Negative for abdominal pain, blood in stool, constipation and diarrhea.  Endocrine: Negative for polydipsia and polyuria.  Genitourinary: Negative for dysuria, frequency and urgency.  Musculoskeletal: Negative for arthralgias, back pain and myalgias.  Skin: Negative for pallor and rash.  Allergic/Immunologic:  Negative for environmental allergies.  Neurological: Negative for dizziness, tremors, syncope, weakness, light-headedness, numbness and headaches.       No residual stroke symptoms currently   Hematological: Negative for adenopathy. Does not bruise/bleed easily.  Psychiatric/Behavioral: Negative for decreased concentration and dysphoric mood. The patient is not nervous/anxious.        Objective:   Physical Exam Constitutional:      General: She is not in acute distress.    Appearance: Normal appearance. She is well-developed and well-nourished. She is obese. She is not ill-appearing.  HENT:     Head: Normocephalic and atraumatic.     Mouth/Throat:     Mouth: Oropharynx is clear and moist.  Eyes:     Extraocular Movements: EOM normal.     Conjunctiva/sclera: Conjunctivae normal.     Pupils: Pupils are equal, round, and reactive to light.  Neck:     Thyroid: No thyromegaly.     Vascular: No carotid  bruit or JVD.  Cardiovascular:     Rate and Rhythm: Normal rate and regular rhythm.     Pulses: Intact distal pulses.     Heart sounds: Normal heart sounds. No gallop.   Pulmonary:     Effort: Pulmonary effort is normal. No respiratory distress.     Breath sounds: Normal breath sounds. No wheezing or rales.     Comments: No crackles Abdominal:     General: Bowel sounds are normal. There is no distension or abdominal bruit.     Palpations: Abdomen is soft. There is no mass.     Tenderness: There is no abdominal tenderness.  Musculoskeletal:        General: No edema.     Cervical back: Normal range of motion and neck supple.     Right lower leg: No edema.     Left lower leg: No edema.  Lymphadenopathy:     Cervical: No cervical adenopathy.  Skin:    General: Skin is warm and dry.     Coloration: Skin is not pale.     Findings: No rash.  Neurological:     Mental Status: She is alert.     Sensory: No sensory deficit.     Coordination: Coordination normal.     Deep Tendon  Reflexes: Reflexes are normal and symmetric. Reflexes normal.  Psychiatric:        Mood and Affect: Mood and affect and mood normal.           Assessment & Plan:   Problem List Items Addressed This Visit      Cardiovascular and Mediastinum   History of ischemic left MCA stroke s/p tPA - Primary    Pt has had 2 strokes  One in sept affecting R side of body Another in Nov affecting L hand  Had tpa treatment  Taking plavix , asa (initially) and atorvastatin  No embolic source found  Currently has a loop recorder to watch for arrhythmia  Sees neruo Dr Pearlean Brownie Pending hematology visit to discuss possible hypercoag state Pt is aware of s/s of cva and when to call 911      Relevant Medications   atorvastatin (LIPITOR) 20 MG tablet     Endocrine   Hypothyroid    Pt sees Dr Talmage Nap (endocrinology) for this  Lab Results  Component Value Date   TSH 2.876 06/04/2020   No medication currently (prev on thyroid suplementation)        Other   Hyperlipidemia LDL goal <70    Pt has h/o 2 strokes and takes atorvastatin 20mg  daily  Disc goals for lipids and reasons to control them Rev last labs with pt Rev low sat fat diet in detail Good control with LDL under 70   (64) HDL of 50        Relevant Medications   atorvastatin (LIPITOR) 20 MG tablet

## 2020-07-19 NOTE — Assessment & Plan Note (Signed)
Pt has h/o 2 strokes and takes atorvastatin 20mg  daily  Disc goals for lipids and reasons to control them Rev last labs with pt Rev low sat fat diet in detail Good control with LDL under 70   (64) HDL of 50

## 2020-07-19 NOTE — Assessment & Plan Note (Signed)
Pt sees Dr Talmage Nap (endocrinology) for this  Lab Results  Component Value Date   TSH 2.876 06/04/2020   No medication currently (prev on thyroid suplementation)

## 2020-07-19 NOTE — Assessment & Plan Note (Signed)
Pt has had 2 strokes  One in sept affecting R side of body Another in Nov affecting L hand  Had tpa treatment  Taking plavix , asa (initially) and atorvastatin  No embolic source found  Currently has a loop recorder to watch for arrhythmia  Sees neruo Dr Pearlean Brownie Pending hematology visit to discuss possible hypercoag state Pt is aware of s/s of cva and when to call 911

## 2020-07-21 ENCOUNTER — Institutional Professional Consult (permissible substitution): Payer: BC Managed Care – PPO | Admitting: Pulmonary Disease

## 2020-07-24 ENCOUNTER — Ambulatory Visit (INDEPENDENT_AMBULATORY_CARE_PROVIDER_SITE_OTHER): Payer: BC Managed Care – PPO

## 2020-07-24 DIAGNOSIS — Z8673 Personal history of transient ischemic attack (TIA), and cerebral infarction without residual deficits: Secondary | ICD-10-CM

## 2020-07-25 ENCOUNTER — Encounter: Payer: Self-pay | Admitting: Hematology and Oncology

## 2020-07-25 ENCOUNTER — Inpatient Hospital Stay: Payer: BC Managed Care – PPO | Attending: Hematology and Oncology | Admitting: Hematology and Oncology

## 2020-07-25 ENCOUNTER — Other Ambulatory Visit: Payer: Self-pay

## 2020-07-25 ENCOUNTER — Inpatient Hospital Stay: Payer: BC Managed Care – PPO

## 2020-07-25 VITALS — BP 113/68 | HR 71 | Temp 97.0°F | Resp 20 | Ht 62.5 in | Wt 197.6 lb

## 2020-07-25 DIAGNOSIS — Z801 Family history of malignant neoplasm of trachea, bronchus and lung: Secondary | ICD-10-CM | POA: Insufficient documentation

## 2020-07-25 DIAGNOSIS — Z8774 Personal history of (corrected) congenital malformations of heart and circulatory system: Secondary | ICD-10-CM | POA: Insufficient documentation

## 2020-07-25 DIAGNOSIS — Z800 Family history of malignant neoplasm of digestive organs: Secondary | ICD-10-CM | POA: Diagnosis not present

## 2020-07-25 DIAGNOSIS — Z8673 Personal history of transient ischemic attack (TIA), and cerebral infarction without residual deficits: Secondary | ICD-10-CM | POA: Insufficient documentation

## 2020-07-25 DIAGNOSIS — Z9282 Status post administration of tPA (rtPA) in a different facility within the last 24 hours prior to admission to current facility: Secondary | ICD-10-CM | POA: Diagnosis not present

## 2020-07-25 DIAGNOSIS — Z87891 Personal history of nicotine dependence: Secondary | ICD-10-CM | POA: Insufficient documentation

## 2020-07-25 NOTE — Progress Notes (Signed)
[facility] Telephone:([phone]) [phone]   [phone]  INITIAL CONSULT NOTE  Patient Care Team: Tower, Audrie GallusMarne A, MD as PCP - General Quintella Reicherturner, Traci R, MD as PCP - Cardiology (Cardiology)  Hematological/Oncological History # Concern for Hypercoagulable Disorder in Setting of Embolic Strokes of Unclear Etiology 03/11/2020: presented to the emergency department for right arm and leg weakness. Found to have a right parietal occipital region and left frontal lobe small infarcts s/p tPA, infarcts embolic secondary to unknown source concerning for ASD/PFO related as TEE positive for PFO. Treated with TPA.  03/13/2020: hypercoagulable workup performed, found to be negative for lupus anticoagulant, anticardiolipin, anti beta 2 glycoprotein antibodies. 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 Treated with TPA.  07/25/2020: establish care with Dr. Leonides Schanzorsey   CHIEF COMPLAINTS/PURPOSE OF CONSULTATION:  "Embolic Strokes of Unclear Etiology "  HISTORY OF PRESENTING ILLNESS:  [name] 50 y.o. female with medical history significant for a history of migraines and recent embolic strokes of unclear etiology who presents for consideration of a hypercoagulation workup.   On review of the previous records [name] has had 2 strokes at the end of last year.  The first was on 03/11/2020 at which time she presented the emergency department for right arm leg and weakness.  She was found to have a right parietal occipital region and left frontal lobe small infarcts and was started on tPA therapy.  There was some concern based on TEE for a PFO.  Subsequently she underwent a hypercoagulation work-up later that month and was found to be negative for lupus anticoagulant, anticardiolipin, and antibeta-2 glycoprotein.  There was no elevation of homocystine.  Subsequently on 05/17/2020 the patient had a recurrent right frontal MCA branch  infarct also of cryptogenic etiology.  She was again treated with tPA.  Fortunately she has had no residual deficits as result of these clots.  She was referred to hematology for further evaluation and management on concern for hypercoagulable state.  On exam today [name] orts that she has no prior personal or family history of blood clots prior to these strokes.  She reports that she was an otherwise healthy person until all of this came about.  She knows that there is a family history of rheumatological diseases with her sister currently being worked up for lupus, MS, and rheumatoid arthritis.  She notes that there was some lung cancer in her maternal grandfather as well as stomach cancer in her paternal grandmother.  She reports that she was previously a smoker but quit approximately 15 to 16 years ago and she smoked for about 15 years at half pack per day.  She is not currently on estrogen-based birth control but was on Depo shots at one point in time.  She reports that she has not been on any birth control since 2007.  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  On further discussion she has not had any recent COVID vaccines (as a matter fact she has not had any COVID vaccines) and has not had any other changes in her health or new medications.  Her mammogram is currently up to date and she is being scheduled for a fecal occult blood card stool test.  She currently denies any fevers, chills, sweats, nausea, vomiting or diarrhea.  A full 10 point ROS is listed below. MEDICAL HISTORY:  Past Medical History:  Diagnosis Date  . Hx of migraines   .  Stroke [facility])    06/03/20    SURGICAL HISTORY: Past Surgical History:  Procedure Laterality Date  . BUBBLE STUDY  03/13/2020   Procedure: BUBBLE STUDY;  Surgeon: Quintella Reichert, MD;  Location: [facility] ENDOSCOPY;  Service: Cardiovascular;;  . ENDOMETRIAL ABLATION     Uterine/heavy menses  . IR ANGIO INTRA EXTRACRAN SEL COM CAROTID  INNOMINATE BILAT MOD SED  06/06/2020  . IR ANGIO VERTEBRAL SEL VERTEBRAL BILAT MOD SED  06/06/2020  . IR US GUIDE VASC ACCESS RIGHT  06/06/2020  . SHOULDER SURGERY     Left,/bone spurs  . TEE WITHOUT CARDIOVERSION N/A 03/13/2020   Procedure: TRANSESOPHAGEAL ECHOCARDIOGRAM (TEE);  Surgeon: Quintella Reichert, MD;  Location: [facility] ENDOSCOPY;  Service: Cardiovascular;  Laterality: N/A;    SOCIAL HISTORY: Social History   Socioeconomic History  . Marital status: Married    Spouse name: Not on file  . Number of children: Not on file  . Years of education: Not on file  . Highest education level: Not on file  Occupational History  . Occupation: full time  Tobacco Use  . Smoking status: Former Games developer  . Smokeless tobacco: Never Used  Substance and Sexual Activity  . Alcohol use: Yes    Alcohol/week: 1.0 standard drink    Types: 1 Cans of beer per week    Comment: states 1 beer every other day or so   . Drug use: Not on file  . Sexual activity: Not on file  Other Topics Concern  . Not on file  Social History Narrative   Last Mamm: 04.07.10 @ [facility] BI-RADS Category 1: Negative^MM DIGITAL DG BILATERAL   Lives with husband and 2 daughters   Left Handed   Drinks no caffeine   Social Determinants of Health   Financial Resource Strain: Not on file  Food Insecurity: Not on file  Transportation Needs: No Transportation Needs  . Lack of Transportation (Medical): No  . Lack of Transportation (Non-Medical): No  Physical Activity: Not on file  Stress: Not on file  Social Connections: Not on file  Intimate Partner Violence: Not on file    FAMILY HISTORY: Family History  Problem Relation Age of Onset  . Hypertension Mother   . Hypothyroidism Mother   . High Cholesterol Mother   . Atrial fibrillation Mother   . COPD Father   . Emphysema Father   . Hypothyroidism Sister     ALLERGIES:  is allergic to bee venom.  MEDICATIONS:  Current Outpatient Medications  Medication Sig Dispense Refill   . atorvastatin (LIPITOR) 20 MG tablet Take 1 tablet (20 mg total) by mouth daily. 90 tablet 3  . clopidogrel (PLAVIX) 75 MG tablet Take 1 tablet (75 mg total) by mouth daily. 30 tablet 2   No current facility-administered medications for this visit.    REVIEW OF SYSTEMS:   Constitutional: ( - ) fevers, ( - )  chills , ( - ) night sweats Eyes: ( - ) blurriness of vision, ( - ) double vision, ( - ) watery eyes Ears, nose, mouth, throat, and face: ( - ) mucositis, ( - ) sore throat Respiratory: ( - ) cough, ( - ) dyspnea, ( - ) wheezes Cardiovascular: ( - ) palpitation, ( - ) chest discomfort, ( - ) lower extremity swelling Gastrointestinal:  ( - ) nausea, ( - ) heartburn, ( - ) change in bowel habits Skin: ( - ) abnormal skin rashes Lymphatics: ( - ) new lymphadenopathy, ( - ) easy  bruising Neurological: ( - ) numbness, ( - ) tingling, ( - ) new weaknesses Behavioral/Psych: ( - ) mood change, ( - ) new changes  All other systems were reviewed with the patient and are negative.  PHYSICAL EXAMINATION:  Vitals:   07/25/20 1412  BP: 113/68  Pulse: 71  Resp: 20  Temp: (!) 97 F (36.1 C)  SpO2: 100%   Filed Weights   07/25/20 1412  Weight: 197 lb 9.6 oz (89.6 kg)    GENERAL: well appearing middle aged Caucasian female in NAD  SKIN: skin color, texture, turgor are normal, no rashes or significant lesions EYES: conjunctiva are pink and non-injected, sclera clear LUNGS: clear to auscultation and percussion with normal breathing effort HEART: regular rate & rhythm and no murmurs and no lower extremity edema Musculoskeletal: no cyanosis of digits and no clubbing  PSYCH: alert & oriented x 3, fluent speech NEURO: no focal motor/sensory deficits  LABORATORY DATA:  I have reviewed the data as listed CBC Latest Ref Rng & Units 06/03/2020 06/03/2020 03/14/2020  WBC 4.0 - 10.5 K/uL - 7.2 10.2  Hemoglobin 12.0 - 15.0 g/dL 37.8 58.8 50.2  Hematocrit 36.0 - 46.0 % 40.0 40.1 37.3   Platelets 150 - 400 K/uL - 277 219    CMP Latest Ref Rng & Units 06/03/2020 06/03/2020 05/12/2020  Glucose 70 - 99 mg/dL 774(J) 287(O) 676(H)  BUN 6 - 20 mg/dL 6 7 10   Creatinine 0.44 - 1.00 mg/dL 2.09 4.70  Sodium 135 - 145 mmol/L 141 139 138  Potassium 3.5 - 5.1 mmol/L 3.5 3.5 3.9  Chloride 98 - 111 mmol/L 103 104 102  CO2 22 - 32 mmol/L - 23 24  Calcium 8.9 - 10.3 mg/dL - 9.1 9.2  Total Protein 6.5 - 8.1 g/dL - 6.8 -  Total Bilirubin 0.3 - 1.2 mg/dL - 0.9 -  Alkaline Phos 38 - 126 U/L - 90 -  AST 15 - 41 U/L - 20 -  ALT 0 - 44 U/L - 23 -    RADIOGRAPHIC STUDIES: CUP PACEART REMOTE DEVICE CHECK  Result Date: 07/27/2020 ILR summary report received. Battery status OK. Normal device function. No new symptom, tachy, brady, or pause episodes. No new AF episodes. Monthly summary reports and ROV/PRN   ASSESSMENT & PLAN Sheila L Hebner 50 y.o. female with medical history significant for a history of migraines and recent embolic strokes of unclear etiology who presents for consideration of a hypercoagulation workup.   # Concern for Hypercoagulable Disorder in Setting of Embolic Strokes of Unclear Etiology -- Prior antiphospholipid antibody syndrome work-up with beta-2 glycoprotein, cardiolipin, and lupus anticoagulant panel were all negative.  Patient also had a normal homocystine. -- Today will order factor V Leiden, protein S, protein C, Antithrombin III, and prothrombin gene mutation.  I would stick with these focused labs to help determine the etiology of the patient's strokes. -- Encouraged continued work-up with neurology for another etiology of the strokes.  Hypercoagulable states rarely cause stroke such as these without other history of thrombosis or family history of clotting disorder. -- Continue Plavix therapy as recommended by patient's neurologist. -- Return to clinic if there are concerning findings on the patient's hypercoagulable work-up. -- Due to our courier being  out of the office today we will have to have the patient's labs collected and sent off for analysis next week.  No orders of the defined types were placed in this encounter.   All questions were answered. The  patient knows to call the clinic with any problems, questions or concerns.  A total of more than 60 minutes were spent on this encounter and over half of that time was spent on counseling and coordination of care as outlined above.   Ulysees Barns, MD Department of Hematology/Oncology [facility] Phone: [phone] Pager: [phone] Email: [email or web address]  07/27/2020 4:39 PM

## 2020-07-27 ENCOUNTER — Encounter: Payer: Self-pay | Admitting: Hematology and Oncology

## 2020-07-27 LAB — CUP PACEART REMOTE DEVICE CHECK
Date Time Interrogation Session: 20220122084449
Implantable Pulse Generator Implant Date: 20211116

## 2020-07-29 ENCOUNTER — Other Ambulatory Visit: Payer: Self-pay

## 2020-07-29 ENCOUNTER — Encounter (HOSPITAL_COMMUNITY)
Admission: RE | Admit: 2020-07-29 | Discharge: 2020-07-29 | Disposition: A | Discharge status: Home / Self Care | Payer: BC Managed Care – PPO | Source: Ambulatory Visit | Attending: Endocrinology | Admitting: Endocrinology

## 2020-07-29 DIAGNOSIS — E049 Nontoxic goiter, unspecified: Secondary | ICD-10-CM | POA: Insufficient documentation

## 2020-07-29 IMAGING — NM NM THYROID IMAGING W/ UPTAKE MULTI (4&24 HR)
4 series · 4 of 4 positions shown · non-contrast
Comparison: None

CLINICAL DATA: Nontoxic goiter

EXAM:
THYROID SCAN AND UPTAKE - 4 AND 24 HOURS
TECHNIQUE: Following oral administration of [FK] capsule, anterior planar
imaging was acquired at 24 hours. Thyroid uptake was calculated with
a thyroid probe at 4-6 hours and 24 hours.
RADIOPHARMACEUTICALS:  486 uCi [FK] sodium iodide p.o.

[iu thyroid uptake i123 · 3.10mm/px · 1 of 1 slices shown (1 of 4)]
[im 1/1]
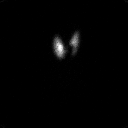

[iu thyroid uptake i123 · 3.10mm/px · 1 of 1 slices shown (2 of 4)]
[im 1/1]
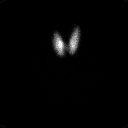

[iu thyroid uptake i123 · 3.10mm/px · 1 of 1 slices shown (3 of 4)]
[im 1/1]
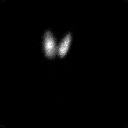

[iu thyroid uptake i123 · 3.10mm/px · 1 of 1 slices shown (4 of 4)]
[im 1/1]
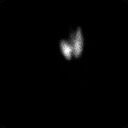

[4 of 4 positions shown; findings below may reference images not displayed]

FINDINGS: Homogeneous tracer distribution throughout both thyroid lobes.

Faint pyramidal lobe noted.

No focal areas of increased or decreased tracer uptake identified.

4 hour [FK] uptake = 20%% (normal 5-20%)

24 hour [FK] uptake = 38%% (normal 10-30%)
IMPRESSION: Mildly elevated 24 hour radio iodine uptake consistent with
hyperthyroidism.

Normal thyroid scan.

Uptake and scintigraphy are suggestive of Graves disease, though TSH
is not suppressed.

## 2020-07-29 MED ORDER — SODIUM IODIDE I-123 7.4 MBQ CAPS
486.0000 | ORAL_CAPSULE | Freq: Once | ORAL | Status: AC
  Administered 2020-07-29: 486 via ORAL

## 2020-07-30 ENCOUNTER — Encounter (HOSPITAL_COMMUNITY)
Admission: RE | Admit: 2020-07-30 | Discharge: 2020-07-30 | Disposition: A | Discharge status: Home / Self Care | Payer: BC Managed Care – PPO | Source: Ambulatory Visit | Attending: Endocrinology | Admitting: Endocrinology

## 2020-07-30 ENCOUNTER — Other Ambulatory Visit: Payer: Self-pay

## 2020-07-30 DIAGNOSIS — E049 Nontoxic goiter, unspecified: Secondary | ICD-10-CM | POA: Diagnosis not present

## 2020-07-31 ENCOUNTER — Telehealth: Payer: Self-pay | Admitting: Hematology and Oncology

## 2020-07-31 NOTE — Telephone Encounter (Signed)
Per 1/21 los, no changes made to pt schedule  

## 2020-08-01 ENCOUNTER — Inpatient Hospital Stay: Payer: BC Managed Care – PPO

## 2020-08-01 ENCOUNTER — Other Ambulatory Visit: Payer: Self-pay

## 2020-08-01 ENCOUNTER — Institutional Professional Consult (permissible substitution): Payer: BC Managed Care – PPO | Admitting: Emergency Medicine

## 2020-08-01 DIAGNOSIS — Z8774 Personal history of (corrected) congenital malformations of heart and circulatory system: Secondary | ICD-10-CM | POA: Diagnosis not present

## 2020-08-01 DIAGNOSIS — Z801 Family history of malignant neoplasm of trachea, bronchus and lung: Secondary | ICD-10-CM | POA: Diagnosis not present

## 2020-08-01 DIAGNOSIS — Z9282 Status post administration of tPA (rtPA) in a different facility within the last 24 hours prior to admission to current facility: Secondary | ICD-10-CM | POA: Diagnosis not present

## 2020-08-01 DIAGNOSIS — Z8673 Personal history of transient ischemic attack (TIA), and cerebral infarction without residual deficits: Secondary | ICD-10-CM

## 2020-08-01 DIAGNOSIS — Z800 Family history of malignant neoplasm of digestive organs: Secondary | ICD-10-CM | POA: Diagnosis not present

## 2020-08-01 DIAGNOSIS — Z87891 Personal history of nicotine dependence: Secondary | ICD-10-CM | POA: Diagnosis not present

## 2020-08-01 LAB — CBC WITH DIFFERENTIAL (CANCER CENTER ONLY)
Abs Immature Granulocytes: 0.02 10*3/uL (ref 0.00–0.07)
Basophils Absolute: 0.1 10*3/uL (ref 0.0–0.1)
Basophils Relative: 1 %
Eosinophils Absolute: 0.3 10*3/uL (ref 0.0–0.5)
Eosinophils Relative: 5 %
HCT: 37.7 % (ref 36.0–46.0)
Hemoglobin: 12.9 g/dL (ref 12.0–15.0)
Immature Granulocytes: 0 %
Lymphocytes Relative: 32 %
Lymphs Abs: 2 10*3/uL (ref 0.7–4.0)
MCH: 31.9 pg (ref 26.0–34.0)
MCHC: 34.2 g/dL (ref 30.0–36.0)
MCV: 93.1 fL (ref 80.0–100.0)
Monocytes Absolute: 0.5 10*3/uL (ref 0.1–1.0)
Monocytes Relative: 8 %
Neutro Abs: 3.4 10*3/uL (ref 1.7–7.7)
Neutrophils Relative %: 54 %
Platelet Count: 282 10*3/uL (ref 150–400)
RBC: 4.05 MIL/uL (ref 3.87–5.11)
RDW: 12.6 % (ref 11.5–15.5)
WBC Count: 6.3 10*3/uL (ref 4.0–10.5)
nRBC: 0 % (ref 0.0–0.2)

## 2020-08-01 LAB — CMP (CANCER CENTER ONLY)
ALT: 19 U/L (ref 0–44)
AST: 14 U/L — ABNORMAL LOW (ref 15–41)
Albumin: 4.2 g/dL (ref 3.5–5.0)
Alkaline Phosphatase: 102 U/L (ref 38–126)
Anion gap: 10 (ref 5–15)
BUN: 8 mg/dL (ref 6–20)
CO2: 25 mmol/L (ref 22–32)
Calcium: 9.3 mg/dL (ref 8.9–10.3)
Chloride: 106 mmol/L (ref 98–111)
Creatinine: 0.75 mg/dL (ref 0.44–1.00)
GFR, Estimated: >60 mL/min (ref >60)
Glucose, Bld: 90 mg/dL (ref 70–99)
Potassium: 3.6 mmol/L (ref 3.5–5.1)
Sodium: 141 mmol/L (ref 135–145)
Total Bilirubin: 0.5 mg/dL (ref 0.3–1.2)
Total Protein: 8.1 g/dL (ref 6.5–8.1)

## 2020-08-01 LAB — ANTITHROMBIN III: AntiThromb III Func: 105 % (ref 75–120)

## 2020-08-02 LAB — PROTEIN S PANEL
Protein S Activity: 89 % (ref 63–140)
Protein S Ag, Free: 107 % (ref 61–136)
Protein S Ag, Total: 89 % (ref 60–150)

## 2020-08-04 LAB — PROTEIN C, TOTAL: Protein C, Total: 100 % (ref 60–150)

## 2020-08-06 LAB — FACTOR 5 LEIDEN

## 2020-08-06 NOTE — Progress Notes (Signed)
Carelink Summary Report / Loop Recorder 

## 2020-08-07 ENCOUNTER — Other Ambulatory Visit: Payer: Self-pay

## 2020-08-07 ENCOUNTER — Encounter: Payer: Self-pay | Admitting: Pulmonary Disease

## 2020-08-07 ENCOUNTER — Ambulatory Visit (INDEPENDENT_AMBULATORY_CARE_PROVIDER_SITE_OTHER): Payer: BC Managed Care – PPO | Admitting: Pulmonary Disease

## 2020-08-07 VITALS — BP 116/80 | HR 78 | Temp 98.8°F | Ht 64.0 in | Wt 195.0 lb

## 2020-08-07 DIAGNOSIS — Z8774 Personal history of (corrected) congenital malformations of heart and circulatory system: Secondary | ICD-10-CM | POA: Diagnosis not present

## 2020-08-07 DIAGNOSIS — Z87891 Personal history of nicotine dependence: Secondary | ICD-10-CM | POA: Diagnosis not present

## 2020-08-07 DIAGNOSIS — Z8673 Personal history of transient ischemic attack (TIA), and cerebral infarction without residual deficits: Secondary | ICD-10-CM | POA: Diagnosis not present

## 2020-08-07 DIAGNOSIS — R911 Solitary pulmonary nodule: Secondary | ICD-10-CM

## 2020-08-07 LAB — PROTHROMBIN GENE MUTATION

## 2020-08-07 NOTE — Patient Instructions (Addendum)
Thank you for visiting Dr. Tonia Brooms at [facility]. Today we recommend the following:  Orders Placed This Encounter  Procedures  . CT Chest Wo Contrast   I will call you with the CT results.   Return in about 1 year (around 08/07/2021) for with APP or Dr. Tonia Brooms.    Please do your part to reduce the spread of COVID-19.

## 2020-08-07 NOTE — Progress Notes (Signed)
Synopsis: Referred in February 2022 for lung nodule by Judy Pimple, MD  Subjective:   PATIENT ID: Vic Ripper GENDER: female DOB: [birth year], MRN: [id number]  Chief Complaint  Patient presents with  . Consult    Lung nodules discovered when ct was done of heart in 04/2020    This is a 50 year old female, past medical history of stroke.  Was admitted to the hospital for evaluation of cryptogenic stroke has had hypercoagulable work-up has been seen by inpatient outpatient neurology as well as Dr. Leonides Schanz from oncology.  All of her work-up at this point has been negative.  Currently on stroke prevention with Plavix.  Had seen Dr. Excell Seltzer in the past for coronary CT scan.  This coronary CT scan was completed in November 2021.  It revealed a 4 mm right middle lobe lung nodule.  She is here today for follow-up regarding this she has not had any axial CT imaging of the chest since then.  From respiratory standpoint she is stable with no complaints of cough, shortness of breath or hemoptysis.  She denies fevers chills night sweats.   Past Medical History:  Diagnosis Date  . Hx of migraines   . Stroke [facility])    06/03/20     Family History  Problem Relation Age of Onset  . Hypertension Mother   . Hypothyroidism Mother   . High Cholesterol Mother   . Atrial fibrillation Mother   . COPD Father   . Emphysema Father   . Hypothyroidism Sister      Past Surgical History:  Procedure Laterality Date  . BUBBLE STUDY  03/13/2020   Procedure: BUBBLE STUDY;  Surgeon: Quintella Reichert, MD;  Location: [facility] ENDOSCOPY;  Service: Cardiovascular;;  . ENDOMETRIAL ABLATION     Uterine/heavy menses  . IR ANGIO INTRA EXTRACRAN SEL COM CAROTID INNOMINATE BILAT MOD SED  06/06/2020  . IR ANGIO VERTEBRAL SEL VERTEBRAL BILAT MOD SED  06/06/2020  . IR US GUIDE VASC ACCESS RIGHT  06/06/2020  . SHOULDER SURGERY     Left,/bone spurs  . TEE WITHOUT CARDIOVERSION N/A 03/13/2020   Procedure: TRANSESOPHAGEAL  ECHOCARDIOGRAM (TEE);  Surgeon: Quintella Reichert, MD;  Location: [facility] ENDOSCOPY;  Service: Cardiovascular;  Laterality: N/A;    Social History   Socioeconomic History  . Marital status: Married    Spouse name: Not on file  . Number of children: Not on file  . Years of education: Not on file  . Highest education level: Not on file  Occupational History  . Occupation: full time  Tobacco Use  . Smoking status: Former Games developer  . Smokeless tobacco: Never Used  Substance and Sexual Activity  . Alcohol use: Yes    Alcohol/week: 1.0 standard drink    Types: 1 Cans of beer per week    Comment: states 1 beer every other day or so   . Drug use: Not on file  . Sexual activity: Not on file  Other Topics Concern  . Not on file  Social History Narrative   Last Mamm: 04.07.10 @ [facility] BI-RADS Category 1: Negative^MM DIGITAL DG BILATERAL   Lives with husband and 2 daughters   Left Handed   Drinks no caffeine   Social Determinants of Health   Financial Resource Strain: Not on file  Food Insecurity: Not on file  Transportation Needs: No Transportation Needs  . Lack of Transportation (Medical): No  . Lack of Transportation (Non-Medical): No  Physical Activity: Not on file  Stress:  Not on file  Social Connections: Not on file  Intimate Partner Violence: Not on file     Allergies  Allergen Reactions  . Bee Venom Swelling     Outpatient Medications Prior to Visit  Medication Sig Dispense Refill  . atorvastatin (LIPITOR) 20 MG tablet Take 1 tablet (20 mg total) by mouth daily. 90 tablet 3  . clopidogrel (PLAVIX) 75 MG tablet Take 1 tablet (75 mg total) by mouth daily. 30 tablet 2   No facility-administered medications prior to visit.    Review of Systems  Constitutional: Negative for chills, fever, malaise/fatigue and weight loss.  HENT: Negative for hearing loss, sore throat and tinnitus.   Eyes: Negative for blurred vision and double vision.  Respiratory: Negative for cough,  hemoptysis, sputum production, shortness of breath, wheezing and stridor.   Cardiovascular: Negative for chest pain, palpitations, orthopnea, leg swelling and PND.  Gastrointestinal: Negative for abdominal pain, constipation, diarrhea, heartburn, nausea and vomiting.  Genitourinary: Negative for dysuria, hematuria and urgency.  Musculoskeletal: Negative for joint pain and myalgias.  Skin: Negative for itching and rash.  Neurological: Negative for dizziness, tingling, weakness and headaches.  Endo/Heme/Allergies: Negative for environmental allergies. Does not bruise/bleed easily.  Psychiatric/Behavioral: Negative for depression. The patient is not nervous/anxious and does not have insomnia.   All other systems reviewed and are negative.    Objective:  Physical Exam Vitals reviewed.  Constitutional:      General: She is not in acute distress.    Appearance: She is well-developed and well-nourished.  HENT:     Head: Normocephalic and atraumatic.     Mouth/Throat:     Mouth: Oropharynx is clear and moist.  Eyes:     General: No scleral icterus.    Conjunctiva/sclera: Conjunctivae normal.     Pupils: Pupils are equal, round, and reactive to light.  Neck:     Vascular: No JVD.     Trachea: No tracheal deviation.  Cardiovascular:     Rate and Rhythm: Normal rate and regular rhythm.     Pulses: Intact distal pulses.     Heart sounds: Normal heart sounds. No murmur heard.   Pulmonary:     Effort: Pulmonary effort is normal. No tachypnea, accessory muscle usage or respiratory distress.     Breath sounds: Normal breath sounds. No stridor. No wheezing, rhonchi or rales.  Abdominal:     General: Bowel sounds are normal. There is no distension.     Palpations: Abdomen is soft.     Tenderness: There is no abdominal tenderness.  Musculoskeletal:        General: No tenderness or edema.     Cervical back: Neck supple.  Lymphadenopathy:     Cervical: No cervical adenopathy.  Skin:     General: Skin is warm and dry.     Capillary Refill: Capillary refill takes less than 2 seconds.     Findings: No rash.  Neurological:     Mental Status: She is alert and oriented to person, place, and time.  Psychiatric:        Mood and Affect: Mood and affect normal.        Behavior: Behavior normal.      Vitals:   08/07/20 1604  BP: 116/80  Pulse: 78  Temp: 98.8 F (37.1 C)  SpO2: 98%  Weight: 195 lb (88.5 kg)  Height: 5\' 4"  (1.626 m)   98% on RA BMI Readings from Last 3 Encounters:  08/07/20 33.47 kg/m  07/25/20 35.57  kg/m  07/18/20 35.66 kg/m   Wt Readings from Last 3 Encounters:  08/07/20 195 lb (88.5 kg)  07/25/20 197 lb 9.6 oz (89.6 kg)  07/18/20 198 lb 2 oz (89.9 kg)     CBC    Component Value Date/Time   WBC 6.3 08/01/2020 1405   WBC 7.2 06/03/2020 1446   RBC 4.05 08/01/2020 1405   HGB 12.9 08/01/2020 1405   HCT 37.7 08/01/2020 1405   PLT 282 08/01/2020 1405   MCV 93.1 08/01/2020 1405   MCH 31.9 08/01/2020 1405   MCHC 34.2 08/01/2020 1405   RDW 12.6 08/01/2020 1405   LYMPHSABS 2.0 08/01/2020 1405   MONOABS 0.5 08/01/2020 1405   EOSABS 0.3 08/01/2020 1405   BASOSABS 0.1 08/01/2020 1405    Chest Imaging: Coronary CT November 2021: 4 mm right middle lobe lung nodule. The patient's images have been independently reviewed by me.    Pulmonary Functions Testing Results: No flowsheet data found.  FeNO:   Pathology:   Echocardiogram:   Heart Catheterization:     Assessment & Plan:     ICD-10-CM   1. Lung nodule  R91.1 CT Chest Wo Contrast  2. History of ischemic left MCA stroke s/p tPA  Z86.73   3. History of repair of congenital atrial septal defect (ASD)  Z87.74   4. Former smoker  Z87.891     Discussion:  This is a 50 year old female, right middle lobe lung nodule 4 mm in size found incidentally.  History of smoking quit several years ago.  Overall we have not had axial CT imaging of the chest for complete evaluation.  Also has a  history of cryptogenic stroke.  Plan: We will obtain a full CT scan of the chest for evaluation. If nodule stable or still present we will plan for repeat CT scan of the chest in 1 year. We reviewed CT imaging today in the office and discussed the probability of malignancy regarding small lung nodules.  We also reviewed solitary pulmonary nodule risk calculator online.   Current Outpatient Medications:  .  atorvastatin (LIPITOR) 20 MG tablet, Take 1 tablet (20 mg total) by mouth daily., Disp: 90 tablet, Rfl: 3 .  clopidogrel (PLAVIX) 75 MG tablet, Take 1 tablet (75 mg total) by mouth daily., Disp: 30 tablet, Rfl: 2   Josephine Igo, DO [facility] 08/07/2020 4:09 PM

## 2020-08-08 ENCOUNTER — Telehealth: Payer: Self-pay | Admitting: Pulmonary Disease

## 2020-08-08 NOTE — Telephone Encounter (Signed)
Left message for patient to call back  

## 2020-08-08 NOTE — Telephone Encounter (Signed)
Patient is returning phone call. Patient phone number is [phone] before 2:00 pm and [phone] after 2:00 pm.

## 2020-08-08 NOTE — Telephone Encounter (Signed)
Patient is returning phone call. Patient phone number is [phone] c.

## 2020-08-08 NOTE — Telephone Encounter (Signed)
Yes ok for work note  Josephine Igo, DO [facility] 08/08/2020 3:12 PM

## 2020-08-08 NOTE — Telephone Encounter (Signed)
Called and spoke with pt. Pt states she needs a note for work stating that she was seen for an OV yesterday 2/3. Dr. Tonia Brooms, please advise if you are okay with [place] writing this.

## 2020-08-08 NOTE — Telephone Encounter (Signed)
Attempted to call pt but unable to reach. Left message for her to return call. 

## 2020-08-08 NOTE — Telephone Encounter (Signed)
Called and spoke with patient to let her know that Dr. Tonia Brooms was ok with [place] writing her a note that she was seen yesterday. Asked patient if she wanted to come and pick it up, mail it or get it through [employer] [facility]. She stated she would like to get it through [employer] and print it off. Letter has been done and sent. Nothing further needed at this time.

## 2020-08-15 ENCOUNTER — Telehealth: Payer: Self-pay | Admitting: *Deleted

## 2020-08-15 ENCOUNTER — Ambulatory Visit (INDEPENDENT_AMBULATORY_CARE_PROVIDER_SITE_OTHER)
Admission: RE | Admit: 2020-08-15 | Discharge: 2020-08-15 | Disposition: A | Discharge status: Home / Self Care | Payer: BC Managed Care – PPO | Source: Ambulatory Visit | Attending: Pulmonary Disease | Admitting: Pulmonary Disease

## 2020-08-15 ENCOUNTER — Other Ambulatory Visit: Payer: Self-pay

## 2020-08-15 DIAGNOSIS — I517 Cardiomegaly: Secondary | ICD-10-CM | POA: Diagnosis not present

## 2020-08-15 DIAGNOSIS — R911 Solitary pulmonary nodule: Secondary | ICD-10-CM | POA: Diagnosis not present

## 2020-08-15 IMAGING — CT CT CHEST W/O CM
2 of 4 series · 15 of 36 positions shown, 18 images · non-contrast
Comparison: CT dated [DATE].

CLINICAL DATA: Lung nodule follow-up.

EXAM:
CT CHEST WITHOUT CONTRAST
TECHNIQUE: Multidetector CT imaging of the chest was performed following the
standard protocol without IV contrast.

[Series 2: thorax · axial · 0.65mm/px · z∈[-278,-40]mm · 12 of 141 slices shown, 15 images]
[im 11/141  mediastinal]
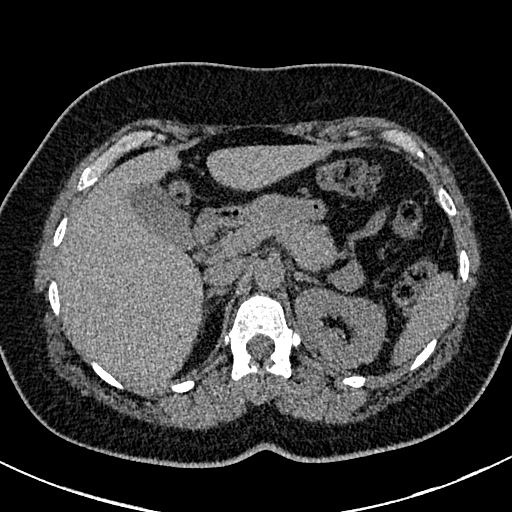
[im 11/141  lung]
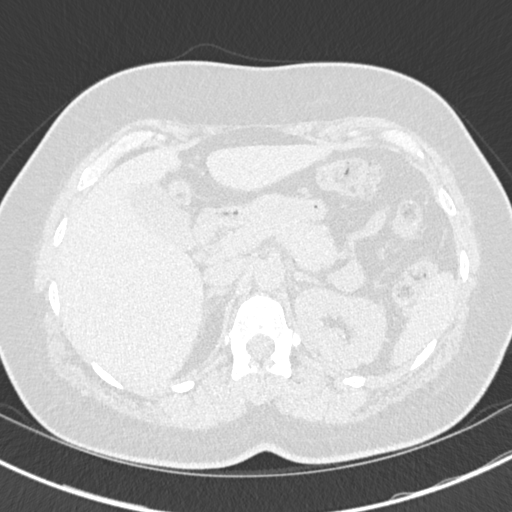
[im 22/141  lung]
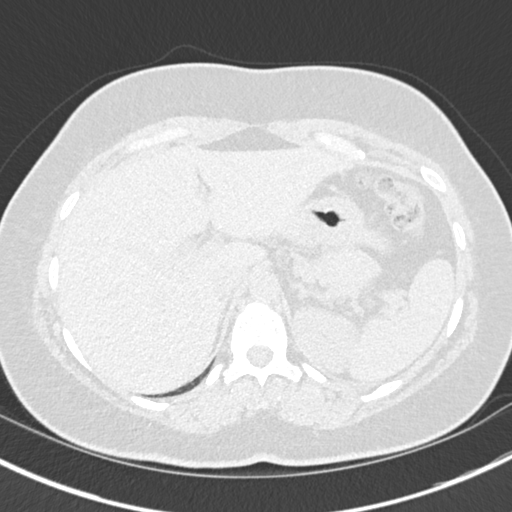
[im 33/141  lung]
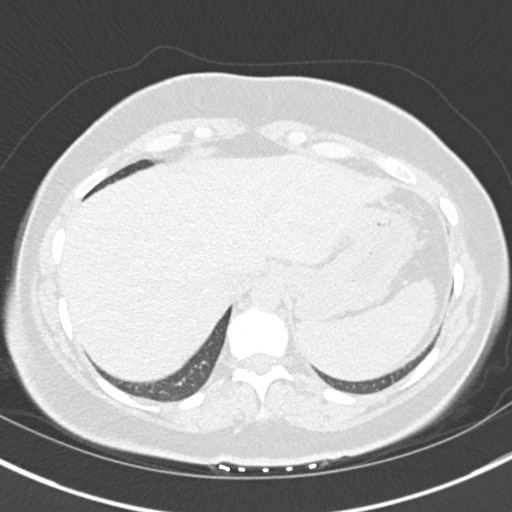
[im 44/141  lung]
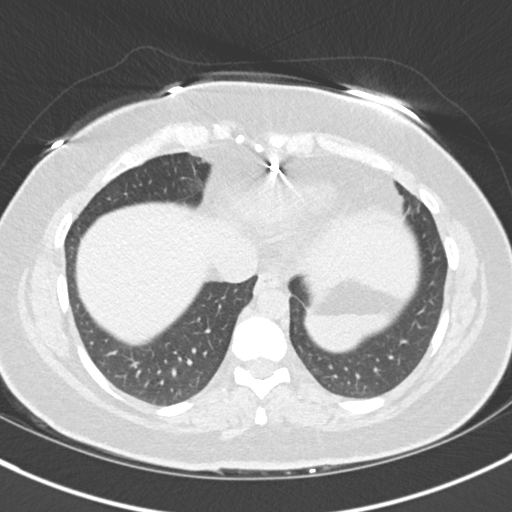
[im 54/141  mediastinal]
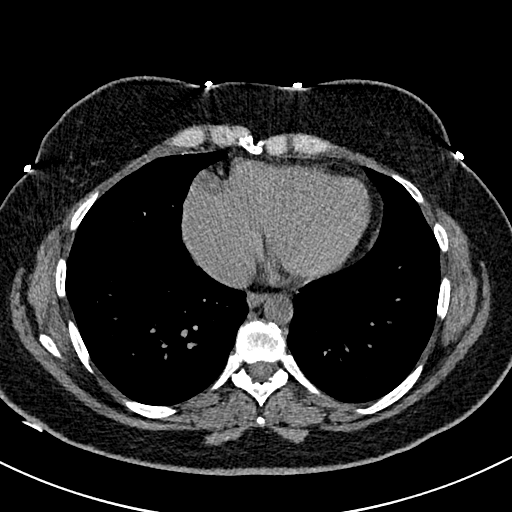
[im 54/141  lung]
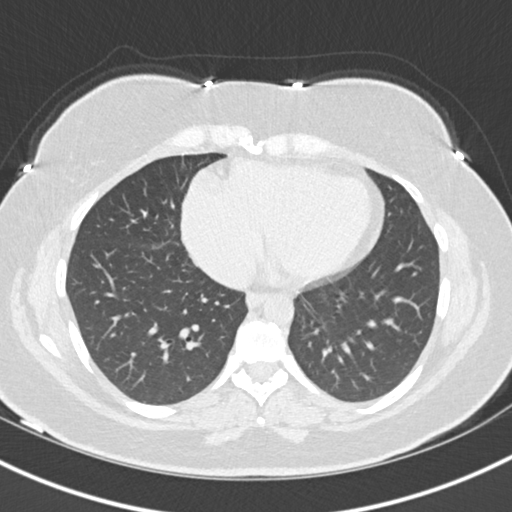
[im 65/141  lung]
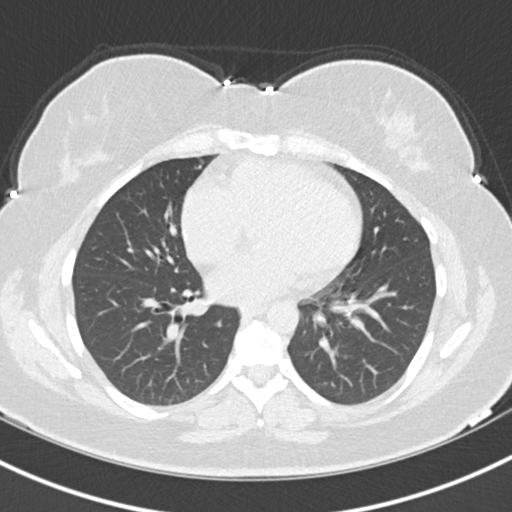
[im 76/141  lung]
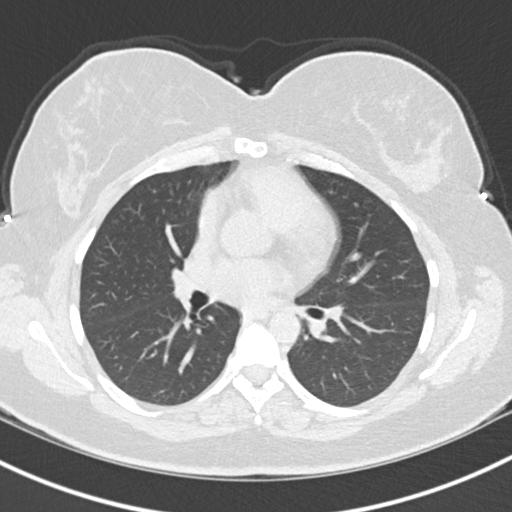
[im 87/141  lung]
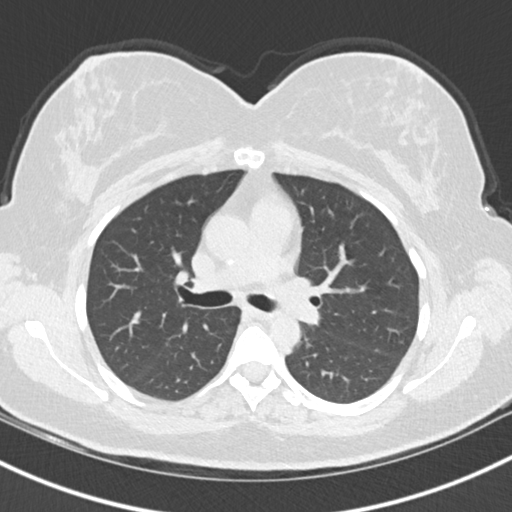
[im 97/141  mediastinal]
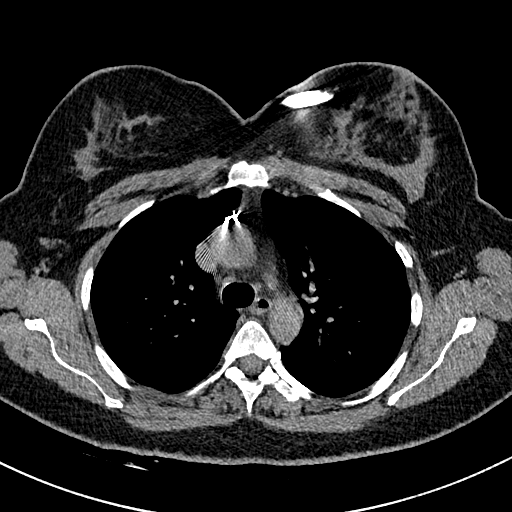
[im 97/141  lung]
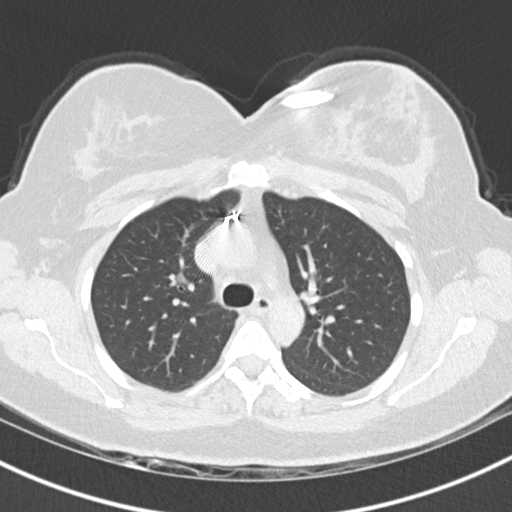
[im 108/141  lung]
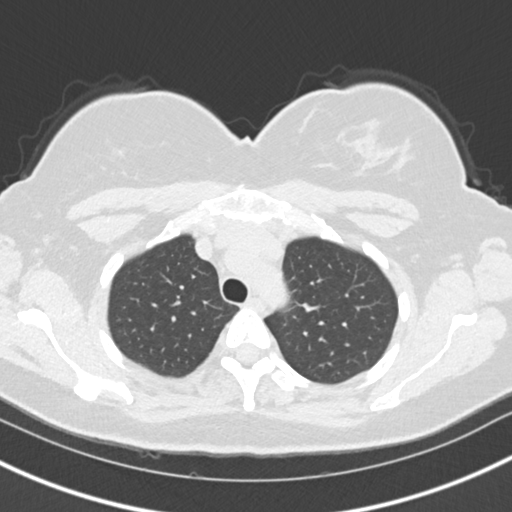
[im 119/141  lung]
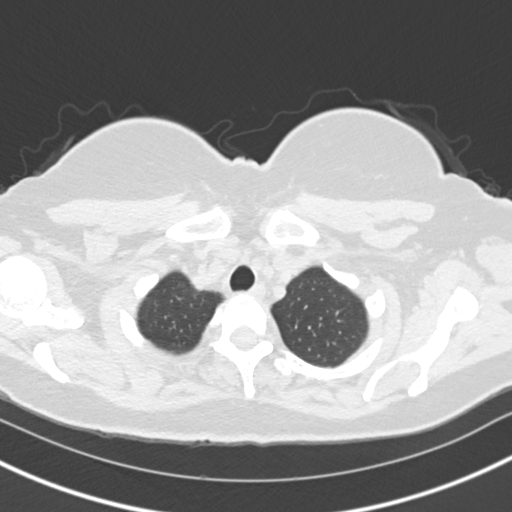
[im 130/141  lung]
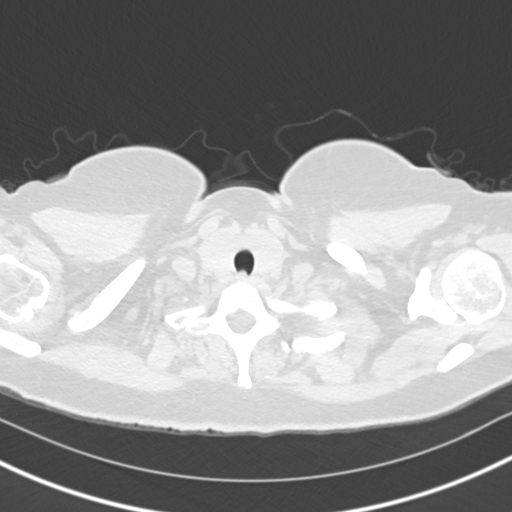

[Series 5: coronal · coronal · 0.61mm/px · 3 of 129 slices shown]
[im 26/129  lung]
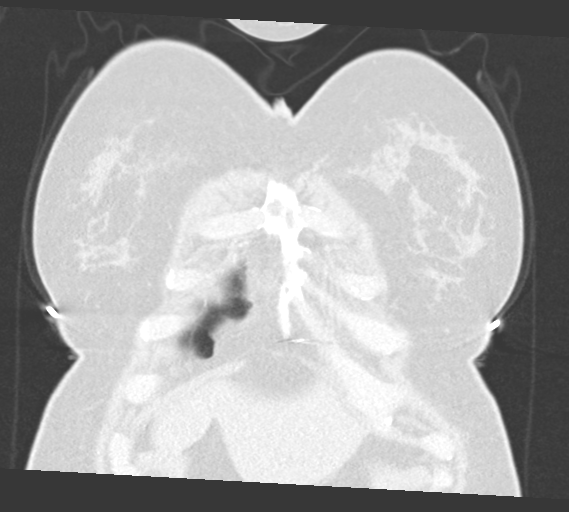
[im 52/129  lung]
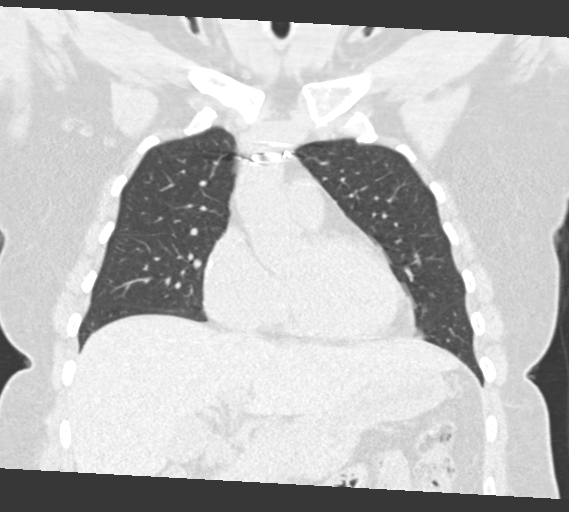
[im 77/129  lung]
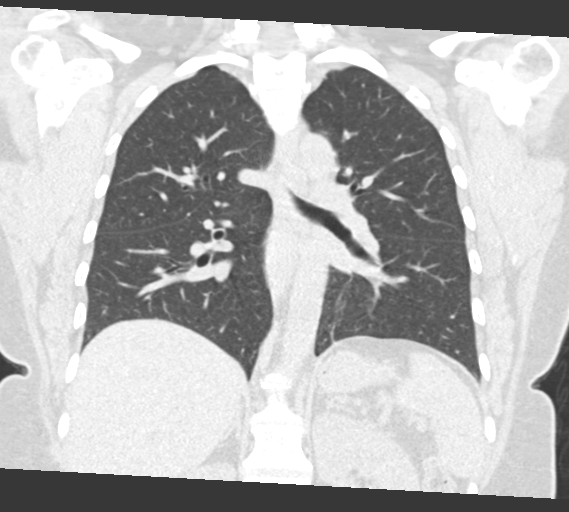

[15 of 36 positions shown; findings below may reference images not displayed]

FINDINGS: Cardiovascular: The heart size is mildly enlarged. The patient is
status post prior median sternotomy. No significant pericardial
effusion.

Mediastinum/Nodes:

-- No mediastinal lymphadenopathy.

-- No hilar lymphadenopathy.

-- No axillary lymphadenopathy.

-- No supraclavicular lymphadenopathy.

-- Normal thyroid gland where visualized.

-  Unremarkable esophagus.

Lungs/Pleura: There is an essentially stable 4 mm pulmonary nodule
in the right middle lobe. No new pulmonary nodule identified on
today's study. There is no pneumothorax. No large pleural effusion.
Trachea is unremarkable.

Upper Abdomen: There is no acute abnormality in the upper abdomen.

Musculoskeletal: No chest wall abnormality. No bony spinal canal
stenosis.
IMPRESSION: 1. Stable 4 mm pulmonary nodule in the right middle lobe.
2. Mild cardiomegaly.

## 2020-08-15 NOTE — Telephone Encounter (Signed)
TCT patient regarding her lab results. Spoke with patient and advised that all labs were normal-no evidence of a clotting disorder.  Advised to follow with neurologist and that she can come back here in the future if her needs change. She voiced understanding

## 2020-08-15 NOTE — Telephone Encounter (Signed)
-----   Message from Jaci Standard, MD sent at 08/13/2020 11:20 AM EST ----- Please let [name] know that all of her bloodwork has returned. We did not find any evidence of a clotting disorder. We checked for the most common clotting conditions and the ones most closely associated with stroke. All tests were negative. We recommend continued follow up with her neurologist. If there are future studies that show a clotting disorder we would be happy to see her back, but for now there is no need for routine f/u in our clinic.  ----- Message ----- From: Leory Plowman, [facility] [place] Sent: 08/01/2020   2:31 PM EST To: Jaci Standard, MD

## 2020-08-25 ENCOUNTER — Ambulatory Visit (INDEPENDENT_AMBULATORY_CARE_PROVIDER_SITE_OTHER): Payer: BC Managed Care – PPO

## 2020-08-25 DIAGNOSIS — I639 Cerebral infarction, unspecified: Secondary | ICD-10-CM | POA: Diagnosis not present

## 2020-08-28 LAB — CUP PACEART REMOTE DEVICE CHECK
Date Time Interrogation Session: 20220224084409
Implantable Pulse Generator Implant Date: 20211116

## 2020-09-01 NOTE — Progress Notes (Signed)
Carelink Summary Report / Loop Recorder 

## 2020-09-04 ENCOUNTER — Other Ambulatory Visit: Payer: Self-pay

## 2020-09-04 NOTE — Patient Outreach (Signed)
[facility]) Care Management  09/04/2020  JANUS VLCEK 03-25-1971 [id number]   Telephone outreach to patient to obtain mRS was successfully completed. MRS= 0  Thank you, [name] [facility] Care Management Assistant

## 2020-09-09 ENCOUNTER — Encounter: Payer: Self-pay | Admitting: *Deleted

## 2020-09-09 DIAGNOSIS — R911 Solitary pulmonary nodule: Secondary | ICD-10-CM

## 2020-09-17 ENCOUNTER — Ambulatory Visit: Payer: BC Managed Care – PPO | Admitting: Adult Health

## 2020-09-25 ENCOUNTER — Encounter (HOSPITAL_COMMUNITY)
Admission: EM | Disposition: A | Discharge status: Home / Self Care | Payer: Self-pay | Source: Home / Self Care | Attending: Neurology

## 2020-09-25 ENCOUNTER — Emergency Department (HOSPITAL_COMMUNITY): Payer: BC Managed Care – PPO

## 2020-09-25 ENCOUNTER — Other Ambulatory Visit: Payer: Self-pay

## 2020-09-25 ENCOUNTER — Inpatient Hospital Stay (HOSPITAL_COMMUNITY): Payer: BC Managed Care – PPO

## 2020-09-25 ENCOUNTER — Emergency Department (HOSPITAL_COMMUNITY): Payer: BC Managed Care – PPO | Admitting: Certified Registered Nurse Anesthetist

## 2020-09-25 ENCOUNTER — Ambulatory Visit (INDEPENDENT_AMBULATORY_CARE_PROVIDER_SITE_OTHER): Payer: BC Managed Care – PPO

## 2020-09-25 ENCOUNTER — Inpatient Hospital Stay (HOSPITAL_COMMUNITY)
Admission: EM | Admit: 2020-09-25 | Discharge: 2020-09-29 | DRG: 024 | Disposition: A | Discharge status: Home / Self Care | Payer: BC Managed Care – PPO | Attending: Neurology | Admitting: Neurology

## 2020-09-25 ENCOUNTER — Encounter (HOSPITAL_COMMUNITY): Payer: Self-pay | Admitting: Emergency Medicine

## 2020-09-25 DIAGNOSIS — G43909 Migraine, unspecified, not intractable, without status migrainosus: Secondary | ICD-10-CM | POA: Diagnosis not present

## 2020-09-25 DIAGNOSIS — Z83438 Family history of other disorder of lipoprotein metabolism and other lipidemia: Secondary | ICD-10-CM | POA: Diagnosis not present

## 2020-09-25 DIAGNOSIS — I6602 Occlusion and stenosis of left middle cerebral artery: Secondary | ICD-10-CM | POA: Diagnosis not present

## 2020-09-25 DIAGNOSIS — Z6832 Body mass index (BMI) 32.0-32.9, adult: Secondary | ICD-10-CM | POA: Diagnosis not present

## 2020-09-25 DIAGNOSIS — I639 Cerebral infarction, unspecified: Secondary | ICD-10-CM | POA: Diagnosis not present

## 2020-09-25 DIAGNOSIS — R4701 Aphasia: Secondary | ICD-10-CM | POA: Diagnosis present

## 2020-09-25 DIAGNOSIS — Z20822 Contact with and (suspected) exposure to covid-19: Secondary | ICD-10-CM | POA: Diagnosis present

## 2020-09-25 DIAGNOSIS — R29716 NIHSS score 16: Secondary | ICD-10-CM | POA: Diagnosis present

## 2020-09-25 DIAGNOSIS — R0902 Hypoxemia: Secondary | ICD-10-CM | POA: Diagnosis not present

## 2020-09-25 DIAGNOSIS — Z87891 Personal history of nicotine dependence: Secondary | ICD-10-CM | POA: Diagnosis not present

## 2020-09-25 DIAGNOSIS — I959 Hypotension, unspecified: Secondary | ICD-10-CM | POA: Diagnosis not present

## 2020-09-25 DIAGNOSIS — Z8774 Personal history of (corrected) congenital malformations of heart and circulatory system: Secondary | ICD-10-CM | POA: Diagnosis not present

## 2020-09-25 DIAGNOSIS — Z8673 Personal history of transient ischemic attack (TIA), and cerebral infarction without residual deficits: Secondary | ICD-10-CM | POA: Diagnosis not present

## 2020-09-25 DIAGNOSIS — Z91030 Bee allergy status: Secondary | ICD-10-CM | POA: Diagnosis not present

## 2020-09-25 DIAGNOSIS — I63412 Cerebral infarction due to embolism of left middle cerebral artery: Principal | ICD-10-CM | POA: Diagnosis present

## 2020-09-25 DIAGNOSIS — I6389 Other cerebral infarction: Secondary | ICD-10-CM | POA: Diagnosis not present

## 2020-09-25 DIAGNOSIS — R29810 Facial weakness: Secondary | ICD-10-CM | POA: Diagnosis not present

## 2020-09-25 DIAGNOSIS — E063 Autoimmune thyroiditis: Secondary | ICD-10-CM | POA: Diagnosis not present

## 2020-09-25 DIAGNOSIS — Z79899 Other long term (current) drug therapy: Secondary | ICD-10-CM

## 2020-09-25 DIAGNOSIS — E039 Hypothyroidism, unspecified: Secondary | ICD-10-CM | POA: Diagnosis not present

## 2020-09-25 DIAGNOSIS — E785 Hyperlipidemia, unspecified: Secondary | ICD-10-CM | POA: Diagnosis not present

## 2020-09-25 DIAGNOSIS — G8191 Hemiplegia, unspecified affecting right dominant side: Secondary | ICD-10-CM | POA: Diagnosis present

## 2020-09-25 DIAGNOSIS — N393 Stress incontinence (female) (male): Secondary | ICD-10-CM | POA: Diagnosis not present

## 2020-09-25 DIAGNOSIS — R4781 Slurred speech: Secondary | ICD-10-CM | POA: Diagnosis not present

## 2020-09-25 DIAGNOSIS — Z7902 Long term (current) use of antithrombotics/antiplatelets: Secondary | ICD-10-CM

## 2020-09-25 DIAGNOSIS — I63312 Cerebral infarction due to thrombosis of left middle cerebral artery: Secondary | ICD-10-CM | POA: Diagnosis not present

## 2020-09-25 HISTORY — PX: RADIOLOGY WITH ANESTHESIA: SHX6223

## 2020-09-25 HISTORY — PX: IR PERCUTANEOUS ART THROMBECTOMY/INFUSION INTRACRANIAL INC DIAG ANGIO: IMG6087

## 2020-09-25 HISTORY — PX: IR CT HEAD LTD: IMG2386

## 2020-09-25 LAB — CBC
HCT: 38.9 % (ref 36.0–46.0)
Hemoglobin: 13.1 g/dL (ref 12.0–15.0)
MCH: 32 pg (ref 26.0–34.0)
MCHC: 33.7 g/dL (ref 30.0–36.0)
MCV: 94.9 fL (ref 80.0–100.0)
Platelets: 220 10*3/uL (ref 150–400)
RBC: 4.1 MIL/uL (ref 3.87–5.11)
RDW: 12.4 % (ref 11.5–15.5)
WBC: 8.8 10*3/uL (ref 4.0–10.5)
nRBC: 0 % (ref 0.0–0.2)

## 2020-09-25 LAB — COMPREHENSIVE METABOLIC PANEL
ALT: 24 U/L (ref 0–44)
AST: 25 U/L (ref 15–41)
Albumin: 3.7 g/dL (ref 3.5–5.0)
Alkaline Phosphatase: 69 U/L (ref 38–126)
Anion gap: 10 (ref 5–15)
BUN: 16 mg/dL (ref 6–20)
CO2: 22 mmol/L (ref 22–32)
Calcium: 8.7 mg/dL — ABNORMAL LOW (ref 8.9–10.3)
Chloride: 104 mmol/L (ref 98–111)
Creatinine, Ser: 0.71 mg/dL (ref 0.44–1.00)
GFR, Estimated: >60 mL/min (ref >60)
Glucose, Bld: 121 mg/dL — ABNORMAL HIGH (ref 70–99)
Potassium: 3.3 mmol/L — ABNORMAL LOW (ref 3.5–5.1)
Sodium: 136 mmol/L (ref 135–145)
Total Bilirubin: 0.5 mg/dL (ref 0.3–1.2)
Total Protein: 6.8 g/dL (ref 6.5–8.1)

## 2020-09-25 LAB — DIFFERENTIAL
Abs Immature Granulocytes: 0.03 10*3/uL (ref 0.00–0.07)
Basophils Absolute: 0.1 10*3/uL (ref 0.0–0.1)
Basophils Relative: 1 %
Eosinophils Absolute: 0.4 10*3/uL (ref 0.0–0.5)
Eosinophils Relative: 5 %
Immature Granulocytes: 0 %
Lymphocytes Relative: 28 %
Lymphs Abs: 2.5 10*3/uL (ref 0.7–4.0)
Monocytes Absolute: 0.9 10*3/uL (ref 0.1–1.0)
Monocytes Relative: 11 %
Neutro Abs: 4.9 10*3/uL (ref 1.7–7.7)
Neutrophils Relative %: 55 %

## 2020-09-25 LAB — I-STAT CHEM 8, ED
BUN: 19 mg/dL (ref 6–20)
Calcium, Ion: 1.07 mmol/L — ABNORMAL LOW (ref 1.15–1.40)
Chloride: 104 mmol/L (ref 98–111)
Creatinine, Ser: 0.7 mg/dL (ref 0.44–1.00)
Glucose, Bld: 120 mg/dL — ABNORMAL HIGH (ref 70–99)
HCT: 37 % (ref 36.0–46.0)
Hemoglobin: 12.6 g/dL (ref 12.0–15.0)
Potassium: 3.3 mmol/L — ABNORMAL LOW (ref 3.5–5.1)
Sodium: 139 mmol/L (ref 135–145)
TCO2: 23 mmol/L (ref 22–32)

## 2020-09-25 LAB — APTT: aPTT: 26 seconds (ref 24–36)

## 2020-09-25 LAB — CBG MONITORING, ED: Glucose-Capillary: 116 mg/dL — ABNORMAL HIGH (ref 70–99)

## 2020-09-25 LAB — I-STAT BETA HCG BLOOD, ED (MC, WL, AP ONLY): I-stat hCG, quantitative: <5 m[IU]/mL (ref <5)

## 2020-09-25 LAB — RESP PANEL BY RT-PCR (FLU A&B, COVID) ARPGX2
Influenza A by PCR: NEGATIVE
Influenza B by PCR: NEGATIVE
SARS Coronavirus 2 by RT PCR: NEGATIVE

## 2020-09-25 LAB — PROTIME-INR
INR: 1 (ref 0.8–1.2)
Prothrombin Time: 13 seconds (ref 11.4–15.2)

## 2020-09-25 IMAGING — XA IR PERCUTANEOUS ART THORMBECTOMY/INFUSION INTRACRANIAL INCLUDE D
9 of 12 series · 11 of 24 positions shown · IV contrast (IODINE)
Comparison: CT angiogram of the head and neck [DATE].

INDICATION: New onset left gaze deviation, wrist and hand paresthesias and
aphasia. Occluded left middle cerebral artery superior division on
CT angiogram of the head and neck.

EXAM:
1. EMERGENT LARGE VESSEL OCCLUSION THROMBOLYSIS (anterior
CIRCULATION)
TECHNIQUE: Following a full explanation of the procedure along with the
potential associated complications, an informed witnessed consent
was obtained. The risks of intracranial hemorrhage of 10%, worsening
neurological deficit, ventilator dependency, death and inability to
revascularize were all reviewed in detail with the patient's spouse.

[Series 1: cerebral care 2 · 2 acquisitions, 1 frame shown]
[im 1/2]
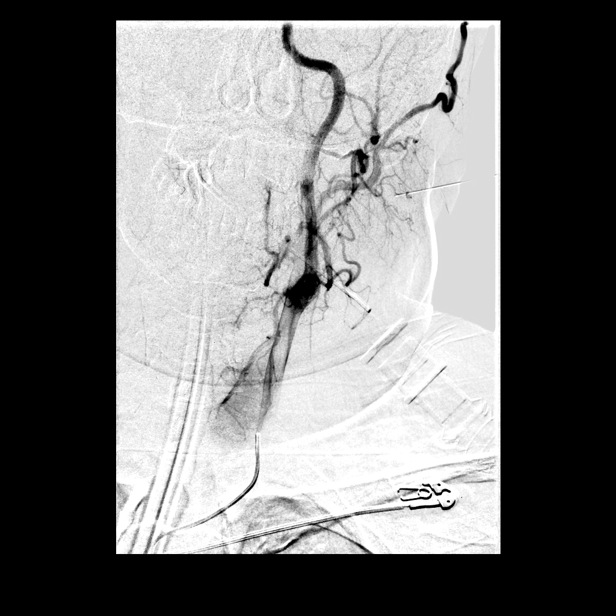

[Series 3: single · 1 of 2 slices shown]
[im 1/2]
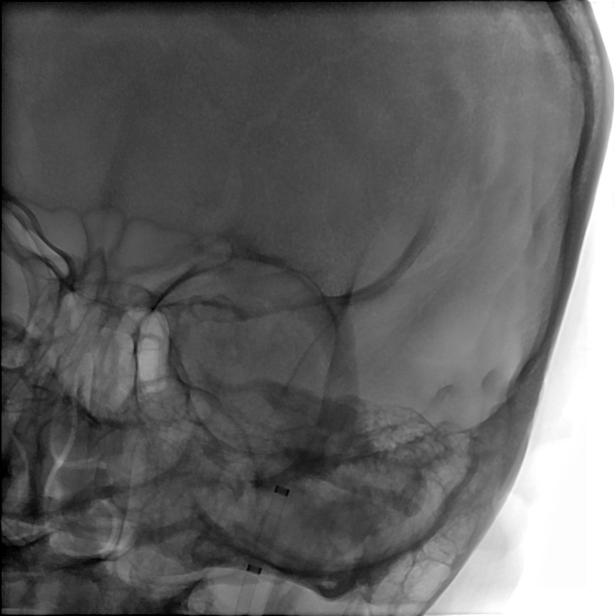

[Series 5: cerebral · 2 acquisitions, 1 frame shown (1 of 6)]
[im 1/2]
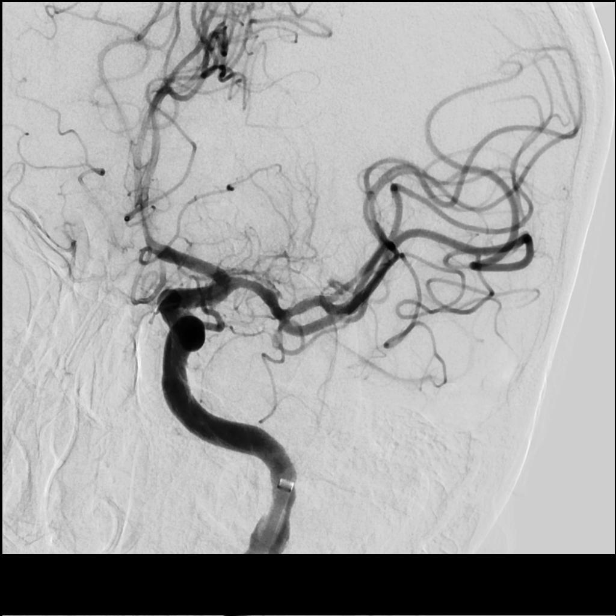

[Series 6: cerebral · 2 acquisitions, 1 frame shown (2 of 6)]
[im 1/2]
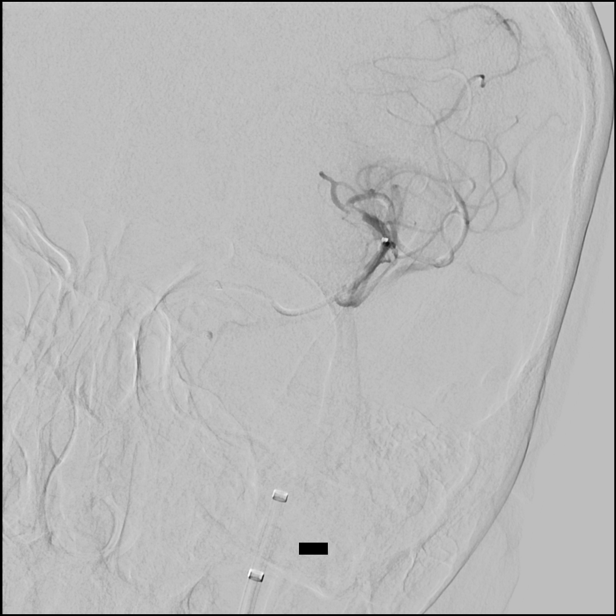

[Series 8: cerebral · 2 acquisitions, 1 frame shown (3 of 6)]
[im 1/2]
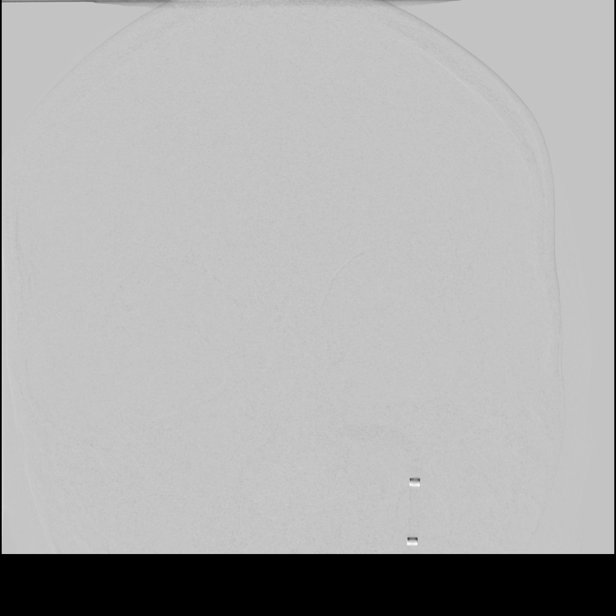

[Series 9: cerebral · 2 acquisitions, 1 frame shown (4 of 6)]
[im 1/2]
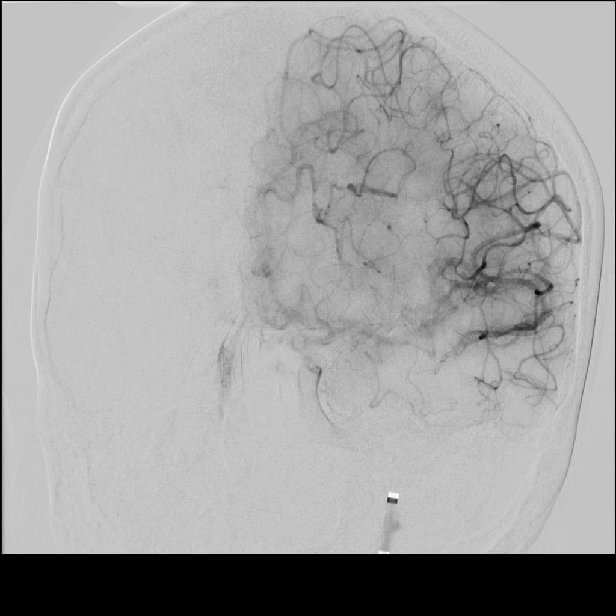

[Series 10: cerebral · 2 acquisitions, 1 frame shown (5 of 6)]
[im 1/2]
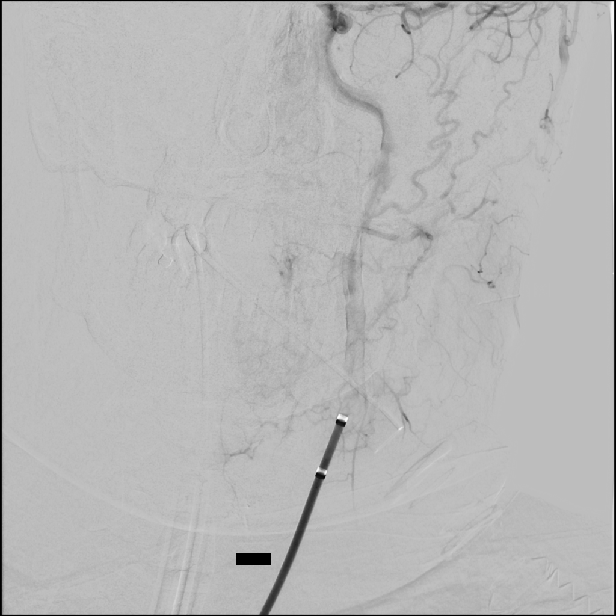

[Series 12: cerebral · 2 acquisitions, 1 frame shown (6 of 6)]
[im 1/2]
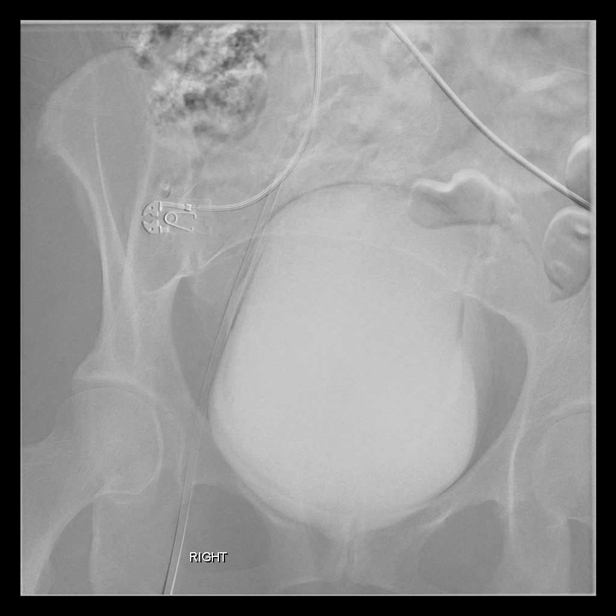

[Series 300: dr. (person_name). · 3 of 89 slices shown]
[im 1/89]
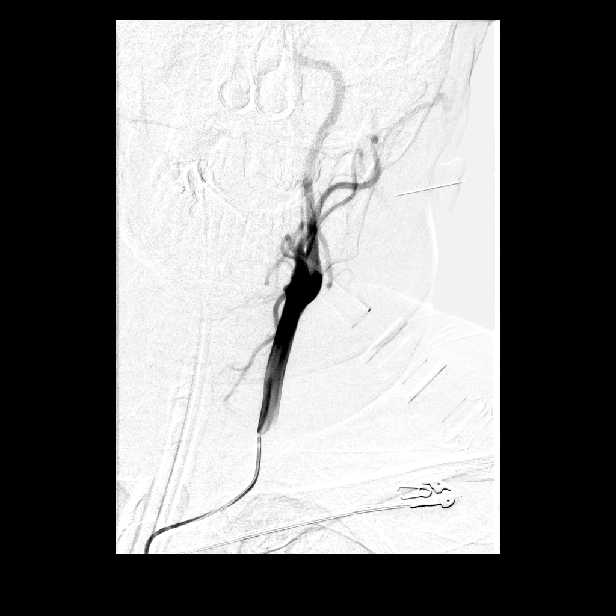
[im 33/89]
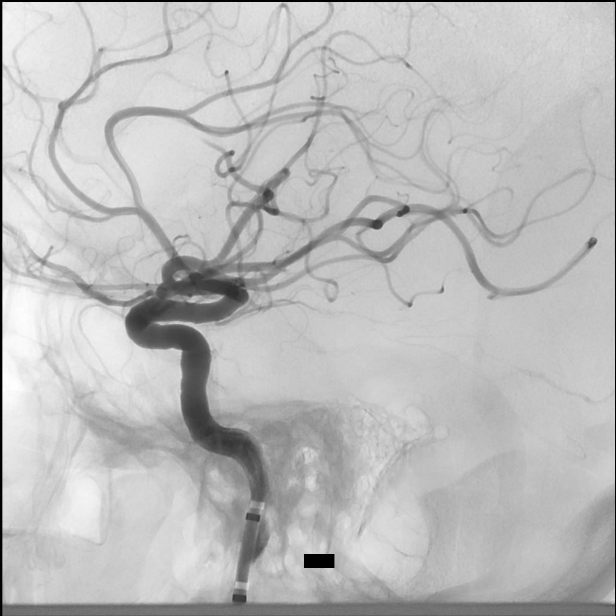
[im 73/89]
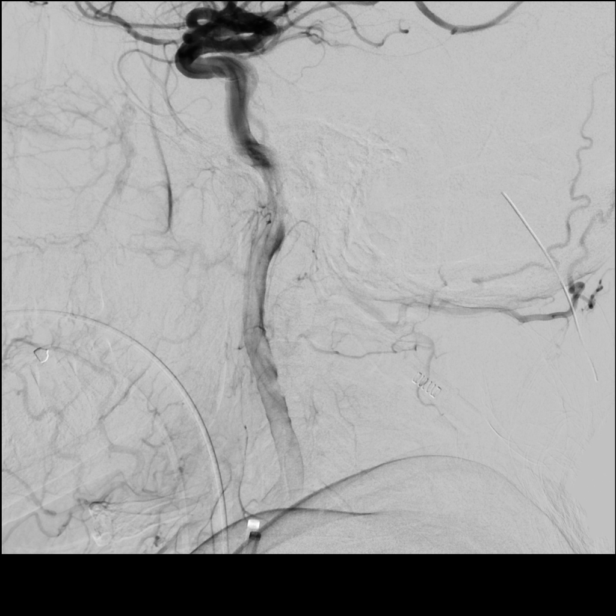

[11 of 24 positions shown; findings below may reference images not displayed]

MEDICATIONS:
Ancef 2 g IV antibiotic was administered within 1 hour of the
procedure.

ANESTHESIA/SEDATION:
General anesthesia

CONTRAST:  Isovue 300 approximately 50 mL

FLUOROSCOPY TIME:  Fluoroscopy Time: 28 minutes 30 seconds (1,100
mGy).

COMPLICATIONS:
None immediate.
The patient was then put under general anesthesia by the [REDACTED] at [HOSPITAL].

The right groin was prepped and draped in the usual sterile fashion.
Thereafter using modified Seldinger technique, transfemoral access
into the right common femoral artery was obtained without
difficulty. Over a 0.035 inch guidewire an 8 French 25 cm Pinnacle
sheath was inserted. Through this, and also over a 0.035 inch
guidewire a 5 ERROL 2 support catheter inside of an 087
balloon guide catheter was advanced to the aortic arch region and
selectively cannulated in the left common carotid artery to the
aortic arch region and selectively positioned in the left common
carotid artery at the bifurcation.
FINDINGS: The left common carotid arteriogram demonstrates the left external
carotid artery and its major branches to be widely patent.

The left internal carotid artery at the bulb to the cranial skull
base is widely patent.

The petrous, the cavernous and the supraclinoid segments are widely
patent.

The left anterior cerebral artery opacifies into the capillary and
venous phases. The left middle cerebral artery opacifies into the
capillary and venous phases.

Demonstrated is occlusion of the superior division of the left
middle cerebral artery in its mid M2 segment. A prominent area of
hypoperfusion is demonstrated on the delayed arterial and capillary
phases.

PROCEDURE:
Through the balloon guide catheter, a combination of an 055 136 cm
Zoom aspiration catheter inside of which was a 35 160 cm Zoom
aspiration Trevo ProVue microcatheter was advanced to the M1 segment
of the left middle cerebral artery. The micro guidewire was then
gently manipulated and advanced through the occluded superior
division projecting medially and then laterally followed by the 35
Zoom aspiration catheter which was buried in the proximal occluded
segment. The guidewire was removed. There was no aspiration at the
hub of the 35 Zoom aspiration catheter.

Constant aspiration was then applied with a 20 mL syringe at the hub
of the 35 Zoom aspiration catheter, and also at the 55 Zoom
aspiration catheter with a Penumbra aspiration device. The Zoom 55
had been advanced into the mid superior division. After
approximately 2 minutes, the combination of the 35 and the 55 Zoom
aspiration catheters was retrieved and removed. Proximal flow arrest
was reversed. A control arteriogram performed through the balloon
guide catheter now in the distal left internal carotid artery
demonstrated minimal change in the occluded distal superior
division.

A second pass was then made again using the above combination. The
35 Zoom aspiration catheter was advanced just proximal to the
occlusion. A gentle control arteriogram performed through this
demonstrated a large filling defect just distal to this extending
into the M3 segment.

An Aristotle 014 inch micro guidewire was then introduced through
the 35 Zoom aspiration catheter. The guidewire was advanced more
distally into the M3 M4 region followed by the 35 Zoom aspiration
catheter in the distal M3 segment. Aspiration was then applied with
a 20 mL syringe at the 55 Zoom aspiration catheter hub, and also at
the hub of the 55 Zoom aspiration catheter which was then advanced
more distally with proximal flow arrest. After approximately 2
minutes, the combination of the 35 Zoom and the 55 Zoom aspiration
catheter was retrieved and removed. Flow arrest was reversed.
Control arteriogram performed through the balloon guide in the
distal left ICA demonstrated now near complete revascularization of
the superior division of the left middle cerebral artery.

There was occlusion in the distal M4 region of this branch with a
focal area of hypoperfusion.

A TICI 2C revascularization was achieved.

Balloon guide was then retrieved into the left common carotid
artery. A control arteriogram performed through this demonstrated
patency of the left ICA extra cranially and intracranially. The left
anterior cerebral artery remained widely patent.

The left middle cerebral artery distribution demonstrated no
evidence of intraluminal filling defects or of occlusions except for
the distal M4 occlusion.

The balloon guide was removed. An 8 French Angio-Seal closure device
was applied for hemostasis in the right groin puncture site. Distal
pulses remained Dopplerable in all fours unchanged. A CT scan of the
brain demonstrated no evidence of gross hemorrhage or mass effect or
midline shift. A less than 1 cm area of contrast blush was noted in
the subcortical mid temporal/parietal region.

Patient's general anesthesia was then reversed and the patient was
extubated. Upon recovery the patient was able to ERROL simple
instructions appropriately.

She had minimal weakness in the right upper extremity with a right
facial droop. She exhibited no verbal output.

She was then transferred to neuro ICU for post revascularization
management.
IMPRESSION: Status post near complete revascularization of occluded superior
division of the left middle cerebral artery and the mid M2 segment
with 2 passes with contact aspiration and proximal aspiration
achieving a TICI 2C revascularization.

PLAN:
Follow-up in the clinic approximately 4 weeks post discharge.

## 2020-09-25 IMAGING — CT CT HEAD CODE STROKE
3 series · 15 of 47 positions shown, 18 images · non-contrast
Comparison: Prior MRI from [DATE].

CLINICAL DATA: Code stroke. Initial evaluation for acute
right-sided facial droop, right arm drift.

EXAM:
CT HEAD WITHOUT CONTRAST
TECHNIQUE: Contiguous axial images were obtained from the base of the skull
through the vertex without intravenous contrast.

[Series 3: head 5.0 st · axial · 0.44mm/px · z∈[+1195,+1325]mm · 9 of 32 slices shown, 12 images]
[im 3/32  brain]
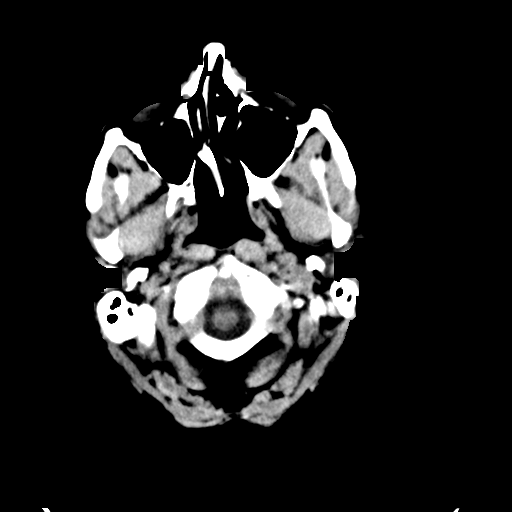
[im 3/32  bone]
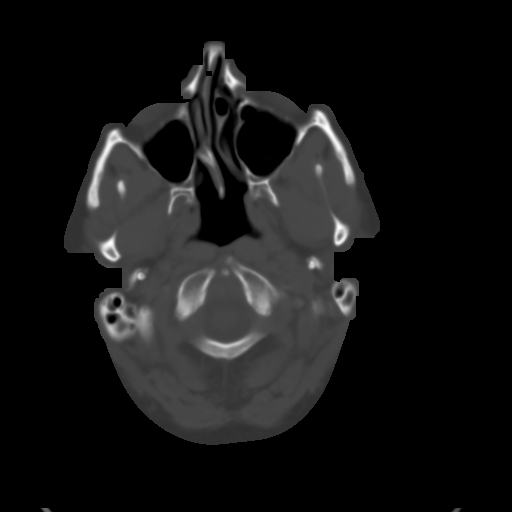
[im 6/32  brain]
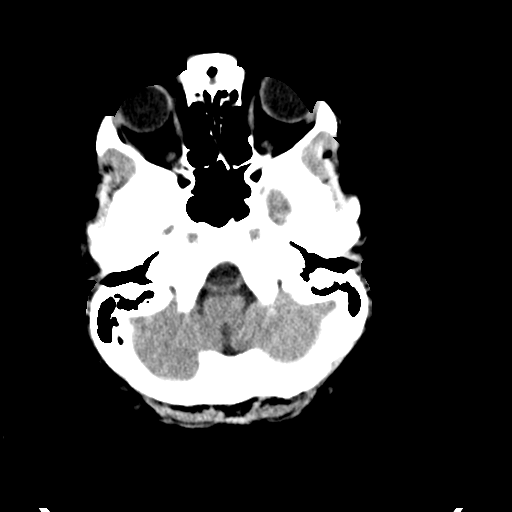
[im 9/32  brain]
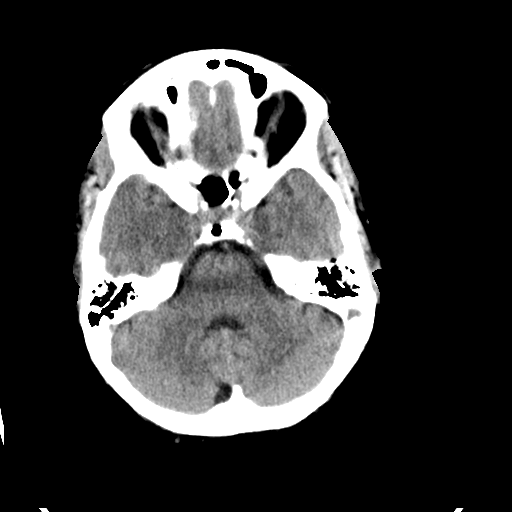
[im 12/32  brain]
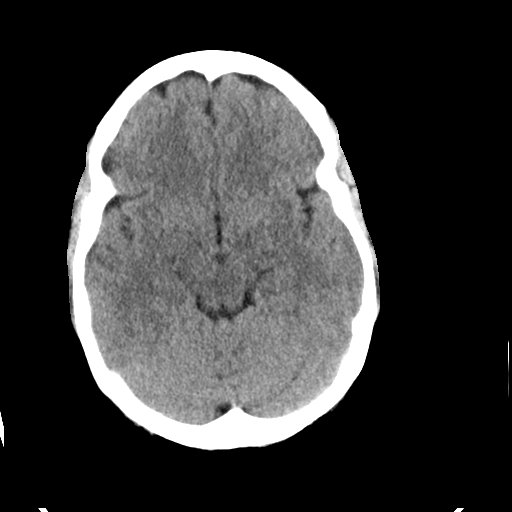
[im 17/32  brain]
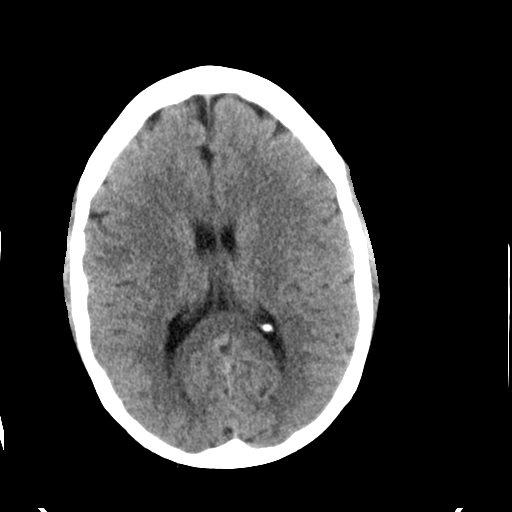
[im 17/32  bone]
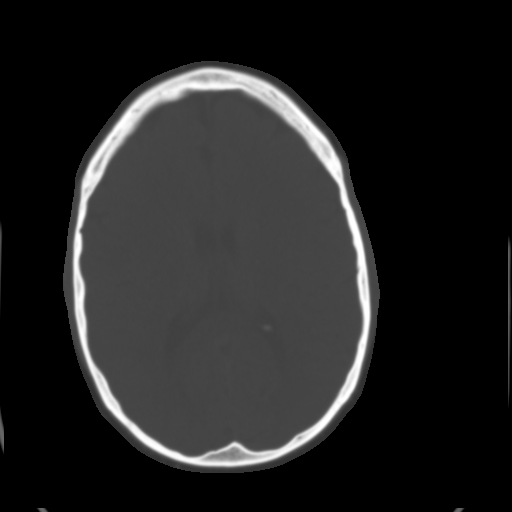
[im 20/32  brain]
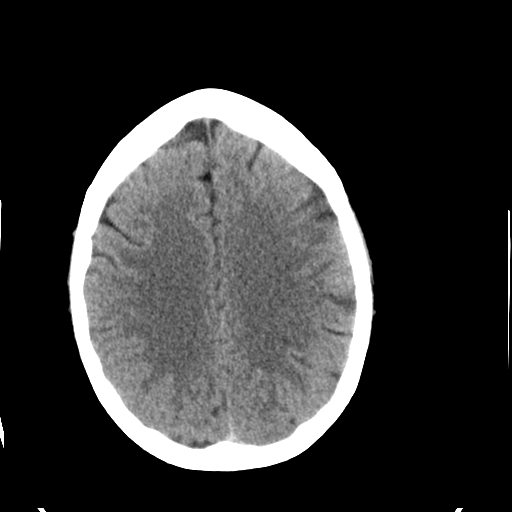
[im 23/32  brain]
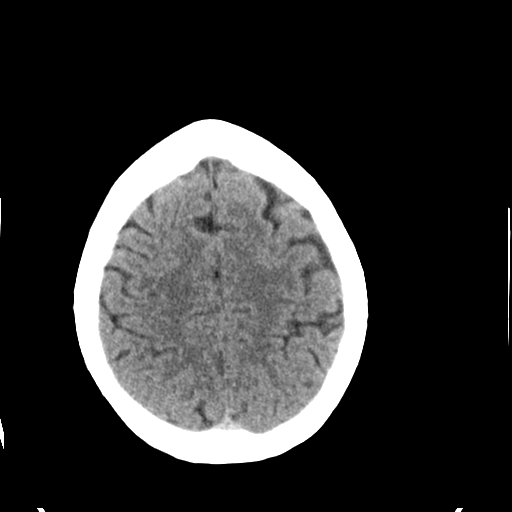
[im 26/32  brain]
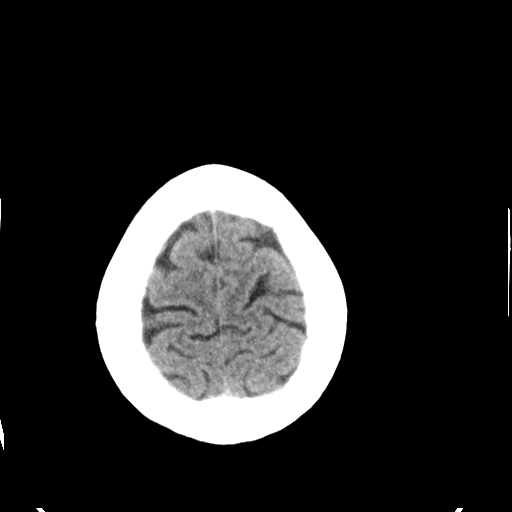
[im 29/32  brain]
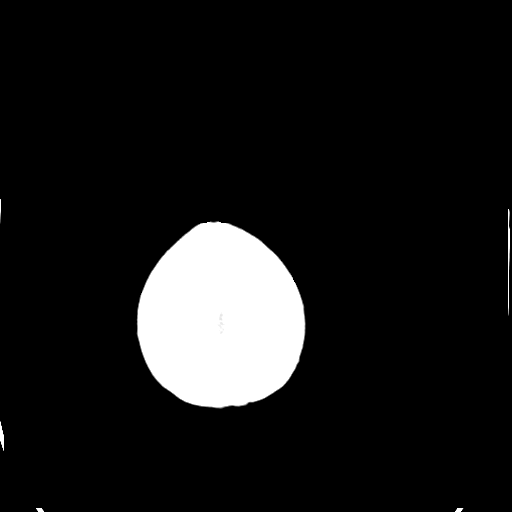
[im 29/32  bone]
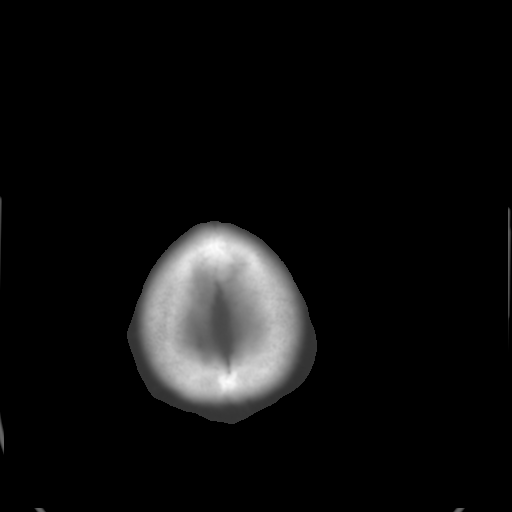

[Series 5: head 3.0 cor st · coronal · 0.30mm/px · 3 of 70 slices shown]
[im 24/70  brain]
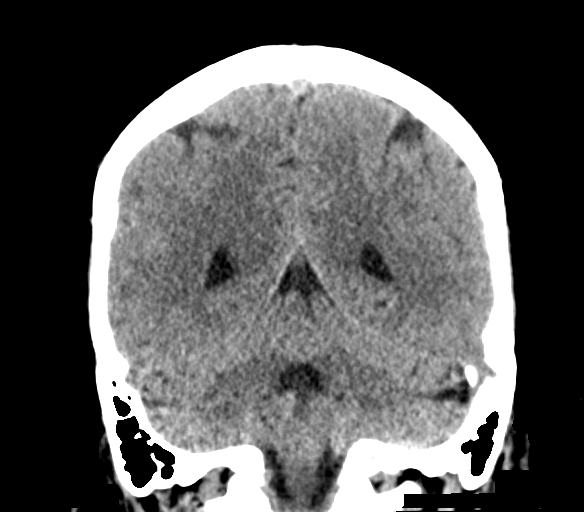
[im 31/70  brain]
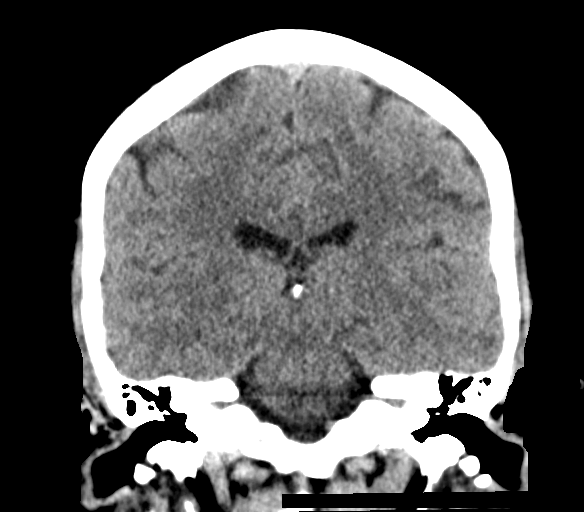
[im 39/70  brain]
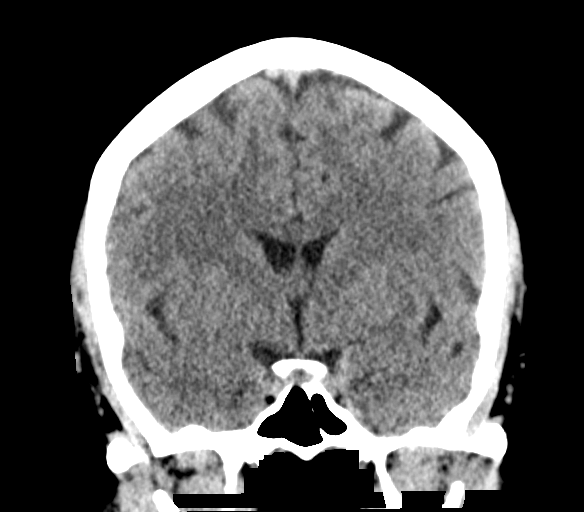

[Series 6: head 3.0 sag st · sagittal · 0.35mm/px · 3 of 67 slices shown]
[im 23/67  brain]
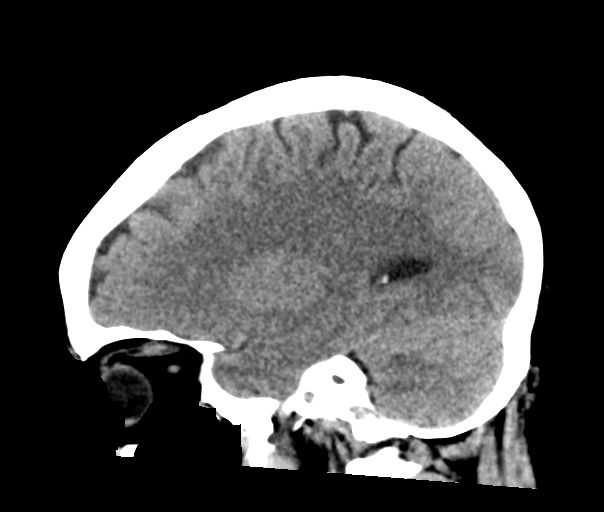
[im 34/67  brain]
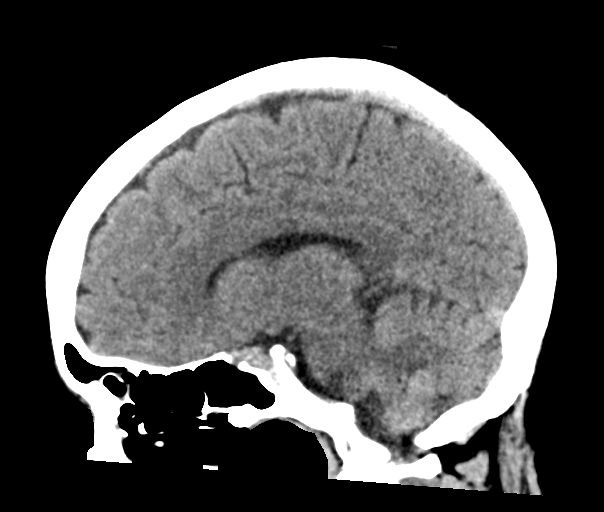
[im 45/67  brain]
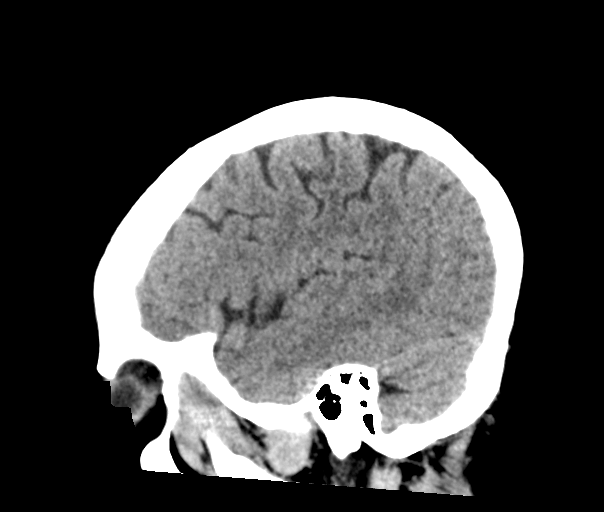

[15 of 47 positions shown; findings below may reference images not displayed]

FINDINGS: Brain: Cerebral volume within normal limits. No acute intracranial
hemorrhage. No acute large vessel territory infarct. No mass lesion,
midline shift or mass effect. No hydrocephalus or extra-axial fluid
collection.

Vascular: No hyperdense vessel.

Skull: Scalp soft tissues and calvarium within normal limits.

Sinuses/Orbits: Left gaze noted. Globes and orbital soft tissues
otherwise unremarkable. Left maxillary sinus retention cyst.
Additional scattered mucosal thickening noted within the ethmoidal
air cells and maxillary sinuses. Paranasal sinuses are otherwise
clear. No mastoid effusion.

Other: None.

ASPECTS (Alberta Stroke Program Early CT Score)

- Ganglionic level infarction (caudate, lentiform nuclei, internal
capsule, insula, M1-M3 cortex): 7

- Supraganglionic infarction (M4-M6 cortex): 3

Total score (0-10 with 10 being normal): 10
IMPRESSION: 1. Negative head CT.  No acute intracranial abnormality.
2. ASPECTS is 10.

These results were communicated to Dr. PANOSS at [DATE] pmon
[DATE]by text page via the [employer] messaging system.

## 2020-09-25 IMAGING — CT CT ANGIO NECK
2 of 7 series · 8 of 33 positions shown · IV contrast (omnipaque)
Comparison: Prior CT from earlier the same day as well as multiple
previous exams.

CLINICAL DATA: Initial evaluation for acute stroke, right-sided
facial droop, right arm drift.

EXAM:
CT ANGIOGRAPHY HEAD AND NECK
TECHNIQUE: Multidetector CT imaging of the head and neck was performed using
the standard protocol during bolus administration of intravenous
contrast. Multiplanar CT image reconstructions and MIPs were
obtained to evaluate the vascular anatomy. Carotid stenosis
measurements (when applicable) are obtained utilizing NASCET
criteria, using the distal internal carotid diameter as the
denominator.
CONTRAST:  75mL OMNIPAQUE IOHEXOL 350 MG/ML SOLN

[Series 5: cta neck · axial · 0.51mm/px · z∈[+1086,+1204]mm · 2 of 177 slices shown]
[im 59/177  soft-tissue]
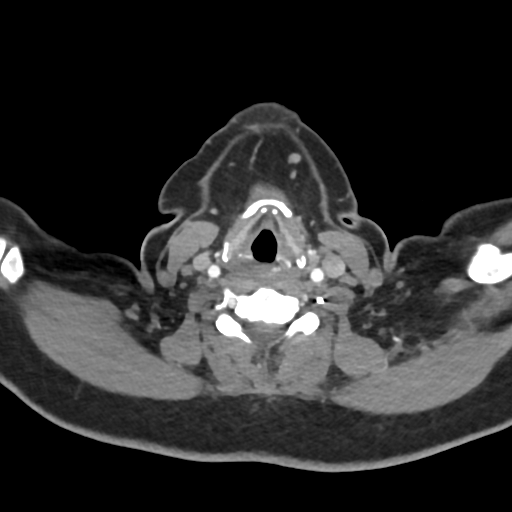
[im 118/177  soft-tissue]
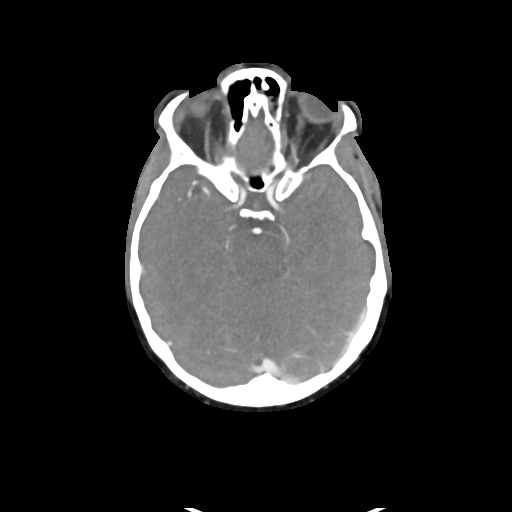

[Series 7: cta neck axial · axial · 0.39mm/px · z∈[+1021,+1271]mm · 6 of 352 slices shown]
[im 51/352  soft-tissue]
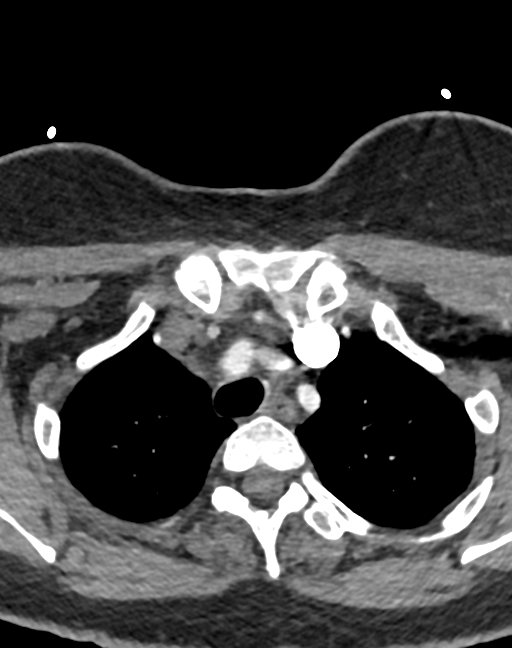
[im 101/352  bone]
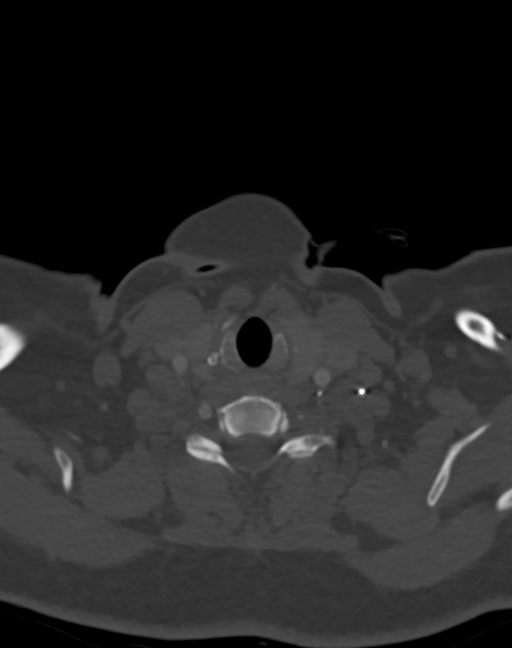
[im 151/352  soft-tissue]
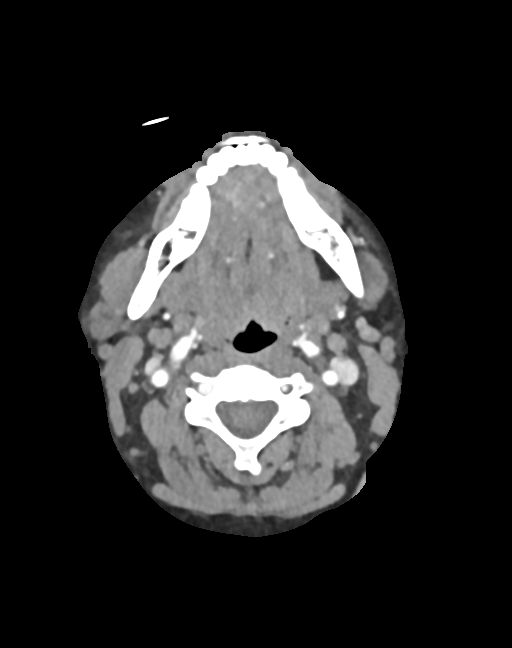
[im 201/352  bone]
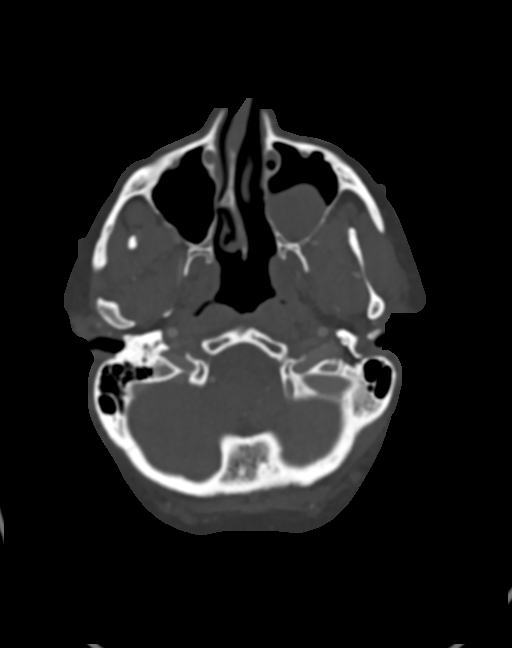
[im 251/352  soft-tissue]
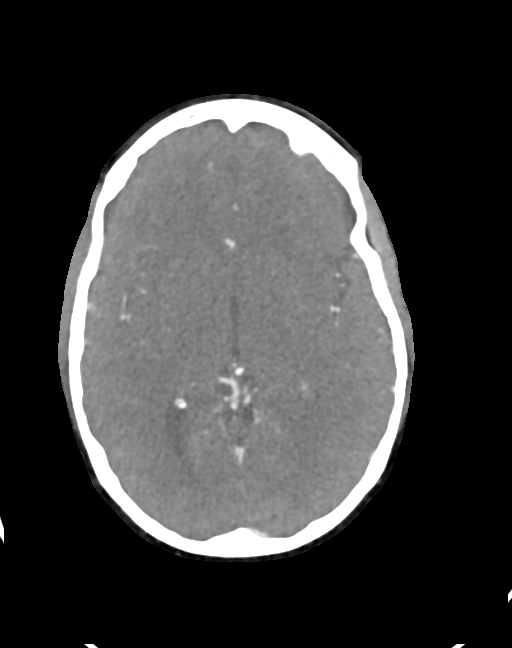
[im 301/352  bone]
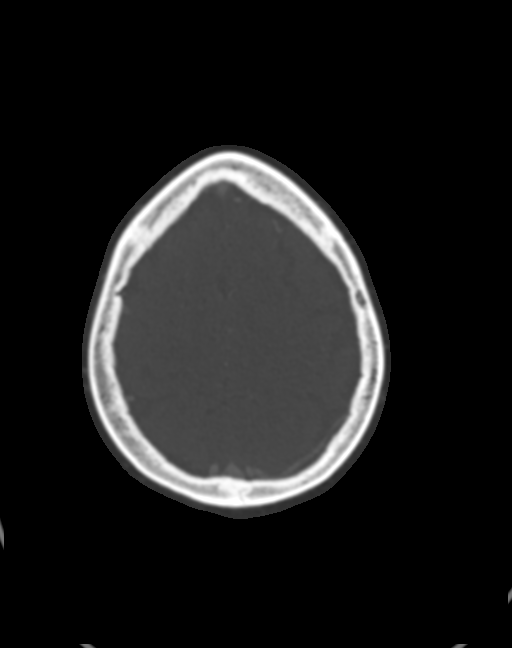

[8 of 33 positions shown; findings below may reference images not displayed]

FINDINGS: CTA NECK FINDINGS

Aortic arch: Visualized aortic arch normal in caliber with normal
branch pattern. No stenosis about the origin of the great vessels.

Right carotid system: Right common and internal carotid arteries
widely patent without stenosis, dissection or occlusion.

Left carotid system: Left common and internal carotid arteries
widely patent without stenosis, dissection or occlusion.

Vertebral arteries: Both vertebral arteries arise from the
subclavian arteries. Strongly dominant right vertebral artery with a
diffusely hypoplastic left vertebral artery. Vertebral arteries
widely patent within the neck without stenosis, dissection or
occlusion.

Skeleton: No visible acute osseous finding. No discrete or worrisome
osseous lesions. Mild to moderate cervical spondylosis at C5-6 and
C6-7.

Other neck: No other acute soft tissue abnormality within the neck.
No mass or adenopathy.

Upper chest: Visualized upper chest demonstrates no acute finding.
Surgical clips noted about the anterior mediastinum.

Review of the MIP images confirms the above findings

CTA HEAD FINDINGS

Anterior circulation: Both internal carotid arteries widely patent
to the termini without stenosis. A1 segments widely patent. Normal
anterior communicating artery complex. Anterior cerebral arteries
widely patent to their distal aspects. No M1 stenosis or occlusion.
Normal MCA bifurcations. On the left, there is acute occlusion of a
left M3 branch, superior division (series 7, image 95). Remainder of
the left MCA branches are perfused. Distal right MCA branches well
perfused as well.

Posterior circulation: Dominant right V4 segment widely patent to
the vertebrobasilar junction. Hypoplastic left vertebral artery
terminates in PICA. Both PICA origins patent and normal. Basilar
patent to its distal aspect without stenosis. Superior cerebellar
arteries patent bilaterally. Both PCAs primarily supplied via the
basilar well perfused to their distal aspects.

Venous sinuses: Patent.

Anatomic variants: Hypoplastic left vertebral artery terminates in
PICA. No aneurysm.

Review of the MIP images confirms the above findings
IMPRESSION: 1. Acute left M3 branch occlusion, superior division.
2. Otherwise negative CTA of the head and neck. No other large
vessel occlusion, hemodynamically significant stenosis, or other
acute vascular abnormality.

Critical Value/emergent results were discussed by telephone at the
time of interpretation on [DATE] at [DATE] with provider TOPP
TOPP.

## 2020-09-25 SURGERY — RADIOLOGY WITH ANESTHESIA
Anesthesia: General

## 2020-09-25 MED ORDER — ASPIRIN 81 MG PO CHEW
CHEWABLE_TABLET | ORAL | Status: AC
  Filled 2020-09-25: qty 1

## 2020-09-25 MED ORDER — SODIUM CHLORIDE 0.9 % IV SOLN
50.0000 mL | Freq: Once | INTRAVENOUS | Status: AC
  Administered 2020-09-25: 50 mL via INTRAVENOUS

## 2020-09-25 MED ORDER — ONDANSETRON HCL 4 MG/2ML IJ SOLN
INTRAMUSCULAR | Status: DC | PRN
  Administered 2020-09-25: 4 mg via INTRAVENOUS

## 2020-09-25 MED ORDER — PROPOFOL 10 MG/ML IV BOLUS
INTRAVENOUS | Status: DC | PRN
  Administered 2020-09-25: 120 mg via INTRAVENOUS

## 2020-09-25 MED ORDER — CEFAZOLIN SODIUM-DEXTROSE 2-4 GM/100ML-% IV SOLN
INTRAVENOUS | Status: AC
  Filled 2020-09-25: qty 100

## 2020-09-25 MED ORDER — STROKE: EARLY STAGES OF RECOVERY BOOK: Freq: Once | Status: AC

## 2020-09-25 MED ORDER — CANGRELOR TETRASODIUM 50 MG IV SOLR
INTRAVENOUS | Status: AC
  Filled 2020-09-25: qty 50

## 2020-09-25 MED ORDER — TICAGRELOR 90 MG PO TABS
ORAL_TABLET | ORAL | Status: AC
  Filled 2020-09-25: qty 2

## 2020-09-25 MED ORDER — VERAPAMIL HCL 2.5 MG/ML IV SOLN
INTRAVENOUS | Status: AC
  Filled 2020-09-25: qty 2

## 2020-09-25 MED ORDER — CLEVIDIPINE BUTYRATE 0.5 MG/ML IV EMUL
0.0000 mg/h | INTRAVENOUS | Status: DC
  Administered 2020-09-26 (×2): 2 mg/h via INTRAVENOUS
  Filled 2020-09-25 (×3): qty 50

## 2020-09-25 MED ORDER — NITROGLYCERIN 1 MG/10 ML FOR IR/CATH LAB
INTRA_ARTERIAL | Status: AC
  Filled 2020-09-25: qty 10

## 2020-09-25 MED ORDER — ROCURONIUM BROMIDE 10 MG/ML (PF) SYRINGE
PREFILLED_SYRINGE | INTRAVENOUS | Status: DC | PRN
  Administered 2020-09-25: 50 mg via INTRAVENOUS
  Administered 2020-09-25: 20 mg via INTRAVENOUS

## 2020-09-25 MED ORDER — SUCCINYLCHOLINE CHLORIDE 200 MG/10ML IV SOSY
PREFILLED_SYRINGE | INTRAVENOUS | Status: DC | PRN
  Administered 2020-09-25: 120 mg via INTRAVENOUS

## 2020-09-25 MED ORDER — SENNOSIDES-DOCUSATE SODIUM 8.6-50 MG PO TABS: 1.0000 | ORAL_TABLET | Freq: Every evening | ORAL | Status: DC | PRN

## 2020-09-25 MED ORDER — TIROFIBAN HCL IN NACL 5-0.9 MG/100ML-% IV SOLN
INTRAVENOUS | Status: AC
  Filled 2020-09-25: qty 100

## 2020-09-25 MED ORDER — FENTANYL CITRATE (PF) 100 MCG/2ML IJ SOLN
INTRAMUSCULAR | Status: AC
  Filled 2020-09-25: qty 2

## 2020-09-25 MED ORDER — CEFAZOLIN SODIUM-DEXTROSE 2-3 GM-%(50ML) IV SOLR
INTRAVENOUS | Status: DC | PRN
  Administered 2020-09-25: 2 g via INTRAVENOUS

## 2020-09-25 MED ORDER — EPTIFIBATIDE 20 MG/10ML IV SOLN
INTRAVENOUS | Status: AC
  Filled 2020-09-25: qty 10

## 2020-09-25 MED ORDER — ALTEPLASE (STROKE) FULL DOSE INFUSION
0.9000 mg/kg | Freq: Once | INTRAVENOUS | Status: AC
  Administered 2020-09-25: 79.7 mg via INTRAVENOUS
  Filled 2020-09-25: qty 79.7

## 2020-09-25 MED ORDER — CLOPIDOGREL BISULFATE 300 MG PO TABS
ORAL_TABLET | ORAL | Status: AC
  Filled 2020-09-25: qty 1

## 2020-09-25 MED ORDER — DEXAMETHASONE SODIUM PHOSPHATE 10 MG/ML IJ SOLN
INTRAMUSCULAR | Status: DC | PRN
  Administered 2020-09-25: 4 mg via INTRAVENOUS

## 2020-09-25 MED ORDER — SODIUM CHLORIDE 0.9 % IV SOLN: INTRAVENOUS | Status: DC

## 2020-09-25 MED ORDER — SODIUM CHLORIDE 0.9% FLUSH
3.0000 mL | Freq: Once | INTRAVENOUS | Status: AC
  Administered 2020-09-26: 3 mL via INTRAVENOUS

## 2020-09-25 MED ORDER — POTASSIUM CHLORIDE 10 MEQ/100ML IV SOLN
10.0000 meq | INTRAVENOUS | Status: AC
  Administered 2020-09-26 (×4): 10 meq via INTRAVENOUS
  Filled 2020-09-25 (×4): qty 100

## 2020-09-25 MED ORDER — ACETAMINOPHEN 325 MG PO TABS
650.0000 mg | ORAL_TABLET | ORAL | Status: DC | PRN
  Administered 2020-09-27 – 2020-09-28 (×4): 650 mg via ORAL
  Filled 2020-09-25 (×5): qty 2

## 2020-09-25 MED ORDER — SODIUM CHLORIDE 0.9 % IV SOLN: INTRAVENOUS | Status: DC | PRN

## 2020-09-25 MED ORDER — IOHEXOL 300 MG/ML  SOLN
150.0000 mL | Freq: Once | INTRAMUSCULAR | Status: AC | PRN
  Administered 2020-09-25: 25 mL via INTRA_ARTERIAL

## 2020-09-25 MED ORDER — SUGAMMADEX SODIUM 200 MG/2ML IV SOLN
INTRAVENOUS | Status: DC | PRN
  Administered 2020-09-25: 300 mg via INTRAVENOUS

## 2020-09-25 MED ORDER — LIDOCAINE 2% (20 MG/ML) 5 ML SYRINGE
INTRAMUSCULAR | Status: DC | PRN
  Administered 2020-09-25: 40 mg via INTRAVENOUS

## 2020-09-25 MED ORDER — IOHEXOL 350 MG/ML SOLN
75.0000 mL | Freq: Once | INTRAVENOUS | Status: AC | PRN
  Administered 2020-09-25: 75 mL via INTRAVENOUS

## 2020-09-25 MED ORDER — ACETAMINOPHEN 650 MG RE SUPP: 650.0000 mg | RECTAL | Status: DC | PRN

## 2020-09-25 MED ORDER — LABETALOL HCL 5 MG/ML IV SOLN: 20.0000 mg | Freq: Once | INTRAVENOUS | Status: DC

## 2020-09-25 MED ORDER — IOHEXOL 300 MG/ML  SOLN
50.0000 mL | Freq: Once | INTRAMUSCULAR | Status: AC | PRN
  Administered 2020-09-25: 25 mL via INTRA_ARTERIAL

## 2020-09-25 MED ORDER — ACETAMINOPHEN 160 MG/5ML PO SOLN: 650.0000 mg | ORAL | Status: DC | PRN

## 2020-09-25 MED ORDER — FENTANYL CITRATE (PF) 100 MCG/2ML IJ SOLN
INTRAMUSCULAR | Status: DC | PRN
  Administered 2020-09-25 (×2): 50 ug via INTRAVENOUS
  Administered 2020-09-25: 25 ug via INTRAVENOUS
  Administered 2020-09-25: 50 ug via INTRAVENOUS

## 2020-09-25 MED ORDER — IOHEXOL 240 MG/ML SOLN
INTRAMUSCULAR | Status: AC
  Filled 2020-09-25: qty 200

## 2020-09-25 MED ORDER — PANTOPRAZOLE SODIUM 40 MG IV SOLR: 40.0000 mg | Freq: Every day | INTRAVENOUS | Status: DC

## 2020-09-25 NOTE — ED Notes (Signed)
Transported to CT scan

## 2020-09-25 NOTE — Anesthesia Procedure Notes (Signed)
Arterial Line Insertion Start/End3/24/2022 10:10 PM Performed by: Jed Limerick, CRNA, CRNA  Patient location: Pre-op. Preanesthetic checklist: patient identified, IV checked, site marked, risks and benefits discussed, surgical consent, monitors and equipment checked, pre-op evaluation, timeout performed and anesthesia consent Lidocaine 1% used for infiltration Left, radial was placed Catheter size: 20 G Hand hygiene performed  and maximum sterile barriers used   Attempts: 1 Procedure performed without using ultrasound guided technique. Following insertion, dressing applied and Biopatch. Post procedure assessment: normal and unchanged  Patient tolerated the procedure well with no immediate complications.

## 2020-09-25 NOTE — Progress Notes (Signed)
PHARMACIST CODE STROKE RESPONSE  Notified to mix tPA at 0917 by Dr. Wilford Corner Delivered tPA to RN at [phone]  tPA dose = 8 mg bolus over 1 minute followed by 71.7 mg for a total dose of 79.7 mg over 1 hour  Issues/delays encountered (if applicable): N/A  Kinnie Feil, PharmD PGY1 Acute Care Pharmacy Resident 09/25/2020 9:25 PM  Please check [email or web address] for unit specific pharmacy phone numbers.

## 2020-09-25 NOTE — Procedures (Signed)
S/P Lt common carotid arteriogram followed by near complete revascularization of occluded Lt MCA sup division mid M2 seg with x 2 passes with contact aspiration with a 35 zoom and prox aspiration with a 55 zoom aspiration cath achieving  A TICI 2C revascularization. Post CT mild contrast stain in the infer temp frontal subcortical region. No mass effect  40F angioseal for hemostasis in the Rt groin . Distal pulses all present. Extubated. Pupils 2 mm RT = LT  RT facial droop. Squeezes equally with both hands. Moves LEs spontaneously and to instruction. S.Gilman Olazabal MD

## 2020-09-25 NOTE — Progress Notes (Signed)
Patient ID: [name], female   DOB: [birth year], 50 y.o.   MRN: [id number] INR. $48 Y RT H F MRS 0 LSW 8.20 pm New onset aphasia Rt sided weakness and Lt gaze deviation .  CT brain NO ICH .ASPECTS 10 CTA occluded Lt MCA sup division mid M2 seg . Endovascular treatment D/W husband. Reasons,procedure and alternatives reviewed. Risks of ICH of 10 % ,worsening neuro function,death and inability to revascularize reviewed.Marland KitchenSpouse expressed understanding and provided consent for the treament . S.DEveshwar MD

## 2020-09-25 NOTE — ED Notes (Addendum)
Transported to IR 

## 2020-09-25 NOTE — Code Documentation (Signed)
Stroke Response Nurse Documentation Code Documentation  [name] is a 50 y.o. female arriving to [facility] ED via [facility] on 3/24 with past medical hx of prior CVA, migraines. Code stroke was activated by EMS. Patient from home where she was LKW at 2025 and now complaining of right facial droop, right sided weakness and aphasia . On aspirin 81 mg daily and clopidogrel 75 mg daily. Stroke team at the bedside on patient arrival. Labs drawn and patient cleared for CT by Dr. Francene Castle. Patient to CT with team. NIHSS 16, see documentation for details and code stroke times. Patient with not following commands, left gaze preference , right hemianopia, right facial droop, right leg weakness, right decreased sensation, Global aphasia , dysarthria  and Sensory  neglect on exam. The following imaging was completed:  CTA head and neck. Patient is a candidate for tPA due to fixed deficit. Bedside handoff with IR RN [name].    [name]  Rapid Response RN

## 2020-09-25 NOTE — H&P (Signed)
STROKE NEUROLOGY H&P  CC: Aphasia, right-sided weakness  History is obtained from: Chart review, husband and daughter  HPI: [name] is a 50 y.o. 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  The family noticed that she had a right facial droop and was having difficulty bringing her words out as they were sitting together.  EMS was called and her symptoms progressed to right-sided weakness, leftward gaze preference and inability to talk or understand along with worsening right-sided weakness throughout her right in the EMS truck. Initial evaluation-see examination below with an NIH of 17 concerning for a large hemispheric stroke involving the left MCA territory. Noncontrast head CT negative for bleed.  IV TPA initiated. CT angiography completed-acute left M2/M3 occlusion noted. Case discussed with endovascular-Dr. Corliss Skains as patient is a candidate for EVT and he agreed to proceed if the family consented.  Discussed the risks and benefits of the procedure including that of hemorrhage with the husband who agreed to proceed. IR code stroke activated and patient taken in for diagnostic cerebral angiogram followed by attempted thrombectomy.   LKW: 8:20 PM on 09/25/2020 tpa given?:  Yes Modified Rankin: 0-no deficits from prior strokes-had made complete recovery including recovery from clumsiness of the left hand that was remaining after the last stroke.  ROS: Attempted but unable to perform due to patient's aphasia-family reports no preceding illness or sicknesses and  she was not complaining of any issues.  Past Medical History:  Diagnosis Date  . Hx of migraines   . Stroke [facility])    06/03/20    Family History  Problem Relation Age of Onset  . Hypertension Mother   . Hypothyroidism Mother   . High Cholesterol Mother   . Atrial fibrillation Mother   . COPD Father   . Emphysema Father   . Hypothyroidism Sister     Social History:   reports that she has quit smoking. She has never used smokeless tobacco. She reports current alcohol use of about 1.0 standard drink of alcohol per week. She reports that she does not use drugs.  Works in a dermatology office. Medications  Current Facility-Administered Medications:  .   stroke: mapping our early stages of recovery book, , Does not apply, Once, [name], MD .  alteplase (ACTIVASE) 1 mg/mL infusion 79.7 mg, 0.9 mg/kg, Intravenous, Once **FOLLOWED BY** 0.9 %  sodium chloride infusion, 50 mL, Intravenous, Once, [name], MD .  0.9 %  sodium chloride infusion, , Intravenous, Continuous, [name], MD .  acetaminophen (TYLENOL) tablet 650 mg, 650 mg, Oral, Q4H PRN **OR** acetaminophen (TYLENOL) 160 MG/5ML solution 650 mg, 650 mg, Per Tube, Q4H PRN **OR** acetaminophen (TYLENOL) suppository 650 mg, 650 mg, Rectal, Q4H PRN, [name], MD .  labetalol (NORMODYNE) injection 20 mg, 20 mg, Intravenous, Once **AND** clevidipine (CLEVIPREX) infusion 0.5 mg/mL, 0-21 mg/hr, Intravenous, Continuous, [name], MD .  fentaNYL (SUBLIMAZE) 100 MCG/2ML injection, , , ,  .  pantoprazole (PROTONIX) injection 40 mg, 40 mg, Intravenous, QHS, [name], MD .  senna-docusate (Senokot-S) tablet 1 tablet, 1 tablet, Oral, QHS PRN, [name], MD .  sodium chloride flush (NS) 0.9 % injection 3 mL, 3 mL, Intravenous, Once, Cathren LaineSteinl, Kevin, MD  Current Outpatient Medications:  .  atorvastatin (LIPITOR) 20 MG tablet, Take 1 tablet (20 mg total) by mouth daily., Disp: 90 tablet, Rfl: 3 .  clopidogrel (PLAVIX)  75 MG tablet, Take 1 tablet (75 mg total) by mouth daily., Disp: 30 tablet, Rfl: 2  Facility-Administered Medications Ordered in Other Encounters:  .  rocuronium bromide 10 mg/mL (PF) syringe, , Intravenous, Anesthesia Intra-op, Jed LimerickHarder, Blaire S, CRNA, 50 mg at 09/25/20 2203  Exam: Current vital signs: BP (!) 131/94   Pulse 90   Temp 99 F (37.2 C) (Oral)   Resp 17   Ht 5\' 5"  (1.651 m)   Wt 88.5 kg   SpO2 96%   BMI 32.47 kg/m  Vital signs in last 24 hours: Temp:  [99 F (37.2 C)] 99 F (37.2 C) (03/24 2129) Pulse Rate:  [83-90] 90 (03/24 2130) Resp:  [17-18] 17 (03/24 2130) BP: (127-131)/(68-94) 131/94 (03/24 2130) SpO2:  [96 %-98 %] 96 % (03/24 2130) Weight:  [88.5 kg] 88.5 kg (03/24 2139) General: Awake alert in no distress HEENT: Normocephalic atraumatic Lungs: Clear Cardiovascular: S1-S2 heard regular rate rhythm Extremities warm well perfused with no edema Abdomen nondistended nontender Neurological exam She is awake alert and nods to all questions asked but unclear if it is meaningful. She is completely nonverbal She is able to mimic some simple commands. Cranial nerves: Pupils are equal round reactive to light, she has a strong left gaze preference, she is able to come to the midline and may be a little past the midline but is not able to look all the way to the right, she has right homonymous hemianopsia, she has complete weakness of the right lower face Motor exam: Her right upper extremity has mild vertical drift and more grip strength weakness compared to the left arm which is 5/5.  Both her legs are equal strength without vertical drift. Sensation: Diminished on the right with some neglect Coordination: Difficult to assess given her aphasia NIH stroke scale: 17 Labs I have reviewed labs in epic and the results pertinent to this consultation are:   CBC    Component Value Date/Time   WBC 8.8 09/25/2020 2118   RBC 4.10 09/25/2020 2118   HGB 13.1 09/25/2020  2118   HGB 12.6 09/25/2020 2118   HGB 12.9 08/01/2020 1405   HCT 38.9 09/25/2020 2118   HCT 37.0 09/25/2020 2118   PLT 220 09/25/2020 2118   PLT 282 08/01/2020 1405   MCV 94.9 09/25/2020 2118   MCH 32.0 09/25/2020 2118   MCHC 33.7 09/25/2020 2118   RDW 12.4 09/25/2020 2118   LYMPHSABS 2.5 09/25/2020 2118   MONOABS 0.9 09/25/2020 2118   EOSABS 0.4 09/25/2020 2118   BASOSABS 0.1 09/25/2020 2118    CMP     Component Value Date/Time   NA 136 09/25/2020 2118   NA 139 09/25/2020 2118   NA 138 05/12/2020 1652   K 3.3 (L) 09/25/2020 2118   K 3.3 (L) 09/25/2020 2118   CL 104 09/25/2020 2118   CL 104 09/25/2020 2118   CO2 22 09/25/2020 2118   GLUCOSE 121 (H) 09/25/2020 2118   GLUCOSE 120 (H) 09/25/2020 2118   BUN 16 09/25/2020 2118   BUN 19 09/25/2020 2118   BUN 10 05/12/2020 1652   CREATININE 0.71 09/25/2020 2118   CREATININE 0.70 09/25/2020 2118   CREATININE 0.75 08/01/2020 1405   CALCIUM  8.7 (L) 09/25/2020 2118   PROT 6.8 09/25/2020 2118   ALBUMIN 3.7 09/25/2020 2118   AST 25 09/25/2020 2118   AST 14 (L) 08/01/2020 1405   ALT 24 09/25/2020 2118   ALT 19 08/01/2020 1405   ALKPHOS 69 09/25/2020 2118   BILITOT 0.5 09/25/2020 2118   BILITOT 0.5 08/01/2020 1405   GFRNONAA >60 09/25/2020 2118   GFRNONAA >60 08/01/2020 1405   GFRAA 106 05/12/2020 1652    Lipid Panel     Component Value Date/Time   CHOL 136 06/04/2020 0355   CHOL 160 05/20/2020 0909   TRIG 109 06/04/2020 0355   HDL 50 06/04/2020 0355   HDL 56 05/20/2020 0909   CHOLHDL 2.7 06/04/2020 0355   VLDL 22 06/04/2020 0355   LDLCALC 64 06/04/2020 0355   LDLCALC 80 05/20/2020 0909     Imaging I have reviewed the images obtained: CT head aspects 10, no bleed. CTA head and neck with acute left M2/M3 branch occlusion.  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 NIH stroke scale 17. Noncontrast head CT negative for bleed. IV TPA initiated CTA completed-left M2/M3 superior division occlusion.  Case discussed with family and endovascular-Dr. Corliss Skains.  Agreed to proceed after husband consented after reviewing risk benefits. Etiology remains cryptogenic but strong suspicion for a central embolic source. Has a loop recorder-so far unremarkable.  Assessment:  Plan: Acute Ischemic Stroke Cerebral infarction due to embolism of left middle cerebral artery   Acuity: Acute Current Suspected Etiology: Embolic-prior work-up has remain unremarkable Continue Evaluation:  -Admit to: Neurological ICU -Hold Aspirin until 24 hour post tPA neuroimaging is stable and without evidence of bleeding -Blood pressure control-post TPA goal less than 180 but if revascularization via thrombectomy successful, the goal will be 120-140.  Await final orders after thrombectomy -MRI/ECHO/A1C/Lipid panel. -Hyperglycemia management per SSI to maintain glucose 140-180mg /dL. -PT/OT/ST therapies and recommendations when able  CNS Dysarthria Dysphagia following cerebral infarction  -NPO until cleared by speech -ST -Advance diet as tolerated  Hemiplegia and hemiparesis following cerebral infarction affecting right non-dominant side  -PT/OT -PM&R consult  RESP Intubated for the procedure Extubate when able to If remains intubated, vent management per PCCM  CV Blood pressure goals to be finally determined after intervention. At all times should remain less than 180 as she has received TPA. If successful thrombectomy, blood pressure goal would be 120-140 systolic. Labetalol and hydralazine as needed as well as Cleviprex drips ordered in case needed. 2D echocardiogram, may  need repeat TEE  Hyperlipidemia, unspecified  - Statin for goal LDL < 70  No evidence of atrial fibrillation-has a loop recorder in place.  Will need loop recorder interrogation.  HEME No active issues Monitor CBC in the morning.  ENDO History of Hashimoto's thyroiditis Currently on no meds No active issues  GI/GU No active issues Gentle hydration 75 cc of normal saline per hour.  Fluid/Electrolyte Disorders Hypokalemia -Replete Check labs in the morning  ID Possible Aspiration PNA -CXR -NPO -Monitor  Nutrition E66.9 Obesity  -diet consult  Prophylaxis DVT: SCD GI: PPI Bowel: Docusate senna  Diet: NPO until cleared by speech  Code Status: Full Code   Discussed the plan in detail with the husband and  daughter at bedside.  Answered all their questions.  Discussed the plan with Dr. Corliss Skains.  Currently in IR.  Will update recommendations based on the outcome of the procedure.   THE FOLLOWING WERE PRESENT ON ADMISSION: Acute ischemic stroke, hemiparesis, possible aspiration pneumonia, history of ASD with closure 30 years ago.  -- [name], MD Neurologist [facility] Pager: [phone]   CRITICAL CARE ATTESTATION Performed by: [name], MD Total critical care time:70 minutes Critical care time was exclusive of separately billable procedures and treating other patients and/or supervising APPs/Residents/Students Critical care was necessary to treat or prevent imminent or life-threatening deterioration due to acute ischemic stroke, IV thrombolysis, decision for EVT, high risk for neurological deterioration due to recurrent stroke and hemorrhage. This patient is critically ill and at significant risk for neurological worsening and/or death and care requires constant monitoring. Critical care was time spent personally by me on the following activities: development of treatment plan with patient and/or surrogate as well as nursing, discussions  with consultants, evaluation of patient's response to treatment, examination of patient, obtaining history from patient or surrogate, ordering and performing treatments and interventions, ordering and review of laboratory studies, ordering and review of radiographic studies, pulse oximetry, re-evaluation of patient's condition, participation in multidisciplinary rounds and medical decision making of high complexity in the care of this patient.

## 2020-09-25 NOTE — Anesthesia Preprocedure Evaluation (Addendum)
Anesthesia Evaluation  Patient identified by MRN, date of birth, ID band  Reviewed: Allergy & Precautions, Patient's Chart, lab work & pertinent test results, Unable to perform ROS - Chart review onlyPreop documentation limited or incomplete due to emergent nature of procedure.  Airway Mallampati: III       Dental no notable dental hx. (+) Teeth Intact   Pulmonary former smoker,    Pulmonary exam normal        Cardiovascular Normal cardiovascular exam     Neuro/Psych CVA    GI/Hepatic   Endo/Other  Hypothyroidism   Renal/GU      Musculoskeletal   Abdominal   Peds  Hematology   Anesthesia Other Findings   Reproductive/Obstetrics                             Anesthesia Physical Anesthesia Plan  ASA: IV and emergent  Anesthesia Plan: General   Post-op Pain Management:    Induction: Intravenous, Rapid sequence and Cricoid pressure planned  PONV Risk Score and Plan: 3 and Ondansetron and Treatment may vary due to age or medical condition  Airway Management Planned: Oral ETT  Additional Equipment: Arterial line  Intra-op Plan:   Post-operative Plan: Possible Post-op intubation/ventilation  Informed Consent: I have reviewed the patients History and Physical, chart, labs and discussed the procedure including the risks, benefits and alternatives for the proposed anesthesia with the patient or authorized representative who has indicated his/her understanding and acceptance.     Consent reviewed with POA, Only emergency history available and History available from chart only  Plan Discussed with: CRNA  Anesthesia Plan Comments:         Anesthesia Quick Evaluation

## 2020-09-25 NOTE — ED Triage Notes (Signed)
Patient arrived with EMS from home , LSN 2025 this evening with right facial droop, left side gaze and left arm and leg numbness with weakness , CBG= 114 by EMS, stroke MD evaluated patient at arrival and transported to CT scan with RN and rapid response RN .

## 2020-09-25 NOTE — Anesthesia Procedure Notes (Signed)
Procedure Name: Intubation Date/Time: 09/25/2020 9:56 PM Performed by: Jed Limerick, CRNA Pre-anesthesia Checklist: Patient identified, Emergency Drugs available, Suction available and Patient being monitored Patient Re-evaluated:Patient Re-evaluated prior to induction Oxygen Delivery Method: Circle System Utilized Preoxygenation: Pre-oxygenation with 100% oxygen Induction Type: IV induction, Rapid sequence and Cricoid Pressure applied Laryngoscope Size: Glidescope and 3 Grade View: Grade I Tube type: Oral Tube size: 7.5 mm Number of attempts: 1 Airway Equipment and Method: Video-laryngoscopy and Rigid stylet Placement Confirmation: ETT inserted through vocal cords under direct vision,  positive ETCO2 and breath sounds checked- equal and bilateral Secured at: 22 cm Tube secured with: Tape Dental Injury: Injury to lip

## 2020-09-26 ENCOUNTER — Encounter (HOSPITAL_COMMUNITY): Payer: Self-pay | Admitting: Radiology

## 2020-09-26 ENCOUNTER — Inpatient Hospital Stay (HOSPITAL_COMMUNITY): Payer: BC Managed Care – PPO

## 2020-09-26 DIAGNOSIS — E063 Autoimmune thyroiditis: Secondary | ICD-10-CM

## 2020-09-26 DIAGNOSIS — Z8673 Personal history of transient ischemic attack (TIA), and cerebral infarction without residual deficits: Secondary | ICD-10-CM

## 2020-09-26 DIAGNOSIS — I6389 Other cerebral infarction: Secondary | ICD-10-CM | POA: Diagnosis not present

## 2020-09-26 LAB — ECHOCARDIOGRAM COMPLETE
AR max vel: 2 cm2
AV Area VTI: 1.89 cm2
AV Area mean vel: 2.09 cm2
AV Mean grad: 5 mmHg
AV Peak grad: 9.7 mmHg
Ao pk vel: 1.56 m/s
Area-P 1/2: 2.22 cm2
Height: 65 in
MV VTI: 2 cm2
S' Lateral: 3.1 cm
Weight: 3121.71 oz

## 2020-09-26 LAB — MRSA PCR SCREENING: MRSA by PCR: NEGATIVE

## 2020-09-26 LAB — HEMOGLOBIN A1C
Hgb A1c MFr Bld: 5.4 % (ref 4.8–5.6)
Mean Plasma Glucose: 108.28 mg/dL

## 2020-09-26 LAB — LIPID PANEL
Cholesterol: 109 mg/dL (ref 0–200)
HDL: 40 mg/dL — ABNORMAL LOW (ref >40)
LDL Cholesterol: 23 mg/dL (ref 0–99)
Total CHOL/HDL Ratio: 2.7 RATIO
Triglycerides: 231 mg/dL — ABNORMAL HIGH (ref <150)
VLDL: 46 mg/dL — ABNORMAL HIGH (ref 0–40)

## 2020-09-26 IMAGING — MR MR MRA HEAD W/O CM
2 series · 46 of 48 positions shown · non-contrast
Comparison: Prior studies from [DATE]

CLINICAL DATA: Follow-up examination for acute stroke, previous MCA
branch occlusion, status post tPA and catheter directed
revascularization.

EXAM:
MRI HEAD WITHOUT CONTRAST
MRA HEAD WITHOUT CONTRAST
TECHNIQUE: Multiplanar, multiecho pulse sequences of the brain and surrounding
structures were obtained without intravenous contrast. Angiographic
images of the head were obtained using MRA technique without
contrast.

[Series 3: DWI · axial · 3.0mm · 1.09mm/px · z∈[-83,+59]mm · 45 of 104 slices shown]
[im 1/104]
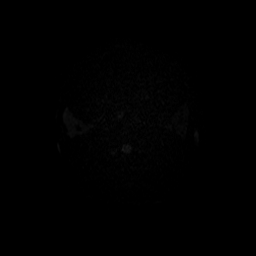
[im 3/104]
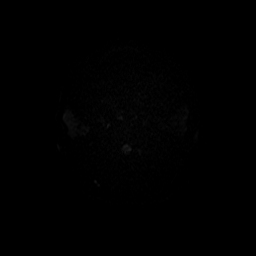
[im 5/104]
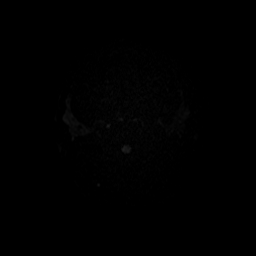
[im 7/104]
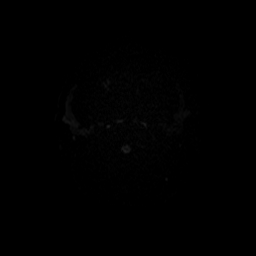
[im 9/104]
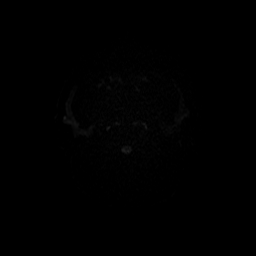
[im 12/104]
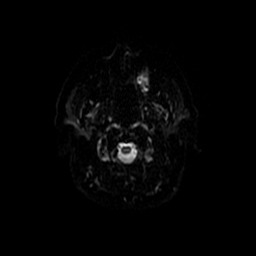
[im 14/104]
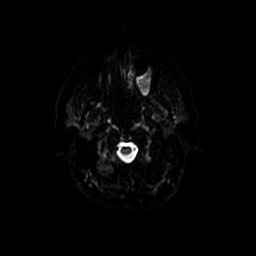
[im 16/104]
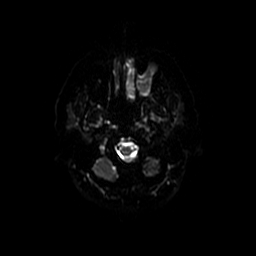
[im 18/104]
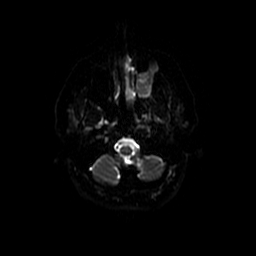
[im 21/104]
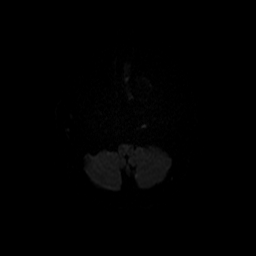
[im 23/104]
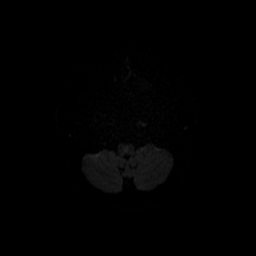
[im 25/104]
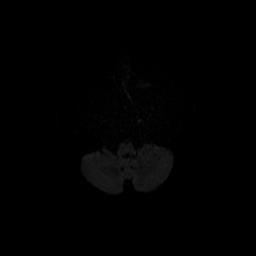
[im 27/104]
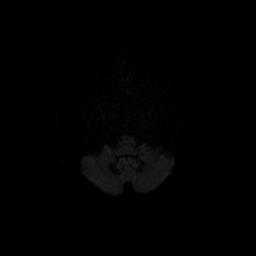
[im 30/104]
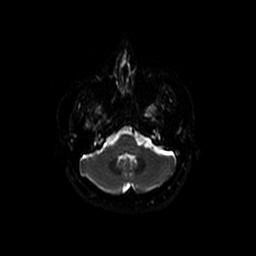
[im 32/104]
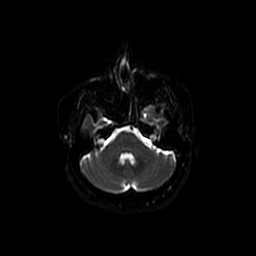
[im 34/104]
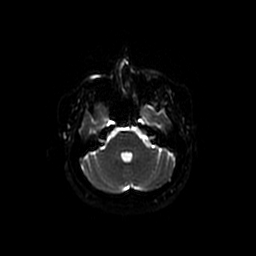
[im 36/104]
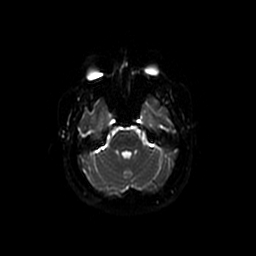
[im 39/104]
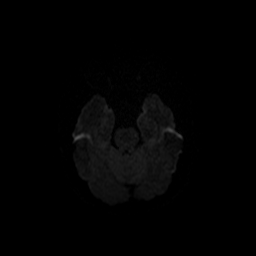
[im 41/104]
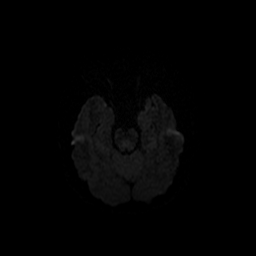
[im 43/104]
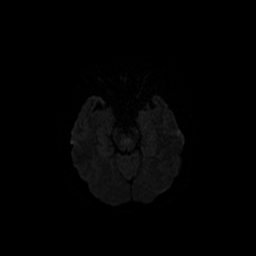
[im 45/104]
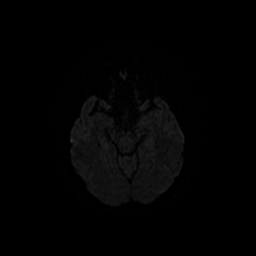
[im 48/104]
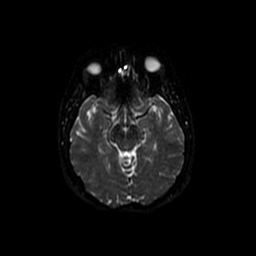
[im 50/104]
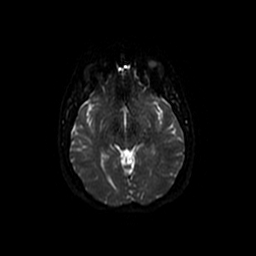
[im 52/104]
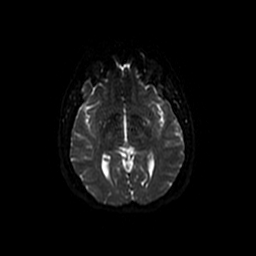
[im 54/104]
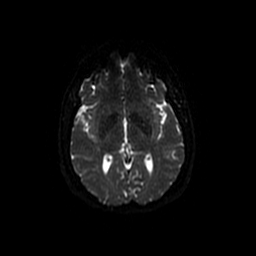
[im 56/104]
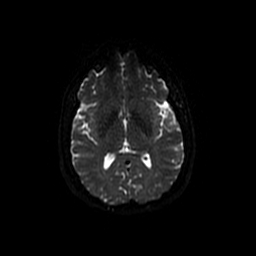
[im 59/104]
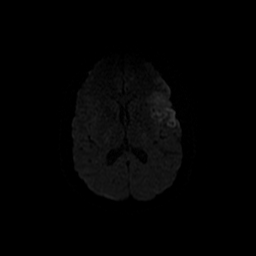
[im 61/104]
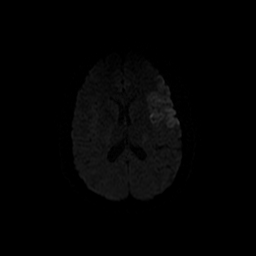
[im 63/104]
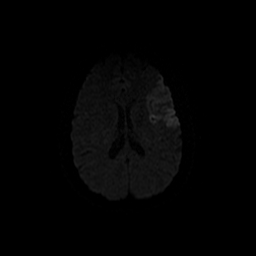
[im 65/104]
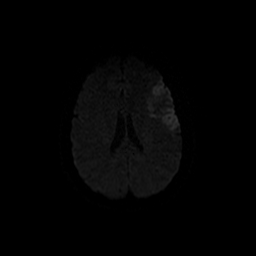
[im 68/104]
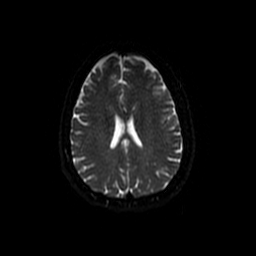
[im 70/104]
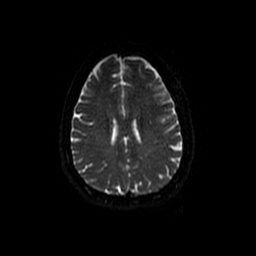
[im 72/104]
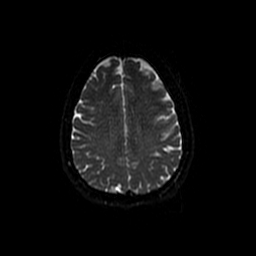
[im 74/104]
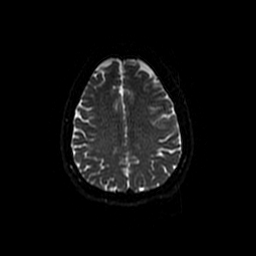
[im 77/104]
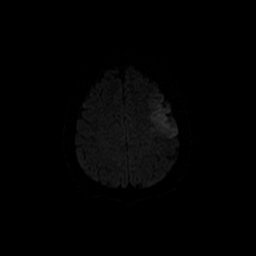
[im 79/104]
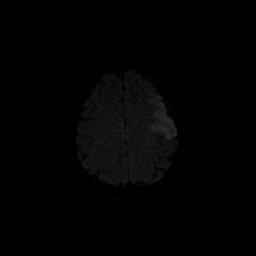
[im 81/104]
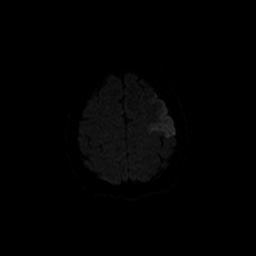
[im 83/104]
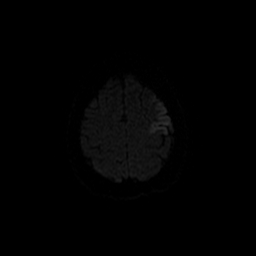
[im 86/104]
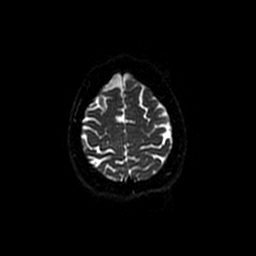
[im 88/104]
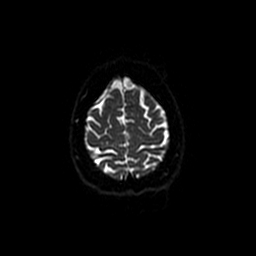
[im 90/104]
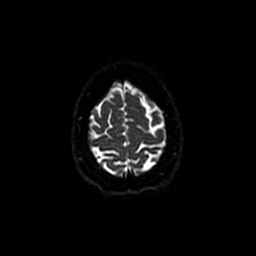
[im 92/104]
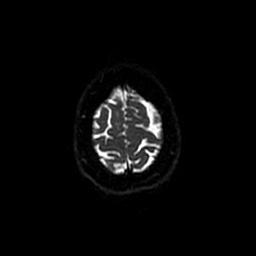
[im 95/104]
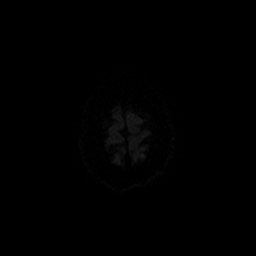
[im 97/104]
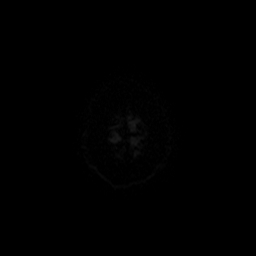
[im 99/104]
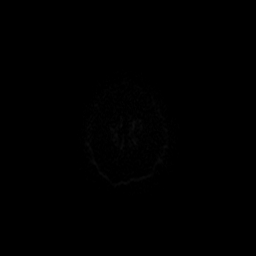

[Series 506: posterior tumble · axial · 1.4mm · 0.21mm/px · 1 of 1 slices shown]
[im 1/1]
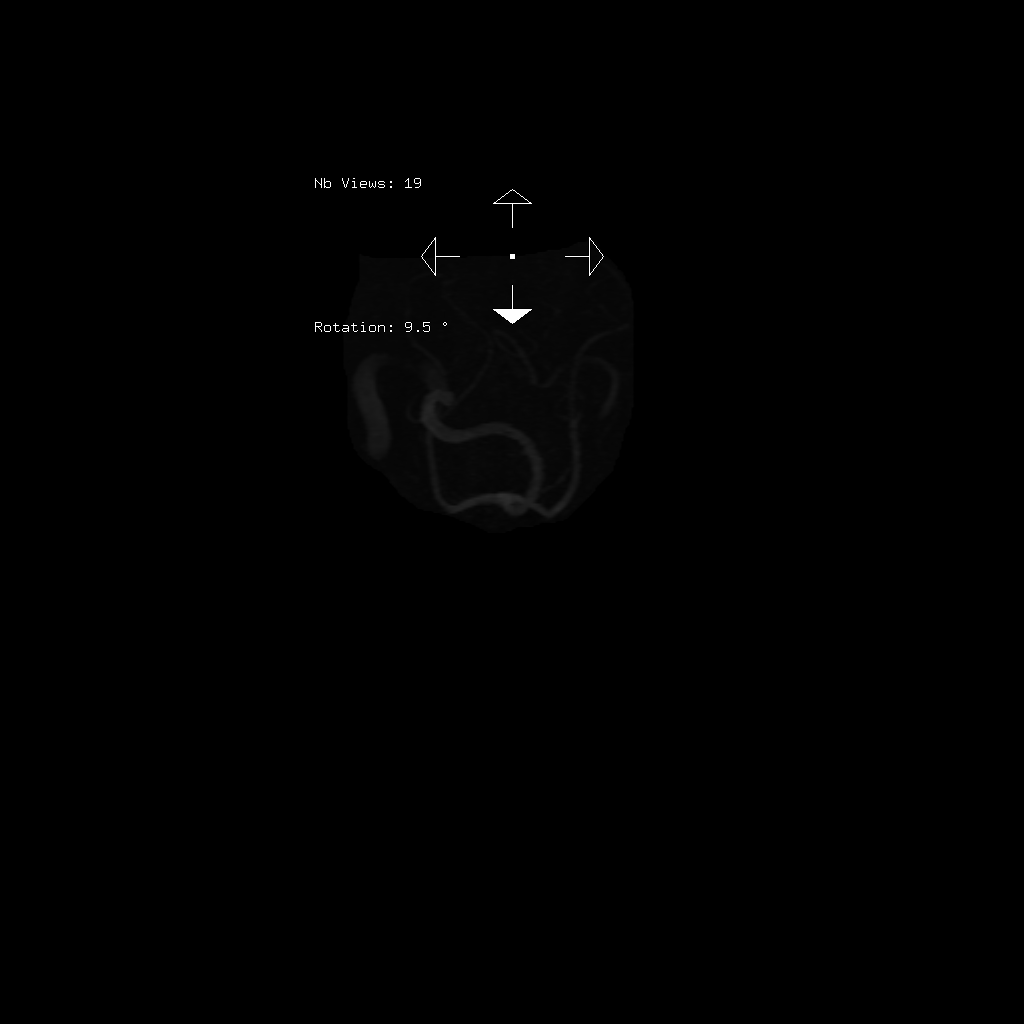

[46 of 48 positions shown; findings below may reference images not displayed]

FINDINGS: MRI HEAD FINDINGS

Brain: Cerebral volume within normal limits for age. Minimal
scattered T2/FLAIR hyperintensity noted within the periventricular
white matter, most likely related chronic microvascular ischemic
disease, minimal for age.

Area of confluent restricted diffusion seen involving the anterior
left frontal region with mild involvement of the underlying anterior
left insular cortex, consistent with acute left MCA distribution
infarct (series 3, image 32). Associated early gyral swelling and
edema within the area of infarction without significant regional
mass effect. Subcentimeter focus of susceptibility artifact at the
left frontal operculum/external capsule likely reflects a small
focus of petechial hemorrhage (series 8, image 13). No frank
malignant hemorrhagic transformation.

No other evidence for acute or subacute ischemia. Gray-white matter
differentiation otherwise maintained. No other areas of chronic
cortical infarction. No other evidence for acute or chronic
intracranial hemorrhage.

No mass lesion, midline shift or mass effect. No hydrocephalus or
extra-axial fluid collection. Pituitary gland suprasellar region
normal. Midline structures intact.

Vascular: Major intracranial vascular flow voids are well
maintained.

Skull and upper cervical spine: Craniocervical junction within
normal limits. Bone marrow signal intensity normal. No focal marrow
replacing lesion. No scalp soft tissue abnormality.

Sinuses/Orbits: Globes and orbital soft tissues within normal
limits. Left maxillary sinus retention cysts noted. Additional mild
mucosal thickening elsewhere within the paranasal sinuses. No
mastoid effusion. Inner ear structures within normal limits.

Other: None.

MRA HEAD FINDINGS

ANTERIOR CIRCULATION:

Visualized distal cervical segments of the internal carotid arteries
are patent with antegrade flow. Petrous, cavernous, and supraclinoid
segments widely patent without stenosis or other abnormality. A1
segments widely patent. Normal anterior communicating artery
complex. Anterior cerebral arteries widely patent to their distal
aspects. No M1 stenosis or occlusion. Normal MCA bifurcations.
Interval revascularization of previously identified left MCA branch
occlusion. MCA branches now well perfused and patent bilaterally.

POSTERIOR CIRCULATION:

Dominant right vertebral artery widely patent to the vertebrobasilar
junction. Left vertebral artery markedly hypoplastic and terminates
in PICA. Both PICA patent. Basilar patent to its distal aspect
without stenosis. Superior cerebellar arteries patent bilaterally.
Both PCAs primarily supplied via the basilar well perfused to their
distal aspects.

No intracranial aneurysm.
IMPRESSION: MRI HEAD IMPRESSION:

1. Acute ischemic left MCA distribution infarct as above.
Subcentimeter focus of susceptibility artifact consistent with a
small focus of petechial hemorrhage. No frank malignant hemorrhagic
transformation or significant mass effect.
2. Otherwise essentially normal brain MRI for age.

MRA HEAD IMPRESSION:

1. Interval revascularization of previously identified left MCA
branch occlusion, now patent.
2. Otherwise stable and normal intracranial MRA.

## 2020-09-26 IMAGING — MR MR HEAD W/O CM
8 of 10 series · 28 of 48 positions shown · non-contrast
Comparison: Prior studies from [DATE]

CLINICAL DATA: Follow-up examination for acute stroke, previous MCA
branch occlusion, status post tPA and catheter directed
revascularization.

EXAM:
MRI HEAD WITHOUT CONTRAST
MRA HEAD WITHOUT CONTRAST
TECHNIQUE: Multiplanar, multiecho pulse sequences of the brain and surrounding
structures were obtained without intravenous contrast. Angiographic
images of the head were obtained using MRA technique without
contrast.

[Series 4: DWI · coronal · 5.0mm · 1.09mm/px · 8 of 72 slices shown (1 of 3)]
[im 1/72]
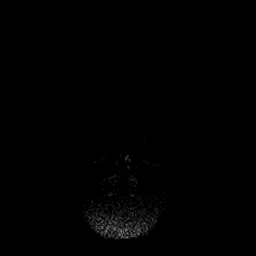
[im 11/72]
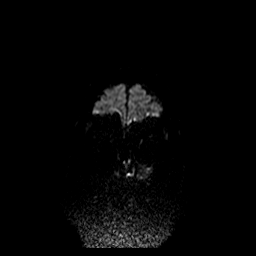
[im 21/72]
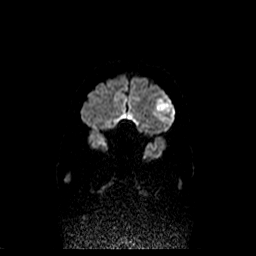
[im 31/72]
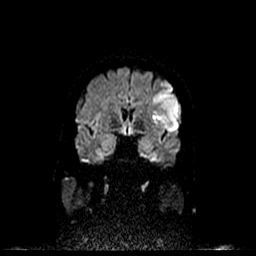
[im 41/72]
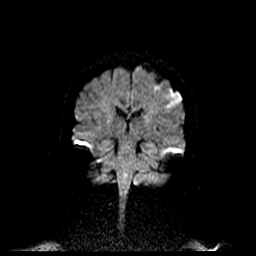
[im 51/72]
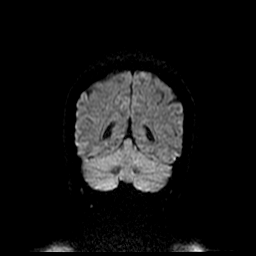
[im 61/72]
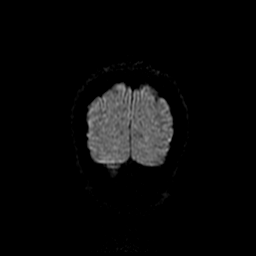
[im 72/72]
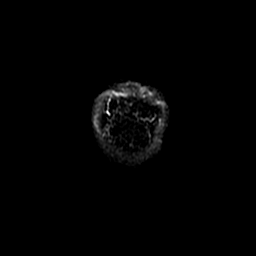

[Series 6: T1 · sagittal · 5.0mm · 0.47mm/px · 2 of 25 slices shown (1 of 2)]
[im 1/25]
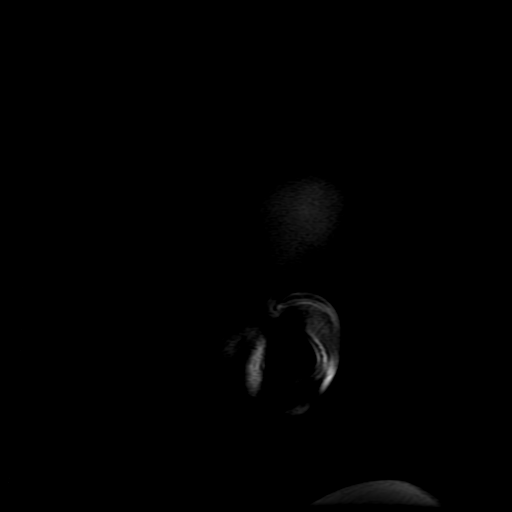
[im 25/25]
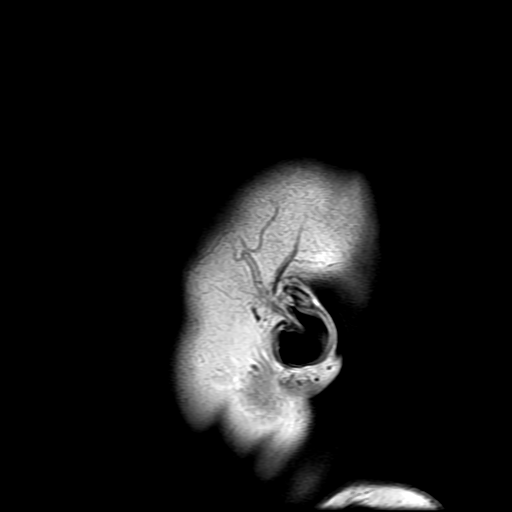

[Series 7: FLAIR · axial · 3.0mm · 0.43mm/px · z∈[-70,+69]mm · 2 of 25 slices shown]
[im 1/25]
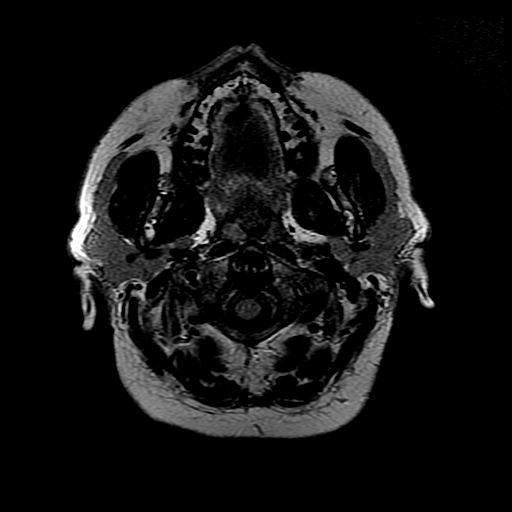
[im 25/25]
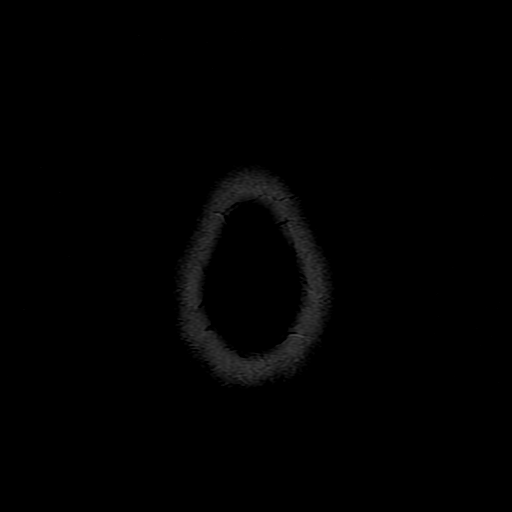

[Series 9: T2 · axial · 5.0mm · 0.43mm/px · z∈[-70,+69]mm · 2 of 25 slices shown (1 of 2)]
[im 1/25]
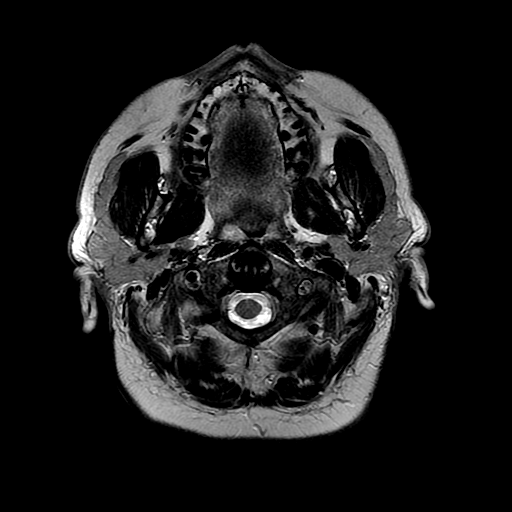
[im 25/25]
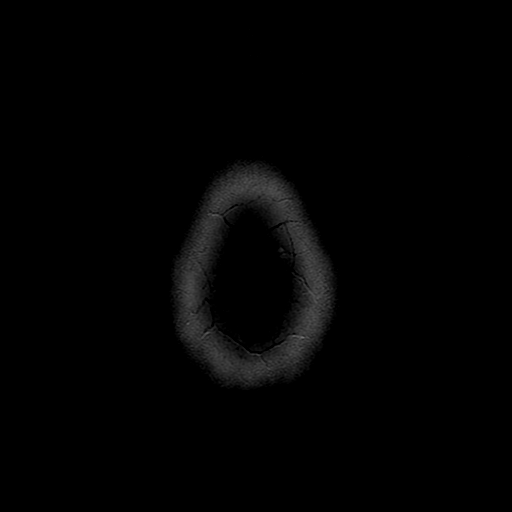

[Series 10: T1 · axial · 3.0mm · 0.47mm/px · z∈[-76,-40]mm · 3 of 104 slices shown (2 of 2)]
[im 1/104]
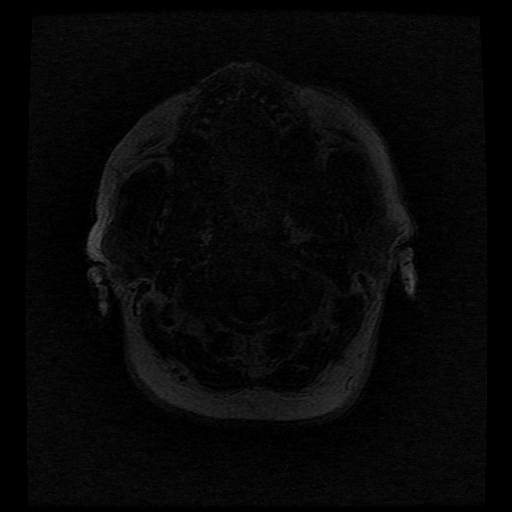
[im 13/104]
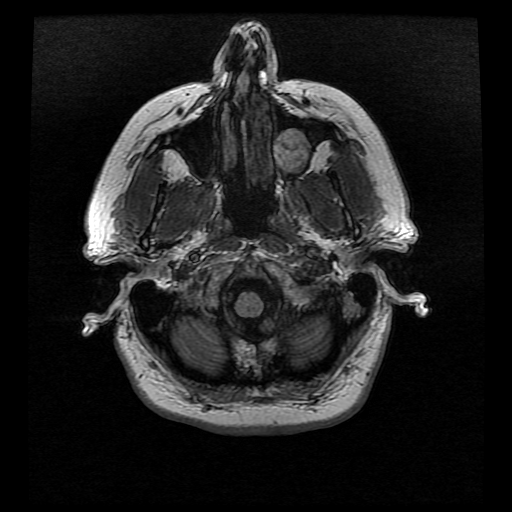
[im 26/104]
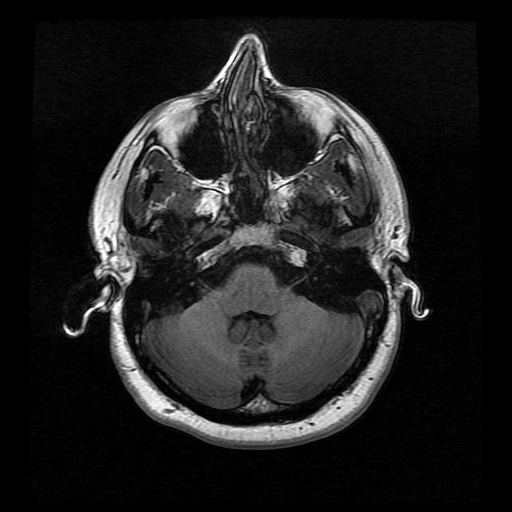

[Series 11: T2 · coronal · 5.0mm · 0.39mm/px · 3 of 29 slices shown (2 of 2)]
[im 1/29]
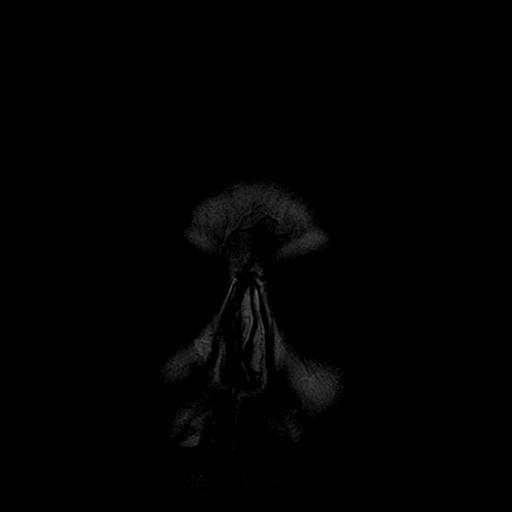
[im 15/29]
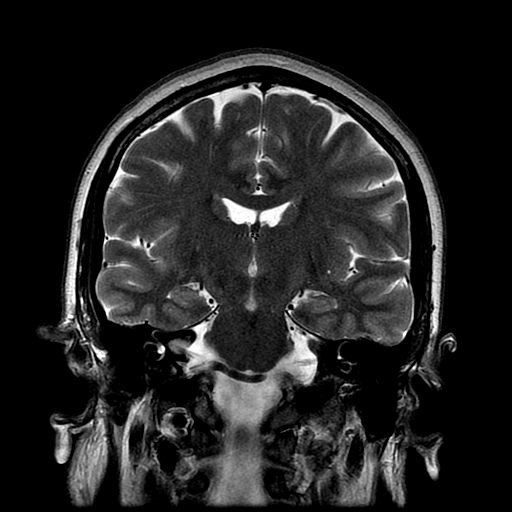
[im 29/29]
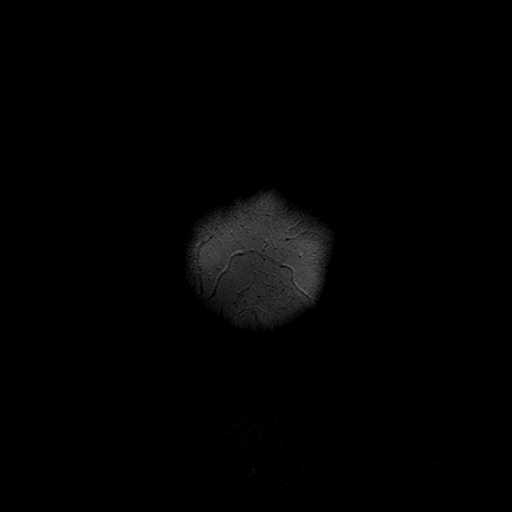

[Series 300: DWI · axial · 3.0mm · 1.09mm/px · z∈[-83,+65]mm · 5 of 52 slices shown (2 of 3)]
[im 1/52]
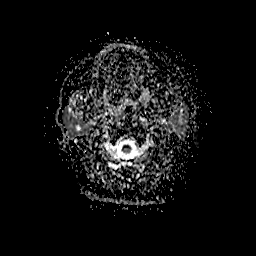
[im 13/52]
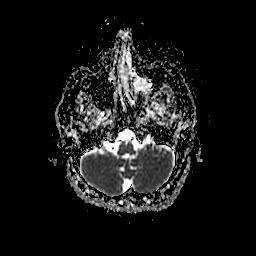
[im 26/52]
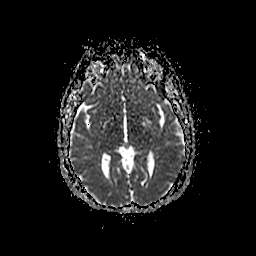
[im 39/52]
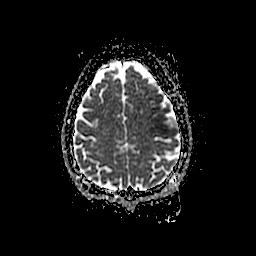
[im 52/52]
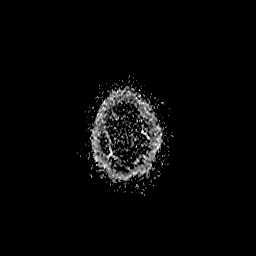

[Series 400: DWI · coronal · 5.0mm · 1.09mm/px · 3 of 36 slices shown (3 of 3)]
[im 1/36]
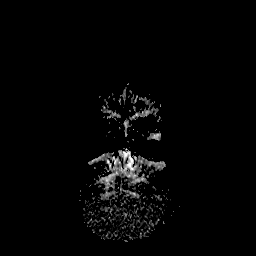
[im 18/36]
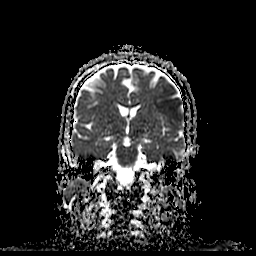
[im 36/36]
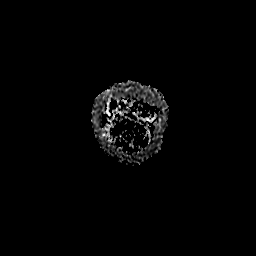

[28 of 48 positions shown; findings below may reference images not displayed]

FINDINGS: MRI HEAD FINDINGS

Brain: Cerebral volume within normal limits for age. Minimal
scattered T2/FLAIR hyperintensity noted within the periventricular
white matter, most likely related chronic microvascular ischemic
disease, minimal for age.

Area of confluent restricted diffusion seen involving the anterior
left frontal region with mild involvement of the underlying anterior
left insular cortex, consistent with acute left MCA distribution
infarct (series 3, image 32). Associated early gyral swelling and
edema within the area of infarction without significant regional
mass effect. Subcentimeter focus of susceptibility artifact at the
left frontal operculum/external capsule likely reflects a small
focus of petechial hemorrhage (series 8, image 13). No frank
malignant hemorrhagic transformation.

No other evidence for acute or subacute ischemia. Gray-white matter
differentiation otherwise maintained. No other areas of chronic
cortical infarction. No other evidence for acute or chronic
intracranial hemorrhage.

No mass lesion, midline shift or mass effect. No hydrocephalus or
extra-axial fluid collection. Pituitary gland suprasellar region
normal. Midline structures intact.

Vascular: Major intracranial vascular flow voids are well
maintained.

Skull and upper cervical spine: Craniocervical junction within
normal limits. Bone marrow signal intensity normal. No focal marrow
replacing lesion. No scalp soft tissue abnormality.

Sinuses/Orbits: Globes and orbital soft tissues within normal
limits. Left maxillary sinus retention cysts noted. Additional mild
mucosal thickening elsewhere within the paranasal sinuses. No
mastoid effusion. Inner ear structures within normal limits.

Other: None.

MRA HEAD FINDINGS

ANTERIOR CIRCULATION:

Visualized distal cervical segments of the internal carotid arteries
are patent with antegrade flow. Petrous, cavernous, and supraclinoid
segments widely patent without stenosis or other abnormality. A1
segments widely patent. Normal anterior communicating artery
complex. Anterior cerebral arteries widely patent to their distal
aspects. No M1 stenosis or occlusion. Normal MCA bifurcations.
Interval revascularization of previously identified left MCA branch
occlusion. MCA branches now well perfused and patent bilaterally.

POSTERIOR CIRCULATION:

Dominant right vertebral artery widely patent to the vertebrobasilar
junction. Left vertebral artery markedly hypoplastic and terminates
in PICA. Both PICA patent. Basilar patent to its distal aspect
without stenosis. Superior cerebellar arteries patent bilaterally.
Both PCAs primarily supplied via the basilar well perfused to their
distal aspects.

No intracranial aneurysm.
IMPRESSION: MRI HEAD IMPRESSION:

1. Acute ischemic left MCA distribution infarct as above.
Subcentimeter focus of susceptibility artifact consistent with a
small focus of petechial hemorrhage. No frank malignant hemorrhagic
transformation or significant mass effect.
2. Otherwise essentially normal brain MRI for age.

MRA HEAD IMPRESSION:

1. Interval revascularization of previously identified left MCA
branch occlusion, now patent.
2. Otherwise stable and normal intracranial MRA.

## 2020-09-26 MED ORDER — ATORVASTATIN CALCIUM 10 MG PO TABS
10.0000 mg | ORAL_TABLET | Freq: Every day | ORAL | Status: DC
  Administered 2020-09-26 – 2020-09-29 (×4): 10 mg via ORAL
  Filled 2020-09-26 (×4): qty 1

## 2020-09-26 MED ORDER — CHLORHEXIDINE GLUCONATE CLOTH 2 % EX PADS
6.0000 | MEDICATED_PAD | Freq: Every day | CUTANEOUS | Status: DC
  Administered 2020-09-26 – 2020-09-29 (×2): 6 via TOPICAL

## 2020-09-26 MED ORDER — PANTOPRAZOLE SODIUM 40 MG PO TBEC
40.0000 mg | DELAYED_RELEASE_TABLET | Freq: Every day | ORAL | Status: DC
  Administered 2020-09-26 – 2020-09-29 (×4): 40 mg via ORAL
  Filled 2020-09-26 (×4): qty 1

## 2020-09-26 MED ORDER — RESOURCE THICKENUP CLEAR PO POWD
Freq: Once | ORAL | Status: DC
  Filled 2020-09-26: qty 125

## 2020-09-26 NOTE — Evaluation (Signed)
Speech Language Pathology Evaluation Patient Details Name: [name] MRN: [id number] DOB: Apr 27, 1971 Today's Date: 09/26/2020 Time: 7510-2585 SLP Time Calculation (min) (ACUTE ONLY): 20 min  Problem List:  Patient Active Problem List   Diagnosis Date Noted   Acute ischemic stroke (HCC) 09/25/2020   History of ischemic left MCA stroke s/p tPA 06/06/2020   History of repair of congenital atrial septal defect (ASD) 06/06/2020   Hyperlipidemia LDL goal <70 06/06/2020   Received intravenous tissue plasminogen activator (tPA) in emergency department    Hypothyroid 12/22/2011   Stress incontinence 12/22/2011   Family history of kidney disease 12/22/2011   Routine gynecological examination 12/22/2011   Routine general medical examination at a health care facility 12/19/2011   Past Medical History:  Past Medical History:  Diagnosis Date   Hx of migraines    Stroke (HCC)    06/03/20   Past Surgical History:  Past Surgical History:  Procedure Laterality Date   BUBBLE STUDY  03/13/2020   Procedure: BUBBLE STUDY;  Surgeon: Quintella Reichert, MD;  Location: [facility] ENDOSCOPY;  Service: Cardiovascular;;   ENDOMETRIAL ABLATION     Uterine/heavy menses   IR ANGIO INTRA EXTRACRAN SEL COM CAROTID INNOMINATE BILAT MOD SED  06/06/2020   IR ANGIO VERTEBRAL SEL VERTEBRAL BILAT MOD SED  06/06/2020   IR US GUIDE VASC ACCESS RIGHT  06/06/2020   RADIOLOGY WITH ANESTHESIA N/A 09/25/2020   Procedure: RADIOLOGY WITH ANESTHESIA;  Surgeon: Radiologist, Medication, MD;  Location: [facility] OR;  Service: Radiology;  Laterality: N/A;   SHOULDER SURGERY     Left,/bone spurs   TEE WITHOUT CARDIOVERSION N/A 03/13/2020   Procedure: TRANSESOPHAGEAL ECHOCARDIOGRAM (TEE);  Surgeon: Quintella Reichert, MD;  Location: [facility] ENDOSCOPY;  Service: Cardiovascular;  Laterality: N/A;   HPI:  50 yo female presenting with R facial droop, R sided weakness, aphasia, and L gaze. Given IV TPA on 3/24. Noncontrast head CT negative  for bleed. S/p L revascularization of occluded L MCA. Intubated briefly for procedure 3/24. PMH including two CVAs (03/2020 and 05/2020-R parietal occipital and L frontal infarcts) and migraines.   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  Able to follow simple commands with limbs, accuracy decreases significantly with commands for oral-motor movements. Mild deficits with two-step commands. Yes/no reliability WFL. Pragmatics WFL. She demonstrates improving letter identification and was writing single words spontaneously with improving letter formation /accuracy. No spontaneous speech or automatic sequences.  Pt was able to produce single vowel sounds after verbal and visual model.  She could differentiate two vowels (ah and ooo) when cued and demonstrated emerging ability to shift between targets.  She was able to produce occasional C-V combinations when the initial consonant was salient (b, m).  D/W with pt, husband, and daughter the nature of apraxia and aphasia, the best way to help [name] right now, and the plan for speech therapy while she remains in acute care. They verbalized understanding.    SLP Assessment  SLP Recommendation/Assessment: Patient needs continued Speech Lanaguage Pathology Services SLP Visit Diagnosis: Cognitive communication deficit (R41.841)    Follow Up Recommendations  Inpatient Rehab    Frequency and Duration min 3x week  2 weeks      SLP Evaluation Cognition  Overall Cognitive Status: Impaired/Different from baseline Arousal/Alertness: Awake/alert Orientation Level: Oriented X4 Attention: Sustained Sustained Attention: Appears intact       Comprehension  Auditory Comprehension Overall Auditory Comprehension: Impaired  Yes/No Questions: Impaired Basic Biographical Questions: 76-100% accurate Complex Questions: 50-74% accurate Commands: Impaired One Step Basic  Commands: 75-100% accurate Two Step Basic Commands: 50-74% accurate Conversation: Simple Visual Recognition/Discrimination Discrimination: Within Function Limits Reading Comprehension Reading Status:  (identified 4/5 letters from alphabet board)    Expression Expression Primary Mode of Expression: Nonverbal - gestures Verbal Expression Overall Verbal Expression: Impaired Initiation: No impairment Automatic Speech:  (unable) Repetition: Impaired Naming: Not tested Pragmatics: No impairment Written Expression Dominant Hand: Left   Oral / Motor  Oral Motor/Sensory Function Overall Oral Motor/Sensory Function: Mild impairment Facial ROM: Reduced right;Suspected CN VII (facial) dysfunction Facial Symmetry: Abnormal symmetry right;Suspected CN VII (facial) dysfunction Motor Speech Overall Motor Speech: Impaired Articulation: Impaired Motor Planning: Impaired Level of Impairment: Word Motor Speech Errors: Aware;Groping for words   GO                    Carolan Shiver 09/26/2020, 6:20 PM  Tyeler Goedken L. Samson Frederic, MA CCC/SLP Acute Rehabilitation Services Office number [phone] Pager [phone]

## 2020-09-26 NOTE — Evaluation (Signed)
Clinical/Bedside Swallow Evaluation Patient Details  Name: [name] MRN: [id number] Date of Birth: 1971-06-24  Today's Date: 09/26/2020 Time: SLP Start Time (ACUTE ONLY): 1711 SLP Stop Time (ACUTE ONLY): 1728 SLP Time Calculation (min) (ACUTE ONLY): 17 min  Past Medical History:  Past Medical History:  Diagnosis Date  . Hx of migraines   . Stroke [facility])    06/03/20   Past Surgical History:  Past Surgical History:  Procedure Laterality Date  . BUBBLE STUDY  03/13/2020   Procedure: BUBBLE STUDY;  Surgeon: Quintella Reichert, MD;  Location: [facility] ENDOSCOPY;  Service: Cardiovascular;;  . ENDOMETRIAL ABLATION     Uterine/heavy menses  . IR ANGIO INTRA EXTRACRAN SEL COM CAROTID INNOMINATE BILAT MOD SED  06/06/2020  . IR ANGIO VERTEBRAL SEL VERTEBRAL BILAT MOD SED  06/06/2020  . IR US GUIDE VASC ACCESS RIGHT  06/06/2020  . RADIOLOGY WITH ANESTHESIA N/A 09/25/2020   Procedure: RADIOLOGY WITH ANESTHESIA;  Surgeon: Radiologist, Medication, MD;  Location: [facility] OR;  Service: Radiology;  Laterality: N/A;  . SHOULDER SURGERY     Left,/bone spurs  . TEE WITHOUT CARDIOVERSION N/A 03/13/2020   Procedure: TRANSESOPHAGEAL ECHOCARDIOGRAM (TEE);  Surgeon: Quintella Reichert, MD;  Location: [facility] ENDOSCOPY;  Service: Cardiovascular;  Laterality: N/A;   HPI:  50 yo female presenting with R facial droop, R sided weakness, aphasia, and L gaze. Given IV TPA on 3/24. Noncontrast head CT negative for bleed. S/p L revascularization of occluded L MCA. Intubated briefly for procedure 3/24. PMH including two CVAs (03/2020 and 05/2020-R parietal occipital and L frontal infarcts) and migraines.   Assessment / Plan / Recommendation Clinical Impression  Pt presents with an oropharyngeal dysphagia - there is mild right CNVII involvement with an oral apraxia as well.  Pt has difficulty masticating regular solids, with slowed movements and discoordination.  Thin liquids elicited intermittent coughing after three oz water challenge.  Purees and nectar thick liquids were tolerated well with better oral control and no s/s of aspiration. After discussion with pt and family, agreed to begin with a dysphagia 1/pureed diet, nectar thick liquids; meds whole with puree or nectars. Family may bring in smoothies from outside the facility. D/W RN, pt and family. SLP will follow for safety and diet progression. SLP Visit Diagnosis: Dysphagia, oropharyngeal phase (R13.12)           Diet Recommendation   dysphagia 1/nectar thick liquids  Medication Administration: Whole meds with puree    Other  Recommendations Oral Care Recommendations: Oral care BID Other Recommendations: Order thickener from pharmacy   Follow up Recommendations Other (comment) (tba)      Frequency and Duration min 3x week  2 weeks       Prognosis Prognosis for Safe Diet Advancement: Good      Swallow Study   General Date of Onset: 09/25/20 HPI: 50 yo female presenting with R facial droop, R sided weakness, aphasia, and L gaze. Given IV TPA on 3/24. Noncontrast head CT negative for bleed. S/p L revascularization of occluded L MCA. Intubated briefly for procedure 3/24. PMH including two CVAs (03/2020 and 05/2020-R parietal occipital and L frontal infarcts) and migraines. Type of Study: Bedside Swallow Evaluation Previous Swallow Assessment: no Diet Prior to this Study: NPO Temperature Spikes Noted: No Respiratory Status: Room air History of Recent Intubation:  (briefly  in IR) Behavior/Cognition: Alert;Cooperative Oral Cavity Assessment: Within Functional Limits Oral Care Completed by SLP: Recent completion by staff Oral Cavity - Dentition: Adequate natural dentition Vision:  Functional for self-feeding Self-Feeding Abilities: Able to feed self Patient Positioning: Upright in bed Baseline Vocal Quality: Low vocal intensity Volitional Cough: Cognitively unable to elicit Volitional Swallow: Unable to elicit    Oral/Motor/Sensory Function Overall Oral  Motor/Sensory Function: Mild impairment Facial ROM: Reduced right;Suspected CN VII (facial) dysfunction Facial Symmetry: Abnormal symmetry right;Suspected CN VII (facial) dysfunction   Ice Chips Ice chips: Within functional limits Presentation: Spoon   Thin Liquid Thin Liquid: Impaired Presentation: Straw Pharyngeal  Phase Impairments: Cough - Delayed    Nectar Thick Nectar Thick Liquid: Within functional limits Presentation: Cup   Honey Thick Honey Thick Liquid: Not tested   Puree Puree: Impaired Presentation: Spoon Oral Phase Impairments: Reduced lingual movement/coordination   Solid     Solid: Impaired Presentation: Self Fed Oral Phase Impairments: Impaired mastication Oral Phase Functional Implications: Prolonged oral transit      Blenda Mounts [name] 09/26/2020,6:08 PM   Marchelle Folks L. Samson Frederic, MA CCC/SLP Acute Rehabilitation Services Office number [phone] Pager [phone]

## 2020-09-26 NOTE — Progress Notes (Signed)
STROKE TEAM PROGRESS NOTE   SUBJECTIVE (INTERVAL HISTORY) Her husband and daughter are at the bedside.  Overall her condition is gradually improving.  Still has expressive aphasia, however follow simple commands, moving all extremities.  Symptoms much improved from last night, status post TPA and thrombectomy.  Pending MRI.   OBJECTIVE Temp:  [98.1 F (36.7 C)-99 F (37.2 C)] 98.8 F (37.1 C) (03/25 1600) Pulse Rate:  [70-97] 73 (03/25 1817) Resp:  [9-27] 20 (03/25 1817) BP: (103-131)/(55-94) 110/69 (03/25 0900) SpO2:  [91 %-99 %] 98 % (03/25 1817) Arterial Line BP: (84-151)/(51-71) 144/59 (03/25 1817) Weight:  [88.5 kg] 88.5 kg (03/24 2139)  Recent Labs  Lab 09/25/20 2112  GLUCAP 116*   Recent Labs  Lab 09/25/20 2118  NA 136  139  K 3.3*  3.3*  CL 104  104  CO2 22  GLUCOSE 121*  120*  BUN 16  19  CREATININE 0.71  0.70  CALCIUM 8.7*   Recent Labs  Lab 09/25/20 2118  AST 25  ALT 24  ALKPHOS 69  BILITOT 0.5  PROT 6.8  ALBUMIN 3.7   Recent Labs  Lab 09/25/20 2118  WBC 8.8  NEUTROABS 4.9  HGB 13.1  12.6  HCT 38.9  37.0  MCV 94.9  PLT 220   No results for input(s): CKTOTAL, CKMB, CKMBINDEX, TROPONINI in the last 168 hours. Recent Labs    09/25/20 2118  LABPROT 13.0  INR 1.0   No results for input(s): COLORURINE, LABSPEC, PHURINE, GLUCOSEU, HGBUR, BILIRUBINUR, KETONESUR, PROTEINUR, UROBILINOGEN, NITRITE, LEUKOCYTESUR in the last 72 hours.  Invalid input(s): APPERANCEUR     Component Value Date/Time   CHOL 109 09/26/2020 0317   CHOL 160 05/20/2020 0909   TRIG 231 (H) 09/26/2020 0317   HDL 40 (L) 09/26/2020 0317   HDL 56 05/20/2020 0909   CHOLHDL 2.7 09/26/2020 0317   VLDL 46 (H) 09/26/2020 0317   LDLCALC 23 09/26/2020 0317   LDLCALC 80 05/20/2020 0909   Lab Results  Component Value Date   HGBA1C 5.4 09/26/2020      Component Value Date/Time   LABOPIA NONE DETECTED 03/12/2020 1226   COCAINSCRNUR NONE DETECTED 03/12/2020 1226    LABBENZ NONE DETECTED 03/12/2020 1226   AMPHETMU NONE DETECTED 03/12/2020 1226   THCU NONE DETECTED 03/12/2020 1226   LABBARB NONE DETECTED 03/12/2020 1226    No results for input(s): ETH in the last 168 hours.  I have personally reviewed the radiological images below and agree with the radiology interpretations.  CT ANGIO HEAD W OR WO CONTRAST  Result Date: 09/25/2020 CLINICAL DATA:  Initial evaluation for acute stroke, right-sided facial droop, right arm drift. EXAM: CT ANGIOGRAPHY HEAD AND NECK TECHNIQUE: Multidetector CT imaging of the head and neck was performed using the standard protocol during bolus administration of intravenous contrast. Multiplanar CT image reconstructions and MIPs were obtained to evaluate the vascular anatomy. Carotid stenosis measurements (when applicable) are obtained utilizing NASCET criteria, using the distal internal carotid diameter as the denominator. CONTRAST:  75mL OMNIPAQUE IOHEXOL 350 MG/ML SOLN COMPARISON:  Prior CT from earlier the same day as well as multiple previous exams. FINDINGS: CTA NECK FINDINGS Aortic arch: Visualized aortic arch normal in caliber with normal branch pattern. No stenosis about the origin of the great vessels. Right carotid system: Right common and internal carotid arteries widely patent without stenosis, dissection or occlusion. Left carotid system: Left common and internal carotid arteries widely patent without stenosis, dissection or occlusion. Vertebral arteries: Both vertebral  arteries arise from the subclavian arteries. Strongly dominant right vertebral artery with a diffusely hypoplastic left vertebral artery. Vertebral arteries widely patent within the neck without stenosis, dissection or occlusion. Skeleton: No visible acute osseous finding. No discrete or worrisome osseous lesions. Mild to moderate cervical spondylosis at C5-6 and C6-7. Other neck: No other acute soft tissue abnormality within the neck. No mass or adenopathy.  Upper chest: Visualized upper chest demonstrates no acute finding. Surgical clips noted about the anterior mediastinum. Review of the MIP images confirms the above findings CTA HEAD FINDINGS Anterior circulation: Both internal carotid arteries widely patent to the termini without stenosis. A1 segments widely patent. Normal anterior communicating artery complex. Anterior cerebral arteries widely patent to their distal aspects. No M1 stenosis or occlusion. Normal MCA bifurcations. On the left, there is acute occlusion of a left M3 branch, superior division (series 7, image 95). Remainder of the left MCA branches are perfused. Distal right MCA branches well perfused as well. Posterior circulation: Dominant right V4 segment widely patent to the vertebrobasilar junction. Hypoplastic left vertebral artery terminates in PICA. Both PICA origins patent and normal. Basilar patent to its distal aspect without stenosis. Superior cerebellar arteries patent bilaterally. Both PCAs primarily supplied via the basilar well perfused to their distal aspects. Venous sinuses: Patent. Anatomic variants: Hypoplastic left vertebral artery terminates in PICA. No aneurysm. Review of the MIP images confirms the above findings IMPRESSION: 1. Acute left M3 branch occlusion, superior division. 2. Otherwise negative CTA of the head and neck. No other large vessel occlusion, hemodynamically significant stenosis, or other acute vascular abnormality. Critical Value/emergent results were discussed by telephone at the time of interpretation on 09/25/2020 at 9:25 pm with provider ASHISH ARORA. Electronically Signed   By: Rise Mu M.D.   On: 09/25/2020 21:45   CT ANGIO NECK W OR WO CONTRAST  Result Date: 09/25/2020 CLINICAL DATA:  Initial evaluation for acute stroke, right-sided facial droop, right arm drift. EXAM: CT ANGIOGRAPHY HEAD AND NECK TECHNIQUE: Multidetector CT imaging of the head and neck was performed using the standard  protocol during bolus administration of intravenous contrast. Multiplanar CT image reconstructions and MIPs were obtained to evaluate the vascular anatomy. Carotid stenosis measurements (when applicable) are obtained utilizing NASCET criteria, using the distal internal carotid diameter as the denominator. CONTRAST:  75mL OMNIPAQUE IOHEXOL 350 MG/ML SOLN COMPARISON:  Prior CT from earlier the same day as well as multiple previous exams. FINDINGS: CTA NECK FINDINGS Aortic arch: Visualized aortic arch normal in caliber with normal branch pattern. No stenosis about the origin of the great vessels. Right carotid system: Right common and internal carotid arteries widely patent without stenosis, dissection or occlusion. Left carotid system: Left common and internal carotid arteries widely patent without stenosis, dissection or occlusion. Vertebral arteries: Both vertebral arteries arise from the subclavian arteries. Strongly dominant right vertebral artery with a diffusely hypoplastic left vertebral artery. Vertebral arteries widely patent within the neck without stenosis, dissection or occlusion. Skeleton: No visible acute osseous finding. No discrete or worrisome osseous lesions. Mild to moderate cervical spondylosis at C5-6 and C6-7. Other neck: No other acute soft tissue abnormality within the neck. No mass or adenopathy. Upper chest: Visualized upper chest demonstrates no acute finding. Surgical clips noted about the anterior mediastinum. Review of the MIP images confirms the above findings CTA HEAD FINDINGS Anterior circulation: Both internal carotid arteries widely patent to the termini without stenosis. A1 segments widely patent. Normal anterior communicating artery complex. Anterior cerebral arteries widely patent  to their distal aspects. No M1 stenosis or occlusion. Normal MCA bifurcations. On the left, there is acute occlusion of a left M3 branch, superior division (series 7, image 95). Remainder of the left MCA  branches are perfused. Distal right MCA branches well perfused as well. Posterior circulation: Dominant right V4 segment widely patent to the vertebrobasilar junction. Hypoplastic left vertebral artery terminates in PICA. Both PICA origins patent and normal. Basilar patent to its distal aspect without stenosis. Superior cerebellar arteries patent bilaterally. Both PCAs primarily supplied via the basilar well perfused to their distal aspects. Venous sinuses: Patent. Anatomic variants: Hypoplastic left vertebral artery terminates in PICA. No aneurysm. Review of the MIP images confirms the above findings IMPRESSION: 1. Acute left M3 branch occlusion, superior division. 2. Otherwise negative CTA of the head and neck. No other large vessel occlusion, hemodynamically significant stenosis, or other acute vascular abnormality. Critical Value/emergent results were discussed by telephone at the time of interpretation on 09/25/2020 at 9:25 pm with provider ASHISH ARORA. Electronically Signed   By: Rise Mu M.D.   On: 09/25/2020 21:45   ECHOCARDIOGRAM COMPLETE  Result Date: 09/26/2020    ECHOCARDIOGRAM REPORT   Patient Name:   [name] Date of Exam: 09/26/2020 Medical Rec #:  [id number]      Height:       65.0 in Accession #:    [id number]     Weight:       195.1 lb Date of Birth:  January 10, 1971      BSA:          1.957 m Patient Age:    49 years       BP:           110/62 mmHg Patient Gender: F              HR:           74 bpm. Exam Location:  Inpatient Procedure: 2D Echo, Cardiac Doppler and Color Doppler Indications:    Stroke  History:        Patient has prior history of Echocardiogram examinations, most                 recent 06/03/2020. Risk Factors:Former Smoker. H/O PFO closure                 30 years ago.  Sonographer:    Ross Ludwig RDCS (AE) Referring Phys: [id number] ASHISH ARORA IMPRESSIONS  1. Left ventricular ejection fraction, by estimation, is 60 to 65%. The left ventricle has normal function. The  left ventricle has no regional wall motion abnormalities. Left ventricular diastolic parameters were normal.  2. Right ventricular systolic function is normal. The right ventricular size is normal. There is normal pulmonary artery systolic pressure.  3. The atrial septum appears intact, without residual shunt, without a visible closure device (surgical closure?). It tends to bow right to left, but there are no other signs of high right atrial pressure.  4. The mitral valve is normal in structure. No evidence of mitral valve regurgitation. No evidence of mitral stenosis.  5. The aortic valve is normal in structure. Aortic valve regurgitation is not visualized. No aortic stenosis is present.  6. The inferior vena cava is normal in size with greater than 50% respiratory variability, suggesting right atrial pressure of 3 mmHg. Comparison(s): A prior study was performed on 03/13/2020. The small saline contrast shunt seen on TEE was not demonstrated on the current study. FINDINGS  Left Ventricle:  Left ventricular ejection fraction, by estimation, is 60 to 65%. The left ventricle has normal function. The left ventricle has no regional wall motion abnormalities. The left ventricular internal cavity size was normal in size. There is  no left ventricular hypertrophy. Left ventricular diastolic parameters were normal. Indeterminate filling pressures. Right Ventricle: The right ventricular size is normal. No increase in right ventricular wall thickness. Right ventricular systolic function is normal. There is normal pulmonary artery systolic pressure. The tricuspid regurgitant velocity is 2.37 m/s, and  with an assumed right atrial pressure of 8 mmHg, the estimated right ventricular systolic pressure is 30.5 mmHg. Left Atrium: Left atrial size was normal in size. Right Atrium: Right atrial size was normal in size. Pericardium: There is no evidence of pericardial effusion. Mitral Valve: The mitral valve is normal in structure. No  evidence of mitral valve regurgitation. No evidence of mitral valve stenosis. MV peak gradient, 3.1 mmHg. The mean mitral valve gradient is 1.0 mmHg. Tricuspid Valve: The tricuspid valve is normal in structure. Tricuspid valve regurgitation is not demonstrated. No evidence of tricuspid stenosis. Aortic Valve: The aortic valve is normal in structure. Aortic valve regurgitation is not visualized. No aortic stenosis is present. Aortic valve mean gradient measures 5.0 mmHg. Aortic valve peak gradient measures 9.7 mmHg. Aortic valve area, by VTI measures 1.89 cm. Pulmonic Valve: The pulmonic valve was normal in structure. Pulmonic valve regurgitation is not visualized. No evidence of pulmonic stenosis. Aorta: The aortic root is normal in size and structure. Venous: The inferior vena cava is normal in size with greater than 50% respiratory variability, suggesting right atrial pressure of 3 mmHg. IAS/Shunts: No atrial level shunt detected by color flow Doppler.  LEFT VENTRICLE PLAX 2D LVIDd:         4.20 cm  Diastology LVIDs:         3.10 cm  LV e' medial:    5.63 cm/s LV PW:         1.00 cm  LV E/e' medial:  16.2 LV IVS:        1.00 cm  LV e' lateral:   8.25 cm/s LVOT diam:     1.90 cm  LV E/e' lateral: 11.1 LV SV:         61 LV SV Index:   31 LVOT Area:     2.84 cm  RIGHT VENTRICLE             IVC RV Basal diam:  2.60 cm     IVC diam: 1.60 cm RV S prime:     10.10 cm/s TAPSE (M-mode): 2.5 cm LEFT ATRIUM             Index       RIGHT ATRIUM           Index LA diam:        2.60 cm 1.33 cm/m  RA Area:     13.10 cm LA Vol (A2C):   47.0 ml 24.01 ml/m RA Volume:   26.20 ml  13.38 ml/m LA Vol (A4C):   44.3 ml 22.63 ml/m LA Biplane Vol: 48.5 ml 24.78 ml/m  AORTIC VALVE AV Area (Vmax):    2.00 cm AV Area (Vmean):   2.09 cm AV Area (VTI):     1.89 cm AV Vmax:           156.00 cm/s AV Vmean:          95.700 cm/s AV VTI:  0.323 m AV Peak Grad:      9.7 mmHg AV Mean Grad:      5.0 mmHg LVOT Vmax:         110.00  cm/s LVOT Vmean:        70.600 cm/s LVOT VTI:          0.215 m LVOT/AV VTI ratio: 0.67  AORTA Ao Root diam: 3.00 cm Ao Asc diam:  2.80 cm MITRAL VALVE               TRICUSPID VALVE MV Area (PHT): 2.22 cm    TR Peak grad:   22.5 mmHg MV Area VTI:   2.00 cm    TR Vmax:        237.00 cm/s MV Peak grad:  3.1 mmHg MV Mean grad:  1.0 mmHg    SHUNTS MV Vmax:       0.87 m/s    Systemic VTI:  0.22 m MV Vmean:      51.4 cm/s   Systemic Diam: 1.90 cm MV Decel Time: 342 msec MV E velocity: 91.30 cm/s MV A velocity: 74.60 cm/s MV E/A ratio:  1.22 Mihai Croitoru MD Electronically signed by Thurmon Fair MD Signature Date/Time: 09/26/2020/1:53:17 PM    Final    CUP PACEART REMOTE DEVICE CHECK  Result Date: 08/28/2020 ILR summary report received. Battery status OK. Normal device function. No new symptom, tachy, brady, or pause episodes. No new AF episodes. Monthly summary reports and ROV/PRN Hassell Halim, RN, CCDS, [employer]  CT HEAD CODE STROKE WO CONTRAST  Result Date: 09/25/2020 CLINICAL DATA:  Code stroke. Initial evaluation for acute right-sided facial droop, right arm drift. EXAM: CT HEAD WITHOUT CONTRAST TECHNIQUE: Contiguous axial images were obtained from the base of the skull through the vertex without intravenous contrast. COMPARISON:  Prior MRI from 06/04/2020. FINDINGS: Brain: Cerebral volume within normal limits. No acute intracranial hemorrhage. No acute large vessel territory infarct. No mass lesion, midline shift or mass effect. No hydrocephalus or extra-axial fluid collection. Vascular: No hyperdense vessel. Skull: Scalp soft tissues and calvarium within normal limits. Sinuses/Orbits: Left gaze noted. Globes and orbital soft tissues otherwise unremarkable. Left maxillary sinus retention cyst. Additional scattered mucosal thickening noted within the ethmoidal air cells and maxillary sinuses. Paranasal sinuses are otherwise clear. No mastoid effusion. Other: None. ASPECTS [facility] Stroke Program  Early CT Score) - Ganglionic level infarction (caudate, lentiform nuclei, internal capsule, insula, M1-M3 cortex): 7 - Supraganglionic infarction (M4-M6 cortex): 3 Total score (0-10 with 10 being normal): 10 IMPRESSION: 1. Negative head CT.  No acute intracranial abnormality. 2. ASPECTS is 10. These results were communicated to Dr. Wilford Corner at 9:25 pmon 3/24/2022by text page via the [facility] messaging system. Electronically Signed   By: Rise Mu M.D.   On: 09/25/2020 21:30    PHYSICAL EXAM  Temp:  [98.1 F (36.7 C)-99 F (37.2 C)] 98.8 F (37.1 C) (03/25 1600) Pulse Rate:  [70-97] 73 (03/25 1817) Resp:  [9-27] 20 (03/25 1817) BP: (103-131)/(55-94) 110/69 (03/25 0900) SpO2:  [91 %-99 %] 98 % (03/25 1817) Arterial Line BP: (84-151)/(51-71) 144/59 (03/25 1817) Weight:  [88.5 kg] 88.5 kg (03/24 2139)  General - Well nourished, well developed, in no apparent distress.  Ophthalmologic - fundi not visualized due to noncooperation.  Cardiovascular - Regular rhythm and rate.  Mental Status -  Awake alert, able to follow simple commands.  However, expressive aphasia, not able to name or repeat.  Cranial Nerves II - XII -  II - Visual field intact OU. III, IV, VI - Extraocular movements intact. V - Facial sensation intact bilaterally. VII - slight right facial nasolabial fold flattening VIII - Hearing & vestibular intact bilaterally. X - Palate elevates symmetrically. XI - Chin turning & shoulder shrug intact bilaterally. XII - Tongue protrusion intact.  Motor Strength - The patient's strength was normal in all extremities and pronator drift was absent.  Bulk was normal and fasciculations were absent.   Motor Tone - Muscle tone was assessed at the neck and appendages and was normal.  Reflexes - The patient's reflexes were symmetrical in all extremities and she had no pathological reflexes.  Sensory - Light touch, temperature/pinprick were assessed and were symmetrical.     Coordination - The patient had normal movements in the hands with no ataxia or dysmetria.  Tremor was absent.  Gait and Station - deferred.   ASSESSMENT/PLAN Ms. [name] is a 50 y.o. 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 TPA given.  Status post thrombectomy with left MCA TICI2c.  Stroke:  left MCA infarct embolic secondary to unclear source  Resultant expressive aphasia  CT head no acute abnormality  CTA of the neck left M2/M3 occlusion  IR left M2 occlusion status post TICI 2c reperfusion  MRI pending  MRA pending  2D Echo EF 60 to 65%, atrial septum no residual shunt  Loop recorder interrogation pending  LDL 23, TG 231  HgbA1c 5.4  SCDs for VTE prophylaxis  clopidogrel 75 mg daily prior to admission, now on No antithrombotic within 24 hours TPA.  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 DAPT ->aspirin only 05/20/2020.CT head and neck negative. EF 50 to 55%. TCD bubble study negative for PFO and LE venous Doppler negative for DVT. Hypercoagulable and autoimmune work-up negative.  Initial TEE concerning for possible IA shunt (previously closed ASD).However, on review from Dr. Excell Seltzerooper, no significant IA shunt  Cardiac CT 11/19 no CAD or PFO orIA shunt  loop placed 05/20/2020  06/2020 small right frontal lobe infarct, status post TPA, unclear etiology, consider recurrent cryptogenic stroke.  CT no acute abnormality, MRI small right frontal precentral  gyrus infarct.  MRA head and neck no LVO.  EF 60 to 65%.  Loop recorder interrogation no A. fib.  Patient declined LP.  Cerebral angiogram negative for vasculitis.  LDL 64, A1c 5.3.  Discharged with DAPT aspirin 81 and Plavix 75.  History of ASD  [facility] [place]   small IA shunt seen during TEE 03/2020, but not significantby Cooper   Cardiac CT 05/2020 noIA shunt  Hyperlipidemia  Home meds:lipitor 20  LDL 23, goal < 70  Decrease Lipitor to 10 mg daily given low LDL  Continue statin at discharge  Hashimoto'sthyroiditis  Diagnosed 17 years ago  03/2020, TSH6.13 (H), but FT4 normal, Thyroid peroxidase Ab55 (H), Thyroglobulin Ab- 1.5 (H)  06/2020 TSHnormaland free T4normal  Other Stroke Risk Factors  FormerCigarette smoker  MildETOH use(1/wk), advised to drink no more than 1 drink(s) a day  Obesity,Body mass index  is 32.47 kg/m., BMI >/= 30 associated with increased stroke risk, recommend weight loss, diet and exercise as appropriate  Hxmenstrualmigraines  Other Active Problems  Elevated TG 231  Hospital day # 1  This patient is critically ill due to recurrent cryptogenic strokes, left M 2 occlusion status post TPA and thrombectomy and at significant risk of neurological worsening, death form recurrent stroke, hemorrhagic conversion. This patient's care requires constant monitoring of vital signs, hemodynamics, respiratory and cardiac monitoring, review of multiple databases, neurological assessment, discussion with family, other specialists and medical decision making of high complexity. I spent 40 minutes of neurocritical care time in the care of this patient. I had long discussion with husband and daughter at bedside, updated pt current condition, treatment plan and potential prognosis, and answered all the questions.  They expressed understanding and appreciation.    Marvel Plan, MD PhD Stroke Neurology 09/26/2020 7:25  PM    To contact Stroke Continuity provider, please refer to [email or web address]. After hours, contact General Neurology

## 2020-09-26 NOTE — Discharge Instructions (Addendum)
Femoral Site Care This sheet gives you information about how to care for yourself after your procedure. Your health care provider may also give you more specific instructions. If you have problems or questions, contact your health care provider. What can I expect after the procedure? After the procedure, it is common to have:  Bruising that usually fades within 1-2 weeks.  Tenderness at the site. Follow these instructions at home: Wound care 1. Follow instructions from your health care provider about how to take care of your insertion site. Make sure you: ? Wash your hands with soap and water before you change your bandage (dressing). If soap and water are not available, use hand sanitizer. ? Change your dressing as directed- pressure dressing removed 24 hours post-procedure (and switch for bandaid), bandaid removed 72 hours post-procedure 2. Do not take baths, swim, or use a hot tub for 7 days post-procedure. 3. You may shower 48 hours after the procedure or as told by your health care provider. ? Gently wash the site with plain soap and water. ? Pat the area dry with a clean towel. ? Do not rub the site. This may cause bleeding. 4. Check your site every day for signs of infection. Check for: ? Redness, swelling, or pain. ? Fluid or blood. ? Warmth. ? Pus or a bad smell. Activity  Do not stoop, bend, or lift anything that is heavier than 10 lb (4.5 kg) for 2 weeks post-procedure.  Do not drive self for 2 weeks post-procedure. Contact a health care provider if you have:  A fever or chills.  You have redness, swelling, or pain around your insertion site. Get help right away if:  The catheter insertion area swells very fast.  You pass out.  You suddenly start to sweat or your skin gets clammy.  The catheter insertion area is bleeding, and the bleeding does not stop when you hold steady pressure on the area.  The area near or just beyond the catheter insertion site becomes  pale, cool, tingly, or numb. These symptoms may represent a serious problem that is an emergency. Do not wait to see if the symptoms will go away. Get medical help right away. Call your local emergency services (911 in the [place] Do not drive yourself to the hospital.  This information is not intended to replace advice given to you by your health care provider. Make sure you discuss any questions you have with your health care provider. Document Revised: 07/04/2017 Document Reviewed: 07/04/2017 Elsevier Patient Education  2020 [employer] 

## 2020-09-26 NOTE — Evaluation (Signed)
Physical Therapy Evaluation Patient Details Name: [name] MRN: [id number] DOB: 1970-12-07 Today's Date: 09/26/2020   History of Present Illness  50 yo female presenting with R facial droop, R sided weakness, aphasia, and L gaze. Given IV TPA on 3/24. Noncontrast head CT negative for bleed. S/p L revascularization of occluded L MCA. Extubated 3/24. 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  Clinical Impression  Pt in bed upon arrival of PT, agreeable to evaluation at this time. Prior to admission the pt was completely independent with mobility, ADLs, and was working. The pt now presents with limitations in functional mobility, stability, coordination, and activity tolerance due to above dx, and will continue to benefit from skilled PT to address these deficits. The pt was able to demo good initial bed mobility and sit-stand transfers with minG to minA for stability. The pt took short pivoting steps to recliner with minA through HHA to steady, without buckling or LOB. Will continue to benefit from skilled PT to progress ambulation distance, dynamaic stability, and capacity for stair navigation prior to return home. Will continue to assess for need for OPPT pending progression.      Follow Up Recommendations Outpatient PT;Supervision for mobility/OOB    Equipment Recommendations  None recommended by PT    Recommendations for Other Services       Precautions / Restrictions Precautions Precautions: Fall Precaution Comments: Verbal order for OOB to chair per Dr. Corliss Skains Restrictions Weight Bearing Restrictions: No      Mobility  Bed Mobility Overal bed mobility: Needs Assistance Bed Mobility: Rolling;Sidelying to Sit Rolling: Min guard Sidelying to sit: Min guard       General bed mobility comments: Min Guard A for safety    Transfers Overall transfer level: Needs assistance Equipment used: 1 person hand held assist Transfers: Sit to/from Stand Sit to  Stand: Min assist         General transfer comment: Min A for gaining balance. Single hand held for balance  Ambulation/Gait Ambulation/Gait assistance: Min assist Gait Distance (Feet): 5 Feet Assistive device: 1 person hand held assist Gait Pattern/deviations: Step-through pattern;Decreased stride length Gait velocity: decreased   General Gait Details: pt with short steps from bed to [facility] and BSC to recliner in room. MinA to steady with no LOB, short strides with no buckling      Modified Rankin (Stroke Patients Only) Modified Rankin (Stroke Patients Only) Pre-Morbid Rankin Score: No symptoms Modified Rankin: Moderately severe disability     Balance Overall balance assessment: Needs assistance Sitting-balance support: No upper extremity supported;Feet supported Sitting balance-Leahy Scale: Fair     Standing balance support: Single extremity supported;During functional activity Standing balance-Leahy Scale: Poor Standing balance comment: static stance without UE  support, minA with gait                             Pertinent Vitals/Pain Pain Assessment: No/denies pain    Home Living Family/patient expects to be discharged to:: Private residence Living Arrangements: Spouse/significant other Available Help at Discharge: Family;Available 24 hours/day Type of Home: House Home Access: Stairs to enter Entrance Stairs-Rails: None Entrance Stairs-Number of Steps: 4 Home Layout: Two level;Able to live on main level with bedroom/bathroom Home Equipment: Cane - single point;Crutches;Shower seat - built in      Prior Function Level of Independence: Independent         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 Pt able to grasp five coins, transfer to palm, and then transfer back to finger tips one at a time RUE Coordination: decreased fine motor (slow intensional movements)    Lower Extremity Assessment Lower Extremity Assessment: Overall WFL for tasks assessed    Cervical / Trunk Assessment Cervical / Trunk Assessment: Normal  Communication   Communication: Expressive difficulties  Cognition Arousal/Alertness: Awake/alert Behavior During Therapy: WFL for tasks assessed/performed Overall Cognitive Status: Difficult to assess                                 General Comments: Pt following simple commands. nodding head yes and no for responce to questions due to expressive difficulty. Pt's family reporting she has moments of inconsistently with answers to questions; pt nodding her head in agreement.      General Comments General comments (skin integrity, edema, etc.): Husband and daughter present throughout. VSS on RA    Exercises     Assessment/Plan    PT Assessment Patient needs continued PT services  PT Problem List Decreased activity tolerance;Decreased balance;Decreased mobility;Decreased coordination       PT Treatment Interventions DME instruction;Gait training;Stair training;Functional mobility training;Therapeutic activities;Therapeutic exercise;Balance training;Neuromuscular re-education;Patient/family education    PT Goals (Current goals can be found in the Care Plan section)  Acute Rehab PT Goals Patient Stated Goal: Go home and return to work PT Goal Formulation: With patient Time For Goal Achievement: 10/10/20 Potential to Achieve Goals: Good    Frequency Min 4X/week   Barriers to discharge        Co-evaluation PT/OT/SLP Co-Evaluation/Treatment: Yes Reason for Co-Treatment: For patient/therapist safety;To address functional/ADL transfers PT goals addressed during session: Mobility/safety with mobility;Balance          AM-PAC PT "6 Clicks" Mobility  Outcome Measure Help needed turning from your back to your side while in a flat bed without using bedrails?: None Help needed moving from lying on your back to sitting on the side of a flat bed without using bedrails?: None Help needed moving to and from a bed to a chair (including a wheelchair)?: A Little Help needed standing up from a chair using your arms (e.g., wheelchair or bedside chair)?: A Little Help needed to walk in hospital room?: A Little Help needed climbing 3-5 steps with a railing? : A Little 6 Click Score: 20    End of Session Equipment Utilized During Treatment: Gait belt Activity Tolerance: No increased pain Patient left: in chair;with call bell/phone within reach;with chair alarm set;with family/visitor present Nurse Communication: Mobility status PT Visit Diagnosis: Other abnormalities of gait and mobility (R26.89)    Time: 1829-9371 PT Time Calculation (min) (ACUTE ONLY): 30 min   Charges:   PT Evaluation $PT Eval Moderate Complexity: 1 Mod          [name], PT, DPT   Acute Rehabilitation Department Pager #: [phone]  Gaetana Michaelis 09/26/2020, 11:07 AM

## 2020-09-26 NOTE — Progress Notes (Signed)
  Echocardiogram 2D Echocardiogram has been performed.  Gerda Diss 09/26/2020, 11:19 AM

## 2020-09-26 NOTE — Progress Notes (Signed)
OT Cancellation Note  Patient Details Name: EDITA WEYENBERG MRN: [id number] DOB: 10-21-1970   Cancelled Treatment:    Reason Eval/Treat Not Completed: Active bedrest order (Will return as schedule allows.)  Ambri Miltner M Elhadj Girton Arista Kettlewell MSOT, OTR/L Acute Rehab Pager: [phone] Office: [phone] 09/26/2020, 8:42 AM

## 2020-09-26 NOTE — Progress Notes (Signed)
Occupational Therapy Evaluation Patient Details Name: [name] MRN: [id number] DOB: 26-Jun-1971 Today's Date: 09/26/2020    History of Present Illness  50 yo female presenting with R facial droop, R sided weakness, aphasia, and L gaze. Given IV TPA on 3/24. Noncontrast head CT negative for bleed. S/p L revascularization of occluded L MCA. Extubated 3/24. 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     Clinical Impression   PTA, pt was living with her husband and was independent and working full time. Pt currently requiring Min-Mod A for ADLs and Min A for functional mobility. Pt presenting with aphasia, decreased coordination/strength at RUE, decreased cognition, and poor balance. Pt very motivated and has good family support. Pt would benefit from further acute OT to facilitate safe dc. Recommend dc to home with follow up at OP for further OT to optimize safety, independence with ADLs, and return to PLOF.     Follow Up Recommendations  Outpatient OT;Supervision/Assistance - 24 hour    Equipment Recommendations  None recommended by OT    Recommendations for Other Services PT consult     Precautions / Restrictions Precautions Precautions: Fall Precaution Comments: Verbal order for OOB to chair per Dr. Corliss Skains this AM      Mobility Bed Mobility Overal bed mobility: Needs Assistance Bed Mobility: Rolling;Sidelying to Sit Rolling: Min guard Sidelying to sit: Min guard       General bed mobility comments: Min Guard A for safety    Transfers Overall transfer level: Needs assistance Equipment used: 1 person hand held assist Transfers: Sit to/from Stand Sit to Stand: Min assist         General transfer comment: Min A for gaining balance. Single hand held for balance    Balance Overall balance assessment: Needs assistance Sitting-balance support: No upper extremity supported;Feet supported Sitting balance-Leahy Scale: Fair     Standing balance  support: During functional activity;Single extremity supported Standing balance-Leahy Scale: Poor                             ADL either performed or assessed with clinical judgement   ADL Overall ADL's : Needs assistance/impaired Eating/Feeding: NPO   Grooming: Set up;Supervision/safety;Sitting   Upper Body Bathing: Minimal assistance;Sitting   Lower Body Bathing: Moderate assistance;Sit to/from stand   Upper Body Dressing : Minimal assistance;Sitting   Lower Body Dressing: Moderate assistance;Sit to/from stand   Toilet Transfer: Minimal assistance;+2 for safety/equipment;Ambulation;BSC   Toileting- Clothing Manipulation and Hygiene: Minimal assistance;Sit to/from stand Toileting - Clothing Manipulation Details (indicate cue type and reason): Min A for balance nad managing gown     Functional mobility during ADLs: Minimal assistance;+2 for safety/equipment General ADL Comments: Pt presenting with aphasia, decreased balance, and vision deficits.     Vision Baseline Vision/History: No visual deficits Patient Visual Report: No change from baseline Vision Assessment?: Yes Eye Alignment: Impaired (comment) (Moments of dysconjugate gaze) Ocular Range of Motion: Restricted on the right (Poor visual attention to R and unable to hold gaze R) Alignment/Gaze Preference: Gaze left (Able to track throughout visual field but preference for L gaze) Tracking/Visual Pursuits: Unable to hold eye position out of midline;Decreased smoothness of eye movement to RIGHT superior field;Decreased smoothness of eye movement to RIGHT inferior field Depth Perception:  (Denies diplopia despite dysconjugate gaze) Additional Comments: Will continue to assess     Perception     Praxis      Pertinent Vitals/Pain Pain  Assessment: No/denies pain     Hand Dominance Left   Extremity/Trunk Assessment Upper Extremity Assessment Upper Extremity Assessment: RUE deficits/detail RUE Deficits /  Details: Decreased coorindation and strength compared to L but it is functional. Pt able to grasp five coins, transfer to palm, and then transfer back to finger tips one at a time RUE Coordination: decreased fine motor (slow intensional movements)   Lower Extremity Assessment Lower Extremity Assessment: Defer to PT evaluation   Cervical / Trunk Assessment Cervical / Trunk Assessment: Normal   Communication Communication Communication: Expressive difficulties   Cognition Arousal/Alertness: Awake/alert Behavior During Therapy: WFL for tasks assessed/performed Overall Cognitive Status: Difficult to assess                                 General Comments: Pt following simple commands. nodding head yes and no for responce to questions due to expressive difficulty. Pt's family reporting she has moments of inconsistently with answers to questions; pt nodding her head in agreement.   General Comments  Husband and daughter present throughout. VSS on RA    Exercises     Shoulder Instructions      Home Living Family/patient expects to be discharged to:: Private residence Living Arrangements: Spouse/significant other Available Help at Discharge: Family;Available 24 hours/day Type of Home: House Home Access: Stairs to enter [employer] of Steps: 4 Entrance Stairs-Rails: None Home Layout: Two level;Able to live on main level with bedroom/bathroom Alternate Level Stairs-Number of Steps: Flight Alternate Level Stairs-Rails: None Bathroom Shower/Tub: Producer, television/film/video: Standard Bathroom Accessibility: Yes   Home Equipment: Cane - single point;Crutches;Shower seat - built in          Prior Functioning/Environment Level of Independence: Independent        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 [employer]        OT Problem List: Decreased strength;Decreased range of motion;Decreased activity tolerance;Impaired balance  (sitting and/or standing);Decreased knowledge of use of DME or AE;Decreased knowledge of precautions;Impaired UE functional use      OT Treatment/Interventions: Self-care/ADL training;Therapeutic exercise;Energy conservation;DME and/or AE instruction;Therapeutic activities;Patient/family education    OT Goals(Current goals can be found in the care plan section) Acute Rehab OT Goals Patient Stated Goal: Go home and return to work OT Goal Formulation: With patient Time For Goal Achievement: 10/10/20 Potential to Achieve Goals: Good  OT Frequency: Min 2X/week   Barriers to D/C:            Co-evaluation              AM-PAC OT "6 Clicks" Daily Activity     Outcome Measure Help from another person eating meals?: Total Help from another person taking care of personal grooming?: A Little Help from another person toileting, which includes using toliet, bedpan, or urinal?: A Little Help from another person bathing (including washing, rinsing, drying)?: A Lot Help from another person to put on and taking off regular upper body clothing?: A Little Help from another person to put on and taking off regular lower body clothing?: A Lot 6 Click Score: 14   End of Session Nurse Communication: Mobility status;Other (comment) (IV leaking)  Activity Tolerance: Patient tolerated treatment well Patient left: in chair;with call bell/phone within reach;with chair alarm set;with family/visitor present  OT Visit Diagnosis: Unsteadiness on feet (R26.81);Other abnormalities of gait and mobility (R26.89);Muscle weakness (generalized) (M62.81);Hemiplegia and hemiparesis Hemiplegia - Right/Left:  Right Hemiplegia - dominant/non-dominant: Non-Dominant                Time: 2122-4825 OT Time Calculation (min): 29 min Charges:  OT General Charges $OT Visit: 1 Visit OT Evaluation $OT Eval Moderate Complexity: 1 Mod  Reuben Knoblock MSOT, OTR/L Acute Rehab Pager: [phone] Office:  [phone]  Theodoro Grist Ertha Nabor 09/26/2020, 9:51 AM

## 2020-09-26 NOTE — Progress Notes (Signed)
Referring Physician(s): Code stroke - [place], Ashish [place])  Supervising Physician: Julieanne Cotton  Patient Status:  [facility] - In-pt  Chief Complaint: Stroke f/u   Subjective:  CVA s/p left MCA M2 aspiration and revascularization achieving TICI 2C with Dr. Corliss Skains on 09/25/20.  Patient laying bed comfortably with family at the bedside. Alert and oriented x 3. Has expressive aphasia, able to follow simple commands.  Able to move all extremities freely.   Allergies: Bee venom  Medications: Prior to Admission medications   Medication Sig Start Date End Date Taking? Authorizing Provider  atorvastatin (LIPITOR) 20 MG tablet Take 1 tablet (20 mg total) by mouth daily. 07/18/20  Yes Tower, Audrie Gallus, MD  clopidogrel (PLAVIX) 75 MG tablet Take 1 tablet (75 mg total) by mouth daily. 06/07/20  Yes Layne Benton, NP     Vital Signs: BP 110/62   Pulse 74   Temp 98.4 F (36.9 C) (Oral)   Resp 15   Ht 5\' 5"  (1.651 m)   Wt 195 lb 1.7 oz (88.5 kg)   SpO2 95%   BMI 32.47 kg/m   Physical Exam Vitals and nursing note reviewed.  Constitutional:      General: She is not in acute distress. Eyes:     Extraocular Movements: Extraocular movements intact.     Pupils: Pupils are equal, round, and reactive to light.  Pulmonary:     Effort: Pulmonary effort is normal.  Musculoskeletal:        General: Normal range of motion.  Skin:    Comments: Right groin puncture site clean, dry, intact   Neurological:     Mental Status: She is alert and oriented to person, place, and time.     Comments: Alert, awake, and oriented x 3  Speech and comprehension: has expressive aphasia, able to follow simple commands PERRL bilateral  EOMs intact, no nystagmus or subjective diplopia. Visual fields intact  No facial asymmetry. Tongue midline  Motor power intact  No pronator drift. Fine motor and coordination intact  Distal pulses 2 +      Imaging: CT ANGIO HEAD W OR WO CONTRAST  Result  Date: 09/25/2020 CLINICAL DATA:  Initial evaluation for acute stroke, right-sided facial droop, right arm drift. EXAM: CT ANGIOGRAPHY HEAD AND NECK TECHNIQUE: Multidetector CT imaging of the head and neck was performed using the standard protocol during bolus administration of intravenous contrast. Multiplanar CT image reconstructions and MIPs were obtained to evaluate the vascular anatomy. Carotid stenosis measurements (when applicable) are obtained utilizing NASCET criteria, using the distal internal carotid diameter as the denominator. CONTRAST:  66mL OMNIPAQUE IOHEXOL 350 MG/ML SOLN COMPARISON:  Prior CT from earlier the same day as well as multiple previous exams. FINDINGS: CTA NECK FINDINGS Aortic arch: Visualized aortic arch normal in caliber with normal branch pattern. No stenosis about the origin of the great vessels. Right carotid system: Right common and internal carotid arteries widely patent without stenosis, dissection or occlusion. Left carotid system: Left common and internal carotid arteries widely patent without stenosis, dissection or occlusion. Vertebral arteries: Both vertebral arteries arise from the subclavian arteries. Strongly dominant right vertebral artery with a diffusely hypoplastic left vertebral artery. Vertebral arteries widely patent within the neck without stenosis, dissection or occlusion. Skeleton: No visible acute osseous finding. No discrete or worrisome osseous lesions. Mild to moderate cervical spondylosis at C5-6 and C6-7. Other neck: No other acute soft tissue abnormality within the neck. No mass or adenopathy. Upper chest: Visualized upper  chest demonstrates no acute finding. Surgical clips noted about the anterior mediastinum. Review of the MIP images confirms the above findings CTA HEAD FINDINGS Anterior circulation: Both internal carotid arteries widely patent to the termini without stenosis. A1 segments widely patent. Normal anterior communicating artery complex.  Anterior cerebral arteries widely patent to their distal aspects. No M1 stenosis or occlusion. Normal MCA bifurcations. On the left, there is acute occlusion of a left M3 branch, superior division (series 7, image 95). Remainder of the left MCA branches are perfused. Distal right MCA branches well perfused as well. Posterior circulation: Dominant right V4 segment widely patent to the vertebrobasilar junction. Hypoplastic left vertebral artery terminates in PICA. Both PICA origins patent and normal. Basilar patent to its distal aspect without stenosis. Superior cerebellar arteries patent bilaterally. Both PCAs primarily supplied via the basilar well perfused to their distal aspects. Venous sinuses: Patent. Anatomic variants: Hypoplastic left vertebral artery terminates in PICA. No aneurysm. Review of the MIP images confirms the above findings IMPRESSION: 1. Acute left M3 branch occlusion, superior division. 2. Otherwise negative CTA of the head and neck. No other large vessel occlusion, hemodynamically significant stenosis, or other acute vascular abnormality. Critical Value/emergent results were discussed by telephone at the time of interpretation on 09/25/2020 at 9:25 pm with provider ASHISH ARORA. Electronically Signed   By: Rise MuBenjamin  McClintock M.D.   On: 09/25/2020 21:45   CT ANGIO NECK W OR WO CONTRAST  Result Date: 09/25/2020 CLINICAL DATA:  Initial evaluation for acute stroke, right-sided facial droop, right arm drift. EXAM: CT ANGIOGRAPHY HEAD AND NECK TECHNIQUE: Multidetector CT imaging of the head and neck was performed using the standard protocol during bolus administration of intravenous contrast. Multiplanar CT image reconstructions and MIPs were obtained to evaluate the vascular anatomy. Carotid stenosis measurements (when applicable) are obtained utilizing NASCET criteria, using the distal internal carotid diameter as the denominator. CONTRAST:  75mL OMNIPAQUE IOHEXOL 350 MG/ML SOLN COMPARISON:   Prior CT from earlier the same day as well as multiple previous exams. FINDINGS: CTA NECK FINDINGS Aortic arch: Visualized aortic arch normal in caliber with normal branch pattern. No stenosis about the origin of the great vessels. Right carotid system: Right common and internal carotid arteries widely patent without stenosis, dissection or occlusion. Left carotid system: Left common and internal carotid arteries widely patent without stenosis, dissection or occlusion. Vertebral arteries: Both vertebral arteries arise from the subclavian arteries. Strongly dominant right vertebral artery with a diffusely hypoplastic left vertebral artery. Vertebral arteries widely patent within the neck without stenosis, dissection or occlusion. Skeleton: No visible acute osseous finding. No discrete or worrisome osseous lesions. Mild to moderate cervical spondylosis at C5-6 and C6-7. Other neck: No other acute soft tissue abnormality within the neck. No mass or adenopathy. Upper chest: Visualized upper chest demonstrates no acute finding. Surgical clips noted about the anterior mediastinum. Review of the MIP images confirms the above findings CTA HEAD FINDINGS Anterior circulation: Both internal carotid arteries widely patent to the termini without stenosis. A1 segments widely patent. Normal anterior communicating artery complex. Anterior cerebral arteries widely patent to their distal aspects. No M1 stenosis or occlusion. Normal MCA bifurcations. On the left, there is acute occlusion of a left M3 branch, superior division (series 7, image 95). Remainder of the left MCA branches are perfused. Distal right MCA branches well perfused as well. Posterior circulation: Dominant right V4 segment widely patent to the vertebrobasilar junction. Hypoplastic left vertebral artery terminates in PICA. Both PICA origins patent and  normal. Basilar patent to its distal aspect without stenosis. Superior cerebellar arteries patent bilaterally. Both  PCAs primarily supplied via the basilar well perfused to their distal aspects. Venous sinuses: Patent. Anatomic variants: Hypoplastic left vertebral artery terminates in PICA. No aneurysm. Review of the MIP images confirms the above findings IMPRESSION: 1. Acute left M3 branch occlusion, superior division. 2. Otherwise negative CTA of the head and neck. No other large vessel occlusion, hemodynamically significant stenosis, or other acute vascular abnormality. Critical Value/emergent results were discussed by telephone at the time of interpretation on 09/25/2020 at 9:25 pm with provider ASHISH ARORA. Electronically Signed   By: Rise Mu M.D.   On: 09/25/2020 21:45   CT HEAD CODE STROKE WO CONTRAST  Result Date: 09/25/2020 CLINICAL DATA:  Code stroke. Initial evaluation for acute right-sided facial droop, right arm drift. EXAM: CT HEAD WITHOUT CONTRAST TECHNIQUE: Contiguous axial images were obtained from the base of the skull through the vertex without intravenous contrast. COMPARISON:  Prior MRI from 06/04/2020. FINDINGS: Brain: Cerebral volume within normal limits. No acute intracranial hemorrhage. No acute large vessel territory infarct. No mass lesion, midline shift or mass effect. No hydrocephalus or extra-axial fluid collection. Vascular: No hyperdense vessel. Skull: Scalp soft tissues and calvarium within normal limits. Sinuses/Orbits: Left gaze noted. Globes and orbital soft tissues otherwise unremarkable. Left maxillary sinus retention cyst. Additional scattered mucosal thickening noted within the ethmoidal air cells and maxillary sinuses. Paranasal sinuses are otherwise clear. No mastoid effusion. Other: None. ASPECTS [facility] Stroke Program Early CT Score) - Ganglionic level infarction (caudate, lentiform nuclei, internal capsule, insula, M1-M3 cortex): 7 - Supraganglionic infarction (M4-M6 cortex): 3 Total score (0-10 with 10 being normal): 10 IMPRESSION: 1. Negative head CT.  No acute  intracranial abnormality. 2. ASPECTS is 10. These results were communicated to Dr. Wilford Corner at 9:25 pmon 3/24/2022by text page via the [facility] messaging system. Electronically Signed   By: Rise Mu M.D.   On: 09/25/2020 21:30    Labs:  CBC: Recent Labs    03/14/20 0204 06/03/20 1446 06/03/20 1458 08/01/20 1405 09/25/20 2118  WBC 10.2 7.2  --  6.3 8.8  HGB 12.8 13.6 13.6 12.9 13.1  12.6  HCT 37.3 40.1 40.0 37.7 38.9  37.0  PLT 219 277  --  282 220    COAGS: Recent Labs    03/11/20 1434 06/03/20 1446 09/25/20 2118  INR 0.9 0.9 1.0  APTT 23* 22* 26    BMP: Recent Labs    03/12/20 1811 03/13/20 0405 03/14/20 0204 05/12/20 1652 06/03/20 1446 06/03/20 1458 08/01/20 1405 09/25/20 2118  NA 140 137 138 138 139 141 141 136  139  K 3.9 3.9 3.6 3.9 3.5 3.5 3.6 3.3*  3.3*  CL 106 104 103 102 104 103 106 104  104  CO2 24 23 26 24 23   --  25 22  GLUCOSE 102* 105* 107* 123* 113* 108* 90 121*  120*  BUN 7 8 10 10 7 6 8 16  19   CALCIUM 8.6* 8.7* 8.9 9.2 9.1  --  9.3 8.7*  CREATININE 0.64 0.64 0.71 0.77 0.81 0.70 0.75 0.71  0.70  GFRNONAA >60 >60 >60 92 >60  --  >60 >60  GFRAA >60 >60 >60 106  --   --   --   --     LIVER FUNCTION TESTS: Recent Labs    03/11/20 1434 03/12/20 1811 06/03/20 1446 08/01/20 1405 09/25/20 2118  BILITOT 0.9  --  0.9 0.5 0.5  AST 20  --  20 14* 25  ALT 20  --  23 19 24   ALKPHOS 71  --  90 102 69  PROT 6.9  --  6.8 8.1 6.8  ALBUMIN 4.0 3.4* 3.9 4.2 3.7    Assessment and Plan:  CVA s/p left MCA M2 aspiration and revascularization achieving TICI 2C with Dr. on 09/25/20.  Patient laying bed comfortably with family at the bedside. Alert and oriented x 3. Has expressive aphasia, able to follow simple commands.  Able to move all extremities freely.  Follow up in clinic in 4-6 week with Dr. 09/27/20  PT/OT/Speech therapy  Further plan per nuerology Appreciate and agree with the treatment.  Please call NIR for  concerns and questions.    Electronically Signed: Corliss Skains, PA-C 09/26/2020, 9:20 AM   I spent a total of 25 Minutes at the the patient's bedside AND on the patient's hospital floor or unit, greater than 50% of which was counseling/coordinating care for CVA s/p aspiration and revascularization.     '

## 2020-09-26 NOTE — Anesthesia Postprocedure Evaluation (Signed)
Anesthesia Post Note  Patient: [name]  Procedure(s) Performed: RADIOLOGY WITH ANESTHESIA (N/A )     Patient location during evaluation: SICU Anesthesia Type: General Level of consciousness: awake Pain management: pain level controlled Vital Signs Assessment: post-procedure vital signs reviewed and stable Respiratory status: patient connected to face mask oxygen Cardiovascular status: stable Postop Assessment: no apparent nausea or vomiting Anesthetic complications: no   No complications documented.  Last Vitals:  Vitals:   09/26/20 0000 09/26/20 0015  BP: (!) 115/55 (!) 114/55  Pulse: 73 75  Resp: 20 17  Temp:    SpO2: 99% 94%    Last Pain:  Vitals:   09/25/20 2139  TempSrc:   PainSc: 0-No pain                 Nelle Don Zanetta Dehaan

## 2020-09-26 NOTE — Transfer of Care (Signed)
Immediate Anesthesia Transfer of Care Note  Patient: [name]  Procedure(s) Performed: RADIOLOGY WITH ANESTHESIA (N/A )  Patient Location: ICU  Anesthesia Type:General  Level of Consciousness: drowsy  Airway & Oxygen Therapy: Patient Spontanous Breathing and Patient connected to face mask oxygen  Post-op Assessment: Report given to RN, Post -op Vital signs reviewed and stable and Patient moving all extremities  Post vital signs: Reviewed and stable  Last Vitals:  Vitals Value Taken Time  BP 115/55 09/26/20 0000  Temp    Pulse 79 09/26/20 0012  Resp 17 09/26/20 0012  SpO2 97 % 09/26/20 0012  Vitals shown include unvalidated device data.  Last Pain:  Vitals:   09/25/20 2139  TempSrc:   PainSc: 0-No pain         Complications: No complications documented.

## 2020-09-26 NOTE — Progress Notes (Signed)
PT Cancellation Note  Patient Details Name: [name] MRN: [id number] DOB: Mar 08, 1971   Cancelled Treatment:    Reason Eval/Treat Not Completed: Patient not medically ready this morning, with active bedrest orders and verbal orders per RN to remain flat. Will continue to follow and evaluate as appropriate.   Rolm Baptise, PT, DPT   Acute Rehabilitation Department Pager #: [phone]   Gaetana Michaelis 09/26/2020, 8:42 AM

## 2020-09-27 MED ORDER — ASPIRIN EC 81 MG PO TBEC
81.0000 mg | DELAYED_RELEASE_TABLET | Freq: Every day | ORAL | Status: DC
  Administered 2020-09-27 – 2020-09-29 (×3): 81 mg via ORAL
  Filled 2020-09-27 (×3): qty 1

## 2020-09-27 NOTE — Progress Notes (Signed)
Pt has been admitted to the unit. Family is at bedside. All belongings are present with the pt and call light and telephone are within reach.  09/27/20 1025  Vitals  Temp 98.1 F (36.7 C)  Temp Source Oral  BP (!) 107/56  MAP (mmHg) 73  BP Location Right Arm  BP Method Automatic  Patient Position (if appropriate) Lying  Pulse Rate 66  Pulse Rate Source Dinamap  Resp 18  Level of Consciousness  Level of Consciousness Alert  MEWS COLOR  MEWS Score Color Green  Oxygen Therapy  SpO2 99 %  O2 Device Room Air  Patient Activity (if Appropriate) In bed  Pulse Oximetry Type Intermittent  Pain Assessment  Pain Scale 0-10  Pain Score 0  MEWS Score  MEWS Temp 0  MEWS Systolic 0  MEWS Pulse 0  MEWS RR 0  MEWS LOC 0  MEWS Score 0

## 2020-09-27 NOTE — Progress Notes (Signed)
STROKE TEAM PROGRESS NOTE   SUBJECTIVE (INTERVAL HISTORY) Her husband at the bedside.  Overall her condition is gradually improving.  Still has expressive aphasia, however follow simple commands, moving all extremities.  Symptoms much improved from last night, status post TPA and thrombectomy.  MRI scan reviewed in person and discussed with the husband.   OBJECTIVE Temp:  [98.1 F (36.7 C)-98.8 F (37.1 C)] 98.6 F (37 C) (03/26 0400) Pulse Rate:  [61-96] 65 (03/26 0900) Resp:  [11-20] 14 (03/26 0900) BP: (91-113)/(42-73) 98/67 (03/26 0900) SpO2:  [91 %-99 %] 93 % (03/26 0900) Arterial Line BP: (73-154)/(53-71) 128/64 (03/26 0900)  Recent Labs  Lab 09/25/20 2112  GLUCAP 116*   Recent Labs  Lab 09/25/20 2118  NA 136  139  K 3.3*  3.3*  CL 104  104  CO2 22  GLUCOSE 121*  120*  BUN 16  19  CREATININE 0.71  0.70  CALCIUM 8.7*   Recent Labs  Lab 09/25/20 2118  AST 25  ALT 24  ALKPHOS 69  BILITOT 0.5  PROT 6.8  ALBUMIN 3.7   Recent Labs  Lab 09/25/20 2118  WBC 8.8  NEUTROABS 4.9  HGB 13.1  12.6  HCT 38.9  37.0  MCV 94.9  PLT 220   No results for input(s): CKTOTAL, CKMB, CKMBINDEX, TROPONINI in the last 168 hours. Recent Labs    09/25/20 2118  LABPROT 13.0  INR 1.0   No results for input(s): COLORURINE, LABSPEC, PHURINE, GLUCOSEU, HGBUR, BILIRUBINUR, KETONESUR, PROTEINUR, UROBILINOGEN, NITRITE, LEUKOCYTESUR in the last 72 hours.  Invalid input(s): APPERANCEUR     Component Value Date/Time   CHOL 109 09/26/2020 0317   CHOL 160 05/20/2020 0909   TRIG 231 (H) 09/26/2020 0317   HDL 40 (L) 09/26/2020 0317   HDL 56 05/20/2020 0909   CHOLHDL 2.7 09/26/2020 0317   VLDL 46 (H) 09/26/2020 0317   LDLCALC 23 09/26/2020 0317   LDLCALC 80 05/20/2020 0909   Lab Results  Component Value Date   HGBA1C 5.4 09/26/2020      Component Value Date/Time   LABOPIA NONE DETECTED 03/12/2020 1226   COCAINSCRNUR NONE DETECTED 03/12/2020 1226   LABBENZ NONE  DETECTED 03/12/2020 1226   AMPHETMU NONE DETECTED 03/12/2020 1226   THCU NONE DETECTED 03/12/2020 1226   LABBARB NONE DETECTED 03/12/2020 1226    No results for input(s): ETH in the last 168 hours.  I have personally reviewed the radiological images below and agree with the radiology interpretations.  CT ANGIO HEAD W OR WO CONTRAST  Result Date: 09/25/2020 CLINICAL DATA:  Initial evaluation for acute stroke, right-sided facial droop, right arm drift. EXAM: CT ANGIOGRAPHY HEAD AND NECK TECHNIQUE: Multidetector CT imaging of the head and neck was performed using the standard protocol during bolus administration of intravenous contrast. Multiplanar CT image reconstructions and MIPs were obtained to evaluate the vascular anatomy. Carotid stenosis measurements (when applicable) are obtained utilizing NASCET criteria, using the distal internal carotid diameter as the denominator. CONTRAST:  75mL OMNIPAQUE IOHEXOL 350 MG/ML SOLN COMPARISON:  Prior CT from earlier the same day as well as multiple previous exams. FINDINGS: CTA NECK FINDINGS Aortic arch: Visualized aortic arch normal in caliber with normal branch pattern. No stenosis about the origin of the great vessels. Right carotid system: Right common and internal carotid arteries widely patent without stenosis, dissection or occlusion. Left carotid system: Left common and internal carotid arteries widely patent without stenosis, dissection or occlusion. Vertebral arteries: Both vertebral arteries arise from  the subclavian arteries. Strongly dominant right vertebral artery with a diffusely hypoplastic left vertebral artery. Vertebral arteries widely patent within the neck without stenosis, dissection or occlusion. Skeleton: No visible acute osseous finding. No discrete or worrisome osseous lesions. Mild to moderate cervical spondylosis at C5-6 and C6-7. Other neck: No other acute soft tissue abnormality within the neck. No mass or adenopathy. Upper chest:  Visualized upper chest demonstrates no acute finding. Surgical clips noted about the anterior mediastinum. Review of the MIP images confirms the above findings CTA HEAD FINDINGS Anterior circulation: Both internal carotid arteries widely patent to the termini without stenosis. A1 segments widely patent. Normal anterior communicating artery complex. Anterior cerebral arteries widely patent to their distal aspects. No M1 stenosis or occlusion. Normal MCA bifurcations. On the left, there is acute occlusion of a left M3 branch, superior division (series 7, image 95). Remainder of the left MCA branches are perfused. Distal right MCA branches well perfused as well. Posterior circulation: Dominant right V4 segment widely patent to the vertebrobasilar junction. Hypoplastic left vertebral artery terminates in PICA. Both PICA origins patent and normal. Basilar patent to its distal aspect without stenosis. Superior cerebellar arteries patent bilaterally. Both PCAs primarily supplied via the basilar well perfused to their distal aspects. Venous sinuses: Patent. Anatomic variants: Hypoplastic left vertebral artery terminates in PICA. No aneurysm. Review of the MIP images confirms the above findings IMPRESSION: 1. Acute left M3 branch occlusion, superior division. 2. Otherwise negative CTA of the head and neck. No other large vessel occlusion, hemodynamically significant stenosis, or other acute vascular abnormality. Critical Value/emergent results were discussed by telephone at the time of interpretation on 09/25/2020 at 9:25 pm with provider ASHISH ARORA. Electronically Signed   By: Rise Mu M.D.   On: 09/25/2020 21:45   CT ANGIO NECK W OR WO CONTRAST  Result Date: 09/25/2020 CLINICAL DATA:  Initial evaluation for acute stroke, right-sided facial droop, right arm drift. EXAM: CT ANGIOGRAPHY HEAD AND NECK TECHNIQUE: Multidetector CT imaging of the head and neck was performed using the standard protocol during  bolus administration of intravenous contrast. Multiplanar CT image reconstructions and MIPs were obtained to evaluate the vascular anatomy. Carotid stenosis measurements (when applicable) are obtained utilizing NASCET criteria, using the distal internal carotid diameter as the denominator. CONTRAST:  75mL OMNIPAQUE IOHEXOL 350 MG/ML SOLN COMPARISON:  Prior CT from earlier the same day as well as multiple previous exams. FINDINGS: CTA NECK FINDINGS Aortic arch: Visualized aortic arch normal in caliber with normal branch pattern. No stenosis about the origin of the great vessels. Right carotid system: Right common and internal carotid arteries widely patent without stenosis, dissection or occlusion. Left carotid system: Left common and internal carotid arteries widely patent without stenosis, dissection or occlusion. Vertebral arteries: Both vertebral arteries arise from the subclavian arteries. Strongly dominant right vertebral artery with a diffusely hypoplastic left vertebral artery. Vertebral arteries widely patent within the neck without stenosis, dissection or occlusion. Skeleton: No visible acute osseous finding. No discrete or worrisome osseous lesions. Mild to moderate cervical spondylosis at C5-6 and C6-7. Other neck: No other acute soft tissue abnormality within the neck. No mass or adenopathy. Upper chest: Visualized upper chest demonstrates no acute finding. Surgical clips noted about the anterior mediastinum. Review of the MIP images confirms the above findings CTA HEAD FINDINGS Anterior circulation: Both internal carotid arteries widely patent to the termini without stenosis. A1 segments widely patent. Normal anterior communicating artery complex. Anterior cerebral arteries widely patent to their distal  aspects. No M1 stenosis or occlusion. Normal MCA bifurcations. On the left, there is acute occlusion of a left M3 branch, superior division (series 7, image 95). Remainder of the left MCA branches are  perfused. Distal right MCA branches well perfused as well. Posterior circulation: Dominant right V4 segment widely patent to the vertebrobasilar junction. Hypoplastic left vertebral artery terminates in PICA. Both PICA origins patent and normal. Basilar patent to its distal aspect without stenosis. Superior cerebellar arteries patent bilaterally. Both PCAs primarily supplied via the basilar well perfused to their distal aspects. Venous sinuses: Patent. Anatomic variants: Hypoplastic left vertebral artery terminates in PICA. No aneurysm. Review of the MIP images confirms the above findings IMPRESSION: 1. Acute left M3 branch occlusion, superior division. 2. Otherwise negative CTA of the head and neck. No other large vessel occlusion, hemodynamically significant stenosis, or other acute vascular abnormality. Critical Value/emergent results were discussed by telephone at the time of interpretation on 09/25/2020 at 9:25 pm with provider ASHISH ARORA. Electronically Signed   By: Rise Mu M.D.   On: 09/25/2020 21:45   MR ANGIO HEAD WO CONTRAST  Result Date: 09/26/2020 CLINICAL DATA:  Follow-up examination for acute stroke, previous MCA branch occlusion, status post tPA and catheter directed revascularization. EXAM: MRI HEAD WITHOUT CONTRAST MRA HEAD WITHOUT CONTRAST TECHNIQUE: Multiplanar, multiecho pulse sequences of the brain and surrounding structures were obtained without intravenous contrast. Angiographic images of the head were obtained using MRA technique without contrast. COMPARISON:  Prior studies from 09/25/2020 FINDINGS: MRI HEAD FINDINGS Brain: Cerebral volume within normal limits for age. Minimal scattered T2/FLAIR hyperintensity noted within the periventricular white matter, most likely related chronic microvascular ischemic disease, minimal for age. Area of confluent restricted diffusion seen involving the anterior left frontal region with mild involvement of the underlying anterior left  insular cortex, consistent with acute left MCA distribution infarct (series 3, image 32). Associated early gyral swelling and edema within the area of infarction without significant regional mass effect. Subcentimeter focus of susceptibility artifact at the left frontal operculum/external capsule likely reflects a small focus of petechial hemorrhage (series 8, image 13). No frank malignant hemorrhagic transformation. No other evidence for acute or subacute ischemia. Gray-white matter differentiation otherwise maintained. No other areas of chronic cortical infarction. No other evidence for acute or chronic intracranial hemorrhage. No mass lesion, midline shift or mass effect. No hydrocephalus or extra-axial fluid collection. Pituitary gland suprasellar region normal. Midline structures intact. Vascular: Major intracranial vascular flow voids are well maintained. Skull and upper cervical spine: Craniocervical junction within normal limits. Bone marrow signal intensity normal. No focal marrow replacing lesion. No scalp soft tissue abnormality. Sinuses/Orbits: Globes and orbital soft tissues within normal limits. Left maxillary sinus retention cysts noted. Additional mild mucosal thickening elsewhere within the paranasal sinuses. No mastoid effusion. Inner ear structures within normal limits. Other: None. MRA HEAD FINDINGS ANTERIOR CIRCULATION: Visualized distal cervical segments of the internal carotid arteries are patent with antegrade flow. Petrous, cavernous, and supraclinoid segments widely patent without stenosis or other abnormality. A1 segments widely patent. Normal anterior communicating artery complex. Anterior cerebral arteries widely patent to their distal aspects. No M1 stenosis or occlusion. Normal MCA bifurcations. Interval revascularization of previously identified left MCA branch occlusion. MCA branches now well perfused and patent bilaterally. POSTERIOR CIRCULATION: Dominant right vertebral artery  widely patent to the vertebrobasilar junction. Left vertebral artery markedly hypoplastic and terminates in PICA. Both PICA patent. Basilar patent to its distal aspect without stenosis. Superior cerebellar arteries patent bilaterally. Both  PCAs primarily supplied via the basilar well perfused to their distal aspects. No intracranial aneurysm. IMPRESSION: MRI HEAD IMPRESSION: 1. Acute ischemic left MCA distribution infarct as above. Subcentimeter focus of susceptibility artifact consistent with a small focus of petechial hemorrhage. No frank malignant hemorrhagic transformation or significant mass effect. 2. Otherwise essentially normal brain MRI for age. MRA HEAD IMPRESSION: 1. Interval revascularization of previously identified left MCA branch occlusion, now patent. 2. Otherwise stable and normal intracranial MRA. Electronically Signed   By: Rise Mu M.D.   On: 09/26/2020 23:04   MR BRAIN WO CONTRAST  Result Date: 09/26/2020 CLINICAL DATA:  Follow-up examination for acute stroke, previous MCA branch occlusion, status post tPA and catheter directed revascularization. EXAM: MRI HEAD WITHOUT CONTRAST MRA HEAD WITHOUT CONTRAST TECHNIQUE: Multiplanar, multiecho pulse sequences of the brain and surrounding structures were obtained without intravenous contrast. Angiographic images of the head were obtained using MRA technique without contrast. COMPARISON:  Prior studies from 09/25/2020 FINDINGS: MRI HEAD FINDINGS Brain: Cerebral volume within normal limits for age. Minimal scattered T2/FLAIR hyperintensity noted within the periventricular white matter, most likely related chronic microvascular ischemic disease, minimal for age. Area of confluent restricted diffusion seen involving the anterior left frontal region with mild involvement of the underlying anterior left insular cortex, consistent with acute left MCA distribution infarct (series 3, image 32). Associated early gyral swelling and edema within the  area of infarction without significant regional mass effect. Subcentimeter focus of susceptibility artifact at the left frontal operculum/external capsule likely reflects a small focus of petechial hemorrhage (series 8, image 13). No frank malignant hemorrhagic transformation. No other evidence for acute or subacute ischemia. Gray-white matter differentiation otherwise maintained. No other areas of chronic cortical infarction. No other evidence for acute or chronic intracranial hemorrhage. No mass lesion, midline shift or mass effect. No hydrocephalus or extra-axial fluid collection. Pituitary gland suprasellar region normal. Midline structures intact. Vascular: Major intracranial vascular flow voids are well maintained. Skull and upper cervical spine: Craniocervical junction within normal limits. Bone marrow signal intensity normal. No focal marrow replacing lesion. No scalp soft tissue abnormality. Sinuses/Orbits: Globes and orbital soft tissues within normal limits. Left maxillary sinus retention cysts noted. Additional mild mucosal thickening elsewhere within the paranasal sinuses. No mastoid effusion. Inner ear structures within normal limits. Other: None. MRA HEAD FINDINGS ANTERIOR CIRCULATION: Visualized distal cervical segments of the internal carotid arteries are patent with antegrade flow. Petrous, cavernous, and supraclinoid segments widely patent without stenosis or other abnormality. A1 segments widely patent. Normal anterior communicating artery complex. Anterior cerebral arteries widely patent to their distal aspects. No M1 stenosis or occlusion. Normal MCA bifurcations. Interval revascularization of previously identified left MCA branch occlusion. MCA branches now well perfused and patent bilaterally. POSTERIOR CIRCULATION: Dominant right vertebral artery widely patent to the vertebrobasilar junction. Left vertebral artery markedly hypoplastic and terminates in PICA. Both PICA patent. Basilar patent  to its distal aspect without stenosis. Superior cerebellar arteries patent bilaterally. Both PCAs primarily supplied via the basilar well perfused to their distal aspects. No intracranial aneurysm. IMPRESSION: MRI HEAD IMPRESSION: 1. Acute ischemic left MCA distribution infarct as above. Subcentimeter focus of susceptibility artifact consistent with a small focus of petechial hemorrhage. No frank malignant hemorrhagic transformation or significant mass effect. 2. Otherwise essentially normal brain MRI for age. MRA HEAD IMPRESSION: 1. Interval revascularization of previously identified left MCA branch occlusion, now patent. 2. Otherwise stable and normal intracranial MRA. Electronically Signed   By: Janell Quiet.D.  On: 09/26/2020 23:04   IR CT Head Ltd  Result Date: 09/27/2020 INDICATION: New onset left gaze deviation, wrist and hand paresthesias and aphasia. Occluded left middle cerebral artery superior division on CT angiogram of the head and neck. EXAM: 1. EMERGENT LARGE VESSEL OCCLUSION THROMBOLYSIS (anterior CIRCULATION) COMPARISON:  CT angiogram of the head and neck of September 25, 2020. MEDICATIONS: Ancef 2 g IV antibiotic was administered within 1 hour of the procedure. ANESTHESIA/SEDATION: General anesthesia CONTRAST:  Isovue 300 approximately 50 mL FLUOROSCOPY TIME:  Fluoroscopy Time: 28 minutes 30 seconds (1,100 mGy). COMPLICATIONS: None immediate. TECHNIQUE: Following a full explanation of the procedure along with the potential associated complications, an informed witnessed consent was obtained. The risks of intracranial hemorrhage of 10%, worsening neurological deficit, ventilator dependency, death and inability to revascularize were all reviewed in detail with the patient's spouse. The patient was then put under general anesthesia by the Department of Anesthesiology at [facility]. The right groin was prepped and draped in the usual sterile fashion. Thereafter using modified  Seldinger technique, transfemoral access into the right common femoral artery was obtained without difficulty. Over a 0.035 inch guidewire an 8 French 25 cm Pinnacle sheath was inserted. Through this, and also over a 0.035 inch guidewire a 5 French Simmons 2 support catheter inside of an 087 balloon guide catheter was advanced to the aortic arch region and selectively cannulated in the left common carotid artery to the aortic arch region and selectively positioned in the left common carotid artery at the bifurcation. FINDINGS: The left common carotid arteriogram demonstrates the left external carotid artery and its major branches to be widely patent. The left internal carotid artery at the bulb to the cranial skull base is widely patent. The petrous, the cavernous and the supraclinoid segments are widely patent. The left anterior cerebral artery opacifies into the capillary and venous phases. The left middle cerebral artery opacifies into the capillary and venous phases. Demonstrated is occlusion of the superior division of the left middle cerebral artery in its mid M2 segment. A prominent area of hypoperfusion is demonstrated on the delayed arterial and capillary phases. PROCEDURE: Through the balloon guide catheter, a combination of an 055 136 cm Zoom aspiration catheter inside of which was a 35 160 cm Zoom aspiration Trevo ProVue microcatheter was advanced to the M1 segment of the left middle cerebral artery. The micro guidewire was then gently manipulated and advanced through the occluded superior division projecting medially and then laterally followed by the 35 Zoom aspiration catheter which was buried in the proximal occluded segment. The guidewire was removed. There was no aspiration at the hub of the 35 Zoom aspiration catheter. Constant aspiration was then applied with a 20 mL syringe at the hub of the 35 Zoom aspiration catheter, and also at the 55 Zoom aspiration catheter with a Penumbra aspiration  device. The Zoom 55 had been advanced into the mid superior division. After approximately 2 minutes, the combination of the 35 and the 55 Zoom aspiration catheters was retrieved and removed. Proximal flow arrest was reversed. A control arteriogram performed through the balloon guide catheter now in the distal left internal carotid artery demonstrated minimal change in the occluded distal superior division. A second pass was then made again using the above combination. The 35 Zoom aspiration catheter was advanced just proximal to the occlusion. A gentle control arteriogram performed through this demonstrated a large filling defect just distal to this extending into the M3 segment. An Aristotle 014 inch  micro guidewire was then introduced through the 35 Zoom aspiration catheter. The guidewire was advanced more distally into the M3 M4 region followed by the 35 Zoom aspiration catheter in the distal M3 segment. Aspiration was then applied with a 20 mL syringe at the 55 Zoom aspiration catheter hub, and also at the hub of the 55 Zoom aspiration catheter which was then advanced more distally with proximal flow arrest. After approximately 2 minutes, the combination of the 35 Zoom and the 55 Zoom aspiration catheter was retrieved and removed. Flow arrest was reversed. Control arteriogram performed through the balloon guide in the distal left ICA demonstrated now near complete revascularization of the superior division of the left middle cerebral artery. There was occlusion in the distal M4 region of this branch with a focal area of hypoperfusion. A TICI 2C revascularization was achieved. Balloon guide was then retrieved into the left common carotid artery. A control arteriogram performed through this demonstrated patency of the left ICA extra cranially and intracranially. The left anterior cerebral artery remained widely patent. The left middle cerebral artery distribution demonstrated no evidence of intraluminal filling  defects or of occlusions except for the distal M4 occlusion. The balloon guide was removed. An 8 French Angio-Seal closure device was applied for hemostasis in the right groin puncture site. Distal pulses remained Dopplerable in all fours unchanged. A CT scan of the brain demonstrated no evidence of gross hemorrhage or mass effect or midline shift. A less than 1 cm area of contrast blush was noted in the subcortical mid temporal/parietal region. Patient's general anesthesia was then reversed and the patient was extubated. Upon recovery the patient was able to obey simple instructions appropriately. She had minimal weakness in the right upper extremity with a right facial droop. She exhibited no verbal output. She was then transferred to neuro ICU for post revascularization management. IMPRESSION: Status post near complete revascularization of occluded superior division of the left middle cerebral artery and the mid M2 segment with 2 passes with contact aspiration and proximal aspiration achieving a TICI 2C revascularization. PLAN: Follow-up in the clinic approximately 4 weeks post discharge. Electronically Signed   By: Julieanne Cotton M.D.   On: 09/26/2020 10:28   ECHOCARDIOGRAM COMPLETE  Result Date: 09/26/2020    ECHOCARDIOGRAM REPORT   Patient Name:   [name] Date of Exam: 09/26/2020 Medical Rec #:  [id number]      Height:       65.0 in Accession #:    [id number]     Weight:       195.1 lb Date of Birth:  [birth year]      BSA:          1.957 m Patient Age:    49 years       BP:           110/62 mmHg Patient Gender: F              HR:           74 bpm. Exam Location:  Inpatient Procedure: 2D Echo, Cardiac Doppler and Color Doppler Indications:    Stroke  History:        Patient has prior history of Echocardiogram examinations, most                 recent 06/03/2020. Risk Factors:Former Smoker. H/O PFO closure                 30 years ago.  Sonographer:  Ross LudwigArthur Guy RDCS (AE) Referring Phys: [id number]  ASHISH ARORA IMPRESSIONS  1. Left ventricular ejection fraction, by estimation, is 60 to 65%. The left ventricle has normal function. The left ventricle has no regional wall motion abnormalities. Left ventricular diastolic parameters were normal.  2. Right ventricular systolic function is normal. The right ventricular size is normal. There is normal pulmonary artery systolic pressure.  3. The atrial septum appears intact, without residual shunt, without a visible closure device (surgical closure?). It tends to bow right to left, but there are no other signs of high right atrial pressure.  4. The mitral valve is normal in structure. No evidence of mitral valve regurgitation. No evidence of mitral stenosis.  5. The aortic valve is normal in structure. Aortic valve regurgitation is not visualized. No aortic stenosis is present.  6. The inferior vena cava is normal in size with greater than 50% respiratory variability, suggesting right atrial pressure of 3 mmHg. Comparison(s): A prior study was performed on 03/13/2020. The small saline contrast shunt seen on TEE was not demonstrated on the current study. FINDINGS  Left Ventricle: Left ventricular ejection fraction, by estimation, is 60 to 65%. The left ventricle has normal function. The left ventricle has no regional wall motion abnormalities. The left ventricular internal cavity size was normal in size. There is  no left ventricular hypertrophy. Left ventricular diastolic parameters were normal. Indeterminate filling pressures. Right Ventricle: The right ventricular size is normal. No increase in right ventricular wall thickness. Right ventricular systolic function is normal. There is normal pulmonary artery systolic pressure. The tricuspid regurgitant velocity is 2.37 m/s, and  with an assumed right atrial pressure of 8 mmHg, the estimated right ventricular systolic pressure is 30.5 mmHg. Left Atrium: Left atrial size was normal in size. Right Atrium: Right atrial  size was normal in size. Pericardium: There is no evidence of pericardial effusion. Mitral Valve: The mitral valve is normal in structure. No evidence of mitral valve regurgitation. No evidence of mitral valve stenosis. MV peak gradient, 3.1 mmHg. The mean mitral valve gradient is 1.0 mmHg. Tricuspid Valve: The tricuspid valve is normal in structure. Tricuspid valve regurgitation is not demonstrated. No evidence of tricuspid stenosis. Aortic Valve: The aortic valve is normal in structure. Aortic valve regurgitation is not visualized. No aortic stenosis is present. Aortic valve mean gradient measures 5.0 mmHg. Aortic valve peak gradient measures 9.7 mmHg. Aortic valve area, by VTI measures 1.89 cm. Pulmonic Valve: The pulmonic valve was normal in structure. Pulmonic valve regurgitation is not visualized. No evidence of pulmonic stenosis. Aorta: The aortic root is normal in size and structure. Venous: The inferior vena cava is normal in size with greater than 50% respiratory variability, suggesting right atrial pressure of 3 mmHg. IAS/Shunts: No atrial level shunt detected by color flow Doppler.  LEFT VENTRICLE PLAX 2D LVIDd:         4.20 cm  Diastology LVIDs:         3.10 cm  LV e' medial:    5.63 cm/s LV PW:         1.00 cm  LV E/e' medial:  16.2 LV IVS:        1.00 cm  LV e' lateral:   8.25 cm/s LVOT diam:     1.90 cm  LV E/e' lateral: 11.1 LV SV:         61 LV SV Index:   31 LVOT Area:     2.84 cm  RIGHT VENTRICLE  IVC RV Basal diam:  2.60 cm     IVC diam: 1.60 cm RV S prime:     10.10 cm/s TAPSE (M-mode): 2.5 cm LEFT ATRIUM             Index       RIGHT ATRIUM           Index LA diam:        2.60 cm 1.33 cm/m  RA Area:     13.10 cm LA Vol (A2C):   47.0 ml 24.01 ml/m RA Volume:   26.20 ml  13.38 ml/m LA Vol (A4C):   44.3 ml 22.63 ml/m LA Biplane Vol: 48.5 ml 24.78 ml/m  AORTIC VALVE AV Area (Vmax):    2.00 cm AV Area (Vmean):   2.09 cm AV Area (VTI):     1.89 cm AV Vmax:           156.00 cm/s  AV Vmean:          95.700 cm/s AV VTI:            0.323 m AV Peak Grad:      9.7 mmHg AV Mean Grad:      5.0 mmHg LVOT Vmax:         110.00 cm/s LVOT Vmean:        70.600 cm/s LVOT VTI:          0.215 m LVOT/AV VTI ratio: 0.67  AORTA Ao Root diam: 3.00 cm Ao Asc diam:  2.80 cm MITRAL VALVE               TRICUSPID VALVE MV Area (PHT): 2.22 cm    TR Peak grad:   22.5 mmHg MV Area VTI:   2.00 cm    TR Vmax:        237.00 cm/s MV Peak grad:  3.1 mmHg MV Mean grad:  1.0 mmHg    SHUNTS MV Vmax:       0.87 m/s    Systemic VTI:  0.22 m MV Vmean:      51.4 cm/s   Systemic Diam: 1.90 cm MV Decel Time: 342 msec MV E velocity: 91.30 cm/s MV A velocity: 74.60 cm/s MV E/A ratio:  1.22 Mihai Croitoru MD Electronically signed by Thurmon Fair MD Signature Date/Time: 09/26/2020/1:53:17 PM    Final    IR PERCUTANEOUS ART THROMBECTOMY/INFUSION INTRACRANIAL INC DIAG ANGIO  Result Date: 09/27/2020 INDICATION: New onset left gaze deviation, wrist and hand paresthesias and aphasia. Occluded left middle cerebral artery superior division on CT angiogram of the head and neck. EXAM: 1. EMERGENT LARGE VESSEL OCCLUSION THROMBOLYSIS (anterior CIRCULATION) COMPARISON:  CT angiogram of the head and neck of September 25, 2020. MEDICATIONS: Ancef 2 g IV antibiotic was administered within 1 hour of the procedure. ANESTHESIA/SEDATION: General anesthesia CONTRAST:  Isovue 300 approximately 50 mL FLUOROSCOPY TIME:  Fluoroscopy Time: 28 minutes 30 seconds (1,100 mGy). COMPLICATIONS: None immediate. TECHNIQUE: Following a full explanation of the procedure along with the potential associated complications, an informed witnessed consent was obtained. The risks of intracranial hemorrhage of 10%, worsening neurological deficit, ventilator dependency, death and inability to revascularize were all reviewed in detail with the patient's spouse. The patient was then put under general anesthesia by the Department of Anesthesiology at [facility]. The  right groin was prepped and draped in the usual sterile fashion. Thereafter using modified Seldinger technique, transfemoral access into the right common femoral artery was obtained without difficulty. Over a 0.035 inch  guidewire an 8 French 25 cm Pinnacle sheath was inserted. Through this, and also over a 0.035 inch guidewire a 5 French Simmons 2 support catheter inside of an 087 balloon guide catheter was advanced to the aortic arch region and selectively cannulated in the left common carotid artery to the aortic arch region and selectively positioned in the left common carotid artery at the bifurcation. FINDINGS: The left common carotid arteriogram demonstrates the left external carotid artery and its major branches to be widely patent. The left internal carotid artery at the bulb to the cranial skull base is widely patent. The petrous, the cavernous and the supraclinoid segments are widely patent. The left anterior cerebral artery opacifies into the capillary and venous phases. The left middle cerebral artery opacifies into the capillary and venous phases. Demonstrated is occlusion of the superior division of the left middle cerebral artery in its mid M2 segment. A prominent area of hypoperfusion is demonstrated on the delayed arterial and capillary phases. PROCEDURE: Through the balloon guide catheter, a combination of an 055 136 cm Zoom aspiration catheter inside of which was a 35 160 cm Zoom aspiration Trevo ProVue microcatheter was advanced to the M1 segment of the left middle cerebral artery. The micro guidewire was then gently manipulated and advanced through the occluded superior division projecting medially and then laterally followed by the 35 Zoom aspiration catheter which was buried in the proximal occluded segment. The guidewire was removed. There was no aspiration at the hub of the 35 Zoom aspiration catheter. Constant aspiration was then applied with a 20 mL syringe at the hub of the 35 Zoom  aspiration catheter, and also at the 55 Zoom aspiration catheter with a Penumbra aspiration device. The Zoom 55 had been advanced into the mid superior division. After approximately 2 minutes, the combination of the 35 and the 55 Zoom aspiration catheters was retrieved and removed. Proximal flow arrest was reversed. A control arteriogram performed through the balloon guide catheter now in the distal left internal carotid artery demonstrated minimal change in the occluded distal superior division. A second pass was then made again using the above combination. The 35 Zoom aspiration catheter was advanced just proximal to the occlusion. A gentle control arteriogram performed through this demonstrated a large filling defect just distal to this extending into the M3 segment. An Aristotle 014 inch micro guidewire was then introduced through the 35 Zoom aspiration catheter. The guidewire was advanced more distally into the M3 M4 region followed by the 35 Zoom aspiration catheter in the distal M3 segment. Aspiration was then applied with a 20 mL syringe at the 55 Zoom aspiration catheter hub, and also at the hub of the 55 Zoom aspiration catheter which was then advanced more distally with proximal flow arrest. After approximately 2 minutes, the combination of the 35 Zoom and the 55 Zoom aspiration catheter was retrieved and removed. Flow arrest was reversed. Control arteriogram performed through the balloon guide in the distal left ICA demonstrated now near complete revascularization of the superior division of the left middle cerebral artery. There was occlusion in the distal M4 region of this branch with a focal area of hypoperfusion. A TICI 2C revascularization was achieved. Balloon guide was then retrieved into the left common carotid artery. A control arteriogram performed through this demonstrated patency of the left ICA extra cranially and intracranially. The left anterior cerebral artery remained widely patent. The  left middle cerebral artery distribution demonstrated no evidence of intraluminal filling defects or of occlusions  except for the distal M4 occlusion. The balloon guide was removed. An 8 French Angio-Seal closure device was applied for hemostasis in the right groin puncture site. Distal pulses remained Dopplerable in all fours unchanged. A CT scan of the brain demonstrated no evidence of gross hemorrhage or mass effect or midline shift. A less than 1 cm area of contrast blush was noted in the subcortical mid temporal/parietal region. Patient's general anesthesia was then reversed and the patient was extubated. Upon recovery the patient was able to obey simple instructions appropriately. She had minimal weakness in the right upper extremity with a right facial droop. She exhibited no verbal output. She was then transferred to neuro ICU for post revascularization management. IMPRESSION: Status post near complete revascularization of occluded superior division of the left middle cerebral artery and the mid M2 segment with 2 passes with contact aspiration and proximal aspiration achieving a TICI 2C revascularization. PLAN: Follow-up in the clinic approximately 4 weeks post discharge. Electronically Signed   By: Julieanne Cotton M.D.   On: 09/26/2020 10:28   CT HEAD CODE STROKE WO CONTRAST  Result Date: 09/25/2020 CLINICAL DATA:  Code stroke. Initial evaluation for acute right-sided facial droop, right arm drift. EXAM: CT HEAD WITHOUT CONTRAST TECHNIQUE: Contiguous axial images were obtained from the base of the skull through the vertex without intravenous contrast. COMPARISON:  Prior MRI from 06/04/2020. FINDINGS: Brain: Cerebral volume within normal limits. No acute intracranial hemorrhage. No acute large vessel territory infarct. No mass lesion, midline shift or mass effect. No hydrocephalus or extra-axial fluid collection. Vascular: No hyperdense vessel. Skull: Scalp soft tissues and calvarium within normal  limits. Sinuses/Orbits: Left gaze noted. Globes and orbital soft tissues otherwise unremarkable. Left maxillary sinus retention cyst. Additional scattered mucosal thickening noted within the ethmoidal air cells and maxillary sinuses. Paranasal sinuses are otherwise clear. No mastoid effusion. Other: None. ASPECTS [facility] Stroke Program Early CT Score) - Ganglionic level infarction (caudate, lentiform nuclei, internal capsule, insula, M1-M3 cortex): 7 - Supraganglionic infarction (M4-M6 cortex): 3 Total score (0-10 with 10 being normal): 10 IMPRESSION: 1. Negative head CT.  No acute intracranial abnormality. 2. ASPECTS is 10. These results were communicated to Dr. Wilford Corner at 9:25 pmon 3/24/2022by text page via the [facility] messaging system. Electronically Signed   By: Rise Mu M.D.   On: 09/25/2020 21:30    PHYSICAL EXAM  Temp:  [98.1 F (36.7 C)-98.8 F (37.1 C)] 98.6 F (37 C) (03/26 0400) Pulse Rate:  [61-96] 65 (03/26 0900) Resp:  [11-20] 14 (03/26 0900) BP: (91-113)/(42-73) 98/67 (03/26 0900) SpO2:  [91 %-99 %] 93 % (03/26 0900) Arterial Line BP: (73-154)/(53-71) 128/64 (03/26 0900)  General - Well nourished, well developed, in no apparent distress.  Ophthalmologic - fundi not visualized due to noncooperation.  Cardiovascular - Regular rhythm and rate.  Mental Status -  Awake alert, able to follow simple commands.  However, expressive aphasia, not able to name or repeat.  She is essentially mute  Cranial Nerves II - XII - II - Visual field intact OU. III, IV, VI - Extraocular movements intact. V - Facial sensation intact bilaterally. VII - slight right facial nasolabial fold flattening VIII - Hearing & vestibular intact bilaterally. X - Palate elevates symmetrically. XI - Chin turning & shoulder shrug intact bilaterally. XII - Tongue protrusion intact.  Motor Strength - The patient's strength was normal in all extremities and pronator drift was absent.  Bulk was normal  and fasciculations were absent.   Motor Tone -  Muscle tone was assessed at the neck and appendages and was normal.  Reflexes - The patient's reflexes were symmetrical in all extremities and she had no pathological reflexes.  Sensory - Light touch, temperature/pinprick were assessed and were symmetrical.    Coordination - The patient had normal movements in the hands with no ataxia or dysmetria.  Tremor was absent.  Gait and Station - deferred.   ASSESSMENT/PLAN Ms. Baylea L Spargo is a 50 y.o. 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 TPA given.  Status post thrombectomy with left MCA TICI2c.  Stroke:  left MCA infarct embolic secondary to unclear source  Resultant expressive aphasia  CT head no acute abnormality  CTA of the neck left M2/M3 occlusion  IR left M2 occlusion status post TICI 2c reperfusion  MRI left frontal acute infarct  MRA Interval revascularization of previously identified left MCA branch occlusion, now patent. 2. Otherwise stable and normal intracranial MRA.  2D Echo EF 60 to 65%, atrial septum no residual shunt  Loop recorder interrogation pending  LDL 23, TG 231  HgbA1c 5.4  SCDs for VTE prophylaxis  clopidogrel 75 mg daily prior to admission, now on No antithrombotic within 24 hours TPA.  Anticoagulation with a DOAC is recommended for empiric stroke prevention after her third cryptogenic event.  Given the moderate size infarct, consider weaning after 5 days to minimize risk of hemorrhagic transformation.    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  DAPT ->aspirin only 05/20/2020.CT head and neck negative. EF 50 to 55%. TCD bubble study negative for PFO and LE venous Doppler negative for DVT. Hypercoagulable and autoimmune work-up negative.  Initial TEE concerning for possible IA shunt (previously closed ASD).However, on review from Dr. Excell Seltzer, no significant IA shunt  Cardiac CT 11/19 no CAD or PFO orIA shunt  loop placed 05/20/2020  06/2020 small right frontal lobe infarct, status post TPA, unclear etiology, consider recurrent cryptogenic stroke.  CT no acute abnormality, MRI small right frontal precentral gyrus infarct.  MRA head and neck no LVO.  EF 60 to 65%.  Loop recorder interrogation no A. fib.  Patient declined LP.  Cerebral angiogram negative for vasculitis.  LDL 64, A1c 5.3.  Discharged with DAPT aspirin 81 and Plavix 75.  History of ASD  ASD closure 30 yrs [facility],   small IA shunt seen during TEE 03/2020, but not significantby Cooper   Cardiac CT 05/2020 noIA shunt  Hyperlipidemia  Home meds:lipitor 20  LDL 23, goal < 70  Decrease Lipitor to 10 mg daily given low LDL  Continue statin at discharge  Hashimoto'sthyroiditis  Diagnosed 17 years ago  03/2020, TSH6.13 (H), but FT4 normal, Thyroid peroxidase Ab55 (H), Thyroglobulin Ab- 1.5 (H)  06/2020 TSHnormaland free T4normal  Other Stroke Risk Factors  FormerCigarette smoker  MildETOH use(1/wk), advised to drink no more than 1 drink(s) a day  Obesity,Body mass index is 32.47 kg/m., BMI >/= 30 associated with increased stroke risk, recommend weight loss, diet and exercise as appropriate  Hxmenstrualmigraines  Other Active Problems  Elevated TG 231  Hospital day # 2     Marvel Plan, MD PhD Stroke Neurology 09/27/2020  9:14 AM    To contact Stroke Continuity provider, please refer to [email or web address]. After hours, contact General Neurology

## 2020-09-27 NOTE — Progress Notes (Signed)
Occupational Therapy Treatment Patient Details Name: [name] MRN: [id number] DOB: May 09, 1971 Today's Date: 09/27/2020    History of present illness 50 yo female presenting with R facial droop, R sided weakness, aphasia, and L gaze. Given IV TPA on 3/24. Noncontrast head CT negative for bleed. S/p L revascularization of occluded L MCA. Extubated 3/24. 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   OT comments  Pt making steady progress towards OT goals this session. Pt asleep upon arrival ( therefore pt lethargic throughout session), but agreeable to OT intervention. Session focus on visual scanning tasks and RUE therex. Pt completed x2 table top tasks to facilitate improved functional awareness to R side of environment, pt with noted preference to L side of environment during line bisection task. Additionally worked on [facility] therex with level 1 theraputty with good carryover, issued pt written HEP to increase carryover. Pt would continue to benefit from skilled occupational therapy while admitted and after d/c to address the below listed limitations in order to improve overall functional mobility and facilitate independence with BADL participation. DC plan remains appropriate, will follow acutely per POC.     Follow Up Recommendations  Outpatient OT;Supervision/Assistance - 24 hour    Equipment Recommendations  None recommended by OT    Recommendations for Other Services      Precautions / Restrictions Precautions Precautions: Fall Restrictions Weight Bearing Restrictions: No       Mobility Bed Mobility Overal bed mobility: Independent             General bed mobility comments: session conducted from bed level    Transfers Overall transfer level: Independent Equipment used: None             General transfer comment: session conducted from bed level    Balance     ADL either performed or assessed with clinical judgement   ADL Overall ADL's :  Needs assistance/impaired                                       General ADL Comments: session focus on vision and RUE therex     Vision Baseline Vision/History:  (pts husband reports she needs glasses for distance but does not have any) Patient Visual Report: Other (comment) (suspect R inattention and ? diplopia) Additional Comments: pt completed line bisection with noticeable preference to L side, pt able to complete table top activity where pt instructed to follow the line through all quadrants with no difficulties. husband continues to suspect some diplopia however pt with no awareness to deficit- will continue to assess. issued pt additionally handouts to work on visual deficits   Perception     Praxis      Cognition Arousal/Alertness: Awake/alert;Lethargic (pt asleep upon arrival, very tired during session) Behavior During Therapy: Flat affect Overall Cognitive Status: Difficult to assess                                 General Comments: pt making no efforts to verbalize during session pt did state "good" when asked if she thought she did "good" or "bad" during visual tasks. pt following all commands and nodding appropriately during session        Exercises Hand Exercises Digit Composite Abduction: AROM;Right;Supine;5 reps Digit Composite Adduction: AROM;Right;Supine;5 reps Digit Lifts: AROM;Right;Supine;5 reps Opposition: AROM;Right;Supine;5 reps Other  Exercises Other Exercises: pulling pennies from level 1 putty   Shoulder Instructions       General Comments      Pertinent Vitals/ Pain       Pain Assessment: No/denies pain  Home Living                                          Prior Functioning/Environment              Frequency  Min 2X/week        Progress Toward Goals  OT Goals(current goals can now be found in the care plan section)  Progress towards OT goals: Progressing toward goals  Acute Rehab  OT Goals Patient Stated Goal: none stated Time For Goal Achievement: 10/10/20 Potential to Achieve Goals: Good  Plan Discharge plan remains appropriate;Frequency remains appropriate    Co-evaluation                 AM-PAC OT "6 Clicks" Daily Activity     Outcome Measure   Help from another person eating meals?: A Little Help from another person taking care of personal grooming?: A Little Help from another person toileting, which includes using toliet, bedpan, or urinal?: A Lot Help from another person bathing (including washing, rinsing, drying)?: A Lot Help from another person to put on and taking off regular upper body clothing?: A Lot Help from another person to put on and taking off regular lower body clothing?: A Lot 6 Click Score: 14    End of Session    OT Visit Diagnosis: Unsteadiness on feet (R26.81);Other abnormalities of gait and mobility (R26.89);Muscle weakness (generalized) (M62.81);Hemiplegia and hemiparesis Hemiplegia - Right/Left: Right Hemiplegia - dominant/non-dominant: Non-Dominant   Activity Tolerance Patient tolerated treatment well   Patient Left in bed;with call bell/phone within reach;with family/visitor present   Nurse Communication Mobility status        Time: 7017-7939 OT Time Calculation (min): 23 min  Charges: OT General Charges $OT Visit: 1 Visit OT Treatments $Therapeutic Activity: 23-37 mins  Lenor Derrick., COTA/L Acute Rehabilitation Services [phone]    Barron Schmid 09/27/2020, 4:33 PM

## 2020-09-27 NOTE — Evaluation (Signed)
Physical Therapy Evaluation Patient Details Name: [name] MRN: [id number] DOB: July 04, 1971 Today's Date: 09/27/2020   History of Present Illness  50 yo female presenting with R facial droop, R sided weakness, aphasia, and L gaze. Given IV TPA on 3/24. Noncontrast head CT negative for bleed. S/p L revascularization of occluded L MCA. Extubated 3/24. 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  Clinical Impression  Pt progressing steadily towards her physical therapy goals. Ambulating x 400 feet with no assistive device and negotiated 5 steps without physical difficulty. Scoring 20/24 on the Dynamic Gait Index, indicating she is not at high risk for falls. Pt does display decreased gait speed. Pt main deficit continues to be communication impairment; able to respond accurately to yes/no questions and follow multi step commands. D/c plan remains appropriate.     Follow Up Recommendations Outpatient PT;Supervision for mobility/OOB    Equipment Recommendations  None recommended by PT    Recommendations for Other Services       Precautions / Restrictions Precautions Precautions: Fall Restrictions Weight Bearing Restrictions: No      Mobility  Bed Mobility Overal bed mobility: Independent                  Transfers Overall transfer level: Independent Equipment used: None                Ambulation/Gait Ambulation/Gait assistance: Supervision Gait Distance (Feet): 400 Feet Assistive device: None Gait Pattern/deviations: Step-through pattern;Decreased stride length Gait velocity: decreased   General Gait Details: Slow cautious gait, decreased arm swing, supervision for safety  Stairs Stairs: Yes Stairs assistance: Modified independent (Device/Increase time) Stair Management: One rail Right Number of Stairs: 5 General stair comments: Step over step pattern  Wheelchair Mobility    Modified Rankin (Stroke Patients Only) Modified Rankin  (Stroke Patients Only) Pre-Morbid Rankin Score: No symptoms Modified Rankin: Moderately severe disability     Balance Overall balance assessment: Needs assistance Sitting-balance support: No upper extremity supported;Feet supported Sitting balance-Leahy Scale: Normal     Standing balance support: During functional activity;No upper extremity supported Standing balance-Leahy Scale: Good                   Standardized Balance Assessment Standardized Balance Assessment : Dynamic Gait Index   Dynamic Gait Index Level Surface: Mild Impairment Change in Gait Speed: Moderate Impairment Gait with Horizontal Head Turns: Normal Gait with Vertical Head Turns: Normal Gait and Pivot Turn: Normal Step Over Obstacle: Mild Impairment Step Around Obstacles: Normal Steps: Normal Total Score: 20       Pertinent Vitals/Pain Pain Assessment: No/denies pain    Home Living                        Prior Function                 Hand Dominance        Extremity/Trunk Assessment                Communication      Cognition Arousal/Alertness: Awake/alert Behavior During Therapy: WFL for tasks assessed/performed Overall Cognitive Status: Difficult to assess                                 General Comments: Pt following multi step commands, able to mimic PT phonation with increased time/effort, respond yes/no, but has difficulty with most one or  two syllable words. Expressive > receptive deficits, but difficult to assess awareness and insight into deficits      General Comments      Exercises     Assessment/Plan    PT Assessment    PT Problem List         PT Treatment Interventions      PT Goals (Current goals can be found in the Care Plan section)  Acute Rehab PT Goals Patient Stated Goal: improved speech PT Goal Formulation: With patient Time For Goal Achievement: 10/10/20 Potential to Achieve Goals: Good    Frequency Min  4X/week   Barriers to discharge        Co-evaluation               AM-PAC PT "6 Clicks" Mobility  Outcome Measure Help needed turning from your back to your side while in a flat bed without using bedrails?: None Help needed moving from lying on your back to sitting on the side of a flat bed without using bedrails?: None Help needed moving to and from a bed to a chair (including a wheelchair)?: A Little Help needed standing up from a chair using your arms (e.g., wheelchair or bedside chair)?: None Help needed to walk in hospital room?: A Little Help needed climbing 3-5 steps with a railing? : None 6 Click Score: 22    End of Session Equipment Utilized During Treatment: Gait belt Activity Tolerance: Patient tolerated treatment well Patient left: in chair;with call bell/phone within reach;with chair alarm set Nurse Communication: Mobility status PT Visit Diagnosis: Other abnormalities of gait and mobility (R26.89)    Time: 3570-1779 PT Time Calculation (min) (ACUTE ONLY): 23 min   Charges:     PT Treatments $Therapeutic Activity: 23-37 mins        Lillia Pauls, PT, DPT Acute Rehabilitation Services Pager [phone] Office [phone]   Norval Morton 09/27/2020, 2:16 PM

## 2020-09-27 NOTE — ED Provider Notes (Signed)
[facility] Provider Note   CSN: [id number] Arrival date & time: 09/25/20  2111     History Chief complaint: code stroke  Veatrice L Rosenstock is a 50 y.o. female.  Patient presents as code stroke activation. Around 820 pm pt with acute onset trouble speak/aphasia, and then was noted to have right facial droop and right sided weakness.  Symptoms acute onset, moderate-sev, constant, persistent. EMS was called. CBG 114.  Report of prior cva, on anticoag therapy. No report of trauma/fall.  Pt not verbally responsive/aphasic - level 5 caveat. Pt taken emergently to CT, airway stable for movement to CT.   The history is provided by the patient and the EMS personnel. The history is limited by the condition of the patient.       Past Medical History:  Diagnosis Date  . Hx of migraines   . Stroke [facility])    06/03/20    Patient Active Problem List   Diagnosis Date Noted  . Acute ischemic stroke (HCC) 09/25/2020  . History of ischemic left MCA stroke s/p tPA 06/06/2020  . History of repair of congenital atrial septal defect (ASD) 06/06/2020  . Hyperlipidemia LDL goal <70 06/06/2020  . Received intravenous tissue plasminogen activator (tPA) in emergency department   . Hypothyroid 12/22/2011  . Stress incontinence 12/22/2011  . Family history of kidney disease 12/22/2011  . Routine gynecological examination 12/22/2011  . Routine general medical examination at a health care facility 12/19/2011    Past Surgical History:  Procedure Laterality Date  . BUBBLE STUDY  03/13/2020   Procedure: BUBBLE STUDY;  Surgeon: Quintella Reichert, MD;  Location: [facility] ENDOSCOPY;  Service: Cardiovascular;;  . ENDOMETRIAL ABLATION     Uterine/heavy menses  . IR ANGIO INTRA EXTRACRAN SEL COM CAROTID INNOMINATE BILAT MOD SED  06/06/2020  . IR ANGIO VERTEBRAL SEL VERTEBRAL BILAT MOD SED  06/06/2020  . IR CT HEAD LTD  09/25/2020  . IR PERCUTANEOUS ART THROMBECTOMY/INFUSION INTRACRANIAL INC DIAG ANGIO   09/25/2020  . IR US GUIDE VASC ACCESS RIGHT  06/06/2020  . RADIOLOGY WITH ANESTHESIA N/A 09/25/2020   Procedure: RADIOLOGY WITH ANESTHESIA;  Surgeon: Radiologist, Medication, MD;  Location: [facility] OR;  Service: Radiology;  Laterality: N/A;  . SHOULDER SURGERY     Left,/bone spurs  . TEE WITHOUT CARDIOVERSION N/A 03/13/2020   Procedure: TRANSESOPHAGEAL ECHOCARDIOGRAM (TEE);  Surgeon: Quintella Reichert, MD;  Location: [facility] ENDOSCOPY;  Service: Cardiovascular;  Laterality: N/A;     OB History   No obstetric history on file.     Family History  Problem Relation Age of Onset  . Hypertension Mother   . Hypothyroidism Mother   . High Cholesterol Mother   . Atrial fibrillation Mother   . COPD Father   . Emphysema Father   . Hypothyroidism Sister     Social History   Tobacco Use  . Smoking status: Former Games developer  . Smokeless tobacco: Never Used  Substance Use Topics  . Alcohol use: Yes    Alcohol/week: 1.0 standard drink    Types: 1 Cans of beer per week    Comment: states 1 beer every other day or so   . Drug use: Never    Home Medications Prior to Admission medications   Medication Sig Start Date End Date Taking? Authorizing Provider  atorvastatin (LIPITOR) 20 MG tablet Take 1 tablet (20 mg total) by mouth daily. 07/18/20  Yes Tower, Audrie Gallus, MD  clopidogrel (PLAVIX) 75 MG tablet Take 1  tablet (75 mg total) by mouth daily. 06/07/20  Yes Layne BentonBiby, Sharon L, NP    Allergies    Bee venom  Review of Systems   Review of Systems  Unable to perform ROS: Patient nonverbal  Neurological: Positive for facial asymmetry, speech difficulty and weakness.  level 5 caveat, expressive aphasia/cva.     Physical Exam Updated Vital Signs BP 112/64 (BP Location: Right Arm)   Pulse 65   Temp 97.6 F (36.4 C) (Oral)   Resp 16   Ht 1.651 m (5\' 5" )   Wt 88.5 kg   SpO2 99%   BMI 32.47 kg/m   Physical Exam Vitals and nursing note reviewed.  Constitutional:      Appearance: She is well-developed.   HENT:     Head: Atraumatic.     Nose: Nose normal.     Mouth/Throat:     Mouth: Mucous membranes are moist.  Eyes:     General: No scleral icterus.    Conjunctiva/sclera: Conjunctivae normal.     Pupils: Pupils are equal, round, and reactive to light.     Comments: Gaze to left.   Neck:     Trachea: No tracheal deviation.  Cardiovascular:     Rate and Rhythm: Normal rate and regular rhythm.     Pulses: Normal pulses.     Heart sounds: Normal heart sounds. No murmur heard. No friction rub. No gallop.   Pulmonary:     Effort: Pulmonary effort is normal. No respiratory distress.     Breath sounds: Normal breath sounds.  Abdominal:     General: There is no distension.     Palpations: Abdomen is soft.  Genitourinary:    Comments: No cva tenderness.  Musculoskeletal:        General: No swelling.     Cervical back: Normal range of motion and neck supple. No rigidity. No muscular tenderness.  Skin:    General: Skin is warm and dry.     Findings: No rash.  Neurological:     Mental Status: She is alert.     Comments: Alert.  Expressive aphasia, not verbally responsive. Right facial weakness. Gaze to left. RUE weakness.   Psychiatric:     Comments: Alert, awake.      ED Results / Procedures / Treatments   Labs (all labs ordered are listed, but only abnormal results are displayed) Results for orders placed or performed during the hospital encounter of 09/25/20  Resp Panel by RT-PCR (Flu A&B, Covid) Nasopharyngeal Swab   Specimen: Nasopharyngeal Swab; Nasopharyngeal(NP) swabs in vial transport medium  Result Value Ref Range   SARS Coronavirus 2 by RT PCR NEGATIVE NEGATIVE   Influenza A by PCR NEGATIVE NEGATIVE   Influenza B by PCR NEGATIVE NEGATIVE  MRSA PCR Screening   Specimen: Nasal Mucosa; Nasopharyngeal  Result Value Ref Range   MRSA by PCR NEGATIVE NEGATIVE  Protime-INR  Result Value Ref Range   Prothrombin Time 13.0 11.4 - 15.2 seconds   INR 1.0 0.8 - 1.2  APTT   Result Value Ref Range   aPTT 26 24 - 36 seconds  CBC  Result Value Ref Range   WBC 8.8 4.0 - 10.5 K/uL   RBC 4.10 3.87 - 5.11 MIL/uL   Hemoglobin 13.1 12.0 - 15.0 g/dL   HCT 16.138.9 09.636.0 - 04.546.0 %   MCV 94.9 80.0 - 100.0 fL   MCH 32.0 26.0 - 34.0 pg   MCHC 33.7 30.0 - 36.0 g/dL   RDW  12.4 11.5 - 15.5 %   Platelets 220 150 - 400 K/uL   nRBC 0.0 0.0 - 0.2 %  Differential  Result Value Ref Range   Neutrophils Relative % 55 %   Neutro Abs 4.9 1.7 - 7.7 K/uL   Lymphocytes Relative 28 %   Lymphs Abs 2.5 0.7 - 4.0 K/uL   Monocytes Relative 11 %   Monocytes Absolute 0.9 0.1 - 1.0 K/uL   Eosinophils Relative 5 %   Eosinophils Absolute 0.4 0.0 - 0.5 K/uL   Basophils Relative 1 %   Basophils Absolute 0.1 0.0 - 0.1 K/uL   Immature Granulocytes 0 %   Abs Immature Granulocytes 0.03 0.00 - 0.07 K/uL  Comprehensive metabolic panel  Result Value Ref Range   Sodium 136 135 - 145 mmol/L   Potassium 3.3 (L) 3.5 - 5.1 mmol/L   Chloride 104 98 - 111 mmol/L   CO2 22 22 - 32 mmol/L   Glucose, Bld 121 (H) 70 - 99 mg/dL   BUN 16 6 - 20 mg/dL   Creatinine, Ser 7.10 0.44 - 1.00 mg/dL   Calcium 8.7 (L) 8.9 - 10.3 mg/dL   Total Protein 6.8 6.5 - 8.1 g/dL   Albumin 3.7 3.5 - 5.0 g/dL   AST 25 15 - 41 U/L   ALT 24 0 - 44 U/L   Alkaline Phosphatase 69 38 - 126 U/L   Total Bilirubin 0.5 0.3 - 1.2 mg/dL   GFR, Estimated >62 >69 mL/min   Anion gap 10 5 - 15  Hemoglobin A1c  Result Value Ref Range   Hgb A1c MFr Bld 5.4 4.8 - 5.6 %   Mean Plasma Glucose 108.28 mg/dL  Lipid panel  Result Value Ref Range   Cholesterol 109 0 - 200 mg/dL   Triglycerides 485 (H) <150 mg/dL   HDL 40 (L) >46 mg/dL   Total CHOL/HDL Ratio 2.7 RATIO   VLDL 46 (H) 0 - 40 mg/dL   LDL Cholesterol 23 0 - 99 mg/dL  I-stat chem 8, ED  Result Value Ref Range   Sodium 139 135 - 145 mmol/L   Potassium 3.3 (L) 3.5 - 5.1 mmol/L   Chloride 104 98 - 111 mmol/L   BUN 19 6 - 20 mg/dL   Creatinine, Ser 2.70 0.44 - 1.00 mg/dL    Glucose, Bld 350 (H) 70 - 99 mg/dL   Calcium, Ion 0.93 (L) 1.15 - 1.40 mmol/L   TCO2 23 22 - 32 mmol/L   Hemoglobin 12.6 12.0 - 15.0 g/dL   HCT 81.8 29.9 - 37.1 %  CBG monitoring, ED  Result Value Ref Range   Glucose-Capillary 116 (H) 70 - 99 mg/dL  I-Stat beta hCG blood, ED  Result Value Ref Range   I-stat hCG, quantitative <5.0 <5 mIU/mL   Comment 3          ECHOCARDIOGRAM COMPLETE  Result Value Ref Range   Weight 3,121.71 oz   Height 65 in   BP 110/62 mmHg   S' Lateral 3.10 cm   AR max vel 2.00 cm2   AV Area VTI 1.89 cm2   AV Mean grad 5.0 mmHg   AV Peak grad 9.7 mmHg   Ao pk vel 1.56 m/s   Area-P 1/2 2.22 cm2   AV Area mean vel 2.09 cm2   MV VTI 2.00 cm2   CT ANGIO HEAD W OR WO CONTRAST  Result Date: 09/25/2020 CLINICAL DATA:  Initial evaluation for acute stroke, right-sided facial droop, right  arm drift. EXAM: CT ANGIOGRAPHY HEAD AND NECK TECHNIQUE: Multidetector CT imaging of the head and neck was performed using the standard protocol during bolus administration of intravenous contrast. Multiplanar CT image reconstructions and MIPs were obtained to evaluate the vascular anatomy. Carotid stenosis measurements (when applicable) are obtained utilizing NASCET criteria, using the distal internal carotid diameter as the denominator. CONTRAST:  75mL OMNIPAQUE IOHEXOL 350 MG/ML SOLN COMPARISON:  Prior CT from earlier the same day as well as multiple previous exams. FINDINGS: CTA NECK FINDINGS Aortic arch: Visualized aortic arch normal in caliber with normal branch pattern. No stenosis about the origin of the great vessels. Right carotid system: Right common and internal carotid arteries widely patent without stenosis, dissection or occlusion. Left carotid system: Left common and internal carotid arteries widely patent without stenosis, dissection or occlusion. Vertebral arteries: Both vertebral arteries arise from the subclavian arteries. Strongly dominant right vertebral artery with a  diffusely hypoplastic left vertebral artery. Vertebral arteries widely patent within the neck without stenosis, dissection or occlusion. Skeleton: No visible acute osseous finding. No discrete or worrisome osseous lesions. Mild to moderate cervical spondylosis at C5-6 and C6-7. Other neck: No other acute soft tissue abnormality within the neck. No mass or adenopathy. Upper chest: Visualized upper chest demonstrates no acute finding. Surgical clips noted about the anterior mediastinum. Review of the MIP images confirms the above findings CTA HEAD FINDINGS Anterior circulation: Both internal carotid arteries widely patent to the termini without stenosis. A1 segments widely patent. Normal anterior communicating artery complex. Anterior cerebral arteries widely patent to their distal aspects. No M1 stenosis or occlusion. Normal MCA bifurcations. On the left, there is acute occlusion of a left M3 branch, superior division (series 7, image 95). Remainder of the left MCA branches are perfused. Distal right MCA branches well perfused as well. Posterior circulation: Dominant right V4 segment widely patent to the vertebrobasilar junction. Hypoplastic left vertebral artery terminates in PICA. Both PICA origins patent and normal. Basilar patent to its distal aspect without stenosis. Superior cerebellar arteries patent bilaterally. Both PCAs primarily supplied via the basilar well perfused to their distal aspects. Venous sinuses: Patent. Anatomic variants: Hypoplastic left vertebral artery terminates in PICA. No aneurysm. Review of the MIP images confirms the above findings IMPRESSION: 1. Acute left M3 branch occlusion, superior division. 2. Otherwise negative CTA of the head and neck. No other large vessel occlusion, hemodynamically significant stenosis, or other acute vascular abnormality. Critical Value/emergent results were discussed by telephone at the time of interpretation on 09/25/2020 at 9:25 pm with provider ASHISH  ARORA. Electronically Signed   By: Rise Mu M.D.   On: 09/25/2020 21:45   CT ANGIO NECK W OR WO CONTRAST  Result Date: 09/25/2020 CLINICAL DATA:  Initial evaluation for acute stroke, right-sided facial droop, right arm drift. EXAM: CT ANGIOGRAPHY HEAD AND NECK TECHNIQUE: Multidetector CT imaging of the head and neck was performed using the standard protocol during bolus administration of intravenous contrast. Multiplanar CT image reconstructions and MIPs were obtained to evaluate the vascular anatomy. Carotid stenosis measurements (when applicable) are obtained utilizing NASCET criteria, using the distal internal carotid diameter as the denominator. CONTRAST:  75mL OMNIPAQUE IOHEXOL 350 MG/ML SOLN COMPARISON:  Prior CT from earlier the same day as well as multiple previous exams. FINDINGS: CTA NECK FINDINGS Aortic arch: Visualized aortic arch normal in caliber with normal branch pattern. No stenosis about the origin of the great vessels. Right carotid system: Right common and internal carotid arteries widely patent without  stenosis, dissection or occlusion. Left carotid system: Left common and internal carotid arteries widely patent without stenosis, dissection or occlusion. Vertebral arteries: Both vertebral arteries arise from the subclavian arteries. Strongly dominant right vertebral artery with a diffusely hypoplastic left vertebral artery. Vertebral arteries widely patent within the neck without stenosis, dissection or occlusion. Skeleton: No visible acute osseous finding. No discrete or worrisome osseous lesions. Mild to moderate cervical spondylosis at C5-6 and C6-7. Other neck: No other acute soft tissue abnormality within the neck. No mass or adenopathy. Upper chest: Visualized upper chest demonstrates no acute finding. Surgical clips noted about the anterior mediastinum. Review of the MIP images confirms the above findings CTA HEAD FINDINGS Anterior circulation: Both internal carotid  arteries widely patent to the termini without stenosis. A1 segments widely patent. Normal anterior communicating artery complex. Anterior cerebral arteries widely patent to their distal aspects. No M1 stenosis or occlusion. Normal MCA bifurcations. On the left, there is acute occlusion of a left M3 branch, superior division (series 7, image 95). Remainder of the left MCA branches are perfused. Distal right MCA branches well perfused as well. Posterior circulation: Dominant right V4 segment widely patent to the vertebrobasilar junction. Hypoplastic left vertebral artery terminates in PICA. Both PICA origins patent and normal. Basilar patent to its distal aspect without stenosis. Superior cerebellar arteries patent bilaterally. Both PCAs primarily supplied via the basilar well perfused to their distal aspects. Venous sinuses: Patent. Anatomic variants: Hypoplastic left vertebral artery terminates in PICA. No aneurysm. Review of the MIP images confirms the above findings IMPRESSION: 1. Acute left M3 branch occlusion, superior division. 2. Otherwise negative CTA of the head and neck. No other large vessel occlusion, hemodynamically significant stenosis, or other acute vascular abnormality. Critical Value/emergent results were discussed by telephone at the time of interpretation on 09/25/2020 at 9:25 pm with provider ASHISH ARORA. Electronically Signed   By: Rise Mu M.D.   On: 09/25/2020 21:45   MR ANGIO HEAD WO CONTRAST  Result Date: 09/26/2020 CLINICAL DATA:  Follow-up examination for acute stroke, previous MCA branch occlusion, status post tPA and catheter directed revascularization. EXAM: MRI HEAD WITHOUT CONTRAST MRA HEAD WITHOUT CONTRAST TECHNIQUE: Multiplanar, multiecho pulse sequences of the brain and surrounding structures were obtained without intravenous contrast. Angiographic images of the head were obtained using MRA technique without contrast. COMPARISON:  Prior studies from 09/25/2020  FINDINGS: MRI HEAD FINDINGS Brain: Cerebral volume within normal limits for age. Minimal scattered T2/FLAIR hyperintensity noted within the periventricular white matter, most likely related chronic microvascular ischemic disease, minimal for age. Area of confluent restricted diffusion seen involving the anterior left frontal region with mild involvement of the underlying anterior left insular cortex, consistent with acute left MCA distribution infarct (series 3, image 32). Associated early gyral swelling and edema within the area of infarction without significant regional mass effect. Subcentimeter focus of susceptibility artifact at the left frontal operculum/external capsule likely reflects a small focus of petechial hemorrhage (series 8, image 13). No frank malignant hemorrhagic transformation. No other evidence for acute or subacute ischemia. Gray-white matter differentiation otherwise maintained. No other areas of chronic cortical infarction. No other evidence for acute or chronic intracranial hemorrhage. No mass lesion, midline shift or mass effect. No hydrocephalus or extra-axial fluid collection. Pituitary gland suprasellar region normal. Midline structures intact. Vascular: Major intracranial vascular flow voids are well maintained. Skull and upper cervical spine: Craniocervical junction within normal limits. Bone marrow signal intensity normal. No focal marrow replacing lesion. No scalp soft tissue abnormality.  Sinuses/Orbits: Globes and orbital soft tissues within normal limits. Left maxillary sinus retention cysts noted. Additional mild mucosal thickening elsewhere within the paranasal sinuses. No mastoid effusion. Inner ear structures within normal limits. Other: None. MRA HEAD FINDINGS ANTERIOR CIRCULATION: Visualized distal cervical segments of the internal carotid arteries are patent with antegrade flow. Petrous, cavernous, and supraclinoid segments widely patent without stenosis or other  abnormality. A1 segments widely patent. Normal anterior communicating artery complex. Anterior cerebral arteries widely patent to their distal aspects. No M1 stenosis or occlusion. Normal MCA bifurcations. Interval revascularization of previously identified left MCA branch occlusion. MCA branches now well perfused and patent bilaterally. POSTERIOR CIRCULATION: Dominant right vertebral artery widely patent to the vertebrobasilar junction. Left vertebral artery markedly hypoplastic and terminates in PICA. Both PICA patent. Basilar patent to its distal aspect without stenosis. Superior cerebellar arteries patent bilaterally. Both PCAs primarily supplied via the basilar well perfused to their distal aspects. No intracranial aneurysm. IMPRESSION: MRI HEAD IMPRESSION: 1. Acute ischemic left MCA distribution infarct as above. Subcentimeter focus of susceptibility artifact consistent with a small focus of petechial hemorrhage. No frank malignant hemorrhagic transformation or significant mass effect. 2. Otherwise essentially normal brain MRI for age. MRA HEAD IMPRESSION: 1. Interval revascularization of previously identified left MCA branch occlusion, now patent. 2. Otherwise stable and normal intracranial MRA. Electronically Signed   By: Rise Mu M.D.   On: 09/26/2020 23:04   MR BRAIN WO CONTRAST  Result Date: 09/26/2020 CLINICAL DATA:  Follow-up examination for acute stroke, previous MCA branch occlusion, status post tPA and catheter directed revascularization. EXAM: MRI HEAD WITHOUT CONTRAST MRA HEAD WITHOUT CONTRAST TECHNIQUE: Multiplanar, multiecho pulse sequences of the brain and surrounding structures were obtained without intravenous contrast. Angiographic images of the head were obtained using MRA technique without contrast. COMPARISON:  Prior studies from 09/25/2020 FINDINGS: MRI HEAD FINDINGS Brain: Cerebral volume within normal limits for age. Minimal scattered T2/FLAIR hyperintensity noted within  the periventricular white matter, most likely related chronic microvascular ischemic disease, minimal for age. Area of confluent restricted diffusion seen involving the anterior left frontal region with mild involvement of the underlying anterior left insular cortex, consistent with acute left MCA distribution infarct (series 3, image 32). Associated early gyral swelling and edema within the area of infarction without significant regional mass effect. Subcentimeter focus of susceptibility artifact at the left frontal operculum/external capsule likely reflects a small focus of petechial hemorrhage (series 8, image 13). No frank malignant hemorrhagic transformation. No other evidence for acute or subacute ischemia. Gray-white matter differentiation otherwise maintained. No other areas of chronic cortical infarction. No other evidence for acute or chronic intracranial hemorrhage. No mass lesion, midline shift or mass effect. No hydrocephalus or extra-axial fluid collection. Pituitary gland suprasellar region normal. Midline structures intact. Vascular: Major intracranial vascular flow voids are well maintained. Skull and upper cervical spine: Craniocervical junction within normal limits. Bone marrow signal intensity normal. No focal marrow replacing lesion. No scalp soft tissue abnormality. Sinuses/Orbits: Globes and orbital soft tissues within normal limits. Left maxillary sinus retention cysts noted. Additional mild mucosal thickening elsewhere within the paranasal sinuses. No mastoid effusion. Inner ear structures within normal limits. Other: None. MRA HEAD FINDINGS ANTERIOR CIRCULATION: Visualized distal cervical segments of the internal carotid arteries are patent with antegrade flow. Petrous, cavernous, and supraclinoid segments widely patent without stenosis or other abnormality. A1 segments widely patent. Normal anterior communicating artery complex. Anterior cerebral arteries widely patent to their distal  aspects. No M1 stenosis or occlusion. Normal  MCA bifurcations. Interval revascularization of previously identified left MCA branch occlusion. MCA branches now well perfused and patent bilaterally. POSTERIOR CIRCULATION: Dominant right vertebral artery widely patent to the vertebrobasilar junction. Left vertebral artery markedly hypoplastic and terminates in PICA. Both PICA patent. Basilar patent to its distal aspect without stenosis. Superior cerebellar arteries patent bilaterally. Both PCAs primarily supplied via the basilar well perfused to their distal aspects. No intracranial aneurysm. IMPRESSION: MRI HEAD IMPRESSION: 1. Acute ischemic left MCA distribution infarct as above. Subcentimeter focus of susceptibility artifact consistent with a small focus of petechial hemorrhage. No frank malignant hemorrhagic transformation or significant mass effect. 2. Otherwise essentially normal brain MRI for age. MRA HEAD IMPRESSION: 1. Interval revascularization of previously identified left MCA branch occlusion, now patent. 2. Otherwise stable and normal intracranial MRA. Electronically Signed   By: Rise Mu M.D.   On: 09/26/2020 23:04   IR CT Head Ltd  Result Date: 09/27/2020 INDICATION: New onset left gaze deviation, wrist and hand paresthesias and aphasia. Occluded left middle cerebral artery superior division on CT angiogram of the head and neck. EXAM: 1. EMERGENT LARGE VESSEL OCCLUSION THROMBOLYSIS (anterior CIRCULATION) COMPARISON:  CT angiogram of the head and neck of September 25, 2020. MEDICATIONS: Ancef 2 g IV antibiotic was administered within 1 hour of the procedure. ANESTHESIA/SEDATION: General anesthesia CONTRAST:  Isovue 300 approximately 50 mL FLUOROSCOPY TIME:  Fluoroscopy Time: 28 minutes 30 seconds (1,100 mGy). COMPLICATIONS: None immediate. TECHNIQUE: Following a full explanation of the procedure along with the potential associated complications, an informed witnessed consent was obtained. The  risks of intracranial hemorrhage of 10%, worsening neurological deficit, ventilator dependency, death and inability to revascularize were all reviewed in detail with the patient's spouse. The patient was then put under general anesthesia by the Department of Anesthesiology at [facility]. The right groin was prepped and draped in the usual sterile fashion. Thereafter using modified Seldinger technique, transfemoral access into the right common femoral artery was obtained without difficulty. Over a 0.035 inch guidewire an 8 French 25 cm Pinnacle sheath was inserted. Through this, and also over a 0.035 inch guidewire a 5 French Simmons 2 support catheter inside of an 087 balloon guide catheter was advanced to the aortic arch region and selectively cannulated in the left common carotid artery to the aortic arch region and selectively positioned in the left common carotid artery at the bifurcation. FINDINGS: The left common carotid arteriogram demonstrates the left external carotid artery and its major branches to be widely patent. The left internal carotid artery at the bulb to the cranial skull base is widely patent. The petrous, the cavernous and the supraclinoid segments are widely patent. The left anterior cerebral artery opacifies into the capillary and venous phases. The left middle cerebral artery opacifies into the capillary and venous phases. Demonstrated is occlusion of the superior division of the left middle cerebral artery in its mid M2 segment. A prominent area of hypoperfusion is demonstrated on the delayed arterial and capillary phases. PROCEDURE: Through the balloon guide catheter, a combination of an 055 136 cm Zoom aspiration catheter inside of which was a 35 160 cm Zoom aspiration Trevo ProVue microcatheter was advanced to the M1 segment of the left middle cerebral artery. The micro guidewire was then gently manipulated and advanced through the occluded superior division projecting medially  and then laterally followed by the 35 Zoom aspiration catheter which was buried in the proximal occluded segment. The guidewire was removed. There was no aspiration at the  hub of the 35 Zoom aspiration catheter. Constant aspiration was then applied with a 20 mL syringe at the hub of the 35 Zoom aspiration catheter, and also at the 55 Zoom aspiration catheter with a Penumbra aspiration device. The Zoom 55 had been advanced into the mid superior division. After approximately 2 minutes, the combination of the 35 and the 55 Zoom aspiration catheters was retrieved and removed. Proximal flow arrest was reversed. A control arteriogram performed through the balloon guide catheter now in the distal left internal carotid artery demonstrated minimal change in the occluded distal superior division. A second pass was then made again using the above combination. The 35 Zoom aspiration catheter was advanced just proximal to the occlusion. A gentle control arteriogram performed through this demonstrated a large filling defect just distal to this extending into the M3 segment. An Aristotle 014 inch micro guidewire was then introduced through the 35 Zoom aspiration catheter. The guidewire was advanced more distally into the M3 M4 region followed by the 35 Zoom aspiration catheter in the distal M3 segment. Aspiration was then applied with a 20 mL syringe at the 55 Zoom aspiration catheter hub, and also at the hub of the 55 Zoom aspiration catheter which was then advanced more distally with proximal flow arrest. After approximately 2 minutes, the combination of the 35 Zoom and the 55 Zoom aspiration catheter was retrieved and removed. Flow arrest was reversed. Control arteriogram performed through the balloon guide in the distal left ICA demonstrated now near complete revascularization of the superior division of the left middle cerebral artery. There was occlusion in the distal M4 region of this branch with a focal area of  hypoperfusion. A TICI 2C revascularization was achieved. Balloon guide was then retrieved into the left common carotid artery. A control arteriogram performed through this demonstrated patency of the left ICA extra cranially and intracranially. The left anterior cerebral artery remained widely patent. The left middle cerebral artery distribution demonstrated no evidence of intraluminal filling defects or of occlusions except for the distal M4 occlusion. The balloon guide was removed. An 8 French Angio-Seal closure device was applied for hemostasis in the right groin puncture site. Distal pulses remained Dopplerable in all fours unchanged. A CT scan of the brain demonstrated no evidence of gross hemorrhage or mass effect or midline shift. A less than 1 cm area of contrast blush was noted in the subcortical mid temporal/parietal region. Patient's general anesthesia was then reversed and the patient was extubated. Upon recovery the patient was able to obey simple instructions appropriately. She had minimal weakness in the right upper extremity with a right facial droop. She exhibited no verbal output. She was then transferred to neuro ICU for post revascularization management. IMPRESSION: Status post near complete revascularization of occluded superior division of the left middle cerebral artery and the mid M2 segment with 2 passes with contact aspiration and proximal aspiration achieving a TICI 2C revascularization. PLAN: Follow-up in the clinic approximately 4 weeks post discharge. Electronically Signed   By: Julieanne Cotton M.D.   On: 09/26/2020 10:28   ECHOCARDIOGRAM COMPLETE  Result Date: 09/26/2020    ECHOCARDIOGRAM REPORT   Patient Name:   Jeronda L Degracia Date of Exam: 09/26/2020 Medical Rec #:  [id number]      Height:       65.0 in Accession #:    [id number]     Weight:       195.1 lb Date of Birth:  [birth year]  BSA:          1.957 m Patient Age:    49 years       BP:           110/62 mmHg Patient  Gender: F              HR:           74 bpm. Exam Location:  Inpatient Procedure: 2D Echo, Cardiac Doppler and Color Doppler Indications:    Stroke  History:        Patient has prior history of Echocardiogram examinations, most                 recent 06/03/2020. Risk Factors:Former Smoker. H/O PFO closure                 30 years ago.  Sonographer:    Ross Ludwig RDCS (AE) Referring Phys: [id number] ASHISH ARORA IMPRESSIONS  1. Left ventricular ejection fraction, by estimation, is 60 to 65%. The left ventricle has normal function. The left ventricle has no regional wall motion abnormalities. Left ventricular diastolic parameters were normal.  2. Right ventricular systolic function is normal. The right ventricular size is normal. There is normal pulmonary artery systolic pressure.  3. The atrial septum appears intact, without residual shunt, without a visible closure device (surgical closure?). It tends to bow right to left, but there are no other signs of high right atrial pressure.  4. The mitral valve is normal in structure. No evidence of mitral valve regurgitation. No evidence of mitral stenosis.  5. The aortic valve is normal in structure. Aortic valve regurgitation is not visualized. No aortic stenosis is present.  6. The inferior vena cava is normal in size with greater than 50% respiratory variability, suggesting right atrial pressure of 3 mmHg. Comparison(s): A prior study was performed on 03/13/2020. The small saline contrast shunt seen on TEE was not demonstrated on the current study. FINDINGS  Left Ventricle: Left ventricular ejection fraction, by estimation, is 60 to 65%. The left ventricle has normal function. The left ventricle has no regional wall motion abnormalities. The left ventricular internal cavity size was normal in size. There is  no left ventricular hypertrophy. Left ventricular diastolic parameters were normal. Indeterminate filling pressures. Right Ventricle: The right ventricular size is  normal. No increase in right ventricular wall thickness. Right ventricular systolic function is normal. There is normal pulmonary artery systolic pressure. The tricuspid regurgitant velocity is 2.37 m/s, and  with an assumed right atrial pressure of 8 mmHg, the estimated right ventricular systolic pressure is 30.5 mmHg. Left Atrium: Left atrial size was normal in size. Right Atrium: Right atrial size was normal in size. Pericardium: There is no evidence of pericardial effusion. Mitral Valve: The mitral valve is normal in structure. No evidence of mitral valve regurgitation. No evidence of mitral valve stenosis. MV peak gradient, 3.1 mmHg. The mean mitral valve gradient is 1.0 mmHg. Tricuspid Valve: The tricuspid valve is normal in structure. Tricuspid valve regurgitation is not demonstrated. No evidence of tricuspid stenosis. Aortic Valve: The aortic valve is normal in structure. Aortic valve regurgitation is not visualized. No aortic stenosis is present. Aortic valve mean gradient measures 5.0 mmHg. Aortic valve peak gradient measures 9.7 mmHg. Aortic valve area, by VTI measures 1.89 cm. Pulmonic Valve: The pulmonic valve was normal in structure. Pulmonic valve regurgitation is not visualized. No evidence of pulmonic stenosis. Aorta: The aortic root is normal in size and structure. Venous: The  inferior vena cava is normal in size with greater than 50% respiratory variability, suggesting right atrial pressure of 3 mmHg. IAS/Shunts: No atrial level shunt detected by color flow Doppler.  LEFT VENTRICLE PLAX 2D LVIDd:         4.20 cm  Diastology LVIDs:         3.10 cm  LV e' medial:    5.63 cm/s LV PW:         1.00 cm  LV E/e' medial:  16.2 LV IVS:        1.00 cm  LV e' lateral:   8.25 cm/s LVOT diam:     1.90 cm  LV E/e' lateral: 11.1 LV SV:         61 LV SV Index:   31 LVOT Area:     2.84 cm  RIGHT VENTRICLE             IVC RV Basal diam:  2.60 cm     IVC diam: 1.60 cm RV S prime:     10.10 cm/s TAPSE (M-mode): 2.5  cm LEFT ATRIUM             Index       RIGHT ATRIUM           Index LA diam:        2.60 cm 1.33 cm/m  RA Area:     13.10 cm LA Vol (A2C):   47.0 ml 24.01 ml/m RA Volume:   26.20 ml  13.38 ml/m LA Vol (A4C):   44.3 ml 22.63 ml/m LA Biplane Vol: 48.5 ml 24.78 ml/m  AORTIC VALVE AV Area (Vmax):    2.00 cm AV Area (Vmean):   2.09 cm AV Area (VTI):     1.89 cm AV Vmax:           156.00 cm/s AV Vmean:          95.700 cm/s AV VTI:            0.323 m AV Peak Grad:      9.7 mmHg AV Mean Grad:      5.0 mmHg LVOT Vmax:         110.00 cm/s LVOT Vmean:        70.600 cm/s LVOT VTI:          0.215 m LVOT/AV VTI ratio: 0.67  AORTA Ao Root diam: 3.00 cm Ao Asc diam:  2.80 cm MITRAL VALVE               TRICUSPID VALVE MV Area (PHT): 2.22 cm    TR Peak grad:   22.5 mmHg MV Area VTI:   2.00 cm    TR Vmax:        237.00 cm/s MV Peak grad:  3.1 mmHg MV Mean grad:  1.0 mmHg    SHUNTS MV Vmax:       0.87 m/s    Systemic VTI:  0.22 m MV Vmean:      51.4 cm/s   Systemic Diam: 1.90 cm MV Decel Time: 342 msec MV E velocity: 91.30 cm/s MV A velocity: 74.60 cm/s MV E/A ratio:  1.22 Mihai Croitoru MD Electronically signed by Thurmon Fair MD Signature Date/Time: 09/26/2020/1:53:17 PM    Final    IR PERCUTANEOUS ART THROMBECTOMY/INFUSION INTRACRANIAL INC DIAG ANGIO  Result Date: 09/27/2020 INDICATION: New onset left gaze deviation, wrist and hand paresthesias and aphasia. Occluded left middle cerebral artery superior division on CT angiogram of the head and  neck. EXAM: 1. EMERGENT LARGE VESSEL OCCLUSION THROMBOLYSIS (anterior CIRCULATION) COMPARISON:  CT angiogram of the head and neck of September 25, 2020. MEDICATIONS: Ancef 2 g IV antibiotic was administered within 1 hour of the procedure. ANESTHESIA/SEDATION: General anesthesia CONTRAST:  Isovue 300 approximately 50 mL FLUOROSCOPY TIME:  Fluoroscopy Time: 28 minutes 30 seconds (1,100 mGy). COMPLICATIONS: None immediate. TECHNIQUE: Following a full explanation of the procedure along  with the potential associated complications, an informed witnessed consent was obtained. The risks of intracranial hemorrhage of 10%, worsening neurological deficit, ventilator dependency, death and inability to revascularize were all reviewed in detail with the patient's spouse. The patient was then put under general anesthesia by the Department of Anesthesiology at [facility]. The right groin was prepped and draped in the usual sterile fashion. Thereafter using modified Seldinger technique, transfemoral access into the right common femoral artery was obtained without difficulty. Over a 0.035 inch guidewire an 8 French 25 cm Pinnacle sheath was inserted. Through this, and also over a 0.035 inch guidewire a 5 French Simmons 2 support catheter inside of an 087 balloon guide catheter was advanced to the aortic arch region and selectively cannulated in the left common carotid artery to the aortic arch region and selectively positioned in the left common carotid artery at the bifurcation. FINDINGS: The left common carotid arteriogram demonstrates the left external carotid artery and its major branches to be widely patent. The left internal carotid artery at the bulb to the cranial skull base is widely patent. The petrous, the cavernous and the supraclinoid segments are widely patent. The left anterior cerebral artery opacifies into the capillary and venous phases. The left middle cerebral artery opacifies into the capillary and venous phases. Demonstrated is occlusion of the superior division of the left middle cerebral artery in its mid M2 segment. A prominent area of hypoperfusion is demonstrated on the delayed arterial and capillary phases. PROCEDURE: Through the balloon guide catheter, a combination of an 055 136 cm Zoom aspiration catheter inside of which was a 35 160 cm Zoom aspiration Trevo ProVue microcatheter was advanced to the M1 segment of the left middle cerebral artery. The micro guidewire was  then gently manipulated and advanced through the occluded superior division projecting medially and then laterally followed by the 35 Zoom aspiration catheter which was buried in the proximal occluded segment. The guidewire was removed. There was no aspiration at the hub of the 35 Zoom aspiration catheter. Constant aspiration was then applied with a 20 mL syringe at the hub of the 35 Zoom aspiration catheter, and also at the 55 Zoom aspiration catheter with a Penumbra aspiration device. The Zoom 55 had been advanced into the mid superior division. After approximately 2 minutes, the combination of the 35 and the 55 Zoom aspiration catheters was retrieved and removed. Proximal flow arrest was reversed. A control arteriogram performed through the balloon guide catheter now in the distal left internal carotid artery demonstrated minimal change in the occluded distal superior division. A second pass was then made again using the above combination. The 35 Zoom aspiration catheter was advanced just proximal to the occlusion. A gentle control arteriogram performed through this demonstrated a large filling defect just distal to this extending into the M3 segment. An Aristotle 014 inch micro guidewire was then introduced through the 35 Zoom aspiration catheter. The guidewire was advanced more distally into the M3 M4 region followed by the 35 Zoom aspiration catheter in the distal M3 segment. Aspiration was then applied with  a 20 mL syringe at the 55 Zoom aspiration catheter hub, and also at the hub of the 55 Zoom aspiration catheter which was then advanced more distally with proximal flow arrest. After approximately 2 minutes, the combination of the 35 Zoom and the 55 Zoom aspiration catheter was retrieved and removed. Flow arrest was reversed. Control arteriogram performed through the balloon guide in the distal left ICA demonstrated now near complete revascularization of the superior division of the left middle cerebral  artery. There was occlusion in the distal M4 region of this branch with a focal area of hypoperfusion. A TICI 2C revascularization was achieved. Balloon guide was then retrieved into the left common carotid artery. A control arteriogram performed through this demonstrated patency of the left ICA extra cranially and intracranially. The left anterior cerebral artery remained widely patent. The left middle cerebral artery distribution demonstrated no evidence of intraluminal filling defects or of occlusions except for the distal M4 occlusion. The balloon guide was removed. An 8 French Angio-Seal closure device was applied for hemostasis in the right groin puncture site. Distal pulses remained Dopplerable in all fours unchanged. A CT scan of the brain demonstrated no evidence of gross hemorrhage or mass effect or midline shift. A less than 1 cm area of contrast blush was noted in the subcortical mid temporal/parietal region. Patient's general anesthesia was then reversed and the patient was extubated. Upon recovery the patient was able to obey simple instructions appropriately. She had minimal weakness in the right upper extremity with a right facial droop. She exhibited no verbal output. She was then transferred to neuro ICU for post revascularization management. IMPRESSION: Status post near complete revascularization of occluded superior division of the left middle cerebral artery and the mid M2 segment with 2 passes with contact aspiration and proximal aspiration achieving a TICI 2C revascularization. PLAN: Follow-up in the clinic approximately 4 weeks post discharge. Electronically Signed   By: Julieanne Cotton M.D.   On: 09/26/2020 10:28   CT HEAD CODE STROKE WO CONTRAST  Result Date: 09/25/2020 CLINICAL DATA:  Code stroke. Initial evaluation for acute right-sided facial droop, right arm drift. EXAM: CT HEAD WITHOUT CONTRAST TECHNIQUE: Contiguous axial images were obtained from the base of the skull through  the vertex without intravenous contrast. COMPARISON:  Prior MRI from 06/04/2020. FINDINGS: Brain: Cerebral volume within normal limits. No acute intracranial hemorrhage. No acute large vessel territory infarct. No mass lesion, midline shift or mass effect. No hydrocephalus or extra-axial fluid collection. Vascular: No hyperdense vessel. Skull: Scalp soft tissues and calvarium within normal limits. Sinuses/Orbits: Left gaze noted. Globes and orbital soft tissues otherwise unremarkable. Left maxillary sinus retention cyst. Additional scattered mucosal thickening noted within the ethmoidal air cells and maxillary sinuses. Paranasal sinuses are otherwise clear. No mastoid effusion. Other: None. ASPECTS [facility] Stroke Program Early CT Score) - Ganglionic level infarction (caudate, lentiform nuclei, internal capsule, insula, M1-M3 cortex): 7 - Supraganglionic infarction (M4-M6 cortex): 3 Total score (0-10 with 10 being normal): 10 IMPRESSION: 1. Negative head CT.  No acute intracranial abnormality. 2. ASPECTS is 10. These results were communicated to Dr. Wilford Corner at 9:25 pmon 3/24/2022by text page via the [facility] messaging system. Electronically Signed   By: Rise Mu M.D.   On: 09/25/2020 21:30    EKG None  Radiology CT ANGIO HEAD W OR WO CONTRAST  Result Date: 09/25/2020 CLINICAL DATA:  Initial evaluation for acute stroke, right-sided facial droop, right arm drift. EXAM: CT ANGIOGRAPHY HEAD AND NECK TECHNIQUE: Multidetector CT  imaging of the head and neck was performed using the standard protocol during bolus administration of intravenous contrast. Multiplanar CT image reconstructions and MIPs were obtained to evaluate the vascular anatomy. Carotid stenosis measurements (when applicable) are obtained utilizing NASCET criteria, using the distal internal carotid diameter as the denominator. CONTRAST:  75mL OMNIPAQUE IOHEXOL 350 MG/ML SOLN COMPARISON:  Prior CT from earlier the same day as well as multiple  previous exams. FINDINGS: CTA NECK FINDINGS Aortic arch: Visualized aortic arch normal in caliber with normal branch pattern. No stenosis about the origin of the great vessels. Right carotid system: Right common and internal carotid arteries widely patent without stenosis, dissection or occlusion. Left carotid system: Left common and internal carotid arteries widely patent without stenosis, dissection or occlusion. Vertebral arteries: Both vertebral arteries arise from the subclavian arteries. Strongly dominant right vertebral artery with a diffusely hypoplastic left vertebral artery. Vertebral arteries widely patent within the neck without stenosis, dissection or occlusion. Skeleton: No visible acute osseous finding. No discrete or worrisome osseous lesions. Mild to moderate cervical spondylosis at C5-6 and C6-7. Other neck: No other acute soft tissue abnormality within the neck. No mass or adenopathy. Upper chest: Visualized upper chest demonstrates no acute finding. Surgical clips noted about the anterior mediastinum. Review of the MIP images confirms the above findings CTA HEAD FINDINGS Anterior circulation: Both internal carotid arteries widely patent to the termini without stenosis. A1 segments widely patent. Normal anterior communicating artery complex. Anterior cerebral arteries widely patent to their distal aspects. No M1 stenosis or occlusion. Normal MCA bifurcations. On the left, there is acute occlusion of a left M3 branch, superior division (series 7, image 95). Remainder of the left MCA branches are perfused. Distal right MCA branches well perfused as well. Posterior circulation: Dominant right V4 segment widely patent to the vertebrobasilar junction. Hypoplastic left vertebral artery terminates in PICA. Both PICA origins patent and normal. Basilar patent to its distal aspect without stenosis. Superior cerebellar arteries patent bilaterally. Both PCAs primarily supplied via the basilar well perfused to  their distal aspects. Venous sinuses: Patent. Anatomic variants: Hypoplastic left vertebral artery terminates in PICA. No aneurysm. Review of the MIP images confirms the above findings IMPRESSION: 1. Acute left M3 branch occlusion, superior division. 2. Otherwise negative CTA of the head and neck. No other large vessel occlusion, hemodynamically significant stenosis, or other acute vascular abnormality. Critical Value/emergent results were discussed by telephone at the time of interpretation on 09/25/2020 at 9:25 pm with provider ASHISH ARORA. Electronically Signed   By: Rise Mu M.D.   On: 09/25/2020 21:45   CT ANGIO NECK W OR WO CONTRAST  Result Date: 09/25/2020 CLINICAL DATA:  Initial evaluation for acute stroke, right-sided facial droop, right arm drift. EXAM: CT ANGIOGRAPHY HEAD AND NECK TECHNIQUE: Multidetector CT imaging of the head and neck was performed using the standard protocol during bolus administration of intravenous contrast. Multiplanar CT image reconstructions and MIPs were obtained to evaluate the vascular anatomy. Carotid stenosis measurements (when applicable) are obtained utilizing NASCET criteria, using the distal internal carotid diameter as the denominator. CONTRAST:  75mL OMNIPAQUE IOHEXOL 350 MG/ML SOLN COMPARISON:  Prior CT from earlier the same day as well as multiple previous exams. FINDINGS: CTA NECK FINDINGS Aortic arch: Visualized aortic arch normal in caliber with normal branch pattern. No stenosis about the origin of the great vessels. Right carotid system: Right common and internal carotid arteries widely patent without stenosis, dissection or occlusion. Left carotid system: Left common and internal  carotid arteries widely patent without stenosis, dissection or occlusion. Vertebral arteries: Both vertebral arteries arise from the subclavian arteries. Strongly dominant right vertebral artery with a diffusely hypoplastic left vertebral artery. Vertebral arteries  widely patent within the neck without stenosis, dissection or occlusion. Skeleton: No visible acute osseous finding. No discrete or worrisome osseous lesions. Mild to moderate cervical spondylosis at C5-6 and C6-7. Other neck: No other acute soft tissue abnormality within the neck. No mass or adenopathy. Upper chest: Visualized upper chest demonstrates no acute finding. Surgical clips noted about the anterior mediastinum. Review of the MIP images confirms the above findings CTA HEAD FINDINGS Anterior circulation: Both internal carotid arteries widely patent to the termini without stenosis. A1 segments widely patent. Normal anterior communicating artery complex. Anterior cerebral arteries widely patent to their distal aspects. No M1 stenosis or occlusion. Normal MCA bifurcations. On the left, there is acute occlusion of a left M3 branch, superior division (series 7, image 95). Remainder of the left MCA branches are perfused. Distal right MCA branches well perfused as well. Posterior circulation: Dominant right V4 segment widely patent to the vertebrobasilar junction. Hypoplastic left vertebral artery terminates in PICA. Both PICA origins patent and normal. Basilar patent to its distal aspect without stenosis. Superior cerebellar arteries patent bilaterally. Both PCAs primarily supplied via the basilar well perfused to their distal aspects. Venous sinuses: Patent. Anatomic variants: Hypoplastic left vertebral artery terminates in PICA. No aneurysm. Review of the MIP images confirms the above findings IMPRESSION: 1. Acute left M3 branch occlusion, superior division. 2. Otherwise negative CTA of the head and neck. No other large vessel occlusion, hemodynamically significant stenosis, or other acute vascular abnormality. Critical Value/emergent results were discussed by telephone at the time of interpretation on 09/25/2020 at 9:25 pm with provider ASHISH ARORA. Electronically Signed   By: Rise Mu M.D.   On:  09/25/2020 21:45   MR ANGIO HEAD WO CONTRAST  Result Date: 09/26/2020 CLINICAL DATA:  Follow-up examination for acute stroke, previous MCA branch occlusion, status post tPA and catheter directed revascularization. EXAM: MRI HEAD WITHOUT CONTRAST MRA HEAD WITHOUT CONTRAST TECHNIQUE: Multiplanar, multiecho pulse sequences of the brain and surrounding structures were obtained without intravenous contrast. Angiographic images of the head were obtained using MRA technique without contrast. COMPARISON:  Prior studies from 09/25/2020 FINDINGS: MRI HEAD FINDINGS Brain: Cerebral volume within normal limits for age. Minimal scattered T2/FLAIR hyperintensity noted within the periventricular white matter, most likely related chronic microvascular ischemic disease, minimal for age. Area of confluent restricted diffusion seen involving the anterior left frontal region with mild involvement of the underlying anterior left insular cortex, consistent with acute left MCA distribution infarct (series 3, image 32). Associated early gyral swelling and edema within the area of infarction without significant regional mass effect. Subcentimeter focus of susceptibility artifact at the left frontal operculum/external capsule likely reflects a small focus of petechial hemorrhage (series 8, image 13). No frank malignant hemorrhagic transformation. No other evidence for acute or subacute ischemia. Gray-white matter differentiation otherwise maintained. No other areas of chronic cortical infarction. No other evidence for acute or chronic intracranial hemorrhage. No mass lesion, midline shift or mass effect. No hydrocephalus or extra-axial fluid collection. Pituitary gland suprasellar region normal. Midline structures intact. Vascular: Major intracranial vascular flow voids are well maintained. Skull and upper cervical spine: Craniocervical junction within normal limits. Bone marrow signal intensity normal. No focal marrow replacing lesion.  No scalp soft tissue abnormality. Sinuses/Orbits: Globes and orbital soft tissues within normal limits. Left  maxillary sinus retention cysts noted. Additional mild mucosal thickening elsewhere within the paranasal sinuses. No mastoid effusion. Inner ear structures within normal limits. Other: None. MRA HEAD FINDINGS ANTERIOR CIRCULATION: Visualized distal cervical segments of the internal carotid arteries are patent with antegrade flow. Petrous, cavernous, and supraclinoid segments widely patent without stenosis or other abnormality. A1 segments widely patent. Normal anterior communicating artery complex. Anterior cerebral arteries widely patent to their distal aspects. No M1 stenosis or occlusion. Normal MCA bifurcations. Interval revascularization of previously identified left MCA branch occlusion. MCA branches now well perfused and patent bilaterally. POSTERIOR CIRCULATION: Dominant right vertebral artery widely patent to the vertebrobasilar junction. Left vertebral artery markedly hypoplastic and terminates in PICA. Both PICA patent. Basilar patent to its distal aspect without stenosis. Superior cerebellar arteries patent bilaterally. Both PCAs primarily supplied via the basilar well perfused to their distal aspects. No intracranial aneurysm. IMPRESSION: MRI HEAD IMPRESSION: 1. Acute ischemic left MCA distribution infarct as above. Subcentimeter focus of susceptibility artifact consistent with a small focus of petechial hemorrhage. No frank malignant hemorrhagic transformation or significant mass effect. 2. Otherwise essentially normal brain MRI for age. MRA HEAD IMPRESSION: 1. Interval revascularization of previously identified left MCA branch occlusion, now patent. 2. Otherwise stable and normal intracranial MRA. Electronically Signed   By: Rise Mu M.D.   On: 09/26/2020 23:04   MR BRAIN WO CONTRAST  Result Date: 09/26/2020 CLINICAL DATA:  Follow-up examination for acute stroke, previous MCA  branch occlusion, status post tPA and catheter directed revascularization. EXAM: MRI HEAD WITHOUT CONTRAST MRA HEAD WITHOUT CONTRAST TECHNIQUE: Multiplanar, multiecho pulse sequences of the brain and surrounding structures were obtained without intravenous contrast. Angiographic images of the head were obtained using MRA technique without contrast. COMPARISON:  Prior studies from 09/25/2020 FINDINGS: MRI HEAD FINDINGS Brain: Cerebral volume within normal limits for age. Minimal scattered T2/FLAIR hyperintensity noted within the periventricular white matter, most likely related chronic microvascular ischemic disease, minimal for age. Area of confluent restricted diffusion seen involving the anterior left frontal region with mild involvement of the underlying anterior left insular cortex, consistent with acute left MCA distribution infarct (series 3, image 32). Associated early gyral swelling and edema within the area of infarction without significant regional mass effect. Subcentimeter focus of susceptibility artifact at the left frontal operculum/external capsule likely reflects a small focus of petechial hemorrhage (series 8, image 13). No frank malignant hemorrhagic transformation. No other evidence for acute or subacute ischemia. Gray-white matter differentiation otherwise maintained. No other areas of chronic cortical infarction. No other evidence for acute or chronic intracranial hemorrhage. No mass lesion, midline shift or mass effect. No hydrocephalus or extra-axial fluid collection. Pituitary gland suprasellar region normal. Midline structures intact. Vascular: Major intracranial vascular flow voids are well maintained. Skull and upper cervical spine: Craniocervical junction within normal limits. Bone marrow signal intensity normal. No focal marrow replacing lesion. No scalp soft tissue abnormality. Sinuses/Orbits: Globes and orbital soft tissues within normal limits. Left maxillary sinus retention cysts  noted. Additional mild mucosal thickening elsewhere within the paranasal sinuses. No mastoid effusion. Inner ear structures within normal limits. Other: None. MRA HEAD FINDINGS ANTERIOR CIRCULATION: Visualized distal cervical segments of the internal carotid arteries are patent with antegrade flow. Petrous, cavernous, and supraclinoid segments widely patent without stenosis or other abnormality. A1 segments widely patent. Normal anterior communicating artery complex. Anterior cerebral arteries widely patent to their distal aspects. No M1 stenosis or occlusion. Normal MCA bifurcations. Interval revascularization of previously identified left MCA branch occlusion.  MCA branches now well perfused and patent bilaterally. POSTERIOR CIRCULATION: Dominant right vertebral artery widely patent to the vertebrobasilar junction. Left vertebral artery markedly hypoplastic and terminates in PICA. Both PICA patent. Basilar patent to its distal aspect without stenosis. Superior cerebellar arteries patent bilaterally. Both PCAs primarily supplied via the basilar well perfused to their distal aspects. No intracranial aneurysm. IMPRESSION: MRI HEAD IMPRESSION: 1. Acute ischemic left MCA distribution infarct as above. Subcentimeter focus of susceptibility artifact consistent with a small focus of petechial hemorrhage. No frank malignant hemorrhagic transformation or significant mass effect. 2. Otherwise essentially normal brain MRI for age. MRA HEAD IMPRESSION: 1. Interval revascularization of previously identified left MCA branch occlusion, now patent. 2. Otherwise stable and normal intracranial MRA. Electronically Signed   By: Rise Mu M.D.   On: 09/26/2020 23:04   IR CT Head Ltd  Result Date: 09/27/2020 INDICATION: New onset left gaze deviation, wrist and hand paresthesias and aphasia. Occluded left middle cerebral artery superior division on CT angiogram of the head and neck. EXAM: 1. EMERGENT LARGE VESSEL OCCLUSION  THROMBOLYSIS (anterior CIRCULATION) COMPARISON:  CT angiogram of the head and neck of September 25, 2020. MEDICATIONS: Ancef 2 g IV antibiotic was administered within 1 hour of the procedure. ANESTHESIA/SEDATION: General anesthesia CONTRAST:  Isovue 300 approximately 50 mL FLUOROSCOPY TIME:  Fluoroscopy Time: 28 minutes 30 seconds (1,100 mGy). COMPLICATIONS: None immediate. TECHNIQUE: Following a full explanation of the procedure along with the potential associated complications, an informed witnessed consent was obtained. The risks of intracranial hemorrhage of 10%, worsening neurological deficit, ventilator dependency, death and inability to revascularize were all reviewed in detail with the patient's spouse. The patient was then put under general anesthesia by the Department of Anesthesiology at [facility]. The right groin was prepped and draped in the usual sterile fashion. Thereafter using modified Seldinger technique, transfemoral access into the right common femoral artery was obtained without difficulty. Over a 0.035 inch guidewire an 8 French 25 cm Pinnacle sheath was inserted. Through this, and also over a 0.035 inch guidewire a 5 French Simmons 2 support catheter inside of an 087 balloon guide catheter was advanced to the aortic arch region and selectively cannulated in the left common carotid artery to the aortic arch region and selectively positioned in the left common carotid artery at the bifurcation. FINDINGS: The left common carotid arteriogram demonstrates the left external carotid artery and its major branches to be widely patent. The left internal carotid artery at the bulb to the cranial skull base is widely patent. The petrous, the cavernous and the supraclinoid segments are widely patent. The left anterior cerebral artery opacifies into the capillary and venous phases. The left middle cerebral artery opacifies into the capillary and venous phases. Demonstrated is occlusion of the superior  division of the left middle cerebral artery in its mid M2 segment. A prominent area of hypoperfusion is demonstrated on the delayed arterial and capillary phases. PROCEDURE: Through the balloon guide catheter, a combination of an 055 136 cm Zoom aspiration catheter inside of which was a 35 160 cm Zoom aspiration Trevo ProVue microcatheter was advanced to the M1 segment of the left middle cerebral artery. The micro guidewire was then gently manipulated and advanced through the occluded superior division projecting medially and then laterally followed by the 35 Zoom aspiration catheter which was buried in the proximal occluded segment. The guidewire was removed. There was no aspiration at the hub of the 35 Zoom aspiration catheter. Constant aspiration was then  applied with a 20 mL syringe at the hub of the 35 Zoom aspiration catheter, and also at the 55 Zoom aspiration catheter with a Penumbra aspiration device. The Zoom 55 had been advanced into the mid superior division. After approximately 2 minutes, the combination of the 35 and the 55 Zoom aspiration catheters was retrieved and removed. Proximal flow arrest was reversed. A control arteriogram performed through the balloon guide catheter now in the distal left internal carotid artery demonstrated minimal change in the occluded distal superior division. A second pass was then made again using the above combination. The 35 Zoom aspiration catheter was advanced just proximal to the occlusion. A gentle control arteriogram performed through this demonstrated a large filling defect just distal to this extending into the M3 segment. An Aristotle 014 inch micro guidewire was then introduced through the 35 Zoom aspiration catheter. The guidewire was advanced more distally into the M3 M4 region followed by the 35 Zoom aspiration catheter in the distal M3 segment. Aspiration was then applied with a 20 mL syringe at the 55 Zoom aspiration catheter hub, and also at the hub of  the 55 Zoom aspiration catheter which was then advanced more distally with proximal flow arrest. After approximately 2 minutes, the combination of the 35 Zoom and the 55 Zoom aspiration catheter was retrieved and removed. Flow arrest was reversed. Control arteriogram performed through the balloon guide in the distal left ICA demonstrated now near complete revascularization of the superior division of the left middle cerebral artery. There was occlusion in the distal M4 region of this branch with a focal area of hypoperfusion. A TICI 2C revascularization was achieved. Balloon guide was then retrieved into the left common carotid artery. A control arteriogram performed through this demonstrated patency of the left ICA extra cranially and intracranially. The left anterior cerebral artery remained widely patent. The left middle cerebral artery distribution demonstrated no evidence of intraluminal filling defects or of occlusions except for the distal M4 occlusion. The balloon guide was removed. An 8 French Angio-Seal closure device was applied for hemostasis in the right groin puncture site. Distal pulses remained Dopplerable in all fours unchanged. A CT scan of the brain demonstrated no evidence of gross hemorrhage or mass effect or midline shift. A less than 1 cm area of contrast blush was noted in the subcortical mid temporal/parietal region. Patient's general anesthesia was then reversed and the patient was extubated. Upon recovery the patient was able to obey simple instructions appropriately. She had minimal weakness in the right upper extremity with a right facial droop. She exhibited no verbal output. She was then transferred to neuro ICU for post revascularization management. IMPRESSION: Status post near complete revascularization of occluded superior division of the left middle cerebral artery and the mid M2 segment with 2 passes with contact aspiration and proximal aspiration achieving a TICI 2C  revascularization. PLAN: Follow-up in the clinic approximately 4 weeks post discharge. Electronically Signed   By: Julieanne Cotton M.D.   On: 09/26/2020 10:28   ECHOCARDIOGRAM COMPLETE  Result Date: 09/26/2020    ECHOCARDIOGRAM REPORT   Patient Name:   Munirah L Redditt Date of Exam: 09/26/2020 Medical Rec #:  [id number]      Height:       65.0 in Accession #:    [id number]     Weight:       195.1 lb Date of Birth:  [birth year]      BSA:  1.957 m Patient Age:    49 years       BP:           110/62 mmHg Patient Gender: F              HR:           74 bpm. Exam Location:  Inpatient Procedure: 2D Echo, Cardiac Doppler and Color Doppler Indications:    Stroke  History:        Patient has prior history of Echocardiogram examinations, most                 recent 06/03/2020. Risk Factors:Former Smoker. H/O PFO closure                 30 years ago.  Sonographer:    Ross Ludwig RDCS (AE) Referring Phys: [id number] ASHISH ARORA IMPRESSIONS  1. Left ventricular ejection fraction, by estimation, is 60 to 65%. The left ventricle has normal function. The left ventricle has no regional wall motion abnormalities. Left ventricular diastolic parameters were normal.  2. Right ventricular systolic function is normal. The right ventricular size is normal. There is normal pulmonary artery systolic pressure.  3. The atrial septum appears intact, without residual shunt, without a visible closure device (surgical closure?). It tends to bow right to left, but there are no other signs of high right atrial pressure.  4. The mitral valve is normal in structure. No evidence of mitral valve regurgitation. No evidence of mitral stenosis.  5. The aortic valve is normal in structure. Aortic valve regurgitation is not visualized. No aortic stenosis is present.  6. The inferior vena cava is normal in size with greater than 50% respiratory variability, suggesting right atrial pressure of 3 mmHg. Comparison(s): A prior study was performed on  03/13/2020. The small saline contrast shunt seen on TEE was not demonstrated on the current study. FINDINGS  Left Ventricle: Left ventricular ejection fraction, by estimation, is 60 to 65%. The left ventricle has normal function. The left ventricle has no regional wall motion abnormalities. The left ventricular internal cavity size was normal in size. There is  no left ventricular hypertrophy. Left ventricular diastolic parameters were normal. Indeterminate filling pressures. Right Ventricle: The right ventricular size is normal. No increase in right ventricular wall thickness. Right ventricular systolic function is normal. There is normal pulmonary artery systolic pressure. The tricuspid regurgitant velocity is 2.37 m/s, and  with an assumed right atrial pressure of 8 mmHg, the estimated right ventricular systolic pressure is 30.5 mmHg. Left Atrium: Left atrial size was normal in size. Right Atrium: Right atrial size was normal in size. Pericardium: There is no evidence of pericardial effusion. Mitral Valve: The mitral valve is normal in structure. No evidence of mitral valve regurgitation. No evidence of mitral valve stenosis. MV peak gradient, 3.1 mmHg. The mean mitral valve gradient is 1.0 mmHg. Tricuspid Valve: The tricuspid valve is normal in structure. Tricuspid valve regurgitation is not demonstrated. No evidence of tricuspid stenosis. Aortic Valve: The aortic valve is normal in structure. Aortic valve regurgitation is not visualized. No aortic stenosis is present. Aortic valve mean gradient measures 5.0 mmHg. Aortic valve peak gradient measures 9.7 mmHg. Aortic valve area, by VTI measures 1.89 cm. Pulmonic Valve: The pulmonic valve was normal in structure. Pulmonic valve regurgitation is not visualized. No evidence of pulmonic stenosis. Aorta: The aortic root is normal in size and structure. Venous: The inferior vena cava is normal in size with greater than 50%  respiratory variability, suggesting right  atrial pressure of 3 mmHg. IAS/Shunts: No atrial level shunt detected by color flow Doppler.  LEFT VENTRICLE PLAX 2D LVIDd:         4.20 cm  Diastology LVIDs:         3.10 cm  LV e' medial:    5.63 cm/s LV PW:         1.00 cm  LV E/e' medial:  16.2 LV IVS:        1.00 cm  LV e' lateral:   8.25 cm/s LVOT diam:     1.90 cm  LV E/e' lateral: 11.1 LV SV:         61 LV SV Index:   31 LVOT Area:     2.84 cm  RIGHT VENTRICLE             IVC RV Basal diam:  2.60 cm     IVC diam: 1.60 cm RV S prime:     10.10 cm/s TAPSE (M-mode): 2.5 cm LEFT ATRIUM             Index       RIGHT ATRIUM           Index LA diam:        2.60 cm 1.33 cm/m  RA Area:     13.10 cm LA Vol (A2C):   47.0 ml 24.01 ml/m RA Volume:   26.20 ml  13.38 ml/m LA Vol (A4C):   44.3 ml 22.63 ml/m LA Biplane Vol: 48.5 ml 24.78 ml/m  AORTIC VALVE AV Area (Vmax):    2.00 cm AV Area (Vmean):   2.09 cm AV Area (VTI):     1.89 cm AV Vmax:           156.00 cm/s AV Vmean:          95.700 cm/s AV VTI:            0.323 m AV Peak Grad:      9.7 mmHg AV Mean Grad:      5.0 mmHg LVOT Vmax:         110.00 cm/s LVOT Vmean:        70.600 cm/s LVOT VTI:          0.215 m LVOT/AV VTI ratio: 0.67  AORTA Ao Root diam: 3.00 cm Ao Asc diam:  2.80 cm MITRAL VALVE               TRICUSPID VALVE MV Area (PHT): 2.22 cm    TR Peak grad:   22.5 mmHg MV Area VTI:   2.00 cm    TR Vmax:        237.00 cm/s MV Peak grad:  3.1 mmHg MV Mean grad:  1.0 mmHg    SHUNTS MV Vmax:       0.87 m/s    Systemic VTI:  0.22 m MV Vmean:      51.4 cm/s   Systemic Diam: 1.90 cm MV Decel Time: 342 msec MV E velocity: 91.30 cm/s MV A velocity: 74.60 cm/s MV E/A ratio:  1.22 Mihai Croitoru MD Electronically signed by Thurmon Fair MD Signature Date/Time: 09/26/2020/1:53:17 PM    Final    IR PERCUTANEOUS ART THROMBECTOMY/INFUSION INTRACRANIAL INC DIAG ANGIO  Result Date: 09/27/2020 INDICATION: New onset left gaze deviation, wrist and hand paresthesias and aphasia. Occluded left middle cerebral artery  superior division on CT angiogram of the head and neck. EXAM: 1. EMERGENT LARGE VESSEL OCCLUSION THROMBOLYSIS (anterior CIRCULATION)  COMPARISON:  CT angiogram of the head and neck of September 25, 2020. MEDICATIONS: Ancef 2 g IV antibiotic was administered within 1 hour of the procedure. ANESTHESIA/SEDATION: General anesthesia CONTRAST:  Isovue 300 approximately 50 mL FLUOROSCOPY TIME:  Fluoroscopy Time: 28 minutes 30 seconds (1,100 mGy). COMPLICATIONS: None immediate. TECHNIQUE: Following a full explanation of the procedure along with the potential associated complications, an informed witnessed consent was obtained. The risks of intracranial hemorrhage of 10%, worsening neurological deficit, ventilator dependency, death and inability to revascularize were all reviewed in detail with the patient's spouse. The patient was then put under general anesthesia by the Department of Anesthesiology at [facility]. The right groin was prepped and draped in the usual sterile fashion. Thereafter using modified Seldinger technique, transfemoral access into the right common femoral artery was obtained without difficulty. Over a 0.035 inch guidewire an 8 French 25 cm Pinnacle sheath was inserted. Through this, and also over a 0.035 inch guidewire a 5 French Simmons 2 support catheter inside of an 087 balloon guide catheter was advanced to the aortic arch region and selectively cannulated in the left common carotid artery to the aortic arch region and selectively positioned in the left common carotid artery at the bifurcation. FINDINGS: The left common carotid arteriogram demonstrates the left external carotid artery and its major branches to be widely patent. The left internal carotid artery at the bulb to the cranial skull base is widely patent. The petrous, the cavernous and the supraclinoid segments are widely patent. The left anterior cerebral artery opacifies into the capillary and venous phases. The left middle cerebral  artery opacifies into the capillary and venous phases. Demonstrated is occlusion of the superior division of the left middle cerebral artery in its mid M2 segment. A prominent area of hypoperfusion is demonstrated on the delayed arterial and capillary phases. PROCEDURE: Through the balloon guide catheter, a combination of an 055 136 cm Zoom aspiration catheter inside of which was a 35 160 cm Zoom aspiration Trevo ProVue microcatheter was advanced to the M1 segment of the left middle cerebral artery. The micro guidewire was then gently manipulated and advanced through the occluded superior division projecting medially and then laterally followed by the 35 Zoom aspiration catheter which was buried in the proximal occluded segment. The guidewire was removed. There was no aspiration at the hub of the 35 Zoom aspiration catheter. Constant aspiration was then applied with a 20 mL syringe at the hub of the 35 Zoom aspiration catheter, and also at the 55 Zoom aspiration catheter with a Penumbra aspiration device. The Zoom 55 had been advanced into the mid superior division. After approximately 2 minutes, the combination of the 35 and the 55 Zoom aspiration catheters was retrieved and removed. Proximal flow arrest was reversed. A control arteriogram performed through the balloon guide catheter now in the distal left internal carotid artery demonstrated minimal change in the occluded distal superior division. A second pass was then made again using the above combination. The 35 Zoom aspiration catheter was advanced just proximal to the occlusion. A gentle control arteriogram performed through this demonstrated a large filling defect just distal to this extending into the M3 segment. An Aristotle 014 inch micro guidewire was then introduced through the 35 Zoom aspiration catheter. The guidewire was advanced more distally into the M3 M4 region followed by the 35 Zoom aspiration catheter in the distal M3 segment. Aspiration was  then applied with a 20 mL syringe at the 55 Zoom aspiration catheter  hub, and also at the hub of the 55 Zoom aspiration catheter which was then advanced more distally with proximal flow arrest. After approximately 2 minutes, the combination of the 35 Zoom and the 55 Zoom aspiration catheter was retrieved and removed. Flow arrest was reversed. Control arteriogram performed through the balloon guide in the distal left ICA demonstrated now near complete revascularization of the superior division of the left middle cerebral artery. There was occlusion in the distal M4 region of this branch with a focal area of hypoperfusion. A TICI 2C revascularization was achieved. Balloon guide was then retrieved into the left common carotid artery. A control arteriogram performed through this demonstrated patency of the left ICA extra cranially and intracranially. The left anterior cerebral artery remained widely patent. The left middle cerebral artery distribution demonstrated no evidence of intraluminal filling defects or of occlusions except for the distal M4 occlusion. The balloon guide was removed. An 8 French Angio-Seal closure device was applied for hemostasis in the right groin puncture site. Distal pulses remained Dopplerable in all fours unchanged. A CT scan of the brain demonstrated no evidence of gross hemorrhage or mass effect or midline shift. A less than 1 cm area of contrast blush was noted in the subcortical mid temporal/parietal region. Patient's general anesthesia was then reversed and the patient was extubated. Upon recovery the patient was able to obey simple instructions appropriately. She had minimal weakness in the right upper extremity with a right facial droop. She exhibited no verbal output. She was then transferred to neuro ICU for post revascularization management. IMPRESSION: Status post near complete revascularization of occluded superior division of the left middle cerebral artery and the mid M2  segment with 2 passes with contact aspiration and proximal aspiration achieving a TICI 2C revascularization. PLAN: Follow-up in the clinic approximately 4 weeks post discharge. Electronically Signed   By: Julieanne Cotton M.D.   On: 09/26/2020 10:28   CT HEAD CODE STROKE WO CONTRAST  Result Date: 09/25/2020 CLINICAL DATA:  Code stroke. Initial evaluation for acute right-sided facial droop, right arm drift. EXAM: CT HEAD WITHOUT CONTRAST TECHNIQUE: Contiguous axial images were obtained from the base of the skull through the vertex without intravenous contrast. COMPARISON:  Prior MRI from 06/04/2020. FINDINGS: Brain: Cerebral volume within normal limits. No acute intracranial hemorrhage. No acute large vessel territory infarct. No mass lesion, midline shift or mass effect. No hydrocephalus or extra-axial fluid collection. Vascular: No hyperdense vessel. Skull: Scalp soft tissues and calvarium within normal limits. Sinuses/Orbits: Left gaze noted. Globes and orbital soft tissues otherwise unremarkable. Left maxillary sinus retention cyst. Additional scattered mucosal thickening noted within the ethmoidal air cells and maxillary sinuses. Paranasal sinuses are otherwise clear. No mastoid effusion. Other: None. ASPECTS [facility] Stroke Program Early CT Score) - Ganglionic level infarction (caudate, lentiform nuclei, internal capsule, insula, M1-M3 cortex): 7 - Supraganglionic infarction (M4-M6 cortex): 3 Total score (0-10 with 10 being normal): 10 IMPRESSION: 1. Negative head CT.  No acute intracranial abnormality. 2. ASPECTS is 10. These results were communicated to Dr. Wilford Corner at 9:25 pmon 3/24/2022by text page via the [facility] messaging system. Electronically Signed   By: Rise Mu M.D.   On: 09/25/2020 21:30    Procedures Procedures   Medications Ordered in ED Medications  0.9 %  sodium chloride infusion ( Intravenous New Bag/Given 09/27/20 1338)  acetaminophen (TYLENOL) tablet 650 mg (650 mg Oral  Given 09/27/20 1354)    Or  acetaminophen (TYLENOL) 160 MG/5ML solution 650 mg ( Per  Tube See Alternative 09/27/20 1354)    Or  acetaminophen (TYLENOL) suppository 650 mg ( Rectal See Alternative 09/27/20 1354)  senna-docusate (Senokot-S) tablet 1 tablet (has no administration in time range)  labetalol (NORMODYNE) injection 20 mg (20 mg Intravenous Not Given 09/26/20 0327)    And  clevidipine (CLEVIPREX) infusion 0.5 mg/mL (0 mg/hr Intravenous Stopped 09/26/20 1530)  Chlorhexidine Gluconate Cloth 2 % PADS 6 each (6 each Topical Not Given 09/27/20 1338)  Resource ThickenUp Clear (has no administration in time range)  atorvastatin (LIPITOR) tablet 10 mg (10 mg Oral Given 09/27/20 0900)  pantoprazole (PROTONIX) EC tablet 40 mg (40 mg Oral Given 09/27/20 0900)  aspirin EC tablet 81 mg (81 mg Oral Given 09/27/20 0900)  sodium chloride flush (NS) 0.9 % injection 3 mL (3 mLs Intravenous Given 09/26/20 0326)  alteplase (ACTIVASE) 1 mg/mL infusion 79.7 mg (0 mg Intravenous Stopped 09/25/20 2221)    Followed by  0.9 %  sodium chloride infusion (50 mLs Intravenous New Bag/Given 09/25/20 2230)  iohexol (OMNIPAQUE) 350 MG/ML injection 75 mL (75 mLs Intravenous Contrast Given 09/25/20 2128)  fentaNYL (SUBLIMAZE) 100 MCG/2ML injection (  Override pull for Anesthesia 09/25/20 2215)   stroke: mapping our early stages of recovery book ( Does not apply Given 09/26/20 0326)  ceFAZolin (ANCEF) 2-4 GM/100ML-% IVPB (  Duplicate 09/26/20 0325)  nitroGLYCERIN 100 mcg/mL intra-arterial injection (  Duplicate 09/26/20 0326)  iohexol (OMNIPAQUE) 300 MG/ML solution 50 mL (25 mLs Intra-arterial Contrast Given 09/25/20 2319)  iohexol (OMNIPAQUE) 300 MG/ML solution 150 mL (25 mLs Intra-arterial Contrast Given 09/25/20 2320)  potassium chloride 10 mEq in 100 mL IVPB ( Intravenous Stopped 09/26/20 0529)  fentaNYL (SUBLIMAZE) 100 MCG/2ML injection (  Override pull for Anesthesia 09/25/20 2309)    ED Course  I have reviewed the triage vital  signs and the nursing notes.  Pertinent labs & imaging results that were available during my care of the patient were reviewed by me and considered in my medical decision making (see chart for details).    MDM Rules/Calculators/A&P                         Iv ns. Continuous pulse ox and cardiac monitoring. Stat labs. Stat imaging. Code stroke activation. Neurology emergently consulted and at bedside.   Reviewed nursing notes and prior charts for additional history.   Labs reviewed/interpreted by me -  Wbc normal, hgb normal.  CT/CTA reviewed/interpreted by me - left cva.   Neurology administered emergent tpa/emergent IR, followed by admission to neuro icu.   CRITICAL CARE Performed by: Suzi Roots Total critical care time: 35 minutes Critical care time was exclusive of separately billable procedures and treating other patients. Critical care was necessary to treat or prevent imminent or life-threatening deterioration. Critical care was time spent personally by me on the following activities: development of treatment plan with patient and/or surrogate as well as nursing, discussions with consultants, evaluation of patient's response to treatment, examination of patient, obtaining history from patient or surrogate, ordering and performing treatments and interventions, ordering and review of laboratory studies, ordering and review of radiographic studies, pulse oximetry and re-evaluation of patient's condition.    Final Clinical Impression(s) / ED Diagnoses Final diagnoses:  Acute ischemic stroke (HCC)    Rx / DC Orders ED Discharge Orders         Ordered    IR Radiologist Eval & Mgmt       Comments: Follow  up visit with Dr. Corliss Skains in 4-6 weeks.  Our office will call you to set up the appointment.   09/26/20 1610           Cathren Laine, MD 09/27/20 1500

## 2020-09-28 NOTE — Progress Notes (Signed)
STROKE TEAM PROGRESS NOTE   SUBJECTIVE (INTERVAL HISTORY) Her husband and the daughter are at at the bedside.  Overall her condition is gradually improving.  Still has expressive aphasia, however follow simple commands, moving all extremities.  Symptoms much improved from last night, status post TPA and thrombectomy.  They had several questions regarding anticoagulation.  I believe the going thought is that anticoagulation is recommended in the form of DAOC either Xarelto or Eliquis.  They had questions about repeating a bubble study although I do not think this will change treatment long-term.  Further discussion will be held tomorrow with Dr. Pearlean Brownie.  The patient has a history of episodic headaches usually bifrontal but has had the left temporal frontal headache in the hospital especially yesterday.  She did use home nurtec which usually works well and worked well today.  She currently is headache free.    OBJECTIVE Temp:  [97.6 F (36.4 C)-98.6 F (37 C)] 98.2 F (36.8 C) (03/27 0315) Pulse Rate:  [65-79] 79 (03/27 0315) Cardiac Rhythm: Normal sinus rhythm (03/26 1900) Resp:  [16-18] 18 (03/27 0315) BP: (112-120)/(56-64) 112/60 (03/27 0315) SpO2:  [92 %-99 %] 92 % (03/27 0315)  Recent Labs  Lab 09/25/20 2112  GLUCAP 116*   Recent Labs  Lab 09/25/20 2118  NA 136  139  K 3.3*  3.3*  CL 104  104  CO2 22  GLUCOSE 121*  120*  BUN 16  19  CREATININE 0.71  0.70  CALCIUM 8.7*   Recent Labs  Lab 09/25/20 2118  AST 25  ALT 24  ALKPHOS 69  BILITOT 0.5  PROT 6.8  ALBUMIN 3.7   Recent Labs  Lab 09/25/20 2118  WBC 8.8  NEUTROABS 4.9  HGB 13.1  12.6  HCT 38.9  37.0  MCV 94.9  PLT 220   No results for input(s): CKTOTAL, CKMB, CKMBINDEX, TROPONINI in the last 168 hours. Recent Labs    09/25/20 2118  LABPROT 13.0  INR 1.0   No results for input(s): COLORURINE, LABSPEC, PHURINE, GLUCOSEU, HGBUR, BILIRUBINUR, KETONESUR, PROTEINUR, UROBILINOGEN, NITRITE,  LEUKOCYTESUR in the last 72 hours.  Invalid input(s): APPERANCEUR     Component Value Date/Time   CHOL 109 09/26/2020 0317   CHOL 160 05/20/2020 0909   TRIG 231 (H) 09/26/2020 0317   HDL 40 (L) 09/26/2020 0317   HDL 56 05/20/2020 0909   CHOLHDL 2.7 09/26/2020 0317   VLDL 46 (H) 09/26/2020 0317   LDLCALC 23 09/26/2020 0317   LDLCALC 80 05/20/2020 0909   Lab Results  Component Value Date   HGBA1C 5.4 09/26/2020      Component Value Date/Time   LABOPIA NONE DETECTED 03/12/2020 1226   COCAINSCRNUR NONE DETECTED 03/12/2020 1226   LABBENZ NONE DETECTED 03/12/2020 1226   AMPHETMU NONE DETECTED 03/12/2020 1226   THCU NONE DETECTED 03/12/2020 1226   LABBARB NONE DETECTED 03/12/2020 1226    No results for input(s): ETH in the last 168 hours.  I have personally reviewed the radiological images below and agree with the radiology interpretations.  CT ANGIO HEAD W OR WO CONTRAST  Result Date: 09/25/2020 CLINICAL DATA:  Initial evaluation for acute stroke, right-sided facial droop, right arm drift. EXAM: CT ANGIOGRAPHY HEAD AND NECK TECHNIQUE: Multidetector CT imaging of the head and neck was performed using the standard protocol during bolus administration of intravenous contrast. Multiplanar CT image reconstructions and MIPs were obtained to evaluate the vascular anatomy. Carotid stenosis measurements (when applicable) are obtained utilizing NASCET criteria, using  the distal internal carotid diameter as the denominator. CONTRAST:  89mL OMNIPAQUE IOHEXOL 350 MG/ML SOLN COMPARISON:  Prior CT from earlier the same day as well as multiple previous exams. FINDINGS: CTA NECK FINDINGS Aortic arch: Visualized aortic arch normal in caliber with normal branch pattern. No stenosis about the origin of the great vessels. Right carotid system: Right common and internal carotid arteries widely patent without stenosis, dissection or occlusion. Left carotid system: Left common and internal carotid arteries  widely patent without stenosis, dissection or occlusion. Vertebral arteries: Both vertebral arteries arise from the subclavian arteries. Strongly dominant right vertebral artery with a diffusely hypoplastic left vertebral artery. Vertebral arteries widely patent within the neck without stenosis, dissection or occlusion. Skeleton: No visible acute osseous finding. No discrete or worrisome osseous lesions. Mild to moderate cervical spondylosis at C5-6 and C6-7. Other neck: No other acute soft tissue abnormality within the neck. No mass or adenopathy. Upper chest: Visualized upper chest demonstrates no acute finding. Surgical clips noted about the anterior mediastinum. Review of the MIP images confirms the above findings CTA HEAD FINDINGS Anterior circulation: Both internal carotid arteries widely patent to the termini without stenosis. A1 segments widely patent. Normal anterior communicating artery complex. Anterior cerebral arteries widely patent to their distal aspects. No M1 stenosis or occlusion. Normal MCA bifurcations. On the left, there is acute occlusion of a left M3 branch, superior division (series 7, image 95). Remainder of the left MCA branches are perfused. Distal right MCA branches well perfused as well. Posterior circulation: Dominant right V4 segment widely patent to the vertebrobasilar junction. Hypoplastic left vertebral artery terminates in PICA. Both PICA origins patent and normal. Basilar patent to its distal aspect without stenosis. Superior cerebellar arteries patent bilaterally. Both PCAs primarily supplied via the basilar well perfused to their distal aspects. Venous sinuses: Patent. Anatomic variants: Hypoplastic left vertebral artery terminates in PICA. No aneurysm. Review of the MIP images confirms the above findings IMPRESSION: 1. Acute left M3 branch occlusion, superior division. 2. Otherwise negative CTA of the head and neck. No other large vessel occlusion, hemodynamically significant  stenosis, or other acute vascular abnormality. Critical Value/emergent results were discussed by telephone at the time of interpretation on 09/25/2020 at 9:25 pm with provider ASHISH ARORA. Electronically Signed   By: Rise Mu M.D.   On: 09/25/2020 21:45   CT ANGIO NECK W OR WO CONTRAST  Result Date: 09/25/2020 CLINICAL DATA:  Initial evaluation for acute stroke, right-sided facial droop, right arm drift. EXAM: CT ANGIOGRAPHY HEAD AND NECK TECHNIQUE: Multidetector CT imaging of the head and neck was performed using the standard protocol during bolus administration of intravenous contrast. Multiplanar CT image reconstructions and MIPs were obtained to evaluate the vascular anatomy. Carotid stenosis measurements (when applicable) are obtained utilizing NASCET criteria, using the distal internal carotid diameter as the denominator. CONTRAST:  4mL OMNIPAQUE IOHEXOL 350 MG/ML SOLN COMPARISON:  Prior CT from earlier the same day as well as multiple previous exams. FINDINGS: CTA NECK FINDINGS Aortic arch: Visualized aortic arch normal in caliber with normal branch pattern. No stenosis about the origin of the great vessels. Right carotid system: Right common and internal carotid arteries widely patent without stenosis, dissection or occlusion. Left carotid system: Left common and internal carotid arteries widely patent without stenosis, dissection or occlusion. Vertebral arteries: Both vertebral arteries arise from the subclavian arteries. Strongly dominant right vertebral artery with a diffusely hypoplastic left vertebral artery. Vertebral arteries widely patent within the neck without stenosis, dissection or  occlusion. Skeleton: No visible acute osseous finding. No discrete or worrisome osseous lesions. Mild to moderate cervical spondylosis at C5-6 and C6-7. Other neck: No other acute soft tissue abnormality within the neck. No mass or adenopathy. Upper chest: Visualized upper chest demonstrates no acute  finding. Surgical clips noted about the anterior mediastinum. Review of the MIP images confirms the above findings CTA HEAD FINDINGS Anterior circulation: Both internal carotid arteries widely patent to the termini without stenosis. A1 segments widely patent. Normal anterior communicating artery complex. Anterior cerebral arteries widely patent to their distal aspects. No M1 stenosis or occlusion. Normal MCA bifurcations. On the left, there is acute occlusion of a left M3 branch, superior division (series 7, image 95). Remainder of the left MCA branches are perfused. Distal right MCA branches well perfused as well. Posterior circulation: Dominant right V4 segment widely patent to the vertebrobasilar junction. Hypoplastic left vertebral artery terminates in PICA. Both PICA origins patent and normal. Basilar patent to its distal aspect without stenosis. Superior cerebellar arteries patent bilaterally. Both PCAs primarily supplied via the basilar well perfused to their distal aspects. Venous sinuses: Patent. Anatomic variants: Hypoplastic left vertebral artery terminates in PICA. No aneurysm. Review of the MIP images confirms the above findings IMPRESSION: 1. Acute left M3 branch occlusion, superior division. 2. Otherwise negative CTA of the head and neck. No other large vessel occlusion, hemodynamically significant stenosis, or other acute vascular abnormality. Critical Value/emergent results were discussed by telephone at the time of interpretation on 09/25/2020 at 9:25 pm with provider ASHISH ARORA. Electronically Signed   By: Rise Mu M.D.   On: 09/25/2020 21:45   MR ANGIO HEAD WO CONTRAST  Result Date: 09/26/2020 CLINICAL DATA:  Follow-up examination for acute stroke, previous MCA branch occlusion, status post tPA and catheter directed revascularization. EXAM: MRI HEAD WITHOUT CONTRAST MRA HEAD WITHOUT CONTRAST TECHNIQUE: Multiplanar, multiecho pulse sequences of the brain and surrounding structures  were obtained without intravenous contrast. Angiographic images of the head were obtained using MRA technique without contrast. COMPARISON:  Prior studies from 09/25/2020 FINDINGS: MRI HEAD FINDINGS Brain: Cerebral volume within normal limits for age. Minimal scattered T2/FLAIR hyperintensity noted within the periventricular white matter, most likely related chronic microvascular ischemic disease, minimal for age. Area of confluent restricted diffusion seen involving the anterior left frontal region with mild involvement of the underlying anterior left insular cortex, consistent with acute left MCA distribution infarct (series 3, image 32). Associated early gyral swelling and edema within the area of infarction without significant regional mass effect. Subcentimeter focus of susceptibility artifact at the left frontal operculum/external capsule likely reflects a small focus of petechial hemorrhage (series 8, image 13). No frank malignant hemorrhagic transformation. No other evidence for acute or subacute ischemia. Gray-white matter differentiation otherwise maintained. No other areas of chronic cortical infarction. No other evidence for acute or chronic intracranial hemorrhage. No mass lesion, midline shift or mass effect. No hydrocephalus or extra-axial fluid collection. Pituitary gland suprasellar region normal. Midline structures intact. Vascular: Major intracranial vascular flow voids are well maintained. Skull and upper cervical spine: Craniocervical junction within normal limits. Bone marrow signal intensity normal. No focal marrow replacing lesion. No scalp soft tissue abnormality. Sinuses/Orbits: Globes and orbital soft tissues within normal limits. Left maxillary sinus retention cysts noted. Additional mild mucosal thickening elsewhere within the paranasal sinuses. No mastoid effusion. Inner ear structures within normal limits. Other: None. MRA HEAD FINDINGS ANTERIOR CIRCULATION: Visualized distal cervical  segments of the internal carotid arteries are patent with  antegrade flow. Petrous, cavernous, and supraclinoid segments widely patent without stenosis or other abnormality. A1 segments widely patent. Normal anterior communicating artery complex. Anterior cerebral arteries widely patent to their distal aspects. No M1 stenosis or occlusion. Normal MCA bifurcations. Interval revascularization of previously identified left MCA branch occlusion. MCA branches now well perfused and patent bilaterally. POSTERIOR CIRCULATION: Dominant right vertebral artery widely patent to the vertebrobasilar junction. Left vertebral artery markedly hypoplastic and terminates in PICA. Both PICA patent. Basilar patent to its distal aspect without stenosis. Superior cerebellar arteries patent bilaterally. Both PCAs primarily supplied via the basilar well perfused to their distal aspects. No intracranial aneurysm. IMPRESSION: MRI HEAD IMPRESSION: 1. Acute ischemic left MCA distribution infarct as above. Subcentimeter focus of susceptibility artifact consistent with a small focus of petechial hemorrhage. No frank malignant hemorrhagic transformation or significant mass effect. 2. Otherwise essentially normal brain MRI for age. MRA HEAD IMPRESSION: 1. Interval revascularization of previously identified left MCA branch occlusion, now patent. 2. Otherwise stable and normal intracranial MRA. Electronically Signed   By: Rise Mu M.D.   On: 09/26/2020 23:04   MR BRAIN WO CONTRAST  Result Date: 09/26/2020 CLINICAL DATA:  Follow-up examination for acute stroke, previous MCA branch occlusion, status post tPA and catheter directed revascularization. EXAM: MRI HEAD WITHOUT CONTRAST MRA HEAD WITHOUT CONTRAST TECHNIQUE: Multiplanar, multiecho pulse sequences of the brain and surrounding structures were obtained without intravenous contrast. Angiographic images of the head were obtained using MRA technique without contrast. COMPARISON:  Prior  studies from 09/25/2020 FINDINGS: MRI HEAD FINDINGS Brain: Cerebral volume within normal limits for age. Minimal scattered T2/FLAIR hyperintensity noted within the periventricular white matter, most likely related chronic microvascular ischemic disease, minimal for age. Area of confluent restricted diffusion seen involving the anterior left frontal region with mild involvement of the underlying anterior left insular cortex, consistent with acute left MCA distribution infarct (series 3, image 32). Associated early gyral swelling and edema within the area of infarction without significant regional mass effect. Subcentimeter focus of susceptibility artifact at the left frontal operculum/external capsule likely reflects a small focus of petechial hemorrhage (series 8, image 13). No frank malignant hemorrhagic transformation. No other evidence for acute or subacute ischemia. Gray-white matter differentiation otherwise maintained. No other areas of chronic cortical infarction. No other evidence for acute or chronic intracranial hemorrhage. No mass lesion, midline shift or mass effect. No hydrocephalus or extra-axial fluid collection. Pituitary gland suprasellar region normal. Midline structures intact. Vascular: Major intracranial vascular flow voids are well maintained. Skull and upper cervical spine: Craniocervical junction within normal limits. Bone marrow signal intensity normal. No focal marrow replacing lesion. No scalp soft tissue abnormality. Sinuses/Orbits: Globes and orbital soft tissues within normal limits. Left maxillary sinus retention cysts noted. Additional mild mucosal thickening elsewhere within the paranasal sinuses. No mastoid effusion. Inner ear structures within normal limits. Other: None. MRA HEAD FINDINGS ANTERIOR CIRCULATION: Visualized distal cervical segments of the internal carotid arteries are patent with antegrade flow. Petrous, cavernous, and supraclinoid segments widely patent without  stenosis or other abnormality. A1 segments widely patent. Normal anterior communicating artery complex. Anterior cerebral arteries widely patent to their distal aspects. No M1 stenosis or occlusion. Normal MCA bifurcations. Interval revascularization of previously identified left MCA branch occlusion. MCA branches now well perfused and patent bilaterally. POSTERIOR CIRCULATION: Dominant right vertebral artery widely patent to the vertebrobasilar junction. Left vertebral artery markedly hypoplastic and terminates in PICA. Both PICA patent. Basilar patent to its distal aspect without stenosis. Superior cerebellar  arteries patent bilaterally. Both PCAs primarily supplied via the basilar well perfused to their distal aspects. No intracranial aneurysm. IMPRESSION: MRI HEAD IMPRESSION: 1. Acute ischemic left MCA distribution infarct as above. Subcentimeter focus of susceptibility artifact consistent with a small focus of petechial hemorrhage. No frank malignant hemorrhagic transformation or significant mass effect. 2. Otherwise essentially normal brain MRI for age. MRA HEAD IMPRESSION: 1. Interval revascularization of previously identified left MCA branch occlusion, now patent. 2. Otherwise stable and normal intracranial MRA. Electronically Signed   By: Rise Mu M.D.   On: 09/26/2020 23:04   IR CT Head Ltd  Result Date: 09/27/2020 INDICATION: New onset left gaze deviation, wrist and hand paresthesias and aphasia. Occluded left middle cerebral artery superior division on CT angiogram of the head and neck. EXAM: 1. EMERGENT LARGE VESSEL OCCLUSION THROMBOLYSIS (anterior CIRCULATION) COMPARISON:  CT angiogram of the head and neck of September 25, 2020. MEDICATIONS: Ancef 2 g IV antibiotic was administered within 1 hour of the procedure. ANESTHESIA/SEDATION: General anesthesia CONTRAST:  Isovue 300 approximately 50 mL FLUOROSCOPY TIME:  Fluoroscopy Time: 28 minutes 30 seconds (1,100 mGy). COMPLICATIONS: None  immediate. TECHNIQUE: Following a full explanation of the procedure along with the potential associated complications, an informed witnessed consent was obtained. The risks of intracranial hemorrhage of 10%, worsening neurological deficit, ventilator dependency, death and inability to revascularize were all reviewed in detail with the patient's spouse. The patient was then put under general anesthesia by the Department of Anesthesiology at [facility]. The right groin was prepped and draped in the usual sterile fashion. Thereafter using modified Seldinger technique, transfemoral access into the right common femoral artery was obtained without difficulty. Over a 0.035 inch guidewire an 8 French 25 cm Pinnacle sheath was inserted. Through this, and also over a 0.035 inch guidewire a 5 French Simmons 2 support catheter inside of an 087 balloon guide catheter was advanced to the aortic arch region and selectively cannulated in the left common carotid artery to the aortic arch region and selectively positioned in the left common carotid artery at the bifurcation. FINDINGS: The left common carotid arteriogram demonstrates the left external carotid artery and its major branches to be widely patent. The left internal carotid artery at the bulb to the cranial skull base is widely patent. The petrous, the cavernous and the supraclinoid segments are widely patent. The left anterior cerebral artery opacifies into the capillary and venous phases. The left middle cerebral artery opacifies into the capillary and venous phases. Demonstrated is occlusion of the superior division of the left middle cerebral artery in its mid M2 segment. A prominent area of hypoperfusion is demonstrated on the delayed arterial and capillary phases. PROCEDURE: Through the balloon guide catheter, a combination of an 055 136 cm Zoom aspiration catheter inside of which was a 35 160 cm Zoom aspiration Trevo ProVue microcatheter was advanced to the  M1 segment of the left middle cerebral artery. The micro guidewire was then gently manipulated and advanced through the occluded superior division projecting medially and then laterally followed by the 35 Zoom aspiration catheter which was buried in the proximal occluded segment. The guidewire was removed. There was no aspiration at the hub of the 35 Zoom aspiration catheter. Constant aspiration was then applied with a 20 mL syringe at the hub of the 35 Zoom aspiration catheter, and also at the 55 Zoom aspiration catheter with a Penumbra aspiration device. The Zoom 55 had been advanced into the mid superior division. After approximately 2  minutes, the combination of the 35 and the 55 Zoom aspiration catheters was retrieved and removed. Proximal flow arrest was reversed. A control arteriogram performed through the balloon guide catheter now in the distal left internal carotid artery demonstrated minimal change in the occluded distal superior division. A second pass was then made again using the above combination. The 35 Zoom aspiration catheter was advanced just proximal to the occlusion. A gentle control arteriogram performed through this demonstrated a large filling defect just distal to this extending into the M3 segment. An Aristotle 014 inch micro guidewire was then introduced through the 35 Zoom aspiration catheter. The guidewire was advanced more distally into the M3 M4 region followed by the 35 Zoom aspiration catheter in the distal M3 segment. Aspiration was then applied with a 20 mL syringe at the 55 Zoom aspiration catheter hub, and also at the hub of the 55 Zoom aspiration catheter which was then advanced more distally with proximal flow arrest. After approximately 2 minutes, the combination of the 35 Zoom and the 55 Zoom aspiration catheter was retrieved and removed. Flow arrest was reversed. Control arteriogram performed through the balloon guide in the distal left ICA demonstrated now near complete  revascularization of the superior division of the left middle cerebral artery. There was occlusion in the distal M4 region of this branch with a focal area of hypoperfusion. A TICI 2C revascularization was achieved. Balloon guide was then retrieved into the left common carotid artery. A control arteriogram performed through this demonstrated patency of the left ICA extra cranially and intracranially. The left anterior cerebral artery remained widely patent. The left middle cerebral artery distribution demonstrated no evidence of intraluminal filling defects or of occlusions except for the distal M4 occlusion. The balloon guide was removed. An 8 French Angio-Seal closure device was applied for hemostasis in the right groin puncture site. Distal pulses remained Dopplerable in all fours unchanged. A CT scan of the brain demonstrated no evidence of gross hemorrhage or mass effect or midline shift. A less than 1 cm area of contrast blush was noted in the subcortical mid temporal/parietal region. Patient's general anesthesia was then reversed and the patient was extubated. Upon recovery the patient was able to obey simple instructions appropriately. She had minimal weakness in the right upper extremity with a right facial droop. She exhibited no verbal output. She was then transferred to neuro ICU for post revascularization management. IMPRESSION: Status post near complete revascularization of occluded superior division of the left middle cerebral artery and the mid M2 segment with 2 passes with contact aspiration and proximal aspiration achieving a TICI 2C revascularization. PLAN: Follow-up in the clinic approximately 4 weeks post discharge. Electronically Signed   By: Julieanne Cotton M.D.   On: 09/26/2020 10:28   ECHOCARDIOGRAM COMPLETE  Result Date: 09/26/2020    ECHOCARDIOGRAM REPORT   Patient Name:   Myalynn L Maguire Date of Exam: 09/26/2020 Medical Rec #:  [id number]      Height:       65.0 in Accession #:     [id number]     Weight:       195.1 lb Date of Birth:  September 13, 1970      BSA:          1.957 m Patient Age:    49 years       BP:           110/62 mmHg Patient Gender: F  HR:           74 bpm. Exam Location:  Inpatient Procedure: 2D Echo, Cardiac Doppler and Color Doppler Indications:    Stroke  History:        Patient has prior history of Echocardiogram examinations, most                 recent 06/03/2020. Risk Factors:Former Smoker. H/O PFO closure                 30 years ago.  Sonographer:    Ross Ludwig RDCS (AE) Referring Phys: [id number] ASHISH ARORA IMPRESSIONS  1. Left ventricular ejection fraction, by estimation, is 60 to 65%. The left ventricle has normal function. The left ventricle has no regional wall motion abnormalities. Left ventricular diastolic parameters were normal.  2. Right ventricular systolic function is normal. The right ventricular size is normal. There is normal pulmonary artery systolic pressure.  3. The atrial septum appears intact, without residual shunt, without a visible closure device (surgical closure?). It tends to bow right to left, but there are no other signs of high right atrial pressure.  4. The mitral valve is normal in structure. No evidence of mitral valve regurgitation. No evidence of mitral stenosis.  5. The aortic valve is normal in structure. Aortic valve regurgitation is not visualized. No aortic stenosis is present.  6. The inferior vena cava is normal in size with greater than 50% respiratory variability, suggesting right atrial pressure of 3 mmHg. Comparison(s): A prior study was performed on 03/13/2020. The small saline contrast shunt seen on TEE was not demonstrated on the current study. FINDINGS  Left Ventricle: Left ventricular ejection fraction, by estimation, is 60 to 65%. The left ventricle has normal function. The left ventricle has no regional wall motion abnormalities. The left ventricular internal cavity size was normal in size. There is  no left  ventricular hypertrophy. Left ventricular diastolic parameters were normal. Indeterminate filling pressures. Right Ventricle: The right ventricular size is normal. No increase in right ventricular wall thickness. Right ventricular systolic function is normal. There is normal pulmonary artery systolic pressure. The tricuspid regurgitant velocity is 2.37 m/s, and  with an assumed right atrial pressure of 8 mmHg, the estimated right ventricular systolic pressure is 30.5 mmHg. Left Atrium: Left atrial size was normal in size. Right Atrium: Right atrial size was normal in size. Pericardium: There is no evidence of pericardial effusion. Mitral Valve: The mitral valve is normal in structure. No evidence of mitral valve regurgitation. No evidence of mitral valve stenosis. MV peak gradient, 3.1 mmHg. The mean mitral valve gradient is 1.0 mmHg. Tricuspid Valve: The tricuspid valve is normal in structure. Tricuspid valve regurgitation is not demonstrated. No evidence of tricuspid stenosis. Aortic Valve: The aortic valve is normal in structure. Aortic valve regurgitation is not visualized. No aortic stenosis is present. Aortic valve mean gradient measures 5.0 mmHg. Aortic valve peak gradient measures 9.7 mmHg. Aortic valve area, by VTI measures 1.89 cm. Pulmonic Valve: The pulmonic valve was normal in structure. Pulmonic valve regurgitation is not visualized. No evidence of pulmonic stenosis. Aorta: The aortic root is normal in size and structure. Venous: The inferior vena cava is normal in size with greater than 50% respiratory variability, suggesting right atrial pressure of 3 mmHg. IAS/Shunts: No atrial level shunt detected by color flow Doppler.  LEFT VENTRICLE PLAX 2D LVIDd:         4.20 cm  Diastology LVIDs:  3.10 cm  LV e' medial:    5.63 cm/s LV PW:         1.00 cm  LV E/e' medial:  16.2 LV IVS:        1.00 cm  LV e' lateral:   8.25 cm/s LVOT diam:     1.90 cm  LV E/e' lateral: 11.1 LV SV:         61 LV SV  Index:   31 LVOT Area:     2.84 cm  RIGHT VENTRICLE             IVC RV Basal diam:  2.60 cm     IVC diam: 1.60 cm RV S prime:     10.10 cm/s TAPSE (M-mode): 2.5 cm LEFT ATRIUM             Index       RIGHT ATRIUM           Index LA diam:        2.60 cm 1.33 cm/m  RA Area:     13.10 cm LA Vol (A2C):   47.0 ml 24.01 ml/m RA Volume:   26.20 ml  13.38 ml/m LA Vol (A4C):   44.3 ml 22.63 ml/m LA Biplane Vol: 48.5 ml 24.78 ml/m  AORTIC VALVE AV Area (Vmax):    2.00 cm AV Area (Vmean):   2.09 cm AV Area (VTI):     1.89 cm AV Vmax:           156.00 cm/s AV Vmean:          95.700 cm/s AV VTI:            0.323 m AV Peak Grad:      9.7 mmHg AV Mean Grad:      5.0 mmHg LVOT Vmax:         110.00 cm/s LVOT Vmean:        70.600 cm/s LVOT VTI:          0.215 m LVOT/AV VTI ratio: 0.67  AORTA Ao Root diam: 3.00 cm Ao Asc diam:  2.80 cm MITRAL VALVE               TRICUSPID VALVE MV Area (PHT): 2.22 cm    TR Peak grad:   22.5 mmHg MV Area VTI:   2.00 cm    TR Vmax:        237.00 cm/s MV Peak grad:  3.1 mmHg MV Mean grad:  1.0 mmHg    SHUNTS MV Vmax:       0.87 m/s    Systemic VTI:  0.22 m MV Vmean:      51.4 cm/s   Systemic Diam: 1.90 cm MV Decel Time: 342 msec MV E velocity: 91.30 cm/s MV A velocity: 74.60 cm/s MV E/A ratio:  1.22 Mihai Croitoru MD Electronically signed by Thurmon FairMihai Croitoru MD Signature Date/Time: 09/26/2020/1:53:17 PM    Final    IR PERCUTANEOUS ART THROMBECTOMY/INFUSION INTRACRANIAL INC DIAG ANGIO  Result Date: 09/27/2020 INDICATION: New onset left gaze deviation, wrist and hand paresthesias and aphasia. Occluded left middle cerebral artery superior division on CT angiogram of the head and neck. EXAM: 1. EMERGENT LARGE VESSEL OCCLUSION THROMBOLYSIS (anterior CIRCULATION) COMPARISON:  CT angiogram of the head and neck of September 25, 2020. MEDICATIONS: Ancef 2 g IV antibiotic was administered within 1 hour of the procedure. ANESTHESIA/SEDATION: General anesthesia CONTRAST:  Isovue 300 approximately 50 mL  FLUOROSCOPY TIME:  Fluoroscopy Time: 28 minutes 30 seconds (  1,100 mGy). COMPLICATIONS: None immediate. TECHNIQUE: Following a full explanation of the procedure along with the potential associated complications, an informed witnessed consent was obtained. The risks of intracranial hemorrhage of 10%, worsening neurological deficit, ventilator dependency, death and inability to revascularize were all reviewed in detail with the patient's spouse. The patient was then put under general anesthesia by the Department of Anesthesiology at [facility]. The right groin was prepped and draped in the usual sterile fashion. Thereafter using modified Seldinger technique, transfemoral access into the right common femoral artery was obtained without difficulty. Over a 0.035 inch guidewire an 8 French 25 cm Pinnacle sheath was inserted. Through this, and also over a 0.035 inch guidewire a 5 French Simmons 2 support catheter inside of an 087 balloon guide catheter was advanced to the aortic arch region and selectively cannulated in the left common carotid artery to the aortic arch region and selectively positioned in the left common carotid artery at the bifurcation. FINDINGS: The left common carotid arteriogram demonstrates the left external carotid artery and its major branches to be widely patent. The left internal carotid artery at the bulb to the cranial skull base is widely patent. The petrous, the cavernous and the supraclinoid segments are widely patent. The left anterior cerebral artery opacifies into the capillary and venous phases. The left middle cerebral artery opacifies into the capillary and venous phases. Demonstrated is occlusion of the superior division of the left middle cerebral artery in its mid M2 segment. A prominent area of hypoperfusion is demonstrated on the delayed arterial and capillary phases. PROCEDURE: Through the balloon guide catheter, a combination of an 055 136 cm Zoom aspiration catheter  inside of which was a 35 160 cm Zoom aspiration Trevo ProVue microcatheter was advanced to the M1 segment of the left middle cerebral artery. The micro guidewire was then gently manipulated and advanced through the occluded superior division projecting medially and then laterally followed by the 35 Zoom aspiration catheter which was buried in the proximal occluded segment. The guidewire was removed. There was no aspiration at the hub of the 35 Zoom aspiration catheter. Constant aspiration was then applied with a 20 mL syringe at the hub of the 35 Zoom aspiration catheter, and also at the 55 Zoom aspiration catheter with a Penumbra aspiration device. The Zoom 55 had been advanced into the mid superior division. After approximately 2 minutes, the combination of the 35 and the 55 Zoom aspiration catheters was retrieved and removed. Proximal flow arrest was reversed. A control arteriogram performed through the balloon guide catheter now in the distal left internal carotid artery demonstrated minimal change in the occluded distal superior division. A second pass was then made again using the above combination. The 35 Zoom aspiration catheter was advanced just proximal to the occlusion. A gentle control arteriogram performed through this demonstrated a large filling defect just distal to this extending into the M3 segment. An Aristotle 014 inch micro guidewire was then introduced through the 35 Zoom aspiration catheter. The guidewire was advanced more distally into the M3 M4 region followed by the 35 Zoom aspiration catheter in the distal M3 segment. Aspiration was then applied with a 20 mL syringe at the 55 Zoom aspiration catheter hub, and also at the hub of the 55 Zoom aspiration catheter which was then advanced more distally with proximal flow arrest. After approximately 2 minutes, the combination of the 35 Zoom and the 55 Zoom aspiration catheter was retrieved and removed. Flow arrest was reversed. Control  arteriogram performed through the balloon guide in the distal left ICA demonstrated now near complete revascularization of the superior division of the left middle cerebral artery. There was occlusion in the distal M4 region of this branch with a focal area of hypoperfusion. A TICI 2C revascularization was achieved. Balloon guide was then retrieved into the left common carotid artery. A control arteriogram performed through this demonstrated patency of the left ICA extra cranially and intracranially. The left anterior cerebral artery remained widely patent. The left middle cerebral artery distribution demonstrated no evidence of intraluminal filling defects or of occlusions except for the distal M4 occlusion. The balloon guide was removed. An 8 French Angio-Seal closure device was applied for hemostasis in the right groin puncture site. Distal pulses remained Dopplerable in all fours unchanged. A CT scan of the brain demonstrated no evidence of gross hemorrhage or mass effect or midline shift. A less than 1 cm area of contrast blush was noted in the subcortical mid temporal/parietal region. Patient's general anesthesia was then reversed and the patient was extubated. Upon recovery the patient was able to obey simple instructions appropriately. She had minimal weakness in the right upper extremity with a right facial droop. She exhibited no verbal output. She was then transferred to neuro ICU for post revascularization management. IMPRESSION: Status post near complete revascularization of occluded superior division of the left middle cerebral artery and the mid M2 segment with 2 passes with contact aspiration and proximal aspiration achieving a TICI 2C revascularization. PLAN: Follow-up in the clinic approximately 4 weeks post discharge. Electronically Signed   By: Julieanne Cotton M.D.   On: 09/26/2020 10:28   CT HEAD CODE STROKE WO CONTRAST  Result Date: 09/25/2020 CLINICAL DATA:  Code stroke. Initial  evaluation for acute right-sided facial droop, right arm drift. EXAM: CT HEAD WITHOUT CONTRAST TECHNIQUE: Contiguous axial images were obtained from the base of the skull through the vertex without intravenous contrast. COMPARISON:  Prior MRI from 06/04/2020. FINDINGS: Brain: Cerebral volume within normal limits. No acute intracranial hemorrhage. No acute large vessel territory infarct. No mass lesion, midline shift or mass effect. No hydrocephalus or extra-axial fluid collection. Vascular: No hyperdense vessel. Skull: Scalp soft tissues and calvarium within normal limits. Sinuses/Orbits: Left gaze noted. Globes and orbital soft tissues otherwise unremarkable. Left maxillary sinus retention cyst. Additional scattered mucosal thickening noted within the ethmoidal air cells and maxillary sinuses. Paranasal sinuses are otherwise clear. No mastoid effusion. Other: None. ASPECTS [facility] Stroke Program Early CT Score) - Ganglionic level infarction (caudate, lentiform nuclei, internal capsule, insula, M1-M3 cortex): 7 - Supraganglionic infarction (M4-M6 cortex): 3 Total score (0-10 with 10 being normal): 10 IMPRESSION: 1. Negative head CT.  No acute intracranial abnormality. 2. ASPECTS is 10. These results were communicated to Dr. Wilford Corner at 9:25 pmon 3/24/2022by text page via the [facility] messaging system. Electronically Signed   By: Rise Mu M.D.   On: 09/25/2020 21:30    PHYSICAL EXAM  Temp:  [97.6 F (36.4 C)-98.6 F (37 C)] 98.2 F (36.8 C) (03/27 0315) Pulse Rate:  [65-79] 79 (03/27 0315) Resp:  [16-18] 18 (03/27 0315) BP: (112-120)/(56-64) 112/60 (03/27 0315) SpO2:  [92 %-99 %] 92 % (03/27 0315)  General - Well nourished, well developed, in no apparent distress.  Ophthalmologic - fundi not visualized due to noncooperation.  Cardiovascular - Regular rhythm and rate.  Mental Status -  Awake alert, able to follow simple commands.  However, expressive aphasia, not able to name or repeat.  She is essentially mute  Cranial Nerves II - XII - II - Visual field intact OU. III, IV, VI - Extraocular movements intact. V - Facial sensation intact bilaterally. VII - slight right facial nasolabial fold flattening VIII - Hearing & vestibular intact bilaterally. X - Palate elevates symmetrically. XI - Chin turning & shoulder shrug intact bilaterally. XII - Tongue protrusion intact.  Motor Strength - The patient's strength was normal in all extremities and pronator drift was absent.  Bulk was normal and fasciculations were absent.   Motor Tone - Muscle tone was assessed at the neck and appendages and was normal.  Reflexes - The patient's reflexes were symmetrical in all extremities and she had no pathological reflexes.  Sensory - Light touch, temperature/pinprick were assessed and were symmetrical.    Coordination - The patient had normal movements in the hands with no ataxia or dysmetria.  Tremor was absent.  Gait and Station - deferred.   ASSESSMENT/PLAN Ms. [name] is a 50 y.o. 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 TPA given.  Status post thrombectomy with left MCA TICI2c.  Stroke:  left MCA infarct embolic secondary to unclear source  Resultant expressive aphasia  CT head no acute abnormality  CTA of the neck left M2/M3 occlusion  IR left M2 occlusion status post TICI 2c reperfusion  MRI left frontal acute infarct  MRA Interval revascularization of previously identified left MCA branch occlusion, now patent. 2. Otherwise stable and normal intracranial MRA.  2D Echo EF 60 to 65%, atrial septum no residual shunt  Loop recorder interrogation pending  LDL 23, TG 231  HgbA1c 5.4  SCDs for VTE prophylaxis  clopidogrel 75 mg daily prior to admission, now on No antithrombotic within 24 hours TPA.  Anticoagulation with a DOAC is  recommended for empiric stroke prevention after her third cryptogenic event.  Given the moderate size infarct, consider waiting after 5 days to minimize risk of hemorrhagic transformation.    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 DAPT ->aspirin only 05/20/2020.CT head and neck negative. EF 50 to 55%. TCD bubble study negative for PFO and LE venous Doppler negative for DVT. Hypercoagulable and autoimmune work-up negative.  Initial TEE concerning for possible IA shunt (previously closed ASD).However, on review from Dr. Excell Seltzer, no significant IA shunt  Cardiac CT 11/19 no CAD or PFO orIA shunt  loop placed 05/20/2020  06/2020 small right frontal lobe infarct, status post TPA, unclear etiology, consider recurrent cryptogenic stroke.  CT no acute abnormality, MRI small right frontal precentral gyrus infarct.  MRA head and neck no LVO.  EF 60 to 65%.  Loop recorder interrogation no A. fib.  Patient declined LP.  Cerebral angiogram negative for vasculitis.  LDL 64, A1c 5.3.  Discharged with DAPT aspirin 81 and Plavix 75.  History of ASD  ASD closure 30 yrs [facility],   small IA shunt seen during TEE 03/2020, but not significantby [place]   Cardiac CT 05/2020 noIA shunt  Hyperlipidemia  Home meds:lipitor 20  LDL 23, goal < 70  Decrease Lipitor to 10 mg daily given low LDL  Continue statin at discharge  Hashimoto'sthyroiditis  Diagnosed 17 years ago  03/2020, TSH6.13 (H), but FT4 normal, Thyroid peroxidase Ab55 (H), Thyroglobulin Ab- 1.5 (H)  06/2020 TSHnormaland free T4normal  Other Stroke Risk Factors  FormerCigarette smoker  MildETOH use(1/wk), advised to  drink no more than 1 drink(s) a day  Obesity,Body mass index is 32.47 kg/m., BMI >/= 30 associated with increased stroke risk, recommend weight loss, diet and exercise as appropriate  Hxmenstrualmigraines  Other Active Problems  Elevated TG 231  Episodic headaches at baseline Responded well to usual medication in the hospital today.  Hospital day # 3     Marvel Plan, MD PhD Stroke Neurology 09/28/2020 11:12 AM    To contact Stroke Continuity provider, please refer to [email or web address]. After hours, contact General Neurology

## 2020-09-28 NOTE — TOC Initial Note (Signed)
Transition of Care [facility]) - Initial/Assessment Note    Patient Details  Name: NIKOLA BLACKSTON MRN: [id number] Date of Birth: 1971-06-14  Transition of Care [facility]) CM/SW Contact:    Pollie Friar, RN Phone Number: 09/28/2020, 4:08 PM  Clinical Narrative:                 CM met with the patient, spouse and daughter at the bedside. Pt is aphasic but able to nod and shake head no to questions.  Recommendations are for outpatient therapy. Pt and family agreeable to [facility]. Orders in Epic and information on the AVS.  No DME needs.  Family denies issues with home medications or transportation. Pt will have 24 hour supervision per family.  TOC following for further needs.    Expected Discharge Plan: OP Rehab Barriers to Discharge: Continued Medical Work up   Patient Goals and CMS Choice   CMS Medicare.gov Compare Post Acute Care list provided to:: Patient Choice offered to / list presented to : [place]  Expected Discharge Plan and Services Expected Discharge Plan: OP Rehab   Discharge Planning Services: CM Consult   Living arrangements for the past 2 months: Single Family Home                                      Prior Living Arrangements/Services Living arrangements for the past 2 months: Single Family Home Lives with:: Spouse Patient language and need for interpreter reviewed:: Yes Do you feel safe going back to the place where you live?: Yes      Need for Family Participation in Patient Care: Yes (Comment) Care giver support system in place?: Yes (comment)   Criminal Activity/Legal Involvement Pertinent to Current Situation/Hospitalization: No - Comment as needed  Activities of Daily Living      Permission Sought/Granted                  Emotional Assessment Appearance:: Appears stated age   Affect (typically observed): Accepting Orientation: : Oriented to Self,Oriented to Place,Oriented to Situation   Psych  Involvement: No (comment)  Admission diagnosis:  Acute ischemic stroke [facility]) [I63.9] Patient Active Problem List   Diagnosis Date Noted  . Acute ischemic stroke ([place]) 09/25/2020  . History of ischemic left MCA stroke s/p tPA 06/06/2020  . History of repair of congenital atrial septal defect (ASD) 06/06/2020  . Hyperlipidemia LDL goal <70 06/06/2020  . Received intravenous tissue plasminogen activator (tPA) in emergency department   . Hypothyroid 12/22/2011  . Stress incontinence 12/22/2011  . Family history of kidney disease 12/22/2011  . Routine gynecological examination 12/22/2011  . Routine general medical examination at a health care facility 12/19/2011   PCP:  Abner Greenspan, MD Pharmacy:   [id number] [place] [place] [place] [place]Phone: [phone]: [phone]    Social Determinants of Health (SDOH) Interventions    Readmission Risk Interventions No flowsheet data found.

## 2020-09-29 ENCOUNTER — Other Ambulatory Visit (HOSPITAL_COMMUNITY): Payer: BC Managed Care – PPO

## 2020-09-29 MED ORDER — TICAGRELOR 90 MG PO TABS: 90.0000 mg | ORAL_TABLET | Freq: Two times a day (BID) | ORAL | 0 refills | Status: AC

## 2020-09-29 MED ORDER — ASPIRIN 81 MG PO TBEC: 81.0000 mg | DELAYED_RELEASE_TABLET | Freq: Every day | ORAL | 11 refills | Status: DC

## 2020-09-29 MED ORDER — PANTOPRAZOLE SODIUM 40 MG PO TBEC: 40.0000 mg | DELAYED_RELEASE_TABLET | Freq: Every day | ORAL | 0 refills | Status: DC

## 2020-09-29 MED ORDER — TICAGRELOR 90 MG PO TABS
90.0000 mg | ORAL_TABLET | Freq: Two times a day (BID) | ORAL | Status: DC
  Administered 2020-09-29: 90 mg via ORAL
  Filled 2020-09-29: qty 1

## 2020-09-29 NOTE — Progress Notes (Signed)
Physical Therapy Treatment Patient Details Name: [name] MRN: [id number] DOB: Feb 06, 1971 Today's Date: 09/29/2020    History of Present Illness 50 yo female presenting with R facial droop, R sided weakness, aphasia, and L gaze. Given IV TPA on 3/24. Noncontrast head CT negative for bleed. S/p L revascularization of occluded L MCA. Extubated 3/24. 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    PT Comments    Excellent progress with mobility. Pt demonstrates independence with bed mobility, transfers, and ambulation 400' without AD. Continue to recommend OPPT for higher level balance activities.    Follow Up Recommendations  Outpatient PT;Supervision for mobility/OOB     Equipment Recommendations  None recommended by PT    Recommendations for Other Services       Precautions / Restrictions Precautions Precautions: None    Mobility  Bed Mobility Overal bed mobility: Independent                  Transfers Overall transfer level: Independent Equipment used: None                Ambulation/Gait Ambulation/Gait assistance: Independent Gait Distance (Feet): 400 Feet Assistive device: None Gait Pattern/deviations: WFL(Within Functional Limits) Gait velocity: WFL Gait velocity interpretation: >2.62 ft/sec, indicative of community ambulatory General Gait Details: steady gait   Stairs             Wheelchair Mobility    Modified Rankin (Stroke Patients Only) Modified Rankin (Stroke Patients Only) Pre-Morbid Rankin Score: No symptoms Modified Rankin: Moderate disability     Balance Overall balance assessment: Modified Independent                                          Cognition Arousal/Alertness: Awake/alert Behavior During Therapy: WFL for tasks assessed/performed Overall Cognitive Status: Difficult to assess                                 General Comments: Pt following all basic commands  when given verbally. Deficits noted in comprehension of written instructions.      Exercises      General Comments        Pertinent Vitals/Pain Pain Assessment: No/denies pain    Home Living                      Prior Function            PT Goals (current goals can now be found in the care plan section) Acute Rehab PT Goals Patient Stated Goal: none stated Progress towards PT goals: Progressing toward goals    Frequency    Min 4X/week      PT Plan Current plan remains appropriate    Co-evaluation              AM-PAC PT "6 Clicks" Mobility   Outcome Measure  Help needed turning from your back to your side while in a flat bed without using bedrails?: None Help needed moving from lying on your back to sitting on the side of a flat bed without using bedrails?: None Help needed moving to and from a bed to a chair (including a wheelchair)?: None Help needed standing up from a chair using your arms (e.g., wheelchair or bedside chair)?: None Help needed to walk in hospital room?:  A Little Help needed climbing 3-5 steps with a railing? : A Little 6 Click Score: 22    End of Session   Activity Tolerance: Patient tolerated treatment well Patient left: in bed;with call bell/phone within reach;with family/visitor present Nurse Communication: Mobility status PT Visit Diagnosis: Other abnormalities of gait and mobility (R26.89)     Time: 4270-6237 PT Time Calculation (min) (ACUTE ONLY): 16 min  Charges:  $Gait Training: 8-22 mins                     Aida Raider, PT  Office # [phone] Pager [phone]    Ilda Foil 09/29/2020, 11:13 AM

## 2020-09-29 NOTE — Progress Notes (Signed)
TCD w/ bubble study attempt. Patient and family members do not wish to have the TCD bubble performed at this time. May consider having test performed outpatient at a later date. Patient does not wish to have an IV placed at this time for the test to be performed. Family states that previous TCD bubble was negative, so they are unsure how they would like to proceed. Informed Dr. Pearlean Brownie of family wishes, and advised family to discuss further with Dr. Pearlean Brownie when he is available.   Jean Rosenthal, RDMS, RVT

## 2020-09-29 NOTE — Progress Notes (Signed)
Pt safely discharged. Husband at pick up. AVS instructions provided with teach-back method to family and patient. VS wnL and as per flow. All questions and concerns addressed. Pt & husband verbalized understanding. Pt & husband walked down to main entrance per request. All medications from pharmacy returned to patient and signed for. Relinquishing care.

## 2020-09-29 NOTE — Progress Notes (Signed)
Occupational Therapy Treatment Patient Details Name: [name] MRN: [id number] DOB: 11-May-1971 Today's Date: 09/29/2020    History of present illness 50 yo female presenting with R facial droop, R sided weakness, aphasia, and L gaze. Given IV TPA on 3/24. Noncontrast head CT negative for bleed. S/p L revascularization of occluded L MCA. Extubated 3/24. 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   OT comments  Patient supine in bed and agreeable to OT.  Patient completing mobility and transfers with independence.  Engaged in visual scanning and cognitive sequencing tasks during session, min cueing required for more complex tasks and increased time noted to locate letters/numbers on R side of paper.  2 step trail making task completed, attempting to write directions given increased time (writing numbers with more ease than letters), min cueing to use signs but completing task with supervision. Able to follow verbal instructions, difficulty with comprehending written instructions. Patient able to verbalize more today, increased time and encouragement required. Provided handouts for visual scanning and sequencing. Will follow acutely, OP OT remains appropriate.    Follow Up Recommendations  Outpatient OT;Supervision/Assistance - 24 hour    Equipment Recommendations  None recommended by OT    Recommendations for Other Services      Precautions / Restrictions Precautions Precautions: None       Mobility Bed Mobility Overal bed mobility: Independent                  Transfers Overall transfer level: Independent                    Balance Overall balance assessment: Modified Independent                                         ADL either performed or assessed with clinical judgement   ADL Overall ADL's : Needs assistance/impaired                         Toilet Transfer: Modified Independent;Ambulation              General ADL Comments: session focused on cognition and visual scanning     Vision       Perception     Praxis      Cognition Arousal/Alertness: Awake/alert Behavior During Therapy: WFL for tasks assessed/performed Overall Cognitive Status: Difficult to assess                                 General Comments: Patient following all basic commands, increased verbalizations noted today.  Some difficulty with sequencing patterns, comprehension of written directions, and writing.        Exercises     Shoulder Instructions       General Comments Husband and daughter in room.  Patient completed mobility to therapy gym with independence.  Engaged in visual scanning sticky note activity to locate colors, letters on wall with 100% accuracy when scanning from L to R.  Switched to table top activities, scanning randomly on paper to complete simple (1-15) trail with no errors and more complex (a-1, b-2) trail with 3 errors and correcting once identified, with min assist-- but noted increased time to locate numbers/letters on R side.  Also completed functional trail making task, attempting to write directions with max  difficulty (better with numbers vs letters), able to find 2 locations with supervision given cueing to utilize signs. Provided scanning and number/letter copying worksheets.    Pertinent Vitals/ Pain       Pain Assessment: No/denies pain  Home Living                                          Prior Functioning/Environment              Frequency  Min 2X/week        Progress Toward Goals  OT Goals(current goals can now be found in the care plan section)  Progress towards OT goals: Progressing toward goals  Acute Rehab OT Goals Patient Stated Goal: none stated OT Goal Formulation: With patient  Plan Discharge plan remains appropriate;Frequency remains appropriate    Co-evaluation                 AM-PAC OT "6 Clicks" Daily  Activity     Outcome Measure   Help from another person eating meals?: None Help from another person taking care of personal grooming?: A Little Help from another person toileting, which includes using toliet, bedpan, or urinal?: A Little Help from another person bathing (including washing, rinsing, drying)?: A Little Help from another person to put on and taking off regular upper body clothing?: A Little   6 Click Score: 16    End of Session    OT Visit Diagnosis: Unsteadiness on feet (R26.81);Other abnormalities of gait and mobility (R26.89);Muscle weakness (generalized) (M62.81);Hemiplegia and hemiparesis Hemiplegia - Right/Left: Right Hemiplegia - dominant/non-dominant: Non-Dominant   Activity Tolerance Patient tolerated treatment well   Patient Left in bed;with call bell/phone within reach;with family/visitor present   Nurse Communication Mobility status        Time: 7253-6644 OT Time Calculation (min): 40 min  Charges: OT General Charges $OT Visit: 1 Visit OT Treatments $Neuromuscular Re-education: 38-52 mins  [name], OT Acute Rehabilitation Services Pager [phone] Office [phone]    [name] 09/29/2020, 3:57 PM

## 2020-09-29 NOTE — Progress Notes (Signed)
STROKE TEAM PROGRESS NOTE   SUBJECTIVE (INTERVAL HISTORY) No acute events overnight. VSS. Afebrile.   Remains with expressive aphasia. Frustrated with communication difficulty. Follows commands and seems to understand conversation, calm and cooperative.  Her husband is a the bedside.  He asks to repeat bubble study.  We extensively  discussed stroke diagnosis, plan of care and treatment options, reviewed evidence based data for medication selection, cost concerns with affording medications. They agree to [place]/ASA plan. She was offered [place] study participation.    OBJECTIVE Temp:  [98.2 F (36.8 C)-98.7 F (37.1 C)] 98.2 F (36.8 C) (03/28 0818) Pulse Rate:  [63-79] 70 (03/28 0818) Cardiac Rhythm: Normal sinus rhythm (03/28 0833) Resp:  [11-16] 15 (03/28 0818) BP: (108-122)/(52-72) 110/55 (03/28 0818) SpO2:  [94 %-98 %] 95 % (03/28 0818)  Recent Labs  Lab 09/25/20 2112  GLUCAP 116*   Recent Labs  Lab 09/25/20 2118  NA 136  139  K 3.3*  3.3*  CL 104  104  CO2 22  GLUCOSE 121*  120*  BUN 16  19  CREATININE 0.71  0.70  CALCIUM 8.7*   Recent Labs  Lab 09/25/20 2118  AST 25  ALT 24  ALKPHOS 69  BILITOT 0.5  PROT 6.8  ALBUMIN 3.7   Recent Labs  Lab 09/25/20 2118  WBC 8.8  NEUTROABS 4.9  HGB 13.1  12.6  HCT 38.9  37.0  MCV 94.9  PLT 220   No results for input(s): CKTOTAL, CKMB, CKMBINDEX, TROPONINI in the last 168 hours. No results for input(s): LABPROT, INR in the last 72 hours. No results for input(s): COLORURINE, LABSPEC, PHURINE, GLUCOSEU, HGBUR, BILIRUBINUR, KETONESUR, PROTEINUR, UROBILINOGEN, NITRITE, LEUKOCYTESUR in the last 72 hours.  Invalid input(s): APPERANCEUR     Component Value Date/Time   CHOL 109 09/26/2020 0317   CHOL 160 05/20/2020 0909   TRIG 231 (H) 09/26/2020 0317   HDL 40 (L) 09/26/2020 0317   HDL 56 05/20/2020 0909   CHOLHDL 2.7 09/26/2020 0317   VLDL 46 (H) 09/26/2020 0317   LDLCALC 23 09/26/2020 0317   LDLCALC  80 05/20/2020 0909   Lab Results  Component Value Date   HGBA1C 5.4 09/26/2020      Component Value Date/Time   LABOPIA NONE DETECTED 03/12/2020 1226   COCAINSCRNUR NONE DETECTED 03/12/2020 1226   LABBENZ NONE DETECTED 03/12/2020 1226   AMPHETMU NONE DETECTED 03/12/2020 1226   THCU NONE DETECTED 03/12/2020 1226   LABBARB NONE DETECTED 03/12/2020 1226    No results for input(s): ETH in the last 168 hours.  I have personally reviewed the radiological images below and agree with the radiology interpretations.  CT ANGIO HEAD W OR WO CONTRAST  Result Date: 09/25/2020 CLINICAL DATA:  Initial evaluation for acute stroke, right-sided facial droop, right arm drift. EXAM: CT ANGIOGRAPHY HEAD AND NECK TECHNIQUE: Multidetector CT imaging of the head and neck was performed using the standard protocol during bolus administration of intravenous contrast. Multiplanar CT image reconstructions and MIPs were obtained to evaluate the vascular anatomy. Carotid stenosis measurements (when applicable) are obtained utilizing NASCET criteria, using the distal internal carotid diameter as the denominator. CONTRAST:  75mL OMNIPAQUE IOHEXOL 350 MG/ML SOLN COMPARISON:  Prior CT from earlier the same day as well as multiple previous exams. FINDINGS: CTA NECK FINDINGS Aortic arch: Visualized aortic arch normal in caliber with normal branch pattern. No stenosis about the origin of the great vessels. Right carotid system: Right common and internal carotid arteries widely patent without stenosis,  dissection or occlusion. Left carotid system: Left common and internal carotid arteries widely patent without stenosis, dissection or occlusion. Vertebral arteries: Both vertebral arteries arise from the subclavian arteries. Strongly dominant right vertebral artery with a diffusely hypoplastic left vertebral artery. Vertebral arteries widely patent within the neck without stenosis, dissection or occlusion. Skeleton: No visible acute  osseous finding. No discrete or worrisome osseous lesions. Mild to moderate cervical spondylosis at C5-6 and C6-7. Other neck: No other acute soft tissue abnormality within the neck. No mass or adenopathy. Upper chest: Visualized upper chest demonstrates no acute finding. Surgical clips noted about the anterior mediastinum. Review of the MIP images confirms the above findings CTA HEAD FINDINGS Anterior circulation: Both internal carotid arteries widely patent to the termini without stenosis. A1 segments widely patent. Normal anterior communicating artery complex. Anterior cerebral arteries widely patent to their distal aspects. No M1 stenosis or occlusion. Normal MCA bifurcations. On the left, there is acute occlusion of a left M3 branch, superior division (series 7, image 95). Remainder of the left MCA branches are perfused. Distal right MCA branches well perfused as well. Posterior circulation: Dominant right V4 segment widely patent to the vertebrobasilar junction. Hypoplastic left vertebral artery terminates in PICA. Both PICA origins patent and normal. Basilar patent to its distal aspect without stenosis. Superior cerebellar arteries patent bilaterally. Both PCAs primarily supplied via the basilar well perfused to their distal aspects. Venous sinuses: Patent. Anatomic variants: Hypoplastic left vertebral artery terminates in PICA. No aneurysm. Review of the MIP images confirms the above findings IMPRESSION: 1. Acute left M3 branch occlusion, superior division. 2. Otherwise negative CTA of the head and neck. No other large vessel occlusion, hemodynamically significant stenosis, or other acute vascular abnormality. Critical Value/emergent results were discussed by telephone at the time of interpretation on 09/25/2020 at 9:25 pm with provider ASHISH ARORA. Electronically Signed   By: Rise Mu M.D.   On: 09/25/2020 21:45   CT ANGIO NECK W OR WO CONTRAST  Result Date: 09/25/2020 CLINICAL DATA:   Initial evaluation for acute stroke, right-sided facial droop, right arm drift. EXAM: CT ANGIOGRAPHY HEAD AND NECK TECHNIQUE: Multidetector CT imaging of the head and neck was performed using the standard protocol during bolus administration of intravenous contrast. Multiplanar CT image reconstructions and MIPs were obtained to evaluate the vascular anatomy. Carotid stenosis measurements (when applicable) are obtained utilizing NASCET criteria, using the distal internal carotid diameter as the denominator. CONTRAST:  75mL OMNIPAQUE IOHEXOL 350 MG/ML SOLN COMPARISON:  Prior CT from earlier the same day as well as multiple previous exams. FINDINGS: CTA NECK FINDINGS Aortic arch: Visualized aortic arch normal in caliber with normal branch pattern. No stenosis about the origin of the great vessels. Right carotid system: Right common and internal carotid arteries widely patent without stenosis, dissection or occlusion. Left carotid system: Left common and internal carotid arteries widely patent without stenosis, dissection or occlusion. Vertebral arteries: Both vertebral arteries arise from the subclavian arteries. Strongly dominant right vertebral artery with a diffusely hypoplastic left vertebral artery. Vertebral arteries widely patent within the neck without stenosis, dissection or occlusion. Skeleton: No visible acute osseous finding. No discrete or worrisome osseous lesions. Mild to moderate cervical spondylosis at C5-6 and C6-7. Other neck: No other acute soft tissue abnormality within the neck. No mass or adenopathy. Upper chest: Visualized upper chest demonstrates no acute finding. Surgical clips noted about the anterior mediastinum. Review of the MIP images confirms the above findings CTA HEAD FINDINGS Anterior circulation: Both internal  carotid arteries widely patent to the termini without stenosis. A1 segments widely patent. Normal anterior communicating artery complex. Anterior cerebral arteries widely  patent to their distal aspects. No M1 stenosis or occlusion. Normal MCA bifurcations. On the left, there is acute occlusion of a left M3 branch, superior division (series 7, image 95). Remainder of the left MCA branches are perfused. Distal right MCA branches well perfused as well. Posterior circulation: Dominant right V4 segment widely patent to the vertebrobasilar junction. Hypoplastic left vertebral artery terminates in PICA. Both PICA origins patent and normal. Basilar patent to its distal aspect without stenosis. Superior cerebellar arteries patent bilaterally. Both PCAs primarily supplied via the basilar well perfused to their distal aspects. Venous sinuses: Patent. Anatomic variants: Hypoplastic left vertebral artery terminates in PICA. No aneurysm. Review of the MIP images confirms the above findings IMPRESSION: 1. Acute left M3 branch occlusion, superior division. 2. Otherwise negative CTA of the head and neck. No other large vessel occlusion, hemodynamically significant stenosis, or other acute vascular abnormality. Critical Value/emergent results were discussed by telephone at the time of interpretation on 09/25/2020 at 9:25 pm with provider ASHISH ARORA. Electronically Signed   By: Rise Mu M.D.   On: 09/25/2020 21:45   MR ANGIO HEAD WO CONTRAST  Result Date: 09/26/2020 CLINICAL DATA:  Follow-up examination for acute stroke, previous MCA branch occlusion, status post tPA and catheter directed revascularization. EXAM: MRI HEAD WITHOUT CONTRAST MRA HEAD WITHOUT CONTRAST TECHNIQUE: Multiplanar, multiecho pulse sequences of the brain and surrounding structures were obtained without intravenous contrast. Angiographic images of the head were obtained using MRA technique without contrast. COMPARISON:  Prior studies from 09/25/2020 FINDINGS: MRI HEAD FINDINGS Brain: Cerebral volume within normal limits for age. Minimal scattered T2/FLAIR hyperintensity noted within the periventricular white  matter, most likely related chronic microvascular ischemic disease, minimal for age. Area of confluent restricted diffusion seen involving the anterior left frontal region with mild involvement of the underlying anterior left insular cortex, consistent with acute left MCA distribution infarct (series 3, image 32). Associated early gyral swelling and edema within the area of infarction without significant regional mass effect. Subcentimeter focus of susceptibility artifact at the left frontal operculum/external capsule likely reflects a small focus of petechial hemorrhage (series 8, image 13). No frank malignant hemorrhagic transformation. No other evidence for acute or subacute ischemia. Gray-white matter differentiation otherwise maintained. No other areas of chronic cortical infarction. No other evidence for acute or chronic intracranial hemorrhage. No mass lesion, midline shift or mass effect. No hydrocephalus or extra-axial fluid collection. Pituitary gland suprasellar region normal. Midline structures intact. Vascular: Major intracranial vascular flow voids are well maintained. Skull and upper cervical spine: Craniocervical junction within normal limits. Bone marrow signal intensity normal. No focal marrow replacing lesion. No scalp soft tissue abnormality. Sinuses/Orbits: Globes and orbital soft tissues within normal limits. Left maxillary sinus retention cysts noted. Additional mild mucosal thickening elsewhere within the paranasal sinuses. No mastoid effusion. Inner ear structures within normal limits. Other: None. MRA HEAD FINDINGS ANTERIOR CIRCULATION: Visualized distal cervical segments of the internal carotid arteries are patent with antegrade flow. Petrous, cavernous, and supraclinoid segments widely patent without stenosis or other abnormality. A1 segments widely patent. Normal anterior communicating artery complex. Anterior cerebral arteries widely patent to their distal aspects. No M1 stenosis or  occlusion. Normal MCA bifurcations. Interval revascularization of previously identified left MCA branch occlusion. MCA branches now well perfused and patent bilaterally. POSTERIOR CIRCULATION: Dominant right vertebral artery widely patent to the vertebrobasilar junction.  Left vertebral artery markedly hypoplastic and terminates in PICA. Both PICA patent. Basilar patent to its distal aspect without stenosis. Superior cerebellar arteries patent bilaterally. Both PCAs primarily supplied via the basilar well perfused to their distal aspects. No intracranial aneurysm. IMPRESSION: MRI HEAD IMPRESSION: 1. Acute ischemic left MCA distribution infarct as above. Subcentimeter focus of susceptibility artifact consistent with a small focus of petechial hemorrhage. No frank malignant hemorrhagic transformation or significant mass effect. 2. Otherwise essentially normal brain MRI for age. MRA HEAD IMPRESSION: 1. Interval revascularization of previously identified left MCA branch occlusion, now patent. 2. Otherwise stable and normal intracranial MRA. Electronically Signed   By: Rise Mu M.D.   On: 09/26/2020 23:04   MR BRAIN WO CONTRAST  Result Date: 09/26/2020 CLINICAL DATA:  Follow-up examination for acute stroke, previous MCA branch occlusion, status post tPA and catheter directed revascularization. EXAM: MRI HEAD WITHOUT CONTRAST MRA HEAD WITHOUT CONTRAST TECHNIQUE: Multiplanar, multiecho pulse sequences of the brain and surrounding structures were obtained without intravenous contrast. Angiographic images of the head were obtained using MRA technique without contrast. COMPARISON:  Prior studies from 09/25/2020 FINDINGS: MRI HEAD FINDINGS Brain: Cerebral volume within normal limits for age. Minimal scattered T2/FLAIR hyperintensity noted within the periventricular white matter, most likely related chronic microvascular ischemic disease, minimal for age. Area of confluent restricted diffusion seen involving the  anterior left frontal region with mild involvement of the underlying anterior left insular cortex, consistent with acute left MCA distribution infarct (series 3, image 32). Associated early gyral swelling and edema within the area of infarction without significant regional mass effect. Subcentimeter focus of susceptibility artifact at the left frontal operculum/external capsule likely reflects a small focus of petechial hemorrhage (series 8, image 13). No frank malignant hemorrhagic transformation. No other evidence for acute or subacute ischemia. Gray-white matter differentiation otherwise maintained. No other areas of chronic cortical infarction. No other evidence for acute or chronic intracranial hemorrhage. No mass lesion, midline shift or mass effect. No hydrocephalus or extra-axial fluid collection. Pituitary gland suprasellar region normal. Midline structures intact. Vascular: Major intracranial vascular flow voids are well maintained. Skull and upper cervical spine: Craniocervical junction within normal limits. Bone marrow signal intensity normal. No focal marrow replacing lesion. No scalp soft tissue abnormality. Sinuses/Orbits: Globes and orbital soft tissues within normal limits. Left maxillary sinus retention cysts noted. Additional mild mucosal thickening elsewhere within the paranasal sinuses. No mastoid effusion. Inner ear structures within normal limits. Other: None. MRA HEAD FINDINGS ANTERIOR CIRCULATION: Visualized distal cervical segments of the internal carotid arteries are patent with antegrade flow. Petrous, cavernous, and supraclinoid segments widely patent without stenosis or other abnormality. A1 segments widely patent. Normal anterior communicating artery complex. Anterior cerebral arteries widely patent to their distal aspects. No M1 stenosis or occlusion. Normal MCA bifurcations. Interval revascularization of previously identified left MCA branch occlusion. MCA branches now well perfused  and patent bilaterally. POSTERIOR CIRCULATION: Dominant right vertebral artery widely patent to the vertebrobasilar junction. Left vertebral artery markedly hypoplastic and terminates in PICA. Both PICA patent. Basilar patent to its distal aspect without stenosis. Superior cerebellar arteries patent bilaterally. Both PCAs primarily supplied via the basilar well perfused to their distal aspects. No intracranial aneurysm. IMPRESSION: MRI HEAD IMPRESSION: 1. Acute ischemic left MCA distribution infarct as above. Subcentimeter focus of susceptibility artifact consistent with a small focus of petechial hemorrhage. No frank malignant hemorrhagic transformation or significant mass effect. 2. Otherwise essentially normal brain MRI for age. MRA HEAD IMPRESSION: 1. Interval revascularization of  previously identified left MCA branch occlusion, now patent. 2. Otherwise stable and normal intracranial MRA. Electronically Signed   By: Rise Mu M.D.   On: 09/26/2020 23:04   IR CT Head Ltd  Result Date: 09/27/2020 INDICATION: New onset left gaze deviation, wrist and hand paresthesias and aphasia. Occluded left middle cerebral artery superior division on CT angiogram of the head and neck. EXAM: 1. EMERGENT LARGE VESSEL OCCLUSION THROMBOLYSIS (anterior CIRCULATION) COMPARISON:  CT angiogram of the head and neck of September 25, 2020. MEDICATIONS: Ancef 2 g IV antibiotic was administered within 1 hour of the procedure. ANESTHESIA/SEDATION: General anesthesia CONTRAST:  Isovue 300 approximately 50 mL FLUOROSCOPY TIME:  Fluoroscopy Time: 28 minutes 30 seconds (1,100 mGy). COMPLICATIONS: None immediate. TECHNIQUE: Following a full explanation of the procedure along with the potential associated complications, an informed witnessed consent was obtained. The risks of intracranial hemorrhage of 10%, worsening neurological deficit, ventilator dependency, death and inability to revascularize were all reviewed in detail with the  patient's spouse. The patient was then put under general anesthesia by the Department of Anesthesiology at [facility]. The right groin was prepped and draped in the usual sterile fashion. Thereafter using modified Seldinger technique, transfemoral access into the right common femoral artery was obtained without difficulty. Over a 0.035 inch guidewire an 8 French 25 cm Pinnacle sheath was inserted. Through this, and also over a 0.035 inch guidewire a 5 French Simmons 2 support catheter inside of an 087 balloon guide catheter was advanced to the aortic arch region and selectively cannulated in the left common carotid artery to the aortic arch region and selectively positioned in the left common carotid artery at the bifurcation. FINDINGS: The left common carotid arteriogram demonstrates the left external carotid artery and its major branches to be widely patent. The left internal carotid artery at the bulb to the cranial skull base is widely patent. The petrous, the cavernous and the supraclinoid segments are widely patent. The left anterior cerebral artery opacifies into the capillary and venous phases. The left middle cerebral artery opacifies into the capillary and venous phases. Demonstrated is occlusion of the superior division of the left middle cerebral artery in its mid M2 segment. A prominent area of hypoperfusion is demonstrated on the delayed arterial and capillary phases. PROCEDURE: Through the balloon guide catheter, a combination of an 055 136 cm Zoom aspiration catheter inside of which was a 35 160 cm Zoom aspiration Trevo ProVue microcatheter was advanced to the M1 segment of the left middle cerebral artery. The micro guidewire was then gently manipulated and advanced through the occluded superior division projecting medially and then laterally followed by the 35 Zoom aspiration catheter which was buried in the proximal occluded segment. The guidewire was removed. There was no aspiration at  the hub of the 35 Zoom aspiration catheter. Constant aspiration was then applied with a 20 mL syringe at the hub of the 35 Zoom aspiration catheter, and also at the 55 Zoom aspiration catheter with a Penumbra aspiration device. The Zoom 55 had been advanced into the mid superior division. After approximately 2 minutes, the combination of the 35 and the 55 Zoom aspiration catheters was retrieved and removed. Proximal flow arrest was reversed. A control arteriogram performed through the balloon guide catheter now in the distal left internal carotid artery demonstrated minimal change in the occluded distal superior division. A second pass was then made again using the above combination. The 35 Zoom aspiration catheter was advanced just proximal to the  occlusion. A gentle control arteriogram performed through this demonstrated a large filling defect just distal to this extending into the M3 segment. An Aristotle 014 inch micro guidewire was then introduced through the 35 Zoom aspiration catheter. The guidewire was advanced more distally into the M3 M4 region followed by the 35 Zoom aspiration catheter in the distal M3 segment. Aspiration was then applied with a 20 mL syringe at the 55 Zoom aspiration catheter hub, and also at the hub of the 55 Zoom aspiration catheter which was then advanced more distally with proximal flow arrest. After approximately 2 minutes, the combination of the 35 Zoom and the 55 Zoom aspiration catheter was retrieved and removed. Flow arrest was reversed. Control arteriogram performed through the balloon guide in the distal left ICA demonstrated now near complete revascularization of the superior division of the left middle cerebral artery. There was occlusion in the distal M4 region of this branch with a focal area of hypoperfusion. A TICI 2C revascularization was achieved. Balloon guide was then retrieved into the left common carotid artery. A control arteriogram performed through this  demonstrated patency of the left ICA extra cranially and intracranially. The left anterior cerebral artery remained widely patent. The left middle cerebral artery distribution demonstrated no evidence of intraluminal filling defects or of occlusions except for the distal M4 occlusion. The balloon guide was removed. An 8 French Angio-Seal closure device was applied for hemostasis in the right groin puncture site. Distal pulses remained Dopplerable in all fours unchanged. A CT scan of the brain demonstrated no evidence of gross hemorrhage or mass effect or midline shift. A less than 1 cm area of contrast blush was noted in the subcortical mid temporal/parietal region. Patient's general anesthesia was then reversed and the patient was extubated. Upon recovery the patient was able to obey simple instructions appropriately. She had minimal weakness in the right upper extremity with a right facial droop. She exhibited no verbal output. She was then transferred to neuro ICU for post revascularization management. IMPRESSION: Status post near complete revascularization of occluded superior division of the left middle cerebral artery and the mid M2 segment with 2 passes with contact aspiration and proximal aspiration achieving a TICI 2C revascularization. PLAN: Follow-up in the clinic approximately 4 weeks post discharge. Electronically Signed   By: Julieanne Cotton M.D.   On: 09/26/2020 10:28   ECHOCARDIOGRAM COMPLETE  Result Date: 09/26/2020    ECHOCARDIOGRAM REPORT   Patient Name:   [name] Date of Exam: 09/26/2020 Medical Rec #:  [id number]      Height:       65.0 in Accession #:    [id number]     Weight:       195.1 lb Date of Birth:  08-31-1970      BSA:          1.957 m Patient Age:    49 years       BP:           110/62 mmHg Patient Gender: F              HR:           74 bpm. Exam Location:  Inpatient Procedure: 2D Echo, Cardiac Doppler and Color Doppler Indications:    Stroke  History:        Patient has  prior history of Echocardiogram examinations, most                 recent 06/03/2020. Risk Factors:Former  Smoker. H/O PFO closure                 30 years ago.  Sonographer:    Ross Ludwig RDCS (AE) Referring Phys: [id number] ASHISH ARORA IMPRESSIONS  1. Left ventricular ejection fraction, by estimation, is 60 to 65%. The left ventricle has normal function. The left ventricle has no regional wall motion abnormalities. Left ventricular diastolic parameters were normal.  2. Right ventricular systolic function is normal. The right ventricular size is normal. There is normal pulmonary artery systolic pressure.  3. The atrial septum appears intact, without residual shunt, without a visible closure device (surgical closure?). It tends to bow right to left, but there are no other signs of high right atrial pressure.  4. The mitral valve is normal in structure. No evidence of mitral valve regurgitation. No evidence of mitral stenosis.  5. The aortic valve is normal in structure. Aortic valve regurgitation is not visualized. No aortic stenosis is present.  6. The inferior vena cava is normal in size with greater than 50% respiratory variability, suggesting right atrial pressure of 3 mmHg. Comparison(s): A prior study was performed on 03/13/2020. The small saline contrast shunt seen on TEE was not demonstrated on the current study. FINDINGS  Left Ventricle: Left ventricular ejection fraction, by estimation, is 60 to 65%. The left ventricle has normal function. The left ventricle has no regional wall motion abnormalities. The left ventricular internal cavity size was normal in size. There is  no left ventricular hypertrophy. Left ventricular diastolic parameters were normal. Indeterminate filling pressures. Right Ventricle: The right ventricular size is normal. No increase in right ventricular wall thickness. Right ventricular systolic function is normal. There is normal pulmonary artery systolic pressure. The tricuspid  regurgitant velocity is 2.37 m/s, and  with an assumed right atrial pressure of 8 mmHg, the estimated right ventricular systolic pressure is 30.5 mmHg. Left Atrium: Left atrial size was normal in size. Right Atrium: Right atrial size was normal in size. Pericardium: There is no evidence of pericardial effusion. Mitral Valve: The mitral valve is normal in structure. No evidence of mitral valve regurgitation. No evidence of mitral valve stenosis. MV peak gradient, 3.1 mmHg. The mean mitral valve gradient is 1.0 mmHg. Tricuspid Valve: The tricuspid valve is normal in structure. Tricuspid valve regurgitation is not demonstrated. No evidence of tricuspid stenosis. Aortic Valve: The aortic valve is normal in structure. Aortic valve regurgitation is not visualized. No aortic stenosis is present. Aortic valve mean gradient measures 5.0 mmHg. Aortic valve peak gradient measures 9.7 mmHg. Aortic valve area, by VTI measures 1.89 cm. Pulmonic Valve: The pulmonic valve was normal in structure. Pulmonic valve regurgitation is not visualized. No evidence of pulmonic stenosis. Aorta: The aortic root is normal in size and structure. Venous: The inferior vena cava is normal in size with greater than 50% respiratory variability, suggesting right atrial pressure of 3 mmHg. IAS/Shunts: No atrial level shunt detected by color flow Doppler.  LEFT VENTRICLE PLAX 2D LVIDd:         4.20 cm  Diastology LVIDs:         3.10 cm  LV e' medial:    5.63 cm/s LV PW:         1.00 cm  LV E/e' medial:  16.2 LV IVS:        1.00 cm  LV e' lateral:   8.25 cm/s LVOT diam:     1.90 cm  LV E/e' lateral: 11.1 LV SV:  61 LV SV Index:   31 LVOT Area:     2.84 cm  RIGHT VENTRICLE             IVC RV Basal diam:  2.60 cm     IVC diam: 1.60 cm RV S prime:     10.10 cm/s TAPSE (M-mode): 2.5 cm LEFT ATRIUM             Index       RIGHT ATRIUM           Index LA diam:        2.60 cm 1.33 cm/m  RA Area:     13.10 cm LA Vol (A2C):   47.0 ml 24.01 ml/m RA  Volume:   26.20 ml  13.38 ml/m LA Vol (A4C):   44.3 ml 22.63 ml/m LA Biplane Vol: 48.5 ml 24.78 ml/m  AORTIC VALVE AV Area (Vmax):    2.00 cm AV Area (Vmean):   2.09 cm AV Area (VTI):     1.89 cm AV Vmax:           156.00 cm/s AV Vmean:          95.700 cm/s AV VTI:            0.323 m AV Peak Grad:      9.7 mmHg AV Mean Grad:      5.0 mmHg LVOT Vmax:         110.00 cm/s LVOT Vmean:        70.600 cm/s LVOT VTI:          0.215 m LVOT/AV VTI ratio: 0.67  AORTA Ao Root diam: 3.00 cm Ao Asc diam:  2.80 cm MITRAL VALVE               TRICUSPID VALVE MV Area (PHT): 2.22 cm    TR Peak grad:   22.5 mmHg MV Area VTI:   2.00 cm    TR Vmax:        237.00 cm/s MV Peak grad:  3.1 mmHg MV Mean grad:  1.0 mmHg    SHUNTS MV Vmax:       0.87 m/s    Systemic VTI:  0.22 m MV Vmean:      51.4 cm/s   Systemic Diam: 1.90 cm MV Decel Time: 342 msec MV E velocity: 91.30 cm/s MV A velocity: 74.60 cm/s MV E/A ratio:  1.22 Mihai Croitoru MD Electronically signed by Thurmon Fair MD Signature Date/Time: 09/26/2020/1:53:17 PM    Final    IR PERCUTANEOUS ART THROMBECTOMY/INFUSION INTRACRANIAL INC DIAG ANGIO  Result Date: 09/27/2020 INDICATION: New onset left gaze deviation, wrist and hand paresthesias and aphasia. Occluded left middle cerebral artery superior division on CT angiogram of the head and neck. EXAM: 1. EMERGENT LARGE VESSEL OCCLUSION THROMBOLYSIS (anterior CIRCULATION) COMPARISON:  CT angiogram of the head and neck of September 25, 2020. MEDICATIONS: Ancef 2 g IV antibiotic was administered within 1 hour of the procedure. ANESTHESIA/SEDATION: General anesthesia CONTRAST:  Isovue 300 approximately 50 mL FLUOROSCOPY TIME:  Fluoroscopy Time: 28 minutes 30 seconds (1,100 mGy). COMPLICATIONS: None immediate. TECHNIQUE: Following a full explanation of the procedure along with the potential associated complications, an informed witnessed consent was obtained. The risks of intracranial hemorrhage of 10%, worsening neurological deficit,  ventilator dependency, death and inability to revascularize were all reviewed in detail with the patient's spouse. The patient was then put under general anesthesia by the Department of Anesthesiology at [facility]. The right groin  was prepped and draped in the usual sterile fashion. Thereafter using modified Seldinger technique, transfemoral access into the right common femoral artery was obtained without difficulty. Over a 0.035 inch guidewire an 8 French 25 cm Pinnacle sheath was inserted. Through this, and also over a 0.035 inch guidewire a 5 French Simmons 2 support catheter inside of an 087 balloon guide catheter was advanced to the aortic arch region and selectively cannulated in the left common carotid artery to the aortic arch region and selectively positioned in the left common carotid artery at the bifurcation. FINDINGS: The left common carotid arteriogram demonstrates the left external carotid artery and its major branches to be widely patent. The left internal carotid artery at the bulb to the cranial skull base is widely patent. The petrous, the cavernous and the supraclinoid segments are widely patent. The left anterior cerebral artery opacifies into the capillary and venous phases. The left middle cerebral artery opacifies into the capillary and venous phases. Demonstrated is occlusion of the superior division of the left middle cerebral artery in its mid M2 segment. A prominent area of hypoperfusion is demonstrated on the delayed arterial and capillary phases. PROCEDURE: Through the balloon guide catheter, a combination of an 055 136 cm Zoom aspiration catheter inside of which was a 35 160 cm Zoom aspiration Trevo ProVue microcatheter was advanced to the M1 segment of the left middle cerebral artery. The micro guidewire was then gently manipulated and advanced through the occluded superior division projecting medially and then laterally followed by the 35 Zoom aspiration catheter which was  buried in the proximal occluded segment. The guidewire was removed. There was no aspiration at the hub of the 35 Zoom aspiration catheter. Constant aspiration was then applied with a 20 mL syringe at the hub of the 35 Zoom aspiration catheter, and also at the 55 Zoom aspiration catheter with a Penumbra aspiration device. The Zoom 55 had been advanced into the mid superior division. After approximately 2 minutes, the combination of the 35 and the 55 Zoom aspiration catheters was retrieved and removed. Proximal flow arrest was reversed. A control arteriogram performed through the balloon guide catheter now in the distal left internal carotid artery demonstrated minimal change in the occluded distal superior division. A second pass was then made again using the above combination. The 35 Zoom aspiration catheter was advanced just proximal to the occlusion. A gentle control arteriogram performed through this demonstrated a large filling defect just distal to this extending into the M3 segment. An Aristotle 014 inch micro guidewire was then introduced through the 35 Zoom aspiration catheter. The guidewire was advanced more distally into the M3 M4 region followed by the 35 Zoom aspiration catheter in the distal M3 segment. Aspiration was then applied with a 20 mL syringe at the 55 Zoom aspiration catheter hub, and also at the hub of the 55 Zoom aspiration catheter which was then advanced more distally with proximal flow arrest. After approximately 2 minutes, the combination of the 35 Zoom and the 55 Zoom aspiration catheter was retrieved and removed. Flow arrest was reversed. Control arteriogram performed through the balloon guide in the distal left ICA demonstrated now near complete revascularization of the superior division of the left middle cerebral artery. There was occlusion in the distal M4 region of this branch with a focal area of hypoperfusion. A TICI 2C revascularization was achieved. Balloon guide was then  retrieved into the left common carotid artery. A control arteriogram performed through this demonstrated patency of the  left ICA extra cranially and intracranially. The left anterior cerebral artery remained widely patent. The left middle cerebral artery distribution demonstrated no evidence of intraluminal filling defects or of occlusions except for the distal M4 occlusion. The balloon guide was removed. An 8 French Angio-Seal closure device was applied for hemostasis in the right groin puncture site. Distal pulses remained Dopplerable in all fours unchanged. A CT scan of the brain demonstrated no evidence of gross hemorrhage or mass effect or midline shift. A less than 1 cm area of contrast blush was noted in the subcortical mid temporal/parietal region. Patient's general anesthesia was then reversed and the patient was extubated. Upon recovery the patient was able to obey simple instructions appropriately. She had minimal weakness in the right upper extremity with a right facial droop. She exhibited no verbal output. She was then transferred to neuro ICU for post revascularization management. IMPRESSION: Status post near complete revascularization of occluded superior division of the left middle cerebral artery and the mid M2 segment with 2 passes with contact aspiration and proximal aspiration achieving a TICI 2C revascularization. PLAN: Follow-up in the clinic approximately 4 weeks post discharge. Electronically Signed   By: Julieanne Cotton M.D.   On: 09/26/2020 10:28   CT HEAD CODE STROKE WO CONTRAST  Result Date: 09/25/2020 CLINICAL DATA:  Code stroke. Initial evaluation for acute right-sided facial droop, right arm drift. EXAM: CT HEAD WITHOUT CONTRAST TECHNIQUE: Contiguous axial images were obtained from the base of the skull through the vertex without intravenous contrast. COMPARISON:  Prior MRI from 06/04/2020. FINDINGS: Brain: Cerebral volume within normal limits. No acute intracranial hemorrhage.  No acute large vessel territory infarct. No mass lesion, midline shift or mass effect. No hydrocephalus or extra-axial fluid collection. Vascular: No hyperdense vessel. Skull: Scalp soft tissues and calvarium within normal limits. Sinuses/Orbits: Left gaze noted. Globes and orbital soft tissues otherwise unremarkable. Left maxillary sinus retention cyst. Additional scattered mucosal thickening noted within the ethmoidal air cells and maxillary sinuses. Paranasal sinuses are otherwise clear. No mastoid effusion. Other: None. ASPECTS [facility] Stroke Program Early CT Score) - Ganglionic level infarction (caudate, lentiform nuclei, internal capsule, insula, M1-M3 cortex): 7 - Supraganglionic infarction (M4-M6 cortex): 3 Total score (0-10 with 10 being normal): 10 IMPRESSION: 1. Negative head CT.  No acute intracranial abnormality. 2. ASPECTS is 10. These results were communicated to Dr. Wilford Corner at 9:25 pmon 3/24/2022by text page via the [facility] messaging system. Electronically Signed   By: Rise Mu M.D.   On: 09/25/2020 21:30    PHYSICAL EXAM  Temp:  [98.2 F (36.8 C)-98.7 F (37.1 C)] 98.2 F (36.8 C) (03/28 0818) Pulse Rate:  [63-79] 70 (03/28 0818) Resp:  [11-16] 15 (03/28 0818) BP: (108-122)/(52-72) 110/55 (03/28 0818) SpO2:  [94 %-98 %] 95 % (03/28 0818)  General - Well nourished, well developed, in no apparent distress.  Ophthalmologic - fundi not visualized due to noncooperation.  Cardiovascular - Regular rhythm and rate.  Mental Status -  Awake alert, able to follow simple commands.   Language: Expressive aphasia without spontaneous speech, follows simple commands reliably and appropriately.  Considerable word hesitancy and paraphasic errors.  Gets frustrated.  Comprehension appears much better preserved.  Difficulty with naming interpretation  Cranial Nerves II - XII - II - Visual field intact OU. III, IV, VI - Extraocular movements intact. V - Facial sensation intact  bilaterally. VII - slight right facial nasolabial fold flattening VIII - Hearing & vestibular intact bilaterally. X - Palate elevates symmetrically.  XI - Chin turning & shoulder shrug intact bilaterally. XII - Tongue protrusion intact.  Motor Strength - The patient's strength was normal in all extremities and pronator drift was absent.  Bulk was normal and fasciculations were absent.   Motor Tone - Muscle tone was assessed at the neck and appendages and was normal.  Reflexes - The patient's reflexes were symmetrical in all extremities and she had no pathological reflexes.  Sensory - Light touch, temperature/pinprick were assessed and were symmetrical.    Coordination - The patient had normal movements in the hands with no ataxia or dysmetria.  Tremor was absent.  Gait and Station - deferred.   ASSESSMENT/PLAN Ms. [name] is a 50 y.o. 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 TPA given.  Status post thrombectomy with left MCA TICI2c.  Stroke:  left MCA infarct embolic secondary to unclear source  Resultant expressive aphasia  CT head no acute abnormality  CTA of the neck left M2/M3 occlusion  IR left M2 occlusion status post TICI 2c reperfusion  MRI left frontal acute infarct  MRA Interval revascularization of previously identified left MCA branch occlusion, now patent. 2. Otherwise stable and normal intracranial MRA.  2D Echo EF 60 to 65%, atrial septum no residual shunt  Loop recorder interrogation pending  LDL 23, TG 231  HgbA1c 5.4  SCDs for VTE prophylaxis  clopidogrel 75 mg daily prior to admission, now on No antithrombotic within 24 hours TPA.   ASA 81 mg and Brillinta x 4 weeks.   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 DAPT ->aspirin only 05/20/2020.CT head and neck negative. EF 50 to 55%. TCD bubble study negative for PFO and LE venous Doppler negative for DVT. Hypercoagulable and autoimmune work-up negative.  Initial TEE concerning for possible IA shunt (previously closed ASD).However, on review from Dr. Excell Seltzerooper, no significant IA shunt  Cardiac CT 11/19 no CAD or PFO orIA shunt  loop placed 05/20/2020  06/2020 small right frontal lobe infarct, status post TPA, unclear etiology, consider recurrent cryptogenic stroke.  CT no acute abnormality, MRI small right frontal precentral gyrus infarct.  MRA head and neck no LVO.  EF 60 to 65%.  Loop recorder interrogation no A. fib.  Patient declined LP.  Cerebral angiogram negative for vasculitis.  LDL 64, A1c 5.3.  Discharged with DAPT aspirin 81 and Plavix 75.  History of ASD  [facility]   small IA shunt seen during TEE 03/2020, but not significantby Cooper   Cardiac CT 05/2020 noIA shunt  Hyperlipidemia  Home meds:lipitor 20  LDL 23, goal < 70  Decrease Lipitor to 10 mg daily given low LDL  Continue statin at discharge  Hashimoto'sthyroiditis  Diagnosed 17 years ago  03/2020, TSH6.13 (H), but FT4 normal, Thyroid peroxidase Ab55 (H), Thyroglobulin Ab- 1.5 (H)  06/2020 TSHnormaland free T4normal  Other Stroke Risk Factors  FormerCigarette smoker  MildETOH use(1/wk), advised to drink no more than 1 drink(s) a day  Obesity,Body mass index is 32.47 kg/m., BMI >/= 30 associated with increased stroke risk, recommend weight loss, diet and exercise as appropriate  Hxmenstrualmigraines  Other Active Problems  Elevated TG 231  Episodic headaches at baseline Responded well to usual medication in the hospital  today.  Hospital day # 4 I have personally obtained history,examined this patient, reviewed notes, independently viewed imaging studies, participated in medical decision making and plan of care.ROS completed by me personally and pertinent positives fully documented  I have made any additions or clarifications directly to the above note. Agree with note above.  Patient has presented with recurrent cryptogenic stroke with remote history of surgical ASD closure with TEE during the last admission showing small right to left but this was not detected on the TCD bubble study.  May consider repeating TCD bubble study as an outpatient.  Recommend aspirin and Brilinta for 4 weeks followed by aspirin alone.  Patient advised to consider possible participation in the [employer] trial if interested.  She will be given information to review at home site.  Follow-up as an outpatient stroke clinic in 6 weeks.  Long discussion with patient and husband and answered questions.  Greater than 50% time during this 35-minute visit was spent on counseling and coordination of care about her cryptogenic stroke  Delia Heady, MD Medical Director [facility] Pager: [phone] 09/29/2020 3:49 PM   To contact Stroke Continuity provider, please refer to [email or web address]. After hours, contact General Neurology

## 2020-09-29 NOTE — Discharge Summary (Addendum)
Stroke Discharge Summary  Patient ID: norma montemurro   MRN: [id number]      DOB: Apr 19, 1971  Date of Admission: 09/25/2020 Date of Discharge: 09/29/2020  Attending Physician:  Micki Riley, MD, Stroke MD Consultant(s):    None Patient's PCP:  Judy Pimple, MD  DISCHARGE DIAGNOSIS:  Active Problems:   Acute ischemic stroke (HCC) 1. 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 Severe expressive aphasia 3. S/p administration of  tPA 4. S/p left MCA with TICI2c result  Allergies as of 09/29/2020       Reactions   Bee Venom Swelling        Medication List     STOP taking these medications    clopidogrel 75 MG tablet Commonly known as: PLAVIX       TAKE these medications    aspirin 81 MG EC tablet Take 1 tablet (81 mg total) by mouth daily. Swallow whole. Start taking on: September 30, 2020   atorvastatin 20 MG tablet Commonly known as: LIPITOR Take 1 tablet (20 mg total) by mouth daily.   pantoprazole 40 MG tablet Commonly known as: PROTONIX Take 1 tablet (40 mg total) by mouth daily. Start taking on: September 30, 2020   Rimegepant Sulfate 75 MG Tbdp Take 75 mg by mouth once as needed for migraine.   ticagrelor 90 MG Tabs tablet Commonly known as: BRILINTA Take 1 tablet (90 mg total) by mouth 2 (two) times daily.        LABORATORY STUDIES CBC    Component Value Date/Time   WBC 8.8 09/25/2020 2118   RBC 4.10 09/25/2020 2118   HGB 13.1 09/25/2020 2118   HGB 12.6 09/25/2020 2118   HGB 12.9 08/01/2020 1405   HCT 38.9 09/25/2020 2118   HCT 37.0 09/25/2020 2118   PLT 220 09/25/2020 2118   PLT 282 08/01/2020 1405   MCV 94.9 09/25/2020 2118   MCH 32.0 09/25/2020 2118   MCHC 33.7 09/25/2020 2118   RDW 12.4 09/25/2020 2118   LYMPHSABS 2.5 09/25/2020 2118   MONOABS 0.9 09/25/2020 2118   EOSABS 0.4 09/25/2020 2118   BASOSABS 0.1 09/25/2020 2118   CMP    Component Value Date/Time   NA 136 09/25/2020  2118   NA 139 09/25/2020 2118   NA 138 05/12/2020 1652   K 3.3 (L) 09/25/2020 2118   K 3.3 (L) 09/25/2020 2118   CL 104 09/25/2020 2118   CL 104 09/25/2020 2118   CO2 22 09/25/2020 2118   GLUCOSE 121 (H) 09/25/2020 2118   GLUCOSE 120 (H) 09/25/2020 2118   BUN 16 09/25/2020 2118   BUN 19 09/25/2020 2118   BUN 10 05/12/2020 1652   CREATININE 0.71 09/25/2020 2118   CREATININE 0.70 09/25/2020 2118   CREATININE 0.75 08/01/2020 1405   CALCIUM 8.7 (L) 09/25/2020 2118   PROT 6.8 09/25/2020 2118   ALBUMIN 3.7 09/25/2020 2118   AST 25 09/25/2020 2118   AST 14 (L) 08/01/2020 1405   ALT 24 09/25/2020 2118   ALT 19 08/01/2020 1405   ALKPHOS 69 09/25/2020 2118   BILITOT 0.5 09/25/2020 2118   BILITOT 0.5 08/01/2020 1405   GFRNONAA >60 09/25/2020 2118   GFRNONAA >60 08/01/2020 1405   GFRAA 106 05/12/2020 1652   COAGS Lab Results  Component Value Date   INR 1.0 09/25/2020   INR 0.9 06/03/2020   INR 0.9 03/11/2020   Lipid Panel  Component Value Date/Time   CHOL 109 09/26/2020 0317   CHOL 160 05/20/2020 0909   TRIG 231 (H) 09/26/2020 0317   HDL 40 (L) 09/26/2020 0317   HDL 56 05/20/2020 0909   CHOLHDL 2.7 09/26/2020 0317   VLDL 46 (H) 09/26/2020 0317   LDLCALC 23 09/26/2020 0317   LDLCALC 80 05/20/2020 0909   HgbA1C  Lab Results  Component Value Date   HGBA1C 5.4 09/26/2020   Urinalysis    Component Value Date/Time   COLORURINE STRAW (A) 06/03/2020 2133   APPEARANCEUR CLEAR 06/03/2020 2133   LABSPEC 1.011 06/03/2020 2133   PHURINE 7.0 06/03/2020 2133   GLUCOSEU NEGATIVE 06/03/2020 2133   HGBUR SMALL (A) 06/03/2020 2133   BILIRUBINUR NEGATIVE 06/03/2020 2133   BILIRUBINUR neg 12/22/2011 1538   KETONESUR 5 (A) 06/03/2020 2133   PROTEINUR NEGATIVE 06/03/2020 2133   UROBILINOGEN 0.2 12/22/2011 1538   NITRITE NEGATIVE 06/03/2020 2133   LEUKOCYTESUR NEGATIVE 06/03/2020 2133   Urine Drug Screen     Component Value Date/Time   LABOPIA NONE DETECTED 03/12/2020 1226    COCAINSCRNUR NONE DETECTED 03/12/2020 1226   LABBENZ NONE DETECTED 03/12/2020 1226   AMPHETMU NONE DETECTED 03/12/2020 1226   THCU NONE DETECTED 03/12/2020 1226   LABBARB NONE DETECTED 03/12/2020 1226    Alcohol Level No results found for: ETH   SIGNIFICANT DIAGNOSTIC STUDIES CT ANGIO HEAD W OR WO CONTRAST  Result Date: 09/25/2020 CLINICAL DATA:  Initial evaluation for acute stroke, right-sided facial droop, right arm drift. EXAM: CT ANGIOGRAPHY HEAD AND NECK TECHNIQUE: Multidetector CT imaging of the head and neck was performed using the standard protocol during bolus administration of intravenous contrast. Multiplanar CT image reconstructions and MIPs were obtained to evaluate the vascular anatomy. Carotid stenosis measurements (when applicable) are obtained utilizing NASCET criteria, using the distal internal carotid diameter as the denominator. CONTRAST:  75mL OMNIPAQUE IOHEXOL 350 MG/ML SOLN COMPARISON:  Prior CT from earlier the same day as well as multiple previous exams. FINDINGS: CTA NECK FINDINGS Aortic arch: Visualized aortic arch normal in caliber with normal branch pattern. No stenosis about the origin of the great vessels. Right carotid system: Right common and internal carotid arteries widely patent without stenosis, dissection or occlusion. Left carotid system: Left common and internal carotid arteries widely patent without stenosis, dissection or occlusion. Vertebral arteries: Both vertebral arteries arise from the subclavian arteries. Strongly dominant right vertebral artery with a diffusely hypoplastic left vertebral artery. Vertebral arteries widely patent within the neck without stenosis, dissection or occlusion. Skeleton: No visible acute osseous finding. No discrete or worrisome osseous lesions. Mild to moderate cervical spondylosis at C5-6 and C6-7. Other neck: No other acute soft tissue abnormality within the neck. No mass or adenopathy. Upper chest: Visualized upper chest  demonstrates no acute finding. Surgical clips noted about the anterior mediastinum. Review of the MIP images confirms the above findings CTA HEAD FINDINGS Anterior circulation: Both internal carotid arteries widely patent to the termini without stenosis. A1 segments widely patent. Normal anterior communicating artery complex. Anterior cerebral arteries widely patent to their distal aspects. No M1 stenosis or occlusion. Normal MCA bifurcations. On the left, there is acute occlusion of a left M3 branch, superior division (series 7, image 95). Remainder of the left MCA branches are perfused. Distal right MCA branches well perfused as well. Posterior circulation: Dominant right V4 segment widely patent to the vertebrobasilar junction. Hypoplastic left vertebral artery terminates in PICA. Both PICA origins patent and normal. Basilar patent to its  distal aspect without stenosis. Superior cerebellar arteries patent bilaterally. Both PCAs primarily supplied via the basilar well perfused to their distal aspects. Venous sinuses: Patent. Anatomic variants: Hypoplastic left vertebral artery terminates in PICA. No aneurysm. Review of the MIP images confirms the above findings IMPRESSION: 1. Acute left M3 branch occlusion, superior division. 2. Otherwise negative CTA of the head and neck. No other large vessel occlusion, hemodynamically significant stenosis, or other acute vascular abnormality. Critical Value/emergent results were discussed by telephone at the time of interpretation on 09/25/2020 at 9:25 pm with provider ASHISH ARORA. Electronically Signed   By: Rise Mu M.D.   On: 09/25/2020 21:45   CT ANGIO NECK W OR WO CONTRAST  Result Date: 09/25/2020 CLINICAL DATA:  Initial evaluation for acute stroke, right-sided facial droop, right arm drift. EXAM: CT ANGIOGRAPHY HEAD AND NECK TECHNIQUE: Multidetector CT imaging of the head and neck was performed using the standard protocol during bolus administration of  intravenous contrast. Multiplanar CT image reconstructions and MIPs were obtained to evaluate the vascular anatomy. Carotid stenosis measurements (when applicable) are obtained utilizing NASCET criteria, using the distal internal carotid diameter as the denominator. CONTRAST:  75mL OMNIPAQUE IOHEXOL 350 MG/ML SOLN COMPARISON:  Prior CT from earlier the same day as well as multiple previous exams. FINDINGS: CTA NECK FINDINGS Aortic arch: Visualized aortic arch normal in caliber with normal branch pattern. No stenosis about the origin of the great vessels. Right carotid system: Right common and internal carotid arteries widely patent without stenosis, dissection or occlusion. Left carotid system: Left common and internal carotid arteries widely patent without stenosis, dissection or occlusion. Vertebral arteries: Both vertebral arteries arise from the subclavian arteries. Strongly dominant right vertebral artery with a diffusely hypoplastic left vertebral artery. Vertebral arteries widely patent within the neck without stenosis, dissection or occlusion. Skeleton: No visible acute osseous finding. No discrete or worrisome osseous lesions. Mild to moderate cervical spondylosis at C5-6 and C6-7. Other neck: No other acute soft tissue abnormality within the neck. No mass or adenopathy. Upper chest: Visualized upper chest demonstrates no acute finding. Surgical clips noted about the anterior mediastinum. Review of the MIP images confirms the above findings CTA HEAD FINDINGS Anterior circulation: Both internal carotid arteries widely patent to the termini without stenosis. A1 segments widely patent. Normal anterior communicating artery complex. Anterior cerebral arteries widely patent to their distal aspects. No M1 stenosis or occlusion. Normal MCA bifurcations. On the left, there is acute occlusion of a left M3 branch, superior division (series 7, image 95). Remainder of the left MCA branches are perfused. Distal right MCA  branches well perfused as well. Posterior circulation: Dominant right V4 segment widely patent to the vertebrobasilar junction. Hypoplastic left vertebral artery terminates in PICA. Both PICA origins patent and normal. Basilar patent to its distal aspect without stenosis. Superior cerebellar arteries patent bilaterally. Both PCAs primarily supplied via the basilar well perfused to their distal aspects. Venous sinuses: Patent. Anatomic variants: Hypoplastic left vertebral artery terminates in PICA. No aneurysm. Review of the MIP images confirms the above findings IMPRESSION: 1. Acute left M3 branch occlusion, superior division. 2. Otherwise negative CTA of the head and neck. No other large vessel occlusion, hemodynamically significant stenosis, or other acute vascular abnormality. Critical Value/emergent results were discussed by telephone at the time of interpretation on 09/25/2020 at 9:25 pm with provider ASHISH ARORA. Electronically Signed   By: Rise Mu M.D.   On: 09/25/2020 21:45   MR ANGIO HEAD WO CONTRAST  Result Date:  09/26/2020 CLINICAL DATA:  Follow-up examination for acute stroke, previous MCA branch occlusion, status post tPA and catheter directed revascularization. EXAM: MRI HEAD WITHOUT CONTRAST MRA HEAD WITHOUT CONTRAST TECHNIQUE: Multiplanar, multiecho pulse sequences of the brain and surrounding structures were obtained without intravenous contrast. Angiographic images of the head were obtained using MRA technique without contrast. COMPARISON:  Prior studies from 09/25/2020 FINDINGS: MRI HEAD FINDINGS Brain: Cerebral volume within normal limits for age. Minimal scattered T2/FLAIR hyperintensity noted within the periventricular white matter, most likely related chronic microvascular ischemic disease, minimal for age. Area of confluent restricted diffusion seen involving the anterior left frontal region with mild involvement of the underlying anterior left insular cortex, consistent with  acute left MCA distribution infarct (series 3, image 32). Associated early gyral swelling and edema within the area of infarction without significant regional mass effect. Subcentimeter focus of susceptibility artifact at the left frontal operculum/external capsule likely reflects a small focus of petechial hemorrhage (series 8, image 13). No frank malignant hemorrhagic transformation. No other evidence for acute or subacute ischemia. Gray-white matter differentiation otherwise maintained. No other areas of chronic cortical infarction. No other evidence for acute or chronic intracranial hemorrhage. No mass lesion, midline shift or mass effect. No hydrocephalus or extra-axial fluid collection. Pituitary gland suprasellar region normal. Midline structures intact. Vascular: Major intracranial vascular flow voids are well maintained. Skull and upper cervical spine: Craniocervical junction within normal limits. Bone marrow signal intensity normal. No focal marrow replacing lesion. No scalp soft tissue abnormality. Sinuses/Orbits: Globes and orbital soft tissues within normal limits. Left maxillary sinus retention cysts noted. Additional mild mucosal thickening elsewhere within the paranasal sinuses. No mastoid effusion. Inner ear structures within normal limits. Other: None. MRA HEAD FINDINGS ANTERIOR CIRCULATION: Visualized distal cervical segments of the internal carotid arteries are patent with antegrade flow. Petrous, cavernous, and supraclinoid segments widely patent without stenosis or other abnormality. A1 segments widely patent. Normal anterior communicating artery complex. Anterior cerebral arteries widely patent to their distal aspects. No M1 stenosis or occlusion. Normal MCA bifurcations. Interval revascularization of previously identified left MCA branch occlusion. MCA branches now well perfused and patent bilaterally. POSTERIOR CIRCULATION: Dominant right vertebral artery widely patent to the vertebrobasilar  junction. Left vertebral artery markedly hypoplastic and terminates in PICA. Both PICA patent. Basilar patent to its distal aspect without stenosis. Superior cerebellar arteries patent bilaterally. Both PCAs primarily supplied via the basilar well perfused to their distal aspects. No intracranial aneurysm. IMPRESSION: MRI HEAD IMPRESSION: 1. Acute ischemic left MCA distribution infarct as above. Subcentimeter focus of susceptibility artifact consistent with a small focus of petechial hemorrhage. No frank malignant hemorrhagic transformation or significant mass effect. 2. Otherwise essentially normal brain MRI for age. MRA HEAD IMPRESSION: 1. Interval revascularization of previously identified left MCA branch occlusion, now patent. 2. Otherwise stable and normal intracranial MRA. Electronically Signed   By: Rise Mu M.D.   On: 09/26/2020 23:04   MR BRAIN WO CONTRAST  Result Date: 09/26/2020 CLINICAL DATA:  Follow-up examination for acute stroke, previous MCA branch occlusion, status post tPA and catheter directed revascularization. EXAM: MRI HEAD WITHOUT CONTRAST MRA HEAD WITHOUT CONTRAST TECHNIQUE: Multiplanar, multiecho pulse sequences of the brain and surrounding structures were obtained without intravenous contrast. Angiographic images of the head were obtained using MRA technique without contrast. COMPARISON:  Prior studies from 09/25/2020 FINDINGS: MRI HEAD FINDINGS Brain: Cerebral volume within normal limits for age. Minimal scattered T2/FLAIR hyperintensity noted within the periventricular white matter, most likely related chronic microvascular ischemic  disease, minimal for age. Area of confluent restricted diffusion seen involving the anterior left frontal region with mild involvement of the underlying anterior left insular cortex, consistent with acute left MCA distribution infarct (series 3, image 32). Associated early gyral swelling and edema within the area of infarction without  significant regional mass effect. Subcentimeter focus of susceptibility artifact at the left frontal operculum/external capsule likely reflects a small focus of petechial hemorrhage (series 8, image 13). No frank malignant hemorrhagic transformation. No other evidence for acute or subacute ischemia. Gray-white matter differentiation otherwise maintained. No other areas of chronic cortical infarction. No other evidence for acute or chronic intracranial hemorrhage. No mass lesion, midline shift or mass effect. No hydrocephalus or extra-axial fluid collection. Pituitary gland suprasellar region normal. Midline structures intact. Vascular: Major intracranial vascular flow voids are well maintained. Skull and upper cervical spine: Craniocervical junction within normal limits. Bone marrow signal intensity normal. No focal marrow replacing lesion. No scalp soft tissue abnormality. Sinuses/Orbits: Globes and orbital soft tissues within normal limits. Left maxillary sinus retention cysts noted. Additional mild mucosal thickening elsewhere within the paranasal sinuses. No mastoid effusion. Inner ear structures within normal limits. Other: None. MRA HEAD FINDINGS ANTERIOR CIRCULATION: Visualized distal cervical segments of the internal carotid arteries are patent with antegrade flow. Petrous, cavernous, and supraclinoid segments widely patent without stenosis or other abnormality. A1 segments widely patent. Normal anterior communicating artery complex. Anterior cerebral arteries widely patent to their distal aspects. No M1 stenosis or occlusion. Normal MCA bifurcations. Interval revascularization of previously identified left MCA branch occlusion. MCA branches now well perfused and patent bilaterally. POSTERIOR CIRCULATION: Dominant right vertebral artery widely patent to the vertebrobasilar junction. Left vertebral artery markedly hypoplastic and terminates in PICA. Both PICA patent. Basilar patent to its distal aspect  without stenosis. Superior cerebellar arteries patent bilaterally. Both PCAs primarily supplied via the basilar well perfused to their distal aspects. No intracranial aneurysm. IMPRESSION: MRI HEAD IMPRESSION: 1. Acute ischemic left MCA distribution infarct as above. Subcentimeter focus of susceptibility artifact consistent with a small focus of petechial hemorrhage. No frank malignant hemorrhagic transformation or significant mass effect. 2. Otherwise essentially normal brain MRI for age. MRA HEAD IMPRESSION: 1. Interval revascularization of previously identified left MCA branch occlusion, now patent. 2. Otherwise stable and normal intracranial MRA. Electronically Signed   By: Rise Mu M.D.   On: 09/26/2020 23:04   IR CT Head Ltd  Result Date: 09/27/2020 INDICATION: New onset left gaze deviation, wrist and hand paresthesias and aphasia. Occluded left middle cerebral artery superior division on CT angiogram of the head and neck. EXAM: 1. EMERGENT LARGE VESSEL OCCLUSION THROMBOLYSIS (anterior CIRCULATION) COMPARISON:  CT angiogram of the head and neck of September 25, 2020. MEDICATIONS: Ancef 2 g IV antibiotic was administered within 1 hour of the procedure. ANESTHESIA/SEDATION: General anesthesia CONTRAST:  Isovue 300 approximately 50 mL FLUOROSCOPY TIME:  Fluoroscopy Time: 28 minutes 30 seconds (1,100 mGy). COMPLICATIONS: None immediate. TECHNIQUE: Following a full explanation of the procedure along with the potential associated complications, an informed witnessed consent was obtained. The risks of intracranial hemorrhage of 10%, worsening neurological deficit, ventilator dependency, death and inability to revascularize were all reviewed in detail with the patient's spouse. The patient was then put under general anesthesia by the Department of Anesthesiology at [facility]. The right groin was prepped and draped in the usual sterile fashion. Thereafter using modified Seldinger technique,  transfemoral access into the right common femoral artery was obtained without difficulty. Over  a 0.035 inch guidewire an 8 French 25 cm Pinnacle sheath was inserted. Through this, and also over a 0.035 inch guidewire a 5 French Simmons 2 support catheter inside of an 087 balloon guide catheter was advanced to the aortic arch region and selectively cannulated in the left common carotid artery to the aortic arch region and selectively positioned in the left common carotid artery at the bifurcation. FINDINGS: The left common carotid arteriogram demonstrates the left external carotid artery and its major branches to be widely patent. The left internal carotid artery at the bulb to the cranial skull base is widely patent. The petrous, the cavernous and the supraclinoid segments are widely patent. The left anterior cerebral artery opacifies into the capillary and venous phases. The left middle cerebral artery opacifies into the capillary and venous phases. Demonstrated is occlusion of the superior division of the left middle cerebral artery in its mid M2 segment. A prominent area of hypoperfusion is demonstrated on the delayed arterial and capillary phases. PROCEDURE: Through the balloon guide catheter, a combination of an 055 136 cm Zoom aspiration catheter inside of which was a 35 160 cm Zoom aspiration Trevo ProVue microcatheter was advanced to the M1 segment of the left middle cerebral artery. The micro guidewire was then gently manipulated and advanced through the occluded superior division projecting medially and then laterally followed by the 35 Zoom aspiration catheter which was buried in the proximal occluded segment. The guidewire was removed. There was no aspiration at the hub of the 35 Zoom aspiration catheter. Constant aspiration was then applied with a 20 mL syringe at the hub of the 35 Zoom aspiration catheter, and also at the 55 Zoom aspiration catheter with a Penumbra aspiration device. The Zoom 55 had  been advanced into the mid superior division. After approximately 2 minutes, the combination of the 35 and the 55 Zoom aspiration catheters was retrieved and removed. Proximal flow arrest was reversed. A control arteriogram performed through the balloon guide catheter now in the distal left internal carotid artery demonstrated minimal change in the occluded distal superior division. A second pass was then made again using the above combination. The 35 Zoom aspiration catheter was advanced just proximal to the occlusion. A gentle control arteriogram performed through this demonstrated a large filling defect just distal to this extending into the M3 segment. An Aristotle 014 inch micro guidewire was then introduced through the 35 Zoom aspiration catheter. The guidewire was advanced more distally into the M3 M4 region followed by the 35 Zoom aspiration catheter in the distal M3 segment. Aspiration was then applied with a 20 mL syringe at the 55 Zoom aspiration catheter hub, and also at the hub of the 55 Zoom aspiration catheter which was then advanced more distally with proximal flow arrest. After approximately 2 minutes, the combination of the 35 Zoom and the 55 Zoom aspiration catheter was retrieved and removed. Flow arrest was reversed. Control arteriogram performed through the balloon guide in the distal left ICA demonstrated now near complete revascularization of the superior division of the left middle cerebral artery. There was occlusion in the distal M4 region of this branch with a focal area of hypoperfusion. A TICI 2C revascularization was achieved. Balloon guide was then retrieved into the left common carotid artery. A control arteriogram performed through this demonstrated patency of the left ICA extra cranially and intracranially. The left anterior cerebral artery remained widely patent. The left middle cerebral artery distribution demonstrated no evidence of intraluminal filling defects or  of occlusions  except for the distal M4 occlusion. The balloon guide was removed. An 8 French Angio-Seal closure device was applied for hemostasis in the right groin puncture site. Distal pulses remained Dopplerable in all fours unchanged. A CT scan of the brain demonstrated no evidence of gross hemorrhage or mass effect or midline shift. A less than 1 cm area of contrast blush was noted in the subcortical mid temporal/parietal region. Patient's general anesthesia was then reversed and the patient was extubated. Upon recovery the patient was able to obey simple instructions appropriately. She had minimal weakness in the right upper extremity with a right facial droop. She exhibited no verbal output. She was then transferred to neuro ICU for post revascularization management. IMPRESSION: Status post near complete revascularization of occluded superior division of the left middle cerebral artery and the mid M2 segment with 2 passes with contact aspiration and proximal aspiration achieving a TICI 2C revascularization. PLAN: Follow-up in the clinic approximately 4 weeks post discharge. Electronically Signed   By: Julieanne Cotton M.D.   On: 09/26/2020 10:28   ECHOCARDIOGRAM COMPLETE  Result Date: 09/26/2020    ECHOCARDIOGRAM REPORT   Patient Name:   Marzell L Chumley Date of Exam: 09/26/2020 Medical Rec #:  [id number]      Height:       65.0 in Accession #:    [id number]     Weight:       195.1 lb Date of Birth:  Nov 03, 1970      BSA:          1.957 m Patient Age:    49 years       BP:           110/62 mmHg Patient Gender: F              HR:           74 bpm. Exam Location:  Inpatient Procedure: 2D Echo, Cardiac Doppler and Color Doppler Indications:    Stroke  History:        Patient has prior history of Echocardiogram examinations, most                 recent 06/03/2020. Risk Factors:Former Smoker. H/O PFO closure                 30 years ago.  Sonographer:    Ross Ludwig RDCS (AE) Referring Phys: [id number] ASHISH ARORA IMPRESSIONS  1.  Left ventricular ejection fraction, by estimation, is 60 to 65%. The left ventricle has normal function. The left ventricle has no regional wall motion abnormalities. Left ventricular diastolic parameters were normal.  2. Right ventricular systolic function is normal. The right ventricular size is normal. There is normal pulmonary artery systolic pressure.  3. The atrial septum appears intact, without residual shunt, without a visible closure device (surgical closure?). It tends to bow right to left, but there are no other signs of high right atrial pressure.  4. The mitral valve is normal in structure. No evidence of mitral valve regurgitation. No evidence of mitral stenosis.  5. The aortic valve is normal in structure. Aortic valve regurgitation is not visualized. No aortic stenosis is present.  6. The inferior vena cava is normal in size with greater than 50% respiratory variability, suggesting right atrial pressure of 3 mmHg. Comparison(s): A prior study was performed on 03/13/2020. The small saline contrast shunt seen on TEE was not demonstrated on the current study. FINDINGS  Left Ventricle: Left ventricular ejection  fraction, by estimation, is 60 to 65%. The left ventricle has normal function. The left ventricle has no regional wall motion abnormalities. The left ventricular internal cavity size was normal in size. There is  no left ventricular hypertrophy. Left ventricular diastolic parameters were normal. Indeterminate filling pressures. Right Ventricle: The right ventricular size is normal. No increase in right ventricular wall thickness. Right ventricular systolic function is normal. There is normal pulmonary artery systolic pressure. The tricuspid regurgitant velocity is 2.37 m/s, and  with an assumed right atrial pressure of 8 mmHg, the estimated right ventricular systolic pressure is 30.5 mmHg. Left Atrium: Left atrial size was normal in size. Right Atrium: Right atrial size was normal in size.  Pericardium: There is no evidence of pericardial effusion. Mitral Valve: The mitral valve is normal in structure. No evidence of mitral valve regurgitation. No evidence of mitral valve stenosis. MV peak gradient, 3.1 mmHg. The mean mitral valve gradient is 1.0 mmHg. Tricuspid Valve: The tricuspid valve is normal in structure. Tricuspid valve regurgitation is not demonstrated. No evidence of tricuspid stenosis. Aortic Valve: The aortic valve is normal in structure. Aortic valve regurgitation is not visualized. No aortic stenosis is present. Aortic valve mean gradient measures 5.0 mmHg. Aortic valve peak gradient measures 9.7 mmHg. Aortic valve area, by VTI measures 1.89 cm. Pulmonic Valve: The pulmonic valve was normal in structure. Pulmonic valve regurgitation is not visualized. No evidence of pulmonic stenosis. Aorta: The aortic root is normal in size and structure. Venous: The inferior vena cava is normal in size with greater than 50% respiratory variability, suggesting right atrial pressure of 3 mmHg. IAS/Shunts: No atrial level shunt detected by color flow Doppler.  LEFT VENTRICLE PLAX 2D LVIDd:         4.20 cm  Diastology LVIDs:         3.10 cm  LV e' medial:    5.63 cm/s LV PW:         1.00 cm  LV E/e' medial:  16.2 LV IVS:        1.00 cm  LV e' lateral:   8.25 cm/s LVOT diam:     1.90 cm  LV E/e' lateral: 11.1 LV SV:         61 LV SV Index:   31 LVOT Area:     2.84 cm  RIGHT VENTRICLE             IVC RV Basal diam:  2.60 cm     IVC diam: 1.60 cm RV S prime:     10.10 cm/s TAPSE (M-mode): 2.5 cm LEFT ATRIUM             Index       RIGHT ATRIUM           Index LA diam:        2.60 cm 1.33 cm/m  RA Area:     13.10 cm LA Vol (A2C):   47.0 ml 24.01 ml/m RA Volume:   26.20 ml  13.38 ml/m LA Vol (A4C):   44.3 ml 22.63 ml/m LA Biplane Vol: 48.5 ml 24.78 ml/m  AORTIC VALVE AV Area (Vmax):    2.00 cm AV Area (Vmean):   2.09 cm AV Area (VTI):     1.89 cm AV Vmax:           156.00 cm/s AV Vmean:           95.700 cm/s AV VTI:  0.323 m AV Peak Grad:      9.7 mmHg AV Mean Grad:      5.0 mmHg LVOT Vmax:         110.00 cm/s LVOT Vmean:        70.600 cm/s LVOT VTI:          0.215 m LVOT/AV VTI ratio: 0.67  AORTA Ao Root diam: 3.00 cm Ao Asc diam:  2.80 cm MITRAL VALVE               TRICUSPID VALVE MV Area (PHT): 2.22 cm    TR Peak grad:   22.5 mmHg MV Area VTI:   2.00 cm    TR Vmax:        237.00 cm/s MV Peak grad:  3.1 mmHg MV Mean grad:  1.0 mmHg    SHUNTS MV Vmax:       0.87 m/s    Systemic VTI:  0.22 m MV Vmean:      51.4 cm/s   Systemic Diam: 1.90 cm MV Decel Time: 342 msec MV E velocity: 91.30 cm/s MV A velocity: 74.60 cm/s MV E/A ratio:  1.22 Mihai Croitoru MD Electronically signed by Thurmon Fair MD Signature Date/Time: 09/26/2020/1:53:17 PM    Final    IR PERCUTANEOUS ART THROMBECTOMY/INFUSION INTRACRANIAL INC DIAG ANGIO  Result Date: 09/27/2020 INDICATION: New onset left gaze deviation, wrist and hand paresthesias and aphasia. Occluded left middle cerebral artery superior division on CT angiogram of the head and neck. EXAM: 1. EMERGENT LARGE VESSEL OCCLUSION THROMBOLYSIS (anterior CIRCULATION) COMPARISON:  CT angiogram of the head and neck of September 25, 2020. MEDICATIONS: Ancef 2 g IV antibiotic was administered within 1 hour of the procedure. ANESTHESIA/SEDATION: General anesthesia CONTRAST:  Isovue 300 approximately 50 mL FLUOROSCOPY TIME:  Fluoroscopy Time: 28 minutes 30 seconds (1,100 mGy). COMPLICATIONS: None immediate. TECHNIQUE: Following a full explanation of the procedure along with the potential associated complications, an informed witnessed consent was obtained. The risks of intracranial hemorrhage of 10%, worsening neurological deficit, ventilator dependency, death and inability to revascularize were all reviewed in detail with the patient's spouse. The patient was then put under general anesthesia by the Department of Anesthesiology at [facility]. The right groin was prepped  and draped in the usual sterile fashion. Thereafter using modified Seldinger technique, transfemoral access into the right common femoral artery was obtained without difficulty. Over a 0.035 inch guidewire an 8 French 25 cm Pinnacle sheath was inserted. Through this, and also over a 0.035 inch guidewire a 5 French Simmons 2 support catheter inside of an 087 balloon guide catheter was advanced to the aortic arch region and selectively cannulated in the left common carotid artery to the aortic arch region and selectively positioned in the left common carotid artery at the bifurcation. FINDINGS: The left common carotid arteriogram demonstrates the left external carotid artery and its major branches to be widely patent. The left internal carotid artery at the bulb to the cranial skull base is widely patent. The petrous, the cavernous and the supraclinoid segments are widely patent. The left anterior cerebral artery opacifies into the capillary and venous phases. The left middle cerebral artery opacifies into the capillary and venous phases. Demonstrated is occlusion of the superior division of the left middle cerebral artery in its mid M2 segment. A prominent area of hypoperfusion is demonstrated on the delayed arterial and capillary phases. PROCEDURE: Through the balloon guide catheter, a combination of an 055 136 cm Zoom aspiration catheter inside  of which was a 35 160 cm Zoom aspiration Trevo ProVue microcatheter was advanced to the M1 segment of the left middle cerebral artery. The micro guidewire was then gently manipulated and advanced through the occluded superior division projecting medially and then laterally followed by the 35 Zoom aspiration catheter which was buried in the proximal occluded segment. The guidewire was removed. There was no aspiration at the hub of the 35 Zoom aspiration catheter. Constant aspiration was then applied with a 20 mL syringe at the hub of the 35 Zoom aspiration catheter, and also  at the 55 Zoom aspiration catheter with a Penumbra aspiration device. The Zoom 55 had been advanced into the mid superior division. After approximately 2 minutes, the combination of the 35 and the 55 Zoom aspiration catheters was retrieved and removed. Proximal flow arrest was reversed. A control arteriogram performed through the balloon guide catheter now in the distal left internal carotid artery demonstrated minimal change in the occluded distal superior division. A second pass was then made again using the above combination. The 35 Zoom aspiration catheter was advanced just proximal to the occlusion. A gentle control arteriogram performed through this demonstrated a large filling defect just distal to this extending into the M3 segment. An Aristotle 014 inch micro guidewire was then introduced through the 35 Zoom aspiration catheter. The guidewire was advanced more distally into the M3 M4 region followed by the 35 Zoom aspiration catheter in the distal M3 segment. Aspiration was then applied with a 20 mL syringe at the 55 Zoom aspiration catheter hub, and also at the hub of the 55 Zoom aspiration catheter which was then advanced more distally with proximal flow arrest. After approximately 2 minutes, the combination of the 35 Zoom and the 55 Zoom aspiration catheter was retrieved and removed. Flow arrest was reversed. Control arteriogram performed through the balloon guide in the distal left ICA demonstrated now near complete revascularization of the superior division of the left middle cerebral artery. There was occlusion in the distal M4 region of this branch with a focal area of hypoperfusion. A TICI 2C revascularization was achieved. Balloon guide was then retrieved into the left common carotid artery. A control arteriogram performed through this demonstrated patency of the left ICA extra cranially and intracranially. The left anterior cerebral artery remained widely patent. The left middle cerebral artery  distribution demonstrated no evidence of intraluminal filling defects or of occlusions except for the distal M4 occlusion. The balloon guide was removed. An 8 French Angio-Seal closure device was applied for hemostasis in the right groin puncture site. Distal pulses remained Dopplerable in all fours unchanged. A CT scan of the brain demonstrated no evidence of gross hemorrhage or mass effect or midline shift. A less than 1 cm area of contrast blush was noted in the subcortical mid temporal/parietal region. Patient's general anesthesia was then reversed and the patient was extubated. Upon recovery the patient was able to obey simple instructions appropriately. She had minimal weakness in the right upper extremity with a right facial droop. She exhibited no verbal output. She was then transferred to neuro ICU for post revascularization management. IMPRESSION: Status post near complete revascularization of occluded superior division of the left middle cerebral artery and the mid M2 segment with 2 passes with contact aspiration and proximal aspiration achieving a TICI 2C revascularization. PLAN: Follow-up in the clinic approximately 4 weeks post discharge. Electronically Signed   By: Julieanne Cotton M.D.   On: 09/26/2020 10:28   CT HEAD CODE  STROKE WO CONTRAST  Result Date: 09/25/2020 CLINICAL DATA:  Code stroke. Initial evaluation for acute right-sided facial droop, right arm drift. EXAM: CT HEAD WITHOUT CONTRAST TECHNIQUE: Contiguous axial images were obtained from the base of the skull through the vertex without intravenous contrast. COMPARISON:  Prior MRI from 06/04/2020. FINDINGS: Brain: Cerebral volume within normal limits. No acute intracranial hemorrhage. No acute large vessel territory infarct. No mass lesion, midline shift or mass effect. No hydrocephalus or extra-axial fluid collection. Vascular: No hyperdense vessel. Skull: Scalp soft tissues and calvarium within normal limits. Sinuses/Orbits: Left  gaze noted. Globes and orbital soft tissues otherwise unremarkable. Left maxillary sinus retention cyst. Additional scattered mucosal thickening noted within the ethmoidal air cells and maxillary sinuses. Paranasal sinuses are otherwise clear. No mastoid effusion. Other: None. ASPECTS [facility] Stroke Program Early CT Score) - Ganglionic level infarction (caudate, lentiform nuclei, internal capsule, insula, M1-M3 cortex): 7 - Supraganglionic infarction (M4-M6 cortex): 3 Total score (0-10 with 10 being normal): 10 IMPRESSION: 1. Negative head CT.  No acute intracranial abnormality. 2. ASPECTS is 10. These results were communicated to Dr. Wilford Corner at 9:25 pmon 3/24/2022by text page via the [facility] messaging system. Electronically Signed   By: Rise Mu M.D.   On: 09/25/2020 21:30   HISTORY OF PRESENT ILLNESS Aayana L Bryngelson is a 50 y.o. 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 TPA given. CTA of the neck revealed  left M2/M3 occlusion. She was taken emergently for thrombectomy by Dr. Corliss Skains with left MCA TICI2c. She was admitted to the neurologic ICU postprocedure.   HOSPITAL COURSE Intensive post tPA and thrombectomy monitoring was undertaken in the neurologic ICU. Stroke work up revealed the following diagnostic findings:  CT head no acute abnormality CTA of the neck left M2/M3 occlusion IR left M2 occlusion status post TICI 2c reperfusion MRI left frontal acute infarct MRA Interval revascularization of previously identified left MCA branch occlusion, now patent. 2. Otherwise stable and normal intracranial MRA. 2D Echo EF 60 to 65%, atrial septum no residual shunt Loop recorder interrogation request called to [employer] rep by Keitha Butte PA and is pending.  LDL 23, TG 231 HgbA1c 5.4 TCD with bubble study was requested by patient/husband but then declined later.   Patient  remained hemodynamically and neurologically stable. She was transferred to the floor. Therapy teams were consulted and provided evaluation, treatment and discharge recommendations. 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  ASA 81 mg and Brillinta x 4 weeks, then ASA 81mg  daily alone.  Further discussion pending clinic appt regarding plan. Accardia study enrollment offered.  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 DAPT ->aspirin only 05/20/2020.  CT head and neck negative.  EF 50 to 55%.  TCD bubble study negative for PFO and LE venous Doppler negative for DVT.  Hypercoagulable and autoimmune work-up negative. Initial TEE concerning for possible IA shunt (previously closed ASD).  However, on review from Dr. Excell Seltzer, no significant IA shunt Cardiac CT  11/19 no CAD or PFO or IA shunt loop placed 05/20/2020 06/2020 small right frontal lobe infarct, status post TPA, unclear etiology, consider recurrent cryptogenic stroke.  CT no acute abnormality, MRI small right frontal precentral gyrus infarct.  MRA head and neck no LVO.  EF 60 to 65%.  Loop recorder interrogation no A. fib.  Patient declined LP.  Cerebral angiogram negative for vasculitis.  LDL 64, A1c 5.3.  Discharged with DAPT aspirin 81 and Plavix 75.   History of ASD ASD closure 30 yrs ago at [facility],  small IA shunt seen during TEE 03/2020, but not significant by [facility] [place]  Cardiac CT 05/2020 no IA shunt   Hyperlipidemia Home meds:   lipitor 20, continued  LDL 23, goal < 70   Hashimoto's thyroiditis Diagnosed 17 years ago 03/2020, TSH 6.13 (H), but FT4 normal, Thyroid peroxidase Ab 55 (H), Thyroglobulin Ab - 1.5 (H) 06/2020 TSH normal and free T4 normal   Other Stroke Risk Factors Former Cigarette smoker Mild ETOH use (1/wk), advised to drink no more than 1 drink(s) a day Obesity, Body mass index is 32.47 kg/m., BMI >/= 30 associated with increased stroke risk, recommend weight loss, diet and exercise as appropriate  Hx menstrual migraines   Other Active Problems Elevated TG 231 Episodic headaches at baseline Responded well to usual medication in the hospital   DISCHARGE EXAM Blood pressure (!) 115/47, pulse 73, temperature 97.6 F (36.4 C), temperature source Oral, resp. rate 14, height 5\' 5"  (1.651 m), weight 88.5 kg, SpO2 98 %.  General - Well nourished, well developed, in no apparent distress.   Ophthalmologic - fundi not visualized due to noncooperation.   Cardiovascular - Regular rhythm and rate.   Mental Status -  Awake alert, able to follow simple commands.   Language: Expressive aphasia without spontaneous speech, follows simple commands reliably and appropriately.    Cranial Nerves II - XII - II - Visual field intact OU. III, IV, VI - Extraocular movements intact. V - Facial sensation intact bilaterally. VII - slight right facial nasolabial fold flattening VIII - Hearing & vestibular intact bilaterally. X - Palate elevates symmetrically. XI - Chin turning & shoulder shrug intact bilaterally. XII - Tongue protrusion intact.   Motor Strength - The patient's strength was normal in all extremities and pronator drift was absent.  Bulk was normal and fasciculations were absent.   Motor Tone - Muscle tone was assessed at the neck and appendages and was normal.   Sensory - Light touch, temperature/pinprick were assessed and were symmetrical.     Coordination - The patient had normal movements in  the hands with no ataxia or dysmetria.  Tremor was absent.   Gait and Station - deferred.  Discharge Diet       Diet   Diet regular Room service appropriate? Yes; Fluid consistency: Thin   liquids  DISCHARGE PLAN Disposition:  Home, outpatient PT and ST ASA 81mg  daily with Brilinta 90mg  BID x 30 days then ASA 81mg  as monotherapy. (May enroll in Arcardia trial) Ongoing stroke risk factor control by Primary Care Physician at time of discharge. Follow-up PCP [place], Audrie Gallus, MD in 2 weeks. Follow-up in [facility] in 4 weeks, office to schedule an appointment.   35 minutes were spent preparing discharge.  Delila A Bailey-Modzik, NP-C I have personally obtained history,examined this patient, reviewed notes, independently viewed imaging studies, participated in medical decision making and plan of care.ROS completed by me personally and  pertinent positives fully documented  I have made any additions or clarifications directly to the above note. Agree with note above.  Recommend aspirin and Brilinta for 4 weeks followed by aspirin alone or consider participation in the [place] trial (aspirin versus Eliquis) for stroke prevention  Delia Heady, MD Medical Director [facility] St[facility] Pager: [phone] 09/29/2020 4:50 PM

## 2020-09-29 NOTE — TOC Transition Note (Signed)
Transition of Care [facility]) - CM/SW Discharge Note   Patient Details  Name: [name] MRN: [id number] Date of Birth: October 14, 1970  Transition of Care [facility]) CM/SW Contact:  Kermit Balo, RN Phone Number: 09/29/2020, 4:15 PM   Clinical Narrative:    Patient discharging home with outpatient therapy at [facility]. Information on the AVS. No DME needs.  Pt provided co pay card for Brilinta. Pt has supervision at home and transportation to home.   Final next level of care: OP Rehab Barriers to Discharge: No Barriers Identified   Patient Goals and CMS Choice   CMS Medicare.gov Compare Post Acute Care list provided to:: Patient Choice offered to / list presented to : Patient,Spouse,Adult Children  Discharge Placement                       Discharge Plan and Services   Discharge Planning Services: CM Consult                                 Social Determinants of Health (SDOH) Interventions     Readmission Risk Interventions No flowsheet data found.

## 2020-09-29 NOTE — Progress Notes (Signed)
  Speech Language Pathology Treatment: Dysphagia  Patient Details Name: [name] MRN: [id number] DOB: 1971-03-08 Today's Date: 09/29/2020 Time: 1045-1100 SLP Time Calculation (min) (ACUTE ONLY): 15 min  Assessment / Plan / Recommendation Clinical Impression  Pt participated in trials of thin liquids and regular solids. She demonstrated good awareness and was cautious with self-regulating behaviors while consuming crackers and water.  There were no overt s/s of aspiration. Mastication was improved re: timing and efficiency.  Recommend advancing diet to regular solids, thin liquids.  Neurology entered the room during session, so communication was not addressed this session. Will attempt to return later today to work on speech output if schedule permits.    HPI HPI: 50 yo female presenting with R facial droop, R sided weakness, aphasia, and L gaze. Given IV TPA on 3/24. Noncontrast head CT negative for bleed. S/p L revascularization of occluded L MCA. Intubated briefly for procedure 3/24. PMH including two CVAs (03/2020 and 05/2020-R parietal occipital and L frontal infarcts) and migraines.      SLP Plan  Continue with current plan of care       Recommendations  Diet recommendations: Regular;Thin liquid Liquids provided via: Cup;Straw Medication Administration: Whole meds with liquid Supervision: Patient able to self feed                Oral Care Recommendations: Oral care BID Follow up Recommendations: Outpatient SLP SLP Visit Diagnosis: Dysphagia, unspecified (R13.10) Plan: Continue with current plan of care       GO              [name] L. Samson Frederic, MA CCC/SLP Acute Rehabilitation Services Office number [phone] Pager [phone]   Blenda Mounts [name] 09/29/2020, 11:04 AM

## 2020-09-30 ENCOUNTER — Telehealth: Payer: Self-pay | Admitting: Cardiology

## 2020-09-30 LAB — CUP PACEART REMOTE DEVICE CHECK
Date Time Interrogation Session: 20220329084746
Implantable Pulse Generator Implant Date: 20211116

## 2020-09-30 NOTE — Telephone Encounter (Signed)
Spoke with Dr. Lazarus Salines who is a retired ENT. He states that this patient was seen in the hospital last summer after suffering a stroke. The patient was seen by Dr. Mayford Knife and an echo bubble study was done which showed possible ASD - she had this repaired in highschool. Patient had a workup done by Dr. Excell Seltzer as well and felt that there was no significant shunt or PFO. Patient has since had two more strokes, most recently 09/25/20.  Dr. Lazarus Salines is requesting that Dr. Mayford Knife or Dr. Excell Seltzer take a look at her records and see if further workup from a cardiac standpoint is needed

## 2020-09-30 NOTE — Telephone Encounter (Signed)
Please get patient in to see Dr. Excell Seltzer

## 2020-09-30 NOTE — Telephone Encounter (Signed)
Dr. Lazarus Salines needs to talk to Dr. Mayford Knife regarding the patient. Patient recently had a stroke. Please advice

## 2020-10-02 ENCOUNTER — Ambulatory Visit: Payer: BC Managed Care – PPO | Attending: Neurology | Admitting: Occupational Therapy

## 2020-10-02 ENCOUNTER — Ambulatory Visit: Payer: BC Managed Care – PPO | Admitting: Speech Pathology

## 2020-10-02 ENCOUNTER — Other Ambulatory Visit: Payer: Self-pay

## 2020-10-02 ENCOUNTER — Encounter: Payer: Self-pay | Admitting: Speech Pathology

## 2020-10-02 ENCOUNTER — Encounter: Payer: Self-pay | Admitting: Occupational Therapy

## 2020-10-02 DIAGNOSIS — R41840 Attention and concentration deficit: Secondary | ICD-10-CM | POA: Diagnosis not present

## 2020-10-02 DIAGNOSIS — R41841 Cognitive communication deficit: Secondary | ICD-10-CM | POA: Diagnosis not present

## 2020-10-02 DIAGNOSIS — I69318 Other symptoms and signs involving cognitive functions following cerebral infarction: Secondary | ICD-10-CM

## 2020-10-02 DIAGNOSIS — Z0289 Encounter for other administrative examinations: Secondary | ICD-10-CM

## 2020-10-02 DIAGNOSIS — R4701 Aphasia: Secondary | ICD-10-CM

## 2020-10-02 NOTE — Therapy (Signed)
[facility] [address] [place], [place], [address] Phone: [phone]   Fax:  [phone]  Speech Language Pathology Evaluation  Patient Details  Name: [name] MRN: [id number] Date of Birth: [birth year] Referring Provider (SLP): Beryle Beams MD   Encounter Date: 10/02/2020   End of Session - 10/02/20 1137    Visit Number 1    Number of Visits 17    Date for SLP Re-Evaluation 12/02/20    SLP Start Time 1100    SLP Stop Time  1140    SLP Time Calculation (min) 40 min    Activity Tolerance Patient tolerated treatment well           Past Medical History:  Diagnosis Date  . Hx of migraines   . Stroke [name])    06/03/20    Past Surgical History:  Procedure Laterality Date  . BUBBLE STUDY  03/13/2020   Procedure: BUBBLE STUDY;  Surgeon: Quintella Reichert, MD;  Location: [facility] ENDOSCOPY;  Service: Cardiovascular;;  . ENDOMETRIAL ABLATION     Uterine/heavy menses  . IR ANGIO INTRA EXTRACRAN SEL COM CAROTID INNOMINATE BILAT MOD SED  06/06/2020  . IR ANGIO VERTEBRAL SEL VERTEBRAL BILAT MOD SED  06/06/2020  . IR CT HEAD LTD  09/25/2020  . IR PERCUTANEOUS ART THROMBECTOMY/INFUSION INTRACRANIAL INC DIAG ANGIO  09/25/2020  . IR US GUIDE VASC ACCESS RIGHT  06/06/2020  . RADIOLOGY WITH ANESTHESIA N/A 09/25/2020   Procedure: RADIOLOGY WITH ANESTHESIA;  Surgeon: Radiologist, Medication, MD;  Location: [facility] OR;  Service: Radiology;  Laterality: N/A;  . SHOULDER SURGERY     Left,/bone spurs  . TEE WITHOUT CARDIOVERSION N/A 03/13/2020   Procedure: TRANSESOPHAGEAL ECHOCARDIOGRAM (TEE);  Surgeon: Quintella Reichert, MD;  Location: [facility] ENDOSCOPY;  Service: Cardiovascular;  Laterality: N/A;    There were no vitals filed for this visit.       SLP Evaluation [facility] - 10/02/20 1246      SLP Visit Information   SLP Received On 10/02/20    Referring Provider (SLP) Beryle Beams MD    Onset Date 09/25/20    Medical Diagnosis CVA      Subjective    Subjective Pt unable to answer questions independently.    Patient/Family Stated Goal To speak      General Information   HPI 50 yo female presenting with R facial droop, R sided weakness, aphasia, and L gaze. Given IV TPA on 3/24. Noncontrast head CT negative for bleed. S/p L revascularization of occluded L MCA. Intubated briefly for procedure 3/24. PMH including two CVAs (03/2020 and 05/2020-R parietal occipital and L frontal infarcts) and migraines.      Balance Screen   Has the patient fallen in the past 6 months No    Has the patient had a decrease in activity level because of a fear of falling?  No    Is the patient reluctant to leave their home because of a fear of falling?  No      Prior Functional Status   Cognitive/Linguistic Baseline Within functional limits    Type of Home House     Lives With Spouse    Available Support Family    Vocation Other (Comment)      Auditory Comprehension   Overall Auditory Comprehension Impaired    Yes/No Questions Impaired    Basic Biographical Questions 76-100% accurate    Complex Questions 50-74% accurate    Commands Impaired    One Step  Basic Commands 75-100% accurate    Two Step Basic Commands 50-74% accurate    EffectiveTechniques Repetition;Extra processing time;Visual/Gestural cues      Verbal Expression   Overall Verbal Expression Impaired    Initiation No impairment    Level of Generative/Spontaneous Verbalization Word    Repetition Impaired    Naming Impairment    Responsive 0-25% accurate    Confrontation 0-24% accurate    Verbal Errors Phonemic paraphasias;Semantic paraphasias;Inconsistent;Aware of errors    Effective Techniques Melodic intonation;Sentence completion;Articulatory cues      Written Expression   Dominant Hand Left    Written Expression Exceptions to [facility]      Oral Motor/Sensory Function   Overall Oral Motor/Sensory Function Impaired    Lingual ROM Reduced right    Lingual Strength Reduced Right     Overall Oral Motor/Sensory Function Tongue weakness/ROM      Motor Speech   Overall Motor Speech Impaired    Articulation Impaired    Motor Planning Impaired    Level of Impairment Word    Motor Speech Errors Aware;Groping for words;Inconsistent      Standardized Assessments   Standardized Assessments  Other Assessment   WAB-BEDSIDE     Individuals Consulted   Consulted and Agree with Results and Recommendations Patient;Family member/caregiver    Family Member Consulted  husband            Western Aphasia Battery - Bedside Record Form   Spontaneous Speech - Content: 1/10 Spontaneous Speech- Fluency: 2/10 Auditory Verbal Comprehension- Yes/No Questions: 8/10 Sequential Commands:6 /10  Repetition: 3/10 Object naming: 1/10 Writing: 2/10 Apraxia (Optional): 10  Classification Criteria: Fluency <5: (2) Auditory Verbal Comp >3: (8) Repetition <8: (3)  BROCA'S APHASIA             SLP Education - 10/02/20 1253    Education Details Provided edu on aphasia diagnosis    Person(s) Educated Patient    Methods Demonstration;Explanation    Comprehension Verbalized understanding;Need further instruction            SLP Short Term Goals - 10/02/20 1240      SLP SHORT TERM GOAL #1   Title Pt will produce automatic speech (name, counting, days, months) to increase ease and fluency of expressive communication.    Time 4    Period Weeks    Status New      SLP SHORT TERM GOAL #2   Title Pt will complete a given sentence with a single word without phonemic errors (brush your ____) given a verbal model.    Time 4    Period Weeks    Status New      SLP SHORT TERM GOAL #3   Title Pt will repeat single words without phonemic errors given <2 verbal models.    Time 4    Period Weeks    Status New            SLP Long Term Goals - 10/02/20 1234      SLP LONG TERM GOAL #1   Title Patient will verbalize her family's names independently as reported by patient/family.     Time 8    Period Weeks    Status New      SLP LONG TERM GOAL #2   Title Patient will identify 3 strategies that assist her in word finding.    Time 8    Period Weeks    Status New  Plan - 10/02/20 1226    Clinical Impression Statement Pt is a 50 yo female who presented to the ED with R sided weakness on 09/25/20. During conversation, patient required husband to speak to her history and concerns due to significant expressive deficits. WAB-Bedside and supplemental informal evaluation was completed today. WAB-Bedside revealed dx of Broca's Aphasia; nonfluent, effortful and agrammatic speech, with impaired repetition and relatively intact auditory comprehension. See note for more details. Informal assessment revealed pt benefitted from phonemic cueing and cloze sentences. Automatic speech is impaired, but she is stimuable to complete with less phonemic substitutions noted. SLP rec skilled speech services to address moderate-to-severe aphasia to increase her confidence, participation in ADLs, and be able to voice her needs/wants.    Speech Therapy Frequency 2x / week    Duration 8 weeks    Treatment/Interventions Cognitive reorganization;Multimodal communcation approach;Language facilitation;Compensatory techniques;Internal/external aids;SLP instruction and feedback;Environmental controls;Patient/family education;Functional tasks;Cueing hierarchy    Potential to Achieve Goals Good    Potential Considerations Ability to learn/carryover information    Consulted and Agree with Plan of Care Patient;Family member/caregiver    Family Member Consulted Husband           Patient will benefit from skilled therapeutic intervention in order to improve the following deficits and impairments:   Aphasia    Problem List Patient Active Problem List   Diagnosis Date Noted  . Acute ischemic stroke (HCC) 09/25/2020  . History of ischemic left MCA stroke s/p tPA 06/06/2020  . History of  repair of congenital atrial septal defect (ASD) 06/06/2020  . Hyperlipidemia LDL goal <70 06/06/2020  . Received intravenous tissue plasminogen activator (tPA) in emergency department   . Hypothyroid 12/22/2011  . Stress incontinence 12/22/2011  . Family history of kidney disease 12/22/2011  . Routine gynecological examination 12/22/2011  . Routine general medical examination at a health care facility 12/19/2011    Michelene Gardener Lavina MS, [place], CBIS  10/02/2020, 12:54 PM  [facility] [address] [place], [place], [address] Phone: [phone]   Fax:  [phone]  Name: [name] MRN: [id number] Date of Birth: Dec 05, 1970

## 2020-10-02 NOTE — Therapy (Signed)
[facility] [address] [place], [place], [address] Phone: [phone]   Fax:  [phone]  Occupational Therapy Evaluation  Patient Details  Name: [name] MRN: [id number] Date of Birth: 29-May-1971 Referring Provider (OT): Beryle Beams, MD   Encounter Date: 10/02/2020   OT End of Session - 10/02/20 2045    Visit Number 1    Number of Visits 7    Date for OT Re-Evaluation 11/27/20    Authorization Type BC[employer]    OT Start Time 1015    OT Stop Time 1100    OT Time Calculation (min) 45 min    Activity Tolerance Patient tolerated treatment well    Behavior During Therapy [facility] for tasks assessed/performed           Past Medical History:  Diagnosis Date  . Hx of migraines   . Stroke [facility])    06/03/20    Past Surgical History:  Procedure Laterality Date  . BUBBLE STUDY  03/13/2020   Procedure: BUBBLE STUDY;  Surgeon: Quintella Reichert, MD;  Location: [facility] ENDOSCOPY;  Service: Cardiovascular;;  . ENDOMETRIAL ABLATION     Uterine/heavy menses  . IR ANGIO INTRA EXTRACRAN SEL COM CAROTID INNOMINATE BILAT MOD SED  06/06/2020  . IR ANGIO VERTEBRAL SEL VERTEBRAL BILAT MOD SED  06/06/2020  . IR CT HEAD LTD  09/25/2020  . IR PERCUTANEOUS ART THROMBECTOMY/INFUSION INTRACRANIAL INC DIAG ANGIO  09/25/2020  . IR US GUIDE VASC ACCESS RIGHT  06/06/2020  . RADIOLOGY WITH ANESTHESIA N/A 09/25/2020   Procedure: RADIOLOGY WITH ANESTHESIA;  Surgeon: Radiologist, Medication, MD;  Location: [facility] OR;  Service: Radiology;  Laterality: N/A;  . SHOULDER SURGERY     Left,/bone spurs  . TEE WITHOUT CARDIOVERSION N/A 03/13/2020   Procedure: TRANSESOPHAGEAL ECHOCARDIOGRAM (TEE);  Surgeon: Quintella Reichert, MD;  Location: [facility] ENDOSCOPY;  Service: Cardiovascular;  Laterality: N/A;    There were no vitals filed for this visit.   Subjective Assessment - 10/02/20 1024    Subjective  Subjective primarily provided by pt's husband due to pt's expressive aphasia. Pt  has had 2 prior strokes: one 03/11/20, which affected her R side, a second one 06/03/20, which affected her L side. This most recent CVA occurred 09/25/20, during which she/her family noticed tingling, facial drooping, and R-sided weakness,progressive in nature until EMT arrived    Patient is accompanied by: Family member   Husband [name])   Pertinent History CVA x2 (03/11/20, 06/03/20)    Limitations Expressive aphasia    Patient Stated Goals Get back to being as independent as possible    Currently in Pain? No/denies             [facility] OT Assessment - 10/02/20 1028      Assessment   Medical Diagnosis L MCA CVA    Referring Provider (OT) Beryle Beams, MD    Onset Date/Surgical Date 09/25/20    Hand Dominance Left    Prior Therapy Acute OT/PT/ST      Precautions   Precautions None      Balance Screen   Has the patient fallen in the past 6 months No      Home  Environment   Family/patient expects to be discharged to: Private residence    Type of Home House    Home Layout Multi-level    Lives With Family   Husband [name]), 2 daughters, 3 indoor dogs and 1 outdoor     Prior Function  Level of Independence Independent    Vocation Full time employment    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   Is interested in returning to work, if able     ADL   ADL comments Pt reports no difficulty w/ BADLs at this time      IADL   Prior Level of Function Meal Prep Independent    Meal Prep Able to complete simple cold meal and snack prep;Does not utilize stove or oven    Prior Level of [place] Community Mobility Independent    Community Mobility Relies on family or friends for transportation   Is interested in returning to driving; OT provided education on receiving medical clearance from a physician   Prior Level of Function Medication Managment Independent    Medication Management Takes responsibility if medication is prepared in advance in seperate dosage      Vision -  History   Baseline Vision Wears glasses for distance only    Additional Comments Pt had an appt w/ optometrist yesterday that went well      Vision Assessment   Eye Alignment Within Functional Limits    Ocular Range of Motion Within Functional Limits    Tracking/Visual Pursuits Decreased smoothness of horizontal tracking    Saccades Decreased speed of saccadic movement    Convergence Within functional limits    Visual Fields No apparent deficits    Comment Pt completed Trail Making Part A w/out difficulty, required extra time w/ Part B, and completed number cancellation w/out difficulty; Pt reports no dizziness or diplopia      Cognition   Overall Cognitive Status Cognition to be further assessed in functional context PRN    Cognition Comments Pt reports she has noticed some difficulty w/ attention and memory since onset      Sensation   Light Touch Appears Intact    Hot/Cold Appears Intact      Coordination   Gross Motor Movements are Fluid and Coordinated Yes    Fine Motor Movements are Fluid and Coordinated Yes    9 Hole Peg Test Right;Left    Right 9 Hole Peg Test 22 sec    Left 9 Hole Peg Test 20 sec   Dominant     ROM / Strength   AROM / PROM / Strength AROM;Strength      AROM   Overall AROM  Within functional limits for tasks performed    Overall AROM Comments Bilateral shoulder, elbow, wrist, hand      Strength   Overall Strength Within functional limits for tasks performed    Strength Assessment Site Shoulder;Elbow    Right/Left Shoulder Right;Left    Right Shoulder Flexion 4/5    Right Shoulder Extension 4/5    Right Shoulder ABduction 4/5    Left Shoulder Flexion 4/5    Left Shoulder Extension 4/5    Left Shoulder ABduction 4/5    Right/Left Elbow Right;Left    Right Elbow Flexion 4+/5    Right Elbow Extension 4+/5    Left Elbow Flexion 4+/5    Left Elbow Extension 4+/5      Hand Function   Right Hand Gross Grasp Functional    Left Hand Gross Grasp  Functional            OT Education - 10/02/20 2044    Education Details Education provided on role and purpose of OT, as well as potential intervention and goal areas.    Person(s) Educated Patient;Spouse    Methods  Explanation    Comprehension Verbalized understanding            OT Short Term Goals - 10/02/20 2056      OT SHORT TERM GOAL #1   Title Pt will independently demonstrate understanding of visual exercises and/or compensatory strategies    Baseline No current visual compensatory strategies    Time 3    Period Weeks    Status New    Target Date 10/23/20      OT SHORT TERM GOAL #2   Title Pt will be able to sequence multi-step IADL tasks w/ Min A at least 75% of the time    Baseline Reports difficulty w/ attention and memory    Time 3    Period Weeks    Status New      OT SHORT TERM GOAL #3   Title Pt will improve alternating attention for participation in IADLs as evidenced by decreasing trail making test time    Baseline Part B: 2 min, 3 sec    Time 3    Period Weeks    Status New            OT Long Term Goals - 10/02/20 2118      OT LONG TERM GOAL #1   Title Pt will be able to cook multi-step meal w/ SPV 100% of the time    Baseline Not able to cook since onset    Time 6    Period Weeks    Status New    Target Date 11/13/20      OT LONG TERM GOAL #2   Title Pt will complete medication management w/ SPV at least 80% of the time    Baseline Assist w/ med management    Time 6    Period Weeks    Status New            Plan - 10/02/20 2046    Clinical Impression Statement Pt is a 50 y.o. female who presents to OP OT secondary to L MCA CVA with resultant expressive aphasia. PMHx includes 2 previous recent strokes (Sept and Nov of 2021) with deficits that resolved almost completely as well as hx of cardiac surgery as a teenager. 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 Pt will benefit from skilled occupational therapy services to address vision and visual perception, mild coordination deficits, cognitive, and implementation of compensatory strategies to improve safety and participation with ADLs and IADLs.    OT Occupational Profile and History Detailed Assessment- Review of Records and additional review of physical, cognitive, psychosocial history related to current functional performance    Occupational performance deficits (Please refer to evaluation for details): ADL's;IADL's;Work;Leisure;Social Participation    Body Structure / Function / Physical Skills ADL;Decreased knowledge of use of DME;FMC;Body mechanics;Vision;Endurance;Coordination;IADL    Cognitive Skills Memory;Attention;Sequencing;Problem Solve    Rehab Potential Good    Clinical Decision Making Several treatment options, min-mod task modification necessary    Comorbidities Affecting Occupational Performance: May have comorbidities impacting occupational performance    Modification or Assistance to Complete Evaluation  Min-Moderate modification of tasks or assist with assess necessary to complete eval    OT Frequency 1x / week    OT Duration 6 weeks    OT Treatment/Interventions Self-care/ADL training;Therapeutic exercise;Visual/perceptual remediation/compensation;Coping strategies training;Patient/family education;Neuromuscular education;Therapeutic activities;Energy conservation;DME and/or AE instruction;Manual Therapy;Cognitive remediation/compensation    Plan Review all goals; visual scanning and  strategies    Consulted and Agree with Plan of Care Patient;Family member/caregiver    Family Member Consulted [name] (husband)           Patient will benefit from skilled therapeutic intervention in order to improve the following deficits and impairments:   Body Structure / Function / Physical Skills: ADL,Decreased knowledge of use of DME,FMC,Body  mechanics,Vision,Endurance,Coordination,IADL Cognitive Skills: Memory,Attention,Sequencing,Problem Solve     Visit Diagnosis: Attention and concentration deficit  Other symptoms and signs involving cognitive functions following cerebral infarction  Cognitive communication deficit    Problem List Patient Active Problem List   Diagnosis Date Noted  . Acute ischemic stroke (HCC) 09/25/2020  . History of ischemic left MCA stroke s/p tPA 06/06/2020  . History of repair of congenital atrial septal defect (ASD) 06/06/2020  . Hyperlipidemia LDL goal <70 06/06/2020  . Received intravenous tissue plasminogen activator (tPA) in emergency department   . Hypothyroid 12/22/2011  . Stress incontinence 12/22/2011  . Family history of kidney disease 12/22/2011  . Routine gynecological examination 12/22/2011  . Routine general medical examination at a health care facility 12/19/2011     Rosie Fate, OTR/L, MSOT 10/02/2020, 9:39 PM  [facility] [address] [place], [place], [address] Phone: [phone]   Fax:  [phone]  Name: [name] MRN: [id number] Date of Birth: [birth year]

## 2020-10-06 ENCOUNTER — Ambulatory Visit: Payer: BC Managed Care – PPO | Attending: Neurology | Admitting: Speech Pathology

## 2020-10-06 ENCOUNTER — Encounter: Payer: Self-pay | Admitting: Speech Pathology

## 2020-10-06 ENCOUNTER — Other Ambulatory Visit: Payer: Self-pay

## 2020-10-06 DIAGNOSIS — R41840 Attention and concentration deficit: Secondary | ICD-10-CM | POA: Diagnosis not present

## 2020-10-06 DIAGNOSIS — R4701 Aphasia: Secondary | ICD-10-CM

## 2020-10-06 DIAGNOSIS — R41841 Cognitive communication deficit: Secondary | ICD-10-CM | POA: Insufficient documentation

## 2020-10-06 DIAGNOSIS — I69318 Other symptoms and signs involving cognitive functions following cerebral infarction: Secondary | ICD-10-CM | POA: Diagnosis not present

## 2020-10-06 DIAGNOSIS — I63412 Cerebral infarction due to embolism of left middle cerebral artery: Secondary | ICD-10-CM | POA: Insufficient documentation

## 2020-10-06 NOTE — Therapy (Signed)
[facility] [address] [place], [place], [address] Phone: [phone]   Fax:  [phone]  Speech Language Pathology Treatment  Patient Details  Name: TATISHA CERINO MRN: [id number] Date of Birth: 22-May-1971 Referring Provider (SLP): Beryle Beams MD   Encounter Date: 10/06/2020   End of Session - 10/06/20 0847    Visit Number 2    Number of Visits 17    Date for SLP Re-Evaluation 12/02/20    SLP Start Time 0845    SLP Stop Time  0930    SLP Time Calculation (min) 45 min    Activity Tolerance Patient tolerated treatment well           Past Medical History:  Diagnosis Date  . Hx of migraines   . Stroke [facility])    06/03/20    Past Surgical History:  Procedure Laterality Date  . BUBBLE STUDY  03/13/2020   Procedure: BUBBLE STUDY;  Surgeon: Quintella Reichert, MD;  Location: [facility] ENDOSCOPY;  Service: Cardiovascular;;  . ENDOMETRIAL ABLATION     Uterine/heavy menses  . IR ANGIO INTRA EXTRACRAN SEL COM CAROTID INNOMINATE BILAT MOD SED  06/06/2020  . IR ANGIO VERTEBRAL SEL VERTEBRAL BILAT MOD SED  06/06/2020  . IR CT HEAD LTD  09/25/2020  . IR PERCUTANEOUS ART THROMBECTOMY/INFUSION INTRACRANIAL INC DIAG ANGIO  09/25/2020  . IR US GUIDE VASC ACCESS RIGHT  06/06/2020  . RADIOLOGY WITH ANESTHESIA N/A 09/25/2020   Procedure: RADIOLOGY WITH ANESTHESIA;  Surgeon: Radiologist, Medication, MD;  Location: [facility] OR;  Service: Radiology;  Laterality: N/A;  . SHOULDER SURGERY     Left,/bone spurs  . TEE WITHOUT CARDIOVERSION N/A 03/13/2020   Procedure: TRANSESOPHAGEAL ECHOCARDIOGRAM (TEE);  Surgeon: Quintella Reichert, MD;  Location: [facility] ENDOSCOPY;  Service: Cardiovascular;  Laterality: N/A;    There were no vitals filed for this visit.   Subjective Assessment - 10/06/20 0846    Subjective Speech appears to have improved since last visit.    Currently in Pain? No/denies    Pain Score 0-No pain                 ADULT SLP TREATMENT -  10/06/20 0908      Treatment Provided   Treatment provided Cognitive-Linquistic      Cognitive-Linquistic Treatment   Treatment focused on Aphasia    Skilled Treatment Addressed verbal expression. Automatic speech, repetition (word level - monosyllabic), Naming (body parts, actions, objects). Next session to focus on stimulabilty for fricative /s, f/. 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      Assessment / Recommendations / Plan   Plan Continue with current plan of care      Progression Toward Goals   Progression toward goals Progressing toward goals              SLP Short Term Goals - 10/06/20 0932      SLP SHORT TERM GOAL #1   Title Pt will produce automatic speech (name, counting, days, months) to increase ease and fluency of expressive communication.    Time 4    Period Weeks    Status New      SLP SHORT TERM GOAL #2   Title Pt will complete a given sentence with a single word without phonemic errors (brush your ____) given a verbal model.    Time 4    Period Weeks    Status New      SLP SHORT TERM GOAL #3   Title  Pt will repeat single words without phonemic errors given <2 verbal models.    Time 4    Period Weeks    Status New            SLP Long Term Goals - 10/06/20 0932      SLP LONG TERM GOAL #1   Title Patient will verbalize her family's names independently as reported by patient/family.    Time 8    Period Weeks    Status New      SLP LONG TERM GOAL #2   Title Patient will identify 3 strategies that assist her in word finding.    Time 8    Period Weeks    Status New            Plan - 10/06/20 0932    Clinical Impression Statement Pt is a 50 yo female who presented to the ED with R sided weakness on 09/25/20. During conversation, patient required husband to speak to her history and concerns due to significant expressive deficits. WAB-Bedside and supplemental informal evaluation was completed today. WAB-Bedside revealed dx of Broca's Aphasia; nonfluent,  effortful and agrammatic speech, with impaired repetition and relatively intact auditory comprehension. See note for more details. Informal assessment revealed pt benefitted from phonemic cueing and cloze sentences. Automatic speech is impaired, but she is stimuable to complete with less phonemic substitutions noted. SLP rec skilled speech services to address moderate-to-severe aphasia to increase her confidence, participation in ADLs, and be able to voice her needs/wants.    Speech Therapy Frequency 2x / week    Duration 8 weeks    Treatment/Interventions Cognitive reorganization;Multimodal communcation approach;Language facilitation;Compensatory techniques;Internal/external aids;SLP instruction and feedback;Environmental controls;Patient/family education;Functional tasks;Cueing hierarchy    Potential to Achieve Goals Good    Potential Considerations Ability to learn/carryover information    Consulted and Agree with Plan of Care Patient;Family member/caregiver    Family Member Consulted Husband           Patient will benefit from skilled therapeutic intervention in order to improve the following deficits and impairments:   Aphasia  Cognitive communication deficit    Problem List Patient Active Problem List   Diagnosis Date Noted  . Acute ischemic stroke (HCC) 09/25/2020  . History of ischemic left MCA stroke s/p tPA 06/06/2020  . History of repair of congenital atrial septal defect (ASD) 06/06/2020  . Hyperlipidemia LDL goal <70 06/06/2020  . Received intravenous tissue plasminogen activator (tPA) in emergency department   . Hypothyroid 12/22/2011  . Stress incontinence 12/22/2011  . Family history of kidney disease 12/22/2011  . Routine gynecological examination 12/22/2011  . Routine general medical examination at a health care facility 12/19/2011    Michelene Gardener [place] MS, [place], CBIS  10/06/2020, 10:57 AM  [facility] [address] [place], [place], [address] Phone: [phone]   Fax:  [phone]   Name: LEVINA BOYACK MRN: [id number] Date of Birth: 05/30/1971

## 2020-10-07 ENCOUNTER — Telehealth: Payer: Self-pay | Admitting: Neurology

## 2020-10-07 NOTE — Progress Notes (Signed)
Carelink Summary Report / Loop Recorder 

## 2020-10-07 NOTE — Telephone Encounter (Signed)
Patients Husband came in to pick up disability paperwork he dropped off last week. The paperwork is still with Dr. Pearlean Brownie so once complete please call and confirm pickup. Thank you

## 2020-10-08 ENCOUNTER — Ambulatory Visit: Payer: BC Managed Care – PPO | Admitting: Occupational Therapy

## 2020-10-08 ENCOUNTER — Encounter: Payer: Self-pay | Admitting: Occupational Therapy

## 2020-10-08 ENCOUNTER — Ambulatory Visit: Payer: BC Managed Care – PPO | Admitting: Speech Pathology

## 2020-10-08 ENCOUNTER — Encounter: Payer: Self-pay | Admitting: Speech Pathology

## 2020-10-08 ENCOUNTER — Other Ambulatory Visit: Payer: Self-pay

## 2020-10-08 ENCOUNTER — Ambulatory Visit: Payer: BC Managed Care – PPO | Admitting: Physical Therapy

## 2020-10-08 DIAGNOSIS — R4701 Aphasia: Secondary | ICD-10-CM | POA: Diagnosis not present

## 2020-10-08 DIAGNOSIS — I69318 Other symptoms and signs involving cognitive functions following cerebral infarction: Secondary | ICD-10-CM | POA: Diagnosis not present

## 2020-10-08 DIAGNOSIS — R41840 Attention and concentration deficit: Secondary | ICD-10-CM | POA: Diagnosis not present

## 2020-10-08 DIAGNOSIS — I63412 Cerebral infarction due to embolism of left middle cerebral artery: Secondary | ICD-10-CM

## 2020-10-08 DIAGNOSIS — R41841 Cognitive communication deficit: Secondary | ICD-10-CM

## 2020-10-08 NOTE — Therapy (Signed)
[facility] [place] [address] [place], [place], [address] Phone: [phone]   Fax:  [phone]  Speech Language Pathology Treatment  Patient Details  Name: [name] MRN: [id number] Date of Birth: 03/20/1971 Referring Provider (SLP): Beryle Beams MD   Encounter Date: 10/08/2020   End of Session - 10/08/20 0857    Visit Number 3    Number of Visits 17    Date for SLP Re-Evaluation 12/02/20    SLP Start Time 0800    SLP Stop Time  0844    SLP Time Calculation (min) 44 min    Activity Tolerance Patient tolerated treatment well           Past Medical History:  Diagnosis Date  . Hx of migraines   . Stroke [facility])    06/03/20    Past Surgical History:  Procedure Laterality Date  . BUBBLE STUDY  03/13/2020   Procedure: BUBBLE STUDY;  Surgeon: Quintella Reichert, MD;  Location: [facility] ENDOSCOPY;  Service: Cardiovascular;;  . ENDOMETRIAL ABLATION     Uterine/heavy menses  . IR ANGIO INTRA EXTRACRAN SEL COM CAROTID INNOMINATE BILAT MOD SED  06/06/2020  . IR ANGIO VERTEBRAL SEL VERTEBRAL BILAT MOD SED  06/06/2020  . IR CT HEAD LTD  09/25/2020  . IR PERCUTANEOUS ART THROMBECTOMY/INFUSION INTRACRANIAL INC DIAG ANGIO  09/25/2020  . IR US GUIDE VASC ACCESS RIGHT  06/06/2020  . RADIOLOGY WITH ANESTHESIA N/A 09/25/2020   Procedure: RADIOLOGY WITH ANESTHESIA;  Surgeon: Radiologist, Medication, MD;  Location: [facility] OR;  Service: Radiology;  Laterality: N/A;  . SHOULDER SURGERY     Left,/bone spurs  . TEE WITHOUT CARDIOVERSION N/A 03/13/2020   Procedure: TRANSESOPHAGEAL ECHOCARDIOGRAM (TEE);  Surgeon: Quintella Reichert, MD;  Location: [facility] ENDOSCOPY;  Service: Cardiovascular;  Laterality: N/A;    There were no vitals filed for this visit.   Subjective Assessment - 10/08/20 0843    Subjective Patient brought mom along today. Reports she is attempting to speak one word at a time.    Patient is accompained by: Family member    Currently in Pain?  No/denies    Pain Score 0-No pain                 ADULT SLP TREATMENT - 10/08/20 0844      Treatment Provided   Treatment provided Cognitive-Linquistic      Cognitive-Linquistic Treatment   Treatment focused on Apraxia;Aphasia    Skilled Treatment Addressed verbal expression by picture naming. Trained and shaped motor movements/articulation for speech to begin the correct sound using the articulatory-kinematic approach. Difficulty with fricatives and inconsistent /r/ production (vocalic and prevocalic).      Assessment / Recommendations / Plan   Plan Continue with current plan of care      Progression Toward Goals   Progression toward goals Progressing toward goals            SLP Education - 10/08/20 0856    Education Details Articulation strategies    Person(s) Educated Patient;Parent(s)    Methods Explanation;Demonstration;Tactile cues;Verbal cues    Comprehension Verbalized understanding;Returned demonstration;Need further instruction;Verbal cues required            SLP Short Term Goals - 10/08/20 0857      SLP SHORT TERM GOAL #1   Title Pt will produce automatic speech (name, counting, days, months) to increase ease and fluency of expressive communication.    Time 3    Period Weeks  Status On-going      SLP SHORT TERM GOAL #2   Title Pt will complete a given sentence with a single word without phonemic errors (brush your ____) given a verbal model.    Time 3    Period Weeks    Status On-going      SLP SHORT TERM GOAL #3   Title Pt will repeat single words without phonemic errors given <2 verbal models.    Time 3    Period Weeks    Status On-going            SLP Long Term Goals - 10/08/20 1093      SLP LONG TERM GOAL #1   Title Patient will verbalize her family's names independently as reported by patient/family.    Time 7    Period Weeks    Status On-going      SLP LONG TERM GOAL #2   Title Patient will identify 3 strategies that assist  her in word finding.    Time 7    Period Weeks    Status On-going            Plan - 10/08/20 0857    Clinical Impression Statement Pt is a 50 yo female who presented to the ED with R sided weakness on 09/25/20. During conversation, patient required husband to speak to her history and concerns due to significant expressive deficits. WAB-Bedside and supplemental informal evaluation was completed today. WAB-Bedside revealed dx of Broca's Aphasia; nonfluent, effortful and agrammatic speech, with impaired repetition and relatively intact auditory comprehension. See note for more details. Informal assessment revealed pt benefitted from phonemic cueing and cloze sentences. Automatic speech is impaired, but she is stimuable to complete with less phonemic substitutions noted. SLP rec skilled speech services to address moderate-to-severe aphasia to increase her confidence, participation in ADLs, and be able to voice her needs/wants.    Speech Therapy Frequency 2x / week    Duration 8 weeks    Treatment/Interventions Cognitive reorganization;Multimodal communcation approach;Language facilitation;Compensatory techniques;Internal/external aids;SLP instruction and feedback;Environmental controls;Patient/family education;Functional tasks;Cueing hierarchy    Potential to Achieve Goals Good    Potential Considerations Ability to learn/carryover information    Consulted and Agree with Plan of Care Patient;Family member/caregiver    Family Member Consulted Husband           Patient will benefit from skilled therapeutic intervention in order to improve the following deficits and impairments:   Aphasia    Problem List Patient Active Problem List   Diagnosis Date Noted  . Acute ischemic stroke (HCC) 09/25/2020  . History of ischemic left MCA stroke s/p tPA 06/06/2020  . History of repair of congenital atrial septal defect (ASD) 06/06/2020  . Hyperlipidemia LDL goal <70 06/06/2020  . Received intravenous  tissue plasminogen activator (tPA) in emergency department   . Hypothyroid 12/22/2011  . Stress incontinence 12/22/2011  . Family history of kidney disease 12/22/2011  . Routine gynecological examination 12/22/2011  . Routine general medical examination at a health care facility 12/19/2011    [name] [place] MS, [place], CBIS  10/08/2020, 8:58 AM  [facility] [place] [facility] [address] [place], [place], [address] Phone: [phone]   Fax:  [phone]   Name: [name] MRN: [id number] Date of Birth: 30-Mar-1971

## 2020-10-08 NOTE — Therapy (Signed)
[facility] [place] [facility] [address] [place], [place], [address] Phone: [phone]   Fax:  [phone]  Physical Therapy Evaluation  Patient Details  Name: [name] MRN: [id number] Date of Birth: December 07, 1970 No data recorded  Encounter Date: 10/08/2020   PT End of Session - 10/08/20 0920    Visit Number 1    Number of Visits 1    Date for PT Re-Evaluation 10/08/20    PT Start Time 0845    PT Stop Time 0910    PT Time Calculation (min) 25 min    Activity Tolerance Patient tolerated treatment well    Behavior During Therapy [facility] for tasks assessed/performed           Past Medical History:  Diagnosis Date  . Hx of migraines   . Stroke [facility])    06/03/20    Past Surgical History:  Procedure Laterality Date  . BUBBLE STUDY  03/13/2020   Procedure: BUBBLE STUDY;  Surgeon: Quintella Reichert, MD;  Location: [facility] ENDOSCOPY;  Service: Cardiovascular;;  . ENDOMETRIAL ABLATION     Uterine/heavy menses  . IR ANGIO INTRA EXTRACRAN SEL COM CAROTID INNOMINATE BILAT MOD SED  06/06/2020  . IR ANGIO VERTEBRAL SEL VERTEBRAL BILAT MOD SED  06/06/2020  . IR CT HEAD LTD  09/25/2020  . IR PERCUTANEOUS ART THROMBECTOMY/INFUSION INTRACRANIAL INC DIAG ANGIO  09/25/2020  . IR US GUIDE VASC ACCESS RIGHT  06/06/2020  . RADIOLOGY WITH ANESTHESIA N/A 09/25/2020   Procedure: RADIOLOGY WITH ANESTHESIA;  Surgeon: Radiologist, Medication, MD;  Location: [facility] OR;  Service: Radiology;  Laterality: N/A;  . SHOULDER SURGERY     Left,/bone spurs  . TEE WITHOUT CARDIOVERSION N/A 03/13/2020   Procedure: TRANSESOPHAGEAL ECHOCARDIOGRAM (TEE);  Surgeon: Quintella Reichert, MD;  Location[facility] ENDOSCOPY;  Service: Cardiovascular;  Laterality: N/A;    There were no vitals filed for this visit.    Subjective Assessment - 10/08/20 0848    Subjective Pt has a history of CVAs and had an CVA on 09/25/20 and was hosptialized and had to have surgery. Pt had minor residual effects from past  CVAs but this CVA has affected her language and cognition primarily.    Patient is accompained by: Family member    Patient Stated Goals improve language and return towards prior level of function    Currently in Pain? No/denies              [facility] PT Assessment - 10/08/20 0001      Assessment   Medical Diagnosis Lt MCA CVA    Onset Date/Surgical Date 09/25/20    Prior Therapy acute PT      Precautions   Precautions None      Balance Screen   Has the patient fallen in the past 6 months No      Prior Function   Level of Independence Independent    Vocation Full time employment      Strength   Overall Strength Within functional limits for tasks performed      Standardized Balance Assessment   Standardized Balance Assessment Berg Balance Test      Berg Balance Test   Sit to Stand Able to stand without using hands and stabilize independently    Standing Unsupported Able to stand safely 2 minutes    Sitting with Back Unsupported but Feet Supported on Floor or Stool Able to sit safely and securely 2 minutes    Stand to Sit Sits safely  with minimal use of hands    Transfers Able to transfer safely, minor use of hands    Standing Unsupported with Eyes Closed Able to stand 10 seconds safely    Standing Unsupported with Feet Together Able to place feet together independently and stand 1 minute safely    From Standing, Reach Forward with Outstretched Arm Can reach forward >12 cm safely (5")    From Standing Position, Pick up Object from Floor Able to pick up shoe safely and easily    From Standing Position, Turn to Look Behind Over each Shoulder Looks behind from both sides and weight shifts well    Turn 360 Degrees Able to turn 360 degrees safely in 4 seconds or less    Standing Unsupported, Alternately Place Feet on Step/Stool Able to stand independently and safely and complete 8 steps in 20 seconds    Standing Unsupported, One Foot in Front Able to place foot tandem independently  and hold 30 seconds    Standing on One Leg Able to lift leg independently and hold > 10 seconds    Total Score 55                      Objective measurements completed on examination: See above findings.               PT Education - 10/08/20 0919    Education Details PT provided education on importance of daily physical activity and safety during exercise    Person(s) Educated Patient;Parent(s)    Methods Explanation    Comprehension Verbalized understanding                       Plan - 10/08/20 0920    Clinical Impression Statement Pt presents with BERG balance score of 55/56 and appears at prior level of function for all gait and mobility. Pt educated on importance of daily physical activity. Pt with no further PT needs at this time    Stability/Clinical Decision Making Stable/Uncomplicated    Clinical Decision Making Low    Rehab Potential Good    PT Frequency One time visit    PT Treatment/Interventions Patient/family education    PT Next Visit Plan Eval only    Consulted and Agree with Plan of Care Patient;Family member/caregiver    Family Member Consulted mother           Patient will benefit from skilled therapeutic intervention in order to improve the following deficits and impairments:     Visit Diagnosis: Cerebrovascular accident (CVA) due to embolism of left middle cerebral artery (HCC) - Plan: PT plan of care cert/re-cert     Problem List Patient Active Problem List   Diagnosis Date Noted  . Acute ischemic stroke (HCC) 09/25/2020  . History of ischemic left MCA stroke s/p tPA 06/06/2020  . History of repair of congenital atrial septal defect (ASD) 06/06/2020  . Hyperlipidemia LDL goal <70 06/06/2020  . Received intravenous tissue plasminogen activator (tPA) in emergency department   . Hypothyroid 12/22/2011  . Stress incontinence 12/22/2011  . Family history of kidney disease 12/22/2011  . Routine gynecological  examination 12/22/2011  . Routine general medical examination at a health care facility 12/19/2011   Reggy Eye, PT  [facility] 10/08/2020, 9:25 AM  [facility] [address] [place], [place], [address] Phone: [phone]   Fax:  [phone]  Name: [name] MRN: [id number] Date of Birth: December 10, 1970

## 2020-10-09 ENCOUNTER — Telehealth: Payer: Self-pay | Admitting: *Deleted

## 2020-10-09 NOTE — Therapy (Signed)
[facility] [place] [address]. [place], [place], [address] Phone: [phone]   Fax:  [phone]  Occupational Therapy Treatment  Patient Details  Name: [name] MRN: [id number] Date of Birth: [birth year] Referring Provider (OT): Beryle Beams, MD   Encounter Date: 10/08/2020   OT End of Session - 10/08/20 0912    Visit Number 2    Number of Visits 7    Date for OT Re-Evaluation 11/27/20    Authorization Type BC[employer]    OT Start Time 0910    OT Stop Time 0955    OT Time Calculation (min) 45 min    Activity Tolerance Patient tolerated treatment well    Behavior During Therapy [facility] for tasks assessed/performed           Past Medical History:  Diagnosis Date  . Hx of migraines   . Stroke [facility])    06/03/20    Past Surgical History:  Procedure Laterality Date  . BUBBLE STUDY  03/13/2020   Procedure: BUBBLE STUDY;  Surgeon: Quintella Reichert, MD;  Location: [facility] ENDOSCOPY;  Service: Cardiovascular;;  . ENDOMETRIAL ABLATION     Uterine/heavy menses  . IR ANGIO INTRA EXTRACRAN SEL COM CAROTID INNOMINATE BILAT MOD SED  06/06/2020  . IR ANGIO VERTEBRAL SEL VERTEBRAL BILAT MOD SED  06/06/2020  . IR CT HEAD LTD  09/25/2020  . IR PERCUTANEOUS ART THROMBECTOMY/INFUSION INTRACRANIAL INC DIAG ANGIO  09/25/2020  . IR US GUIDE VASC ACCESS RIGHT  06/06/2020  . RADIOLOGY WITH ANESTHESIA N/A 09/25/2020   Procedure: RADIOLOGY WITH ANESTHESIA;  Surgeon: Radiologist, Medication, MD;  Location: [facility] OR;  Service: Radiology;  Laterality: N/A;  . SHOULDER SURGERY     Left,/bone spurs  . TEE WITHOUT CARDIOVERSION N/A 03/13/2020   Procedure: TRANSESOPHAGEAL ECHOCARDIOGRAM (TEE);  Surgeon: Quintella Reichert, MD;  Location: [facility] ENDOSCOPY;  Service: Cardiovascular;  Laterality: N/A;    There were no vitals filed for this visit.   Subjective Assessment - 10/08/20 0911    Subjective  Pt's mother reports pt and her family believe they have noticed some improvement  in pt's word-finding since previous session    Patient is accompanied by: Family member   Mother [name])   Pertinent History CVA x2 (03/11/20, 06/03/20)    Limitations Expressive aphasia    Patient Stated Goals Get back to being as independent as possible    Currently in Pain? No/denies            OT Treatments/Exercises (OP) - 10/08/20 0957      ADLs   Compensatory Strategies OT provided compensatory strategies for communication deficit including creating simple visual communication board for common requests/communications used at home and incorporating picture-based visuals (when able/appropriate) to improve sequencing of IADL/home maintenance tasks. OT also provided education to pt's mother involving pt's family providing options/binary choice to compensate for communication deficit at home          OT Treatment: 4-step sequencing activities used to facilitate organization of thought, processing, reasoning, and sequencing of functional activities - Sequencing w/ written steps: pt required extra time to complete successfully, Min A to correct error x1, and reported that it was difficult for her to process the words - Sequencing w/ picture cards: pt able to complete w/out difficulty and within typical amount of time - Sequencing w/ verbal steps: pt demo'd difficulty w/ recalling tasks for sequencing, requiring repetition, extended processing time, and downgrading to sequence steps and correct error x1  OT  graded written sequencing activity by providing breakdown of task, binary choice, gestural cues, repetition and extended time with positive results     OT Education - 10/08/20 0911    Education Details Reviewed all goals w/ pt and provided education on compensatory strategies to trial when attempting to request items/help at home    Person(s) Educated Patient    Methods Explanation    Comprehension Verbalized understanding            OT Short Term Goals - 10/08/20 0914       OT SHORT TERM GOAL #1   Title Pt will independently demonstrate understanding of visual exercises and/or compensatory strategies    Baseline No current visual compensatory strategies    Time 3    Period Weeks    Status On-going    Target Date 10/23/20      OT SHORT TERM GOAL #2   Title Pt will be able to sequence multi-step IADL tasks w/ Min A at least 75% of the time    Baseline Reports difficulty w/ attention and memory    Time 3    Period Weeks    Status On-going      OT SHORT TERM GOAL #3   Title Pt will improve alternating attention for participation in IADLs as evidenced by decreasing trail making test time    Baseline Part B: 2 min, 3 sec    Time 3    Period Weeks    Status On-going            OT Long Term Goals - 10/08/20 0914      OT LONG TERM GOAL #1   Title Pt will be able to cook multi-step meal w/ SPV 100% of the time    Baseline Not able to cook since onset    Time 6    Period Weeks    Status On-going      OT LONG TERM GOAL #2   Title Pt will complete medication management w/ SPV at least 80% of the time    Baseline Assist w/ med management    Time 6    Period Weeks    Status On-going            Plan - 10/08/20 9163    Clinical Impression Statement Pt completed various sequencing activities (written, pictoral, and verbal) and demonstrated most success w/ pictoral sequencing and structured written sequencing. OT then provided recommendations of compensatory strategies and specific examples of caregiver cueing to improve ease of communication at home based on response to varied methods of sequencing.    OT Occupational Profile and History Detailed Assessment- Review of Records and additional review of physical, cognitive, psychosocial history related to current functional performance    Occupational performance deficits (Please refer to evaluation for details): ADL's;IADL's;Work;Leisure;Social Participation    Body Structure / Function / Physical Skills  ADL;Decreased knowledge of use of DME;FMC;Body mechanics;Vision;Endurance;Coordination;IADL    Cognitive Skills Memory;Attention;Sequencing;Problem Solve    Rehab Potential Good    Clinical Decision Making Several treatment options, min-mod task modification necessary    Comorbidities Affecting Occupational Performance: May have comorbidities impacting occupational performance    Modification or Assistance to Complete Evaluation  Min-Moderate modification of tasks or assist with assess necessary to complete eval    OT Frequency 1x / week    OT Duration 6 weeks    OT Treatment/Interventions Self-care/ADL training;Therapeutic exercise;Visual/perceptual remediation/compensation;Coping strategies training;Patient/family education;Neuromuscular education;Therapeutic activities;Energy conservation;DME and/or AE instruction;Manual Therapy;Cognitive remediation/compensation  Plan Visual scanning and strategies    Consulted and Agree with Plan of Care Patient;Family member/caregiver    Family Member Consulted [name] (husband)           Patient will benefit from skilled therapeutic intervention in order to improve the following deficits and impairments:   Body Structure / Function / Physical Skills: ADL,Decreased knowledge of use of DME,FMC,Body mechanics,Vision,Endurance,Coordination,IADL Cognitive Skills: Memory,Attention,Sequencing,Problem Solve     Visit Diagnosis: Cognitive communication deficit  Attention and concentration deficit  Other symptoms and signs involving cognitive functions following cerebral infarction    Problem List Patient Active Problem List   Diagnosis Date Noted  . Acute ischemic stroke (HCC) 09/25/2020  . History of ischemic left MCA stroke s/p tPA 06/06/2020  . History of repair of congenital atrial septal defect (ASD) 06/06/2020  . Hyperlipidemia LDL goal <70 06/06/2020  . Received intravenous tissue plasminogen activator (tPA) in emergency department   .  Hypothyroid 12/22/2011  . Stress incontinence 12/22/2011  . Family history of kidney disease 12/22/2011  . Routine gynecological examination 12/22/2011  . Routine general medical examination at a health care facility 12/19/2011     Rosie Fate, OTR/L, MSOT 10/08/2020, 10:00 AM  [facility] [address] [place], [place], [address] Phone: [phone]   Fax:  [phone]  Name: ARRIONA PREST MRN: [id number] Date of Birth: 02-24-1971

## 2020-10-09 NOTE — Telephone Encounter (Signed)
Pt disability form @ the front desk for p/u

## 2020-10-15 ENCOUNTER — Encounter: Payer: Self-pay | Admitting: Speech Pathology

## 2020-10-15 ENCOUNTER — Ambulatory Visit: Payer: BC Managed Care – PPO | Admitting: Occupational Therapy

## 2020-10-15 ENCOUNTER — Ambulatory Visit: Payer: BC Managed Care – PPO | Admitting: Speech Pathology

## 2020-10-15 ENCOUNTER — Other Ambulatory Visit: Payer: Self-pay

## 2020-10-15 DIAGNOSIS — I63412 Cerebral infarction due to embolism of left middle cerebral artery: Secondary | ICD-10-CM | POA: Diagnosis not present

## 2020-10-15 DIAGNOSIS — R4701 Aphasia: Secondary | ICD-10-CM

## 2020-10-15 DIAGNOSIS — I69318 Other symptoms and signs involving cognitive functions following cerebral infarction: Secondary | ICD-10-CM | POA: Diagnosis not present

## 2020-10-15 DIAGNOSIS — R41841 Cognitive communication deficit: Secondary | ICD-10-CM

## 2020-10-15 DIAGNOSIS — R41840 Attention and concentration deficit: Secondary | ICD-10-CM

## 2020-10-15 NOTE — Therapy (Signed)
[facility] [address] [place], [place], [address] Phone: [phone]   Fax:  [phone]  Speech Language Pathology Treatment  Patient Details  Name: [name] MRN: [id number] Date of Birth: 06/24/1971 Referring Provider (SLP): Beryle Beams MD   Encounter Date: 10/15/2020   End of Session - 10/15/20 1032    Visit Number 4    Number of Visits 17    Date for SLP Re-Evaluation 12/02/20    SLP Start Time 0952    SLP Stop Time  1034    SLP Time Calculation (min) 42 min    Activity Tolerance Patient tolerated treatment well           Past Medical History:  Diagnosis Date  . Hx of migraines   . Stroke [facility])    06/03/20    Past Surgical History:  Procedure Laterality Date  . BUBBLE STUDY  03/13/2020   Procedure: BUBBLE STUDY;  Surgeon: Quintella Reichert, MD;  Location: [facility] ENDOSCOPY;  Service: Cardiovascular;;  . ENDOMETRIAL ABLATION     Uterine/heavy menses  . IR ANGIO INTRA EXTRACRAN SEL COM CAROTID INNOMINATE BILAT MOD SED  06/06/2020  . IR ANGIO VERTEBRAL SEL VERTEBRAL BILAT MOD SED  06/06/2020  . IR CT HEAD LTD  09/25/2020  . IR PERCUTANEOUS ART THROMBECTOMY/INFUSION INTRACRANIAL INC DIAG ANGIO  09/25/2020  . IR US GUIDE VASC ACCESS RIGHT  06/06/2020  . RADIOLOGY WITH ANESTHESIA N/A 09/25/2020   Procedure: RADIOLOGY WITH ANESTHESIA;  Surgeon: Radiologist, Medication, MD;  Location: [facility] OR;  Service: Radiology;  Laterality: N/A;  . SHOULDER SURGERY     Left,/bone spurs  . TEE WITHOUT CARDIOVERSION N/A 03/13/2020   Procedure: TRANSESOPHAGEAL ECHOCARDIOGRAM (TEE);  Surgeon: Quintella Reichert, MD;  Location: [facility] ENDOSCOPY;  Service: Cardiovascular;  Laterality: N/A;    There were no vitals filed for this visit.   Subjective Assessment - 10/15/20 0953    Subjective "Doing okay." Reports speech is worse when tired.    Currently in Pain? 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 Trained and shaped motor movements/articulation for speech to begin the correct sound using the articulatory-kinematic approach. Difficulty with fricatives and inconsistent /r/ production (vocalic and prevocalic). /d/ was successful in eliciting correct /r/ prouction when in a blend (/dr/).      Assessment / Recommendations / Plan   Plan Continue with current plan of care      Progression Toward Goals   Progression toward goals Progressing toward goals              SLP Short Term Goals - 10/15/20 1033      SLP SHORT TERM GOAL #1   Title Pt will produce automatic speech (name, counting, days, months) to increase ease and fluency of expressive communication.    Time 3    Period Weeks    Status On-going      SLP SHORT TERM GOAL #2   Title Pt will complete a given sentence with a single word without phonemic errors (brush your ____) given a verbal model.    Time 3  Period Weeks    Status On-going      SLP SHORT TERM GOAL #3   Title Pt will repeat single words without phonemic errors given <2 verbal models.    Time 3    Period Weeks    Status On-going            SLP Long Term Goals - 10/15/20 1033      SLP LONG TERM GOAL #1   Title Patient will verbalize her family's names independently as reported by patient/family.    Time 7    Period Weeks    Status On-going      SLP LONG TERM GOAL #2   Title Patient will identify 3 strategies that assist her in word finding.    Time 7    Period Weeks    Status On-going            Plan - 10/15/20 1033    Clinical Impression Statement Pt is a 50 yo female who presented to the ED with R sided weakness on 09/25/20. During conversation, patient required  husband to speak to her history and concerns due to significant expressive deficits. WAB-Bedside and supplemental informal evaluation was completed today. WAB-Bedside revealed dx of Broca's Aphasia; nonfluent, effortful and agrammatic speech, with impaired repetition and relatively intact auditory comprehension. See note for more details. Informal assessment revealed pt benefitted from phonemic cueing and cloze sentences. Automatic speech is impaired, but she is stimuable to complete with less phonemic substitutions noted. SLP rec skilled speech services to address moderate-to-severe aphasia to increase her confidence, participation in ADLs, and be able to voice her needs/wants.    Speech Therapy Frequency 2x / week    Duration 8 weeks    Treatment/Interventions Cognitive reorganization;Multimodal communcation approach;Language facilitation;Compensatory techniques;Internal/external aids;SLP instruction and feedback;Environmental controls;Patient/family education;Functional tasks;Cueing hierarchy    Potential to Achieve Goals Good    Potential Considerations Ability to learn/carryover information    Consulted and Agree with Plan of Care Patient;Family member/caregiver    Family Member Consulted Husband           Patient will benefit from skilled therapeutic intervention in order to improve the following deficits and impairments:   Aphasia    Problem List Patient Active Problem List   Diagnosis Date Noted  . Acute ischemic stroke (HCC) 09/25/2020  . History of ischemic left MCA stroke s/p tPA 06/06/2020  . History of repair of congenital atrial septal defect (ASD) 06/06/2020  . Hyperlipidemia LDL goal <70 06/06/2020  . Received intravenous tissue plasminogen activator (tPA) in emergency department   . Hypothyroid 12/22/2011  . Stress incontinence 12/22/2011  . Family history of kidney disease 12/22/2011  . Routine gynecological examination 12/22/2011  . Routine general medical examination  at a health care facility 12/19/2011    Michelene Gardener [place] MS, [place], CBIS  10/15/2020, 10:35 AM  [facility] [address] [place], [place], [address] Phone: [phone]   Fax:  [phone]   Name: [name] MRN: [id number] Date of Birth: [birth year]

## 2020-10-16 ENCOUNTER — Ambulatory Visit: Payer: BC Managed Care – PPO | Admitting: Speech Pathology

## 2020-10-16 ENCOUNTER — Encounter: Payer: Self-pay | Admitting: Speech Pathology

## 2020-10-16 DIAGNOSIS — I69318 Other symptoms and signs involving cognitive functions following cerebral infarction: Secondary | ICD-10-CM | POA: Diagnosis not present

## 2020-10-16 DIAGNOSIS — R4701 Aphasia: Secondary | ICD-10-CM | POA: Diagnosis not present

## 2020-10-16 DIAGNOSIS — I63412 Cerebral infarction due to embolism of left middle cerebral artery: Secondary | ICD-10-CM | POA: Diagnosis not present

## 2020-10-16 DIAGNOSIS — R41840 Attention and concentration deficit: Secondary | ICD-10-CM | POA: Diagnosis not present

## 2020-10-16 DIAGNOSIS — R41841 Cognitive communication deficit: Secondary | ICD-10-CM | POA: Diagnosis not present

## 2020-10-16 NOTE — Therapy (Signed)
[facility] [address] [place], [place], [address] Phone: [phone]   Fax:  [phone]  Speech Language Pathology Treatment  Patient Details  Name: [name] MRN: [id number] Date of Birth: 17-Apr-1971 Referring Provider (SLP): Beryle Beams MD   Encounter Date: 10/16/2020   End of Session - 10/16/20 1318    Visit Number 5    Number of Visits 17    Date for SLP Re-Evaluation 12/02/20    SLP Start Time 1018    SLP Stop Time  1103    SLP Time Calculation (min) 45 min    Activity Tolerance Patient tolerated treatment well           Past Medical History:  Diagnosis Date  . Hx of migraines   . Stroke [facility])    06/03/20    Past Surgical History:  Procedure Laterality Date  . BUBBLE STUDY  03/13/2020   Procedure: BUBBLE STUDY;  Surgeon: Quintella Reichert, MD;  Location: [facility] ENDOSCOPY;  Service: Cardiovascular;;  . ENDOMETRIAL ABLATION     Uterine/heavy menses  . IR ANGIO INTRA EXTRACRAN SEL COM CAROTID INNOMINATE BILAT MOD SED  06/06/2020  . IR ANGIO VERTEBRAL SEL VERTEBRAL BILAT MOD SED  06/06/2020  . IR CT HEAD LTD  09/25/2020  . IR PERCUTANEOUS ART THROMBECTOMY/INFUSION INTRACRANIAL INC DIAG ANGIO  09/25/2020  . IR US GUIDE VASC ACCESS RIGHT  06/06/2020  . RADIOLOGY WITH ANESTHESIA N/A 09/25/2020   Procedure: RADIOLOGY WITH ANESTHESIA;  Surgeon: Radiologist, Medication, MD;  Location: [facility] OR;  Service: Radiology;  Laterality: N/A;  . SHOULDER SURGERY     Left,/bone spurs  . TEE WITHOUT CARDIOVERSION N/A 03/13/2020   Procedure: TRANSESOPHAGEAL ECHOCARDIOGRAM (TEE);  Surgeon: Quintella Reichert, MD;  Location: [facility] ENDOSCOPY;  Service: Cardiovascular;  Laterality: N/A;    There were no vitals filed for this visit.   Subjective Assessment - 10/16/20 1021    Subjective Pt was able to speak in full sentence upon entering the therapy room.    Currently in Pain? No/denies                 ADULT SLP TREATMENT - 10/16/20  1027      General Information   Behavior/Cognition Alert;Cooperative;Pleasant mood      Treatment Provided   Treatment provided Cognitive-Linquistic;Dysphagia      Dysphagia Treatment   Treatment Methods Skilled observation;Patient/caregiver education    Patient observed directly with PO's Yes    Type of PO's observed Thin liquids;Regular    Feeding Able to feed self    Liquids provided via Straw;Cup    Oral Phase Signs & Symptoms Other (comment)   NA   Pharyngeal Phase Signs & Symptoms Other (comment)   NA   Other treatment/comments Pt reported occasional trouble with swallowing food at home (x1-2/week) with a throat clear/cough of something that feels like it is going down the wrong way. SLP provided patient with strategies to assist in maintaining increased attention during meal time and follow up with a liquid rinse to clear suspected residue due to limited tongue base retraction. SLP does not believe pt requires MBS at this time due to passing the yale Swallow Protocol and eating peanut butter rcrackers without any overt s/sx of aspiration.  Will continue to monitor. Have asked patient to record foods that she feels she has trouble with.      Cognitive-Linquistic Treatment   Treatment focused on Aphasia    Skilled Treatment Addressed verbal  expression by picture naming. Trained and shaped motor movements/articulation for speech to begin the correct sound using the articulatory-kinematic approach. Pt is beginning to self-cue with /f/ sounds to look in the mirror for articulatory placement for correct production independently. Continues to require verbal models when repeating two syllable words; 70% accuracy indep; 100% with additional model. Benefits from visual cue.      Assessment / Recommendations / Plan   Plan Continue with current plan of care      Progression Toward Goals   Progression toward goals Progressing toward goals              SLP Short Term Goals - 10/16/20 1318       SLP SHORT TERM GOAL #1   Title Pt will produce automatic speech (name, counting, days, months) to increase ease and fluency of expressive communication.    Time 2    Period Weeks    Status On-going      SLP SHORT TERM GOAL #2   Title Pt will complete a given sentence with a single word without phonemic errors (brush your ____) given a verbal model.    Time 2    Period Weeks    Status On-going      SLP SHORT TERM GOAL #3   Title Pt will repeat single words without phonemic errors given <2 verbal models.    Time 2    Period Weeks    Status On-going            SLP Long Term Goals - 10/16/20 1319      SLP LONG TERM GOAL #1   Title Patient will verbalize her family's names independently as reported by patient/family.    Time 6    Period Weeks    Status On-going      SLP LONG TERM GOAL #2   Title Patient will identify 3 strategies that assist her in word finding.    Time 6    Period Weeks    Status On-going            Plan - 10/16/20 1318    Clinical Impression Statement Pt is a 50 yo female who presented to the ED with R sided weakness on 09/25/20. During conversation, patient required husband to speak to her history and concerns due to significant expressive deficits. WAB-Bedside and supplemental informal evaluation was completed today. WAB-Bedside revealed dx of Broca's Aphasia; nonfluent, effortful and agrammatic speech, with impaired repetition and relatively intact auditory comprehension. See note for more details. Informal assessment revealed pt benefitted from phonemic cueing and cloze sentences. Automatic speech is impaired, but she is stimuable to complete with less phonemic substitutions noted. SLP rec skilled speech services to address moderate-to-severe aphasia to increase her confidence, participation in ADLs, and be able to voice her needs/wants.    Speech Therapy Frequency 2x / week    Duration 8 weeks    Treatment/Interventions Cognitive  reorganization;Multimodal communcation approach;Language facilitation;Compensatory techniques;Internal/external aids;SLP instruction and feedback;Environmental controls;Patient/family education;Functional tasks;Cueing hierarchy    Potential to Achieve Goals Good    Potential Considerations Ability to learn/carryover information    Consulted and Agree with Plan of Care Patient;Family member/caregiver    Family Member Consulted Husband           Patient will benefit from skilled therapeutic intervention in order to improve the following deficits and impairments:   Aphasia    Problem List Patient Active Problem List   Diagnosis Date Noted  . Acute ischemic stroke (  HCC) 09/25/2020  . History of ischemic left MCA stroke s/p tPA 06/06/2020  . History of repair of congenital atrial septal defect (ASD) 06/06/2020  . Hyperlipidemia LDL goal <70 06/06/2020  . Received intravenous tissue plasminogen activator (tPA) in emergency department   . Hypothyroid 12/22/2011  . Stress incontinence 12/22/2011  . Family history of kidney disease 12/22/2011  . Routine gynecological examination 12/22/2011  . Routine general medical examination at a health care facility 12/19/2011    Michelene Gardener [employer] MS, [place], CBIS  10/16/2020, 1:19 PM  [facility] [address] [place], [place], [address] Phone: [phone]   Fax:  [phone]   Name: [name] MRN: [id number] Date of Birth: [birth year]

## 2020-10-16 NOTE — Patient Instructions (Signed)
See homework provided on paper.  Website: [email or web address]   Aphasia Therapy: How to Practice at Home   WORD LEVEL . Go around the rooms in your home and name each object you see.  . Go outside and name what you see. [place] Kitchen Open drawers, cabinets, closets and name the items you see. . Look around and name items that begin with a certain letter. . Look around and name items that are a certain color.   PHRASE/SENTENCE LEVEL . Use a carrier phrase as you name items around your house/apartment, such as: "This is my _____", "I have _____", "Where is my _____".  . Describe what you are doing during your daily routine or chores.  o Bathroom: Brushing teeth, washing face o Dressing: Items of clothing you want to wear o Kitchen: Name the item you are putting away from the sink or dishwasher. Say the steps for making food.  o Laundry: Name the item you are folding/putting away.  . Look through pictures and describe who, what, where, when, and why for each picture.   READING . Reading out loud is a good way to practice your speech. You can read: . Newspapers, magazines, recipes, letters, bills, text messages/emails   WRITING . Write the names of items around your house, to-do lists, grocery lists, medication logs, or daily activity logs. . Write down your family member's names and organize it into a family tree . Write down common personal phrases you usually say.  . Write a summary of a news story or short video you watch online or on TV.   *Created by [employer] (2019)

## 2020-10-16 NOTE — Therapy (Signed)
[place]. [place], [place], [address] Phone: [phone]   Fax:  [phone]  Occupational Therapy Treatment & D/C Summary  Patient Details  Name: [name] MRN: [id number] Date of Birth: 23-Feb-1971 Referring Provider (OT): Phillips Odor, MD   Encounter Date: 10/15/2020    OT End of Session - 10/15/2020 Visit Number 3  Number of Visits 7  Date for OT Re-Evaluation 11/27/2020  Authorization Type [employer]  OT Start Time 0930  OT Stop Time 0951  OT Time Calculation (min) 21 min  Activity Tolerance Patient tolerated treatment well  Behavior During Therapy [facility] for tasks assessed/performed     Past Medical History:  Diagnosis Date  . Hx of migraines   . Stroke [facility])    06/03/20    Past Surgical History:  Procedure Laterality Date  . BUBBLE STUDY  03/13/2020   Procedure: BUBBLE STUDY;  Surgeon: Sueanne Margarita, MD;  Location: [facility] ENDOSCOPY;  Service: Cardiovascular;;  . ENDOMETRIAL ABLATION     Uterine/heavy menses  . IR ANGIO INTRA EXTRACRAN SEL COM CAROTID INNOMINATE BILAT MOD SED  06/06/2020  . IR ANGIO VERTEBRAL SEL VERTEBRAL BILAT MOD SED  06/06/2020  . IR CT HEAD LTD  09/25/2020  . IR PERCUTANEOUS ART THROMBECTOMY/INFUSION INTRACRANIAL INC DIAG ANGIO  09/25/2020  . IR US GUIDE VASC ACCESS RIGHT  06/06/2020  . RADIOLOGY WITH ANESTHESIA N/A 09/25/2020   Procedure: RADIOLOGY WITH ANESTHESIA;  Surgeon: Radiologist, Medication, MD;  Location: [facility];  Service: Radiology;  Laterality: N/A;  . SHOULDER SURGERY     Left,/bone spurs  . TEE WITHOUT CARDIOVERSION N/A 03/13/2020   Procedure: TRANSESOPHAGEAL ECHOCARDIOGRAM (TEE);  Surgeon: Sueanne Margarita, MD;  Location: [facility] ENDOSCOPY;  Service: Cardiovascular;  Laterality: N/A;    There were no vitals filed for this visit.   Subjective Assessment - 10/15/20 1030    Subjective  Pt states she is not having difficulty functionally w/ daily activities at home and is  completed almost all activities w/ SPV or independently    Patient is accompanied by: Family member   mother-in-law   Pertinent History CVA x2 (03/11/20, 06/03/20)    Limitations Expressive aphasia    Patient Stated Goals Get back to being as independent as possible    Currently in Pain? No/denies             OT Treatments/Exercises (OP) - 10/15/20 1000      ADLs   Home Maintenance Completed 10-step sequencing of laundry activity (picture cards) w/ SPV             OT Short Term Goals - 10/15/20 0941      OT SHORT TERM GOAL #1   Title Pt will independently demonstrate understanding of visual exercises and/or compensatory strategies    Baseline No current visual compensatory strategies    Time 3    Period Weeks    Status Achieved   10/15/20 - reports no dizziness and carryover of completing visual perceptual activities at home   Target Date 10/23/20      OT SHORT TERM GOAL #2   Title Pt will be able to sequence multi-step IADL tasks w/ Min A at least 75% of the time    Baseline Reports difficulty w/ attention and memory    Time 3    Period Weeks    Status Achieved   10/15/20 - able to sequence IADL task w/ SPV 100% of the time  OT SHORT TERM GOAL #3   Title Pt will improve alternating attention for participation in IADLs as evidenced by decreasing trail making test time    Baseline Part B: 2 min, 3 sec    Time 3    Period Weeks    Status Achieved   10/15/20 - 1.57 sec to complete Trail Making Test: Part B            OT Long Term Goals - 10/15/20 0947      OT LONG TERM GOAL #1   Title Pt will be able to cook multi-step meal w/ SPV 100% of the time    Baseline Not able to cook since onset    Time 6    Period Weeks    Status Achieved   10/15/20 - reports participating in cooking/meal prep at home w/ SPV for safety     OT LONG TERM GOAL #2   Title Pt will complete medication management w/ SPV at least 80% of the time    Baseline Assist w/ med management    Time 6     Period Weeks    Status Deferred   10/15/20 - pt satisfied w/ current level of assist to complete med mgmt            Plan - 10/15/20 1000    Clinical Impression Statement Pt is a 50 y.o. female seen in OP OT 2/2 L MCA CVA with resultant expressive aphasia. Tx has incorporated introduction of compensatory strategies and sequencing of functional tasks, with pt showing functional improvement in daily activities since evaluation. Pt reports she is satisfied with recent progress in participation of functional activities and receives excellent support at home; pt feels she is appropriate for d/c from skilled OT at this time. Educated on continuation of compensatory and visual perceptual activities at home as well as on importance of implementation of safety strategies prn. Due to improvements and continuation of SLP services, skilled occupational therapy services are no longer warranted at this time; pt is agreeable and was encouraged to call back with any changes/development of functional activity limitations.    OT Occupational Profile and History Detailed Assessment- Review of Records and additional review of physical, cognitive, psychosocial history related to current functional performance    Occupational performance deficits (Please refer to evaluation for details): ADL's;IADL's;Work;Leisure;Social Participation    Body Structure / Function / Physical Skills ADL;Decreased knowledge of use of DME;FMC;Body mechanics;Vision;Endurance;Coordination;IADL    Cognitive Skills Memory;Attention;Sequencing;Problem Solve    Rehab Potential Good    Clinical Decision Making Several treatment options, min-mod task modification necessary    Comorbidities Affecting Occupational Performance: May have comorbidities impacting occupational performance    Modification or Assistance to Complete Evaluation  Min-Moderate modification of tasks or assist with assess necessary to complete eval    OT Frequency 1x / week     OT Duration 6 weeks    OT Treatment/Interventions Self-care/ADL training;Therapeutic exercise;Visual/perceptual remediation/compensation;Coping strategies training;Patient/family education;Neuromuscular education;Therapeutic activities;Energy conservation;DME and/or AE instruction;Manual Therapy;Cognitive remediation/compensation    Plan D/C    Consulted and Agree with Plan of Care Patient;Family member/caregiver    Family Member Consulted Chrissie Noa (husband)           Patient will benefit from skilled therapeutic intervention in order to improve the following deficits and impairments:   Body Structure / Function / Physical Skills: ADL,Decreased knowledge of use of DME,FMC,Body mechanics,Vision,Endurance,Coordination,IADL Cognitive Skills: Memory,Attention,Sequencing,Problem Solve     OCCUPATIONAL THERAPY DISCHARGE SUMMARY  Visits from Start of  Care: 3  Current functional level related to goals / functional outcomes: Pt is able to sequence tasks w/ SPV, complete BADLs w/ Mod I, and participate in most IADLs (e.g. cooking, med management) w/ SPV for safety   Remaining deficits: Cognitive communication deficit; some difficulty processing written/verbal information   Education / Equipment: Compensatory strategies for communication deficit, safety strategies, and visual perceptual activities  Plan: Patient agrees to discharge.  Patient goals were partially met. Patient is being discharged due to meeting the stated rehab goals.   See 'Clinical Impression Statement' above for more detail????      Visit Diagnosis: Cognitive communication deficit  Attention and concentration deficit  Other symptoms and signs involving cognitive functions following cerebral infarction    Problem List Patient Active Problem List   Diagnosis Date Noted  . Acute ischemic stroke ([place]) 09/25/2020  . History of ischemic left MCA stroke s/p tPA 06/06/2020  . 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 Hyperlipidemia LDL goal <70 06/06/2020  . Received intravenous tissue plasminogen activator (tPA) in emergency department   . Hypothyroid 12/22/2011  . Stress incontinence 12/22/2011  . Family history of kidney disease 12/22/2011  . Routine gynecological examination 12/22/2011  . Routine general medical examination at a health care facility 12/19/2011     Kathrine Cords, OTR/L, [place] 10/15/2020, 10:00 AM  [place]. [place], [place], [address] Phone: [phone]   Fax:  [phone]  Name: [name] MRN: [id number] Date of Birth: 02-09-1971

## 2020-10-20 ENCOUNTER — Ambulatory Visit: Payer: BC Managed Care – PPO | Admitting: Speech Pathology

## 2020-10-20 ENCOUNTER — Other Ambulatory Visit: Payer: Self-pay

## 2020-10-20 ENCOUNTER — Encounter: Payer: Self-pay | Admitting: Speech Pathology

## 2020-10-20 DIAGNOSIS — R41841 Cognitive communication deficit: Secondary | ICD-10-CM | POA: Diagnosis not present

## 2020-10-20 DIAGNOSIS — R41840 Attention and concentration deficit: Secondary | ICD-10-CM | POA: Diagnosis not present

## 2020-10-20 DIAGNOSIS — R4701 Aphasia: Secondary | ICD-10-CM | POA: Diagnosis not present

## 2020-10-20 DIAGNOSIS — I69318 Other symptoms and signs involving cognitive functions following cerebral infarction: Secondary | ICD-10-CM | POA: Diagnosis not present

## 2020-10-20 DIAGNOSIS — I63412 Cerebral infarction due to embolism of left middle cerebral artery: Secondary | ICD-10-CM | POA: Diagnosis not present

## 2020-10-20 NOTE — Therapy (Signed)
[facility] [address] [place], [place], [address] Phone: [phone]   Fax:  [phone]  Speech Language Pathology Treatment  Patient Details  Name: [name] MRN: [id number] Date of Birth: December 07, 1970 Referring Provider (SLP): Beryle Beams MD   Encounter Date: 10/20/2020   End of Session - 10/20/20 1153    Visit Number 6    Number of Visits 17    Date for SLP Re-Evaluation 12/02/20    SLP Start Time 1100    SLP Stop Time  1153    SLP Time Calculation (min) 53 min    Activity Tolerance Patient tolerated treatment well           Past Medical History:  Diagnosis Date  . Hx of migraines   . Stroke [facility])    06/03/20    Past Surgical History:  Procedure Laterality Date  . BUBBLE STUDY  03/13/2020   Procedure: BUBBLE STUDY;  Surgeon: Quintella Reichert, MD;  Location: [facility] ENDOSCOPY;  Service: Cardiovascular;;  . ENDOMETRIAL ABLATION     Uterine/heavy menses  . IR ANGIO INTRA EXTRACRAN SEL COM CAROTID INNOMINATE BILAT MOD SED  06/06/2020  . IR ANGIO VERTEBRAL SEL VERTEBRAL BILAT MOD SED  06/06/2020  . IR CT HEAD LTD  09/25/2020  . IR PERCUTANEOUS ART THROMBECTOMY/INFUSION INTRACRANIAL INC DIAG ANGIO  09/25/2020  . IR US GUIDE VASC ACCESS RIGHT  06/06/2020  . RADIOLOGY WITH ANESTHESIA N/A 09/25/2020   Procedure: RADIOLOGY WITH ANESTHESIA;  Surgeon: Radiologist, Medication, MD;  Location: [facility] OR;  Service: Radiology;  Laterality: N/A;  . SHOULDER SURGERY     Left,/bone spurs  . TEE WITHOUT CARDIOVERSION N/A 03/13/2020   Procedure: TRANSESOPHAGEAL ECHOCARDIOGRAM (TEE);  Surgeon: Quintella Reichert, MD;  Location: [facility] ENDOSCOPY;  Service: Cardiovascular;  Laterality: N/A;    There were no vitals filed for this visit.          ADULT SLP TREATMENT - 10/20/20 1143      General Information   Behavior/Cognition Alert;Cooperative;Pleasant mood      Treatment Provided   Treatment provided Cognitive-Linquistic       Cognitive-Linquistic Treatment   Treatment focused on Aphasia    Skilled Treatment Addressed verbal expression by picture naming. Trained and shaped motor movements/articulation for speech to begin the correct sound using the articulatory-kinematic approach. Trained patient in [place] to begin addressing completing sentences (subject, verb, object).      Assessment / Recommendations / Plan   Plan Continue with current plan of care      Progression Toward Goals   Progression toward goals Progressing toward goals              SLP Short Term Goals - 10/20/20 1153      SLP SHORT TERM GOAL #1   Title Pt will produce automatic speech (name, counting, days, months) to increase ease and fluency of expressive communication.    Time 2    Period Weeks    Status On-going      SLP SHORT TERM GOAL #2   Title Pt will complete a given sentence with a single word without phonemic errors (brush your ____) given a verbal model.    Time 2    Period Weeks    Status On-going      SLP SHORT TERM GOAL #3   Title Pt will repeat single words without phonemic errors given <2 verbal models.    Time 2    Period Weeks  Status On-going            SLP Long Term Goals - 10/20/20 1153      SLP LONG TERM GOAL #1   Title Patient will verbalize her family's names independently as reported by patient/family.    Time 6    Period Weeks    Status On-going      SLP LONG TERM GOAL #2   Title Patient will identify 3 strategies that assist her in word finding.    Time 6    Period Weeks    Status On-going            Plan - 10/20/20 1153    Clinical Impression Statement Pt is a 50 yo female who presented to the ED with R sided weakness on 09/25/20. During conversation, patient required husband to speak to her history and concerns due to significant expressive deficits. WAB-Bedside and supplemental informal evaluation was completed today. WAB-Bedside revealed dx of Broca's Aphasia; nonfluent, effortful and  agrammatic speech, with impaired repetition and relatively intact auditory comprehension. See note for more details. Informal assessment revealed pt benefitted from phonemic cueing and cloze sentences. Automatic speech is impaired, but she is stimuable to complete with less phonemic substitutions noted. SLP rec skilled speech services to address moderate-to-severe aphasia to increase her confidence, participation in ADLs, and be able to voice her needs/wants.    Speech Therapy Frequency 2x / week    Duration 8 weeks    Treatment/Interventions Cognitive reorganization;Multimodal communcation approach;Language facilitation;Compensatory techniques;Internal/external aids;SLP instruction and feedback;Environmental controls;Patient/family education;Functional tasks;Cueing hierarchy    Potential to Achieve Goals Good    Potential Considerations Ability to learn/carryover information    Consulted and Agree with Plan of Care Patient;Family member/caregiver    Family Member Consulted Husband           Patient will benefit from skilled therapeutic intervention in order to improve the following deficits and impairments:   Aphasia    Problem List Patient Active Problem List   Diagnosis Date Noted  . Acute ischemic stroke ([facility]) 09/25/2020  . History of ischemic left MCA stroke s/p tPA 06/06/2020  . History of repair of congenital atrial septal defect (ASD) 06/06/2020  . Hyperlipidemia LDL goal <70 06/06/2020  . Received intravenous tissue plasminogen activator (tPA) in emergency department   . Hypothyroid 12/22/2011  . Stress incontinence 12/22/2011  . Family history of kidney disease 12/22/2011  . Routine gynecological examination 12/22/2011  . Routine general medical examination at a health care facility 12/19/2011    Michelene Gardener [place] MS, [place], CBIS  10/20/2020, 12:00 PM  [facility] [address] [place], [place], [address] Phone:  [phone]   Fax:  [phone]   Name: ZYLIAH SCHIER MRN: [id number] Date of Birth: [birth year]

## 2020-10-21 NOTE — Telephone Encounter (Signed)
Dr. Lazarus Salines calling back to follow up. He states he has not heard anything back. He states he can get a call back from Dr. Mayford Knife.

## 2020-10-21 NOTE — Telephone Encounter (Signed)
Updated Dr. Lazarus Salines that we would reach back out to Dr. Excell Seltzer and his RN. Advising that they are out of the office until Monday. Verbalized understanding and thanked me for the call.

## 2020-10-23 ENCOUNTER — Ambulatory Visit: Payer: BC Managed Care – PPO | Admitting: Speech Pathology

## 2020-10-23 ENCOUNTER — Encounter: Payer: Self-pay | Admitting: Speech Pathology

## 2020-10-23 ENCOUNTER — Other Ambulatory Visit: Payer: Self-pay

## 2020-10-23 DIAGNOSIS — R41841 Cognitive communication deficit: Secondary | ICD-10-CM | POA: Diagnosis not present

## 2020-10-23 DIAGNOSIS — I69318 Other symptoms and signs involving cognitive functions following cerebral infarction: Secondary | ICD-10-CM | POA: Diagnosis not present

## 2020-10-23 DIAGNOSIS — R4701 Aphasia: Secondary | ICD-10-CM

## 2020-10-23 DIAGNOSIS — I63412 Cerebral infarction due to embolism of left middle cerebral artery: Secondary | ICD-10-CM | POA: Diagnosis not present

## 2020-10-23 DIAGNOSIS — R41840 Attention and concentration deficit: Secondary | ICD-10-CM | POA: Diagnosis not present

## 2020-10-23 NOTE — Therapy (Signed)
[facility] [address] [place], [place], [address] Phone: [phone]   Fax:  [phone]  Speech Language Pathology Treatment  Patient Details  Name: [name] MRN: [id number] Date of Birth: 02-10-1971 Referring Provider (SLP): Beryle Beams MD   Encounter Date: 10/23/2020   End of Session - 10/23/20 0936    Visit Number 7    Number of Visits 17    Date for SLP Re-Evaluation 12/02/20    SLP Start Time 0932    SLP Stop Time  1015    SLP Time Calculation (min) 43 min    Activity Tolerance Patient tolerated treatment well           Past Medical History:  Diagnosis Date  . Hx of migraines   . Stroke [facility])    06/03/20    Past Surgical History:  Procedure Laterality Date  . BUBBLE STUDY  03/13/2020   Procedure: BUBBLE STUDY;  Surgeon: Quintella Reichert, MD;  Location: [facility] ENDOSCOPY;  Service: Cardiovascular;;  . ENDOMETRIAL ABLATION     Uterine/heavy menses  . IR ANGIO INTRA EXTRACRAN SEL COM CAROTID INNOMINATE BILAT MOD SED  06/06/2020  . IR ANGIO VERTEBRAL SEL VERTEBRAL BILAT MOD SED  06/06/2020  . IR CT HEAD LTD  09/25/2020  . IR PERCUTANEOUS ART THROMBECTOMY/INFUSION INTRACRANIAL INC DIAG ANGIO  09/25/2020  . IR US GUIDE VASC ACCESS RIGHT  06/06/2020  . RADIOLOGY WITH ANESTHESIA N/A 09/25/2020   Procedure: RADIOLOGY WITH ANESTHESIA;  Surgeon: Radiologist, Medication, MD;  Location: [facility] OR;  Service: Radiology;  Laterality: N/A;  . SHOULDER SURGERY     Left,/bone spurs  . TEE WITHOUT CARDIOVERSION N/A 03/13/2020   Procedure: TRANSESOPHAGEAL ECHOCARDIOGRAM (TEE);  Surgeon: Quintella Reichert, MD;  Location: [facility] ENDOSCOPY;  Service: Cardiovascular;  Laterality: N/A;    There were no vitals filed for this visit.   Subjective Assessment - 10/23/20 0935    Subjective "I came to yesterday thinking that was when my appointment was."    Patient is accompained by: Family member    Currently in Pain? No/denies                  ADULT SLP TREATMENT - 10/23/20 1014      General Information   Behavior/Cognition Alert;Cooperative;Pleasant mood      Treatment Provided   Treatment provided Cognitive-Linquistic      Cognitive-Linquistic Treatment   Treatment focused on Aphasia    Skilled Treatment Addressed verbal, written, and reading expression this session after patient reported she would like work more on writing and reading in addition to verbal expression. SLP incorporiated writing and reading single words with confrontation naming task. Pt became emotional due to difficulty with the task. SLP scaffolded task to work on each modality independently from one another (i.e. naming task vs. writing task vs. reading task) - pt was more successful.      Assessment / Recommendations / Plan   Plan Continue with current plan of care      Progression Toward Goals   Progression toward goals Progressing toward goals              SLP Short Term Goals - 10/23/20 0936      SLP SHORT TERM GOAL #1   Title Pt will produce automatic speech (name, counting, days, months) to increase ease and fluency of expressive communication.    Time 2    Period Weeks    Status On-going  SLP SHORT TERM GOAL #2   Title Pt will complete a given sentence with a single word without phonemic errors (brush your ____) given a verbal model.    Time 2    Period Weeks    Status On-going      SLP SHORT TERM GOAL #3   Title Pt will repeat single words without phonemic errors given <2 verbal models.    Time 2    Period Weeks    Status On-going            SLP Long Term Goals - 10/23/20 1610      SLP LONG TERM GOAL #1   Title Patient will verbalize her family's names independently as reported by patient/family.    Time 6    Period Weeks    Status On-going      SLP LONG TERM GOAL #2   Title Patient will identify 3 strategies that assist her in word finding.    Time 6    Period Weeks    Status On-going             Plan - 10/23/20 0936    Clinical Impression Statement Pt is a 50 yo female who presented to the ED with R sided weakness on 09/25/20. During conversation, patient required husband to speak to her history and concerns due to significant expressive deficits. WAB-Bedside and supplemental informal evaluation was completed today. WAB-Bedside revealed dx of Broca's Aphasia; nonfluent, effortful and agrammatic speech, with impaired repetition and relatively intact auditory comprehension. See note for more details. Informal assessment revealed pt benefitted from phonemic cueing and cloze sentences. Automatic speech is impaired, but she is stimuable to complete with less phonemic substitutions noted. SLP rec skilled speech services to address moderate-to-severe aphasia to increase her confidence, participation in ADLs, and be able to voice her needs/wants.    Speech Therapy Frequency 2x / week    Duration 8 weeks    Treatment/Interventions Cognitive reorganization;Multimodal communcation approach;Language facilitation;Compensatory techniques;Internal/external aids;SLP instruction and feedback;Environmental controls;Patient/family education;Functional tasks;Cueing hierarchy    Potential to Achieve Goals Good    Potential Considerations Ability to learn/carryover information    Consulted and Agree with Plan of Care Patient;Family member/caregiver    Family Member Consulted Husband           Patient will benefit from skilled therapeutic intervention in order to improve the following deficits and impairments:   Aphasia    Problem List Patient Active Problem List   Diagnosis Date Noted  . Acute ischemic stroke ([facility]) 09/25/2020  . History of ischemic left MCA stroke s/p tPA 06/06/2020  . History of repair of congenital atrial septal defect (ASD) 06/06/2020  . Hyperlipidemia LDL goal <70 06/06/2020  . Received intravenous tissue plasminogen activator (tPA) in emergency department   . Hypothyroid  12/22/2011  . Stress incontinence 12/22/2011  . Family history of kidney disease 12/22/2011  . Routine gynecological examination 12/22/2011  . Routine general medical examination at a health care facility 12/19/2011    Michelene Gardener [place] MS, [place], CBIS  10/23/2020, 11:23 AM  [facility] [address] [place], [place], [address] Phone: [phone]   Fax:  [phone]   Name: BRENDALEE MATTHIES MRN: [id number] Date of Birth: [birth year]

## 2020-10-26 NOTE — Telephone Encounter (Signed)
I called the patient to discuss further cardiology follow-up. There is a major communication barrier because of her aphasia. I spoke with her daughter. I will reach out to neurology to discuss whether we can be of any further help in her case. She has neuro follow-up this week.

## 2020-10-28 ENCOUNTER — Encounter: Payer: Self-pay | Admitting: Speech Pathology

## 2020-10-28 ENCOUNTER — Ambulatory Visit: Payer: BC Managed Care – PPO | Admitting: Speech Pathology

## 2020-10-28 ENCOUNTER — Other Ambulatory Visit: Payer: Self-pay

## 2020-10-28 DIAGNOSIS — R41840 Attention and concentration deficit: Secondary | ICD-10-CM | POA: Diagnosis not present

## 2020-10-28 DIAGNOSIS — R4701 Aphasia: Secondary | ICD-10-CM | POA: Diagnosis not present

## 2020-10-28 DIAGNOSIS — I63412 Cerebral infarction due to embolism of left middle cerebral artery: Secondary | ICD-10-CM | POA: Diagnosis not present

## 2020-10-28 DIAGNOSIS — I69318 Other symptoms and signs involving cognitive functions following cerebral infarction: Secondary | ICD-10-CM | POA: Diagnosis not present

## 2020-10-28 DIAGNOSIS — R41841 Cognitive communication deficit: Secondary | ICD-10-CM | POA: Diagnosis not present

## 2020-10-28 NOTE — Telephone Encounter (Signed)
Dr. Excell Seltzer sent [name] (Neurology provider) message instructing her to let him know if she'd like Dr. Excell Seltzer to see her after evaluation (see sees her 10/30/20).

## 2020-10-28 NOTE — Therapy (Signed)
[facility] [address] [place], [place], [address] Phone: [phone]   Fax:  [phone]  Speech Language Pathology Treatment  Patient Details  Name: [name] MRN: [id number] Date of Birth: 18-Mar-1971 Referring Provider (SLP): Beryle Beams MD   Encounter Date: 10/28/2020   End of Session - 10/28/20 1107    Visit Number 8    Number of Visits 17    Date for SLP Re-Evaluation 12/02/20    SLP Start Time 0930    SLP Stop Time  1015    SLP Time Calculation (min) 45 min    Activity Tolerance Patient tolerated treatment well           Past Medical History:  Diagnosis Date  . Hx of migraines   . Stroke [facility])    06/03/20    Past Surgical History:  Procedure Laterality Date  . BUBBLE STUDY  03/13/2020   Procedure: BUBBLE STUDY;  Surgeon: Quintella Reichert, MD;  Location: [facility] ENDOSCOPY;  Service: Cardiovascular;;  . ENDOMETRIAL ABLATION     Uterine/heavy menses  . IR ANGIO INTRA EXTRACRAN SEL COM CAROTID INNOMINATE BILAT MOD SED  06/06/2020  . IR ANGIO VERTEBRAL SEL VERTEBRAL BILAT MOD SED  06/06/2020  . IR CT HEAD LTD  09/25/2020  . IR PERCUTANEOUS ART THROMBECTOMY/INFUSION INTRACRANIAL INC DIAG ANGIO  09/25/2020  . IR US GUIDE VASC ACCESS RIGHT  06/06/2020  . RADIOLOGY WITH ANESTHESIA N/A 09/25/2020   Procedure: RADIOLOGY WITH ANESTHESIA;  Surgeon: Radiologist, Medication, MD;  Location: [facility] OR;  Service: Radiology;  Laterality: N/A;  . SHOULDER SURGERY     Left,/bone spurs  . TEE WITHOUT CARDIOVERSION N/A 03/13/2020   Procedure: TRANSESOPHAGEAL ECHOCARDIOGRAM (TEE);  Surgeon: Quintella Reichert, MD;  Location: [facility] ENDOSCOPY;  Service: Cardiovascular;  Laterality: N/A;    There were no vitals filed for this visit.   Subjective Assessment - 10/28/20 0934    Subjective Pt reports she did not sleep well.    Currently in Pain? No/denies                 ADULT SLP TREATMENT - 10/28/20 1103      General Information    Behavior/Cognition Alert;Cooperative;Pleasant mood      Treatment Provided   Treatment provided Cognitive-Linquistic      Cognitive-Linquistic Treatment   Treatment focused on Aphasia    Skilled Treatment Elicited verbal productions of single and two-syllable words. SLP facilitated use of articulatory strategies to assist with correct production of target word (i.e. slowed rate, breaking the word up, tongue placement). SLP addressed /th/ this session in initial, medial, and final position in single words. Instructed patient in constructing verbal sentences and practiced written sentences given a model on the white board.      Assessment / Recommendations / Plan   Plan Continue with current plan of care      Progression Toward Goals   Progression toward goals Progressing toward goals              SLP Short Term Goals - 10/28/20 1108      SLP SHORT TERM GOAL #1   Title Pt will produce automatic speech (name, counting, days, months) to increase ease and fluency of expressive communication.    Time 1    Period Weeks    Status Achieved      SLP SHORT TERM GOAL #2   Title Pt will complete a given sentence with a single word without phonemic  errors (brush your ____) given a verbal model.    Time 1    Period Weeks    Status Achieved      SLP SHORT TERM GOAL #3   Title Pt will repeat single words without phonemic errors given <2 verbal models.    Time 1    Period Weeks    Status On-going            SLP Long Term Goals - 10/28/20 1109      SLP LONG TERM GOAL #1   Title Patient will verbalize her family's names independently as reported by patient/family.    Time 5    Period Weeks    Status On-going      SLP LONG TERM GOAL #2   Title Patient will identify 3 strategies that assist her in word finding.    Time 5    Period Weeks    Status On-going            Plan - 10/28/20 1108    Clinical Impression Statement Pt is a 50 yo female who presented to the ED with R  sided weakness on 09/25/20. During conversation, patient required husband to speak to her history and concerns due to significant expressive deficits. WAB-Bedside and supplemental informal evaluation was completed today. WAB-Bedside revealed dx of Broca's Aphasia; nonfluent, effortful and agrammatic speech, with impaired repetition and relatively intact auditory comprehension. See note for more details. Informal assessment revealed pt benefitted from phonemic cueing and cloze sentences. Automatic speech is impaired, but she is stimuable to complete with less phonemic substitutions noted. SLP rec skilled speech services to address moderate-to-severe aphasia to increase her confidence, participation in ADLs, and be able to voice her needs/wants.    Speech Therapy Frequency 2x / week    Duration 8 weeks    Treatment/Interventions Cognitive reorganization;Multimodal communcation approach;Language facilitation;Compensatory techniques;Internal/external aids;SLP instruction and feedback;Environmental controls;Patient/family education;Functional tasks;Cueing hierarchy    Potential to Achieve Goals Good    Potential Considerations Ability to learn/carryover information    Consulted and Agree with Plan of Care Patient;Family member/caregiver    Family Member Consulted Husband           Patient will benefit from skilled therapeutic intervention in order to improve the following deficits and impairments:   Aphasia    Problem List Patient Active Problem List   Diagnosis Date Noted  . Acute ischemic stroke (HCC) 09/25/2020  . History of ischemic left MCA stroke s/p tPA 06/06/2020  . History of repair of congenital atrial septal defect (ASD) 06/06/2020  . Hyperlipidemia LDL goal <70 06/06/2020  . Received intravenous tissue plasminogen activator (tPA) in emergency department   . Hypothyroid 12/22/2011  . Stress incontinence 12/22/2011  . Family history of kidney disease 12/22/2011  . Routine  gynecological examination 12/22/2011  . Routine general medical examination at a health care facility 12/19/2011    Michelene Gardener Martha MS, [place], CBIS  10/28/2020, 11:09 AM  [facility] [address] [place], [place], [address] Phone: [phone]   Fax:  [phone]   Name: [name] MRN: [id number] Date of Birth: [birth year]

## 2020-10-30 ENCOUNTER — Ambulatory Visit (INDEPENDENT_AMBULATORY_CARE_PROVIDER_SITE_OTHER): Payer: BC Managed Care – PPO | Admitting: Adult Health

## 2020-10-30 ENCOUNTER — Encounter: Payer: Self-pay | Admitting: Adult Health

## 2020-10-30 VITALS — BP 112/73 | HR 66 | Ht 64.0 in | Wt 183.0 lb

## 2020-10-30 DIAGNOSIS — I69320 Aphasia following cerebral infarction: Secondary | ICD-10-CM

## 2020-10-30 DIAGNOSIS — I639 Cerebral infarction, unspecified: Secondary | ICD-10-CM

## 2020-10-30 MED ORDER — CLOPIDOGREL BISULFATE 75 MG PO TABS: 75.0000 mg | ORAL_TABLET | Freq: Every day | ORAL | 3 refills | Status: DC

## 2020-10-30 NOTE — Progress Notes (Signed)
[facility] [address] [place].  [address] [phone]       STROKE FOLLOW UP NOTE  [name] Date of Birth:  03-Jul-1971 Medical Record Number:  [id number]   Reason for Referral: Recurrent ESUS    SUBJECTIVE:   CHIEF COMPLAINT:  Chief Complaint  Patient presents with  . Follow-up    TR with spouse [name]) Pt has had another stroke since last visit, is having speech difficulty and some confusion.    HPI:   Today, 10/30/2020, [name] returns for stroke follow-up after prior visit with Dr. Pearlean Brownie 4 months ago accompanied by her husband.  She was readmitted to [facility] ED on 09/25/2020 with aphasia, right facial droop, right-sided weakness and left gaze found to have left MCA infarct in setting of left M2/M3 occlusion s/p tPA and IR with left MCA TICI 2C reperfusion, embolic secondary to unclear source.  Recommended aspirin and Brilinta for 4 weeks and aspirin alone.  LDL 23.  A1c 5.4.  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  Discharged home on 09/29/2020 with recommended outpatient PT/SLP.  Continues to work with SLP for residual Broca's aphasia which has been gradually improving.  At times can worsen with increased stress, fatigued or heightened emotions.  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  Denies new stroke/TIA symptoms.  Completed 4 weeks of Brilinta and has remained on aspirin alone without associated side effects Compliant on atorvastatin 20 mg daily without associated side effects Blood pressure today 112/73 Loop recorder has not shown atrial fibrillation thus far  Both husband and patient are understandably  frustrated about continued strokes with unknown cause and lack of further treatment or prevention options.  Discussed Brien Mates trial but she declines interest and questions second opinion from a different neurologist.  Family/friends recommended Jay Schlichter, MD who is a vascular neurologist at [facility].   No further concerns at this time     History provided for reference purposes only Update 07/01/2020 Dr. Pearlean Brownie: Patient returns for follow-up after last visit with [name] 6 weeks ago.  She was readmitted to [facility] on 06/03/2020 with sudden onset of left arm weakness and difficulty lifting and heaviness.  This happened a few days after she had switched from dual antiplatelet therapy to aspirin only.  MRI scan showed a right frontal precentral gyrus embolic infarct of cryptogenic etiology.  MRA of the brain and neck showed no large vessel stenosis or occlusion.  She had previously had a cardiac CT in 05/23/2020 which had shown no PFO or shunt and 2D echo and TEE in September which were unremarkable.  Loop interrogation this admission showed no paroxysmal A. fib.  LP was offered but she declined.  Cerebral angiogram was obtained to look for cerebral vasculitis but was negative.  LDL cholesterol was 64 mg percent and hemoglobin A1c was 5.3.  She was restarted on dual antiplatelet therapy aspirin 81 mg and Plavix 75 mg daily and continued deferred to come back to see me today.  She states she is done well.  Her left arm weakness has resolved completely.  She has slightly diminished fine motor skills in the left hand but no real weakness.  She is  tolerating Plavix well without bruising or bleeding as well as Lipitor without muscle aches and pains.  Her blood pressures well controlled today it is 118/69.  She has no new complaints.  She is eating healthy and losing weight  Hospital follow-up 05/20/2020 JM: [name] is being seen for hospital follow-up accompanied by her husband.  She reports  initially doing well at discharge but over the past month (approx 1 month post discharge), she has been experiencing word finding difficulties as well as frequent bilateral temporal and occipital headaches.  She does have history of migraines but has not experienced a migraine "in many years".  Headache consistent with throbbing sensation but denies photophobia, phonophobia or nausea/vomiting.  Can occur up to 2-3 times monthly and may last for 3 to 4 days.  She will take Tylenol with some benefit.  She has returned back to work at [employer] dermatology without great difficulty but reports even her colleagues have noticed occasional speech difficulty.  She has remained on both aspirin and Plavix without bleeding or bruising as well as atorvastatin 10 mg daily without myalgias.  Further review of hypercoagulable and autoimmune labs pending at discharge unremarkable.  She was evaluated by Dr. Excell Seltzer on 05/12/2020 and personally reviewed the office note. Per Dr. Excell Seltzer, he reviewed TEE and I did not appreciate any clear evidence of PFO and TCD negative therefore recommended pursuing cardiac CTA to further assess for cardiac anomalies which is scheduled for this Friday.  He believes increased risk of atrial fibrillation with history of prior heart surgery and continued presence of right atrial dilation and tricuspid regurgitation and referred to EP for further consideration of loop recorder.  She has appointment scheduled today with Dr. Lalla Brothers for further discussion of loop recorder.  No further concerns at this time.  Stroke admission 03/11/2020 JamieAileena L Hamiltonis a 49 y.o.femalewith history of hashimoto thyroiditis and ASD closure 30 years ago who presented to [facility] ED on 03/11/2020 for right arm and leg weakness.  Personally reviewed hospitalization pertinent progress notes, lab work and imaging with summary provided.  Evaluated by Dr. Roda Shutters with stroke work-up revealing right parietal occipital region and left  frontal lobe small infarcts s/p tPA, infarcts embolic secondary to unknown source concerning for ASD/PFO related as TEE positive for PFO.  Refer to Dr. Excell Seltzer for outpatient evaluation of PFO.  Hypercoagulable and autoimmune labs pending at discharge.  Recommended DAPT until evaluation with Dr. Excell Seltzer outpatient.  LDL 94 and initiate atorvastatin 20 mg daily. No hx of HTN or DM.  History of Hashimoto thyroiditis not on Synthroid PTA per husband with TSH 6.13 (but FT4 normal), thyroid peroxidase Ab 55 and thyroglobulin Ab 1.5 and advised follow-up with PCP outpatient for further management.  Her stroke risk factors include hx of ASD closure at age 68 and obesity.  No prior stroke history.  Evaluated by therapies and discharged home in stable condition without therapy needs on 03/14/2020.  Stroke: right parieto-occipital region and left frontal lobe small infarcts, embolic pattern, source uncertain, concerning for ASD/PFO related  CT head no acute finding  CTA head and neck neg  MRI Brain right parieto-occipital region and left frontal lobe small infartcts  MRI C-spine normal cervical spinal cord  2D Echo EF 50-55%  TEE unremarkable butpositive for PFO  TCD bubble study - negative  LE venous doppler - negative  LDL 94  HgbA1c 5.3  UDS neg  hypercoag and autoimmune labs - so far neg, rest pending  No indication for  LP or heart monitoring at this time - will refer to Dr. Pearlean BrownieSethi for second opinion  SCDs for VTE prophylaxis  No antithromboticprior to admission, now on ASA 81 and plavix 75 DAPT for stroke prevention. Continue DAPT until sees Dr. Excell Seltzerooper as outpt.  Patient counseled to be compliant withherantithrombotic medications  Ongoing aggressive stroke risk factor management  Therapy recommendations: None  Disposition: Discharge to home      ROS:   14 system review of systems performed and negative with exception of those listed in HPI  PMH:  Past Medical History:   Diagnosis Date  . Hx of migraines   . Stroke [facility](HCC)    06/03/20    PSH:  Past Surgical History:  Procedure Laterality Date  . BUBBLE STUDY  03/13/2020   Procedure: BUBBLE STUDY;  Surgeon: Quintella Reicherturner, Traci R, MD;  [facility];  Service: Cardiovascular;;  . ENDOMETRIAL ABLATION     Uterine/heavy menses  . IR ANGIO INTRA EXTRACRAN SEL COM CAROTID INNOMINATE BILAT MOD SED  06/06/2020  . IR ANGIO VERTEBRAL SEL VERTEBRAL BILAT MOD SED  06/06/2020  . IR CT HEAD LTD  09/25/2020  . IR PERCUTANEOUS ART THROMBECTOMY/INFUSION INTRACRANIAL INC DIAG ANGIO  09/25/2020  . IR US GUIDE VASC ACCESS RIGHT  06/06/2020  . RADIOLOGY WITH ANESTHESIA N/A 09/25/2020   Procedure: RADIOLOGY WITH ANESTHESIA;  Surgeon: Radiologist, Medication, MD;  Location: [facility] OR;  Service: Radiology;  Laterality: N/A;  . SHOULDER SURGERY     Left,/bone spurs  . TEE WITHOUT CARDIOVERSION N/A 03/13/2020   Procedure: TRANSESOPHAGEAL ECHOCARDIOGRAM (TEE);  Surgeon: Quintella Reicherturner, Traci R, MD;  [facility];  Service: Cardiovascular;  Laterality: N/A;    Social History:  Social History   Socioeconomic History  . Marital status: Married    Spouse name: Not on file  . Number of children: Not on file  . Years of education: Not on file  . Highest education level: Not on file  Occupational History  . Occupation: full time  Tobacco Use  . Smoking status: Former Games developermoker  . Smokeless tobacco: Never Used  Substance and Sexual Activity  . Alcohol use: Yes    Alcohol/week: 1.0 standard drink    Types: 1 Cans of beer per week    Comment: states 1 beer every other day or so   . Drug use: Never  . Sexual activity: Not on file  Other Topics Concern  . Not on file  Social History Narrative   Last Mamm: 04.07.10 @ [facility] BI-RADS Category 1: Negative^MM DIGITAL DG BILATERAL   Lives with husband and 2 daughters   Left Handed   Drinks no caffeine   Social Determinants of Health   Financial Resource Strain: Not on file  Food  Insecurity: Not on file  Transportation Needs: No Transportation Needs  . Lack of Transportation (Medical): No  . Lack of Transportation (Non-Medical): No  Physical Activity: Not on file  Stress: Not on file  Social Connections: Not on file  Intimate Partner Violence: Not on file    Family History:  Family History  Problem Relation Age of Onset  . Hypertension Mother   . Hypothyroidism Mother   . High Cholesterol Mother   . Atrial fibrillation Mother   . COPD Father   . Emphysema Father   . Hypothyroidism Sister     Medications:   Current Outpatient Medications on File Prior to Visit  Medication Sig Dispense Refill  . aspirin EC 81 MG EC tablet Take 1 tablet (  81 mg total) by mouth daily. Swallow whole. 30 tablet 11  . atorvastatin (LIPITOR) 20 MG tablet Take 1 tablet (20 mg total) by mouth daily. 90 tablet 3  . pantoprazole (PROTONIX) 40 MG tablet Take 1 tablet (40 mg total) by mouth daily. 30 tablet 0  . Rimegepant Sulfate 75 MG TBDP Take 75 mg by mouth once as needed for migraine.     No current facility-administered medications on file prior to visit.    Allergies:   Allergies  Allergen Reactions  . Bee Venom Swelling      OBJECTIVE:  Physical Exam  Vitals:   10/30/20 1059  BP: 112/73  Pulse: 66  Weight: 183 lb (83 kg)  Height: 5\' 4"  (1.626 m)   Body mass index is 31.41 kg/m. No exam data present   General: well developed, well nourished, pleasant middle-age Caucasian female, seated, in no evident distress Head: head normocephalic and atraumatic.   Neck: supple with no carotid or supraclavicular bruits Cardiovascular: regular rate and rhythm, no murmurs Musculoskeletal: no deformity Skin:  no rash/petichiae Vascular:  Normal pulses all extremities   Neurologic Exam Mental Status: Awake and fully alert.   Moderate expressive aphasia.  No evidence of dysarthria.  Able to follow simple commands without difficulty.  Oriented to place and time. Recent  and remote memory intact. Attention span, concentration and fund of knowledge appropriate. Mood and affect appropriate.  Cranial Nerves: Fundoscopic exam reveals sharp disc margins. Pupils equal, briskly reactive to light. Extraocular movements full without nystagmus. Visual fields full to confrontation. Hearing intact. Facial sensation intact.  Right facial nasolabial fold flattening.  Tongue and palate moves normally and symmetrically.  Motor: Normal bulk and tone. Normal strength in all tested extremity muscles. Sensory.: intact to touch , pinprick , position and vibratory sensation.  Coordination: Rapid alternating movements normal in all extremities. Finger-to-nose and heel-to-shin performed accurately bilaterally. Gait and Station: Arises from chair without difficulty. Stance is normal. Gait demonstrates normal stride length and balance without use of assistive device.  Able to tandem walk and heel toe without difficulty. Reflexes: 1+ and symmetric. Toes downgoing.     NIHSS  1 Modified Rankin  2      ASSESSMENT: MAALLE STARRETT is a 50 y.o. 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  Vascular risk factors include history of ASD and s/p closure 30 years ago, HLD, Hashimoto's thyroiditis, former tobacco use and history of menstrual migraines with  presented with right arm and leg weakness on 03/11/2020 with stroke work-up revealing right parieto-occipital region and left frontal temporal lobe small infarcts, s/p tPA, infarcts embolic secondary to unknown source concerning for ASD/PFO related. Vascular risk factors include hx of ASD closure at age 31, Hashimoto thyroiditis, HLD and obesity.  Initially recovered well without residual deficits but over the past month, reports new onset word finding difficulty and frequent headaches     PLAN:  1. Recurrent ESUS: a. Residual deficits: Moderate expressive aphasia although greatly improving since discharge. 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 Per patient request, okay to switch back to Plavix and continue atorvastatin for secondary stroke prevention.  4-week Brilinta and aspirin completed. c. Loop recorder has not shown atrial fibrillation thus far d. Prior work-up: TCD negative.  TEE no significant IA shunt.  Cardiac CT no CAD, PFO  or IA shunt.  Cerebral angiogram negative for vasculitis.  Hypercoagulable and autoimmune labs unremarkable e. Continue to unknown etiology without further recommendations at this time.  Per patient request, will place referral to Jay Schlichter, MD vascular neurologist at [facility] f. Discussed secondary stroke prevention measures and importance of close PCP follow up for aggressive stroke risk factor management      Follow up in 3 months or call earlier if needed   CC:  [facility] provider: Dr. Margaretmary Dys, Audrie Gallus, MD    I spent 60 minutes of face-to-face and non-face-to-face time with patient and husband.  This included discussion regarding recent hospitalization and recurrent cryptogenic stroke, long discussion regarding unknown etiologies and second opinion, residual deficits and continued participation with therapy and educated on typical recovery time, long discussion regarding lack of further treatment options or evaluations and answered all other questions to patient and husband satisfaction    Ihor Austin, AGNP-BC  [facility] [address] [place], [place] [address]  Phone [phone] Fax [phone] Note: This document was prepared with digital dictation and possible smart phrase technology. Any transcriptional errors that result from this process are unintentional.

## 2020-10-30 NOTE — Patient Instructions (Addendum)
Your Plan:  Return back to plavix and stop aspirin I will look into your chart to ensure nothing else is missed  Referral will be placed to Dr. Gevena Cotton        Thank you for coming to see [place] at [facility]. I hope we have been able to provide you high quality care today.  You may receive a patient satisfaction survey over the next few weeks. We would appreciate your feedback and comments so that we may continue to improve ourselves and the health of our patients.

## 2020-10-30 NOTE — Progress Notes (Signed)
I agree with the above plan 

## 2020-10-31 ENCOUNTER — Other Ambulatory Visit: Payer: Self-pay

## 2020-10-31 ENCOUNTER — Encounter: Payer: Self-pay | Admitting: Speech Pathology

## 2020-10-31 ENCOUNTER — Ambulatory Visit: Payer: BC Managed Care – PPO | Admitting: Speech Pathology

## 2020-10-31 ENCOUNTER — Telehealth: Payer: Self-pay | Admitting: Cardiology

## 2020-10-31 DIAGNOSIS — R4701 Aphasia: Secondary | ICD-10-CM

## 2020-10-31 DIAGNOSIS — I69318 Other symptoms and signs involving cognitive functions following cerebral infarction: Secondary | ICD-10-CM | POA: Diagnosis not present

## 2020-10-31 DIAGNOSIS — I63412 Cerebral infarction due to embolism of left middle cerebral artery: Secondary | ICD-10-CM | POA: Diagnosis not present

## 2020-10-31 DIAGNOSIS — R41840 Attention and concentration deficit: Secondary | ICD-10-CM | POA: Diagnosis not present

## 2020-10-31 DIAGNOSIS — R41841 Cognitive communication deficit: Secondary | ICD-10-CM | POA: Diagnosis not present

## 2020-10-31 NOTE — Therapy (Signed)
[facility] [address] [place], [place], [address] Phone: [phone]   Fax:  [phone]  Speech Language Pathology Treatment  Patient Details  Name: [name] MRN: [id number] Date of Birth: 03-21-1971 Referring Provider (SLP): Beryle Beams MD   Encounter Date: 10/31/2020   End of Session - 10/31/20 0924    Visit Number 9    Number of Visits 17    Date for SLP Re-Evaluation 12/02/20    SLP Start Time 0855    SLP Stop Time  0929    SLP Time Calculation (min) 34 min    Activity Tolerance Patient tolerated treatment well           Past Medical History:  Diagnosis Date  . Hx of migraines   . Stroke [facility])    06/03/20    Past Surgical History:  Procedure Laterality Date  . BUBBLE STUDY  03/13/2020   Procedure: BUBBLE STUDY;  Surgeon: Quintella Reichert, MD;  Location: [facility] ENDOSCOPY;  Service: Cardiovascular;;  . ENDOMETRIAL ABLATION     Uterine/heavy menses  . IR ANGIO INTRA EXTRACRAN SEL COM CAROTID INNOMINATE BILAT MOD SED  06/06/2020  . IR ANGIO VERTEBRAL SEL VERTEBRAL BILAT MOD SED  06/06/2020  . IR CT HEAD LTD  09/25/2020  . IR PERCUTANEOUS ART THROMBECTOMY/INFUSION INTRACRANIAL INC DIAG ANGIO  09/25/2020  . IR US GUIDE VASC ACCESS RIGHT  06/06/2020  . RADIOLOGY WITH ANESTHESIA N/A 09/25/2020   Procedure: RADIOLOGY WITH ANESTHESIA;  Surgeon: Radiologist, Medication, MD;  Location: [facility] OR;  Service: Radiology;  Laterality: N/A;  . SHOULDER SURGERY     Left,/bone spurs  . TEE WITHOUT CARDIOVERSION N/A 03/13/2020   Procedure: TRANSESOPHAGEAL ECHOCARDIOGRAM (TEE);  Surgeon: Quintella Reichert, MD;  Location: [facility] ENDOSCOPY;  Service: Cardiovascular;  Laterality: N/A;    There were no vitals filed for this visit.          ADULT SLP TREATMENT - 10/31/20 0933      General Information   Behavior/Cognition Alert;Cooperative;Pleasant mood      Treatment Provided   Treatment provided Cognitive-Linquistic       Cognitive-Linquistic Treatment   Treatment focused on Aphasia    Skilled Treatment Elicited verbal productions of nouns and verbs into sentences. Pt was able to produce single words and sentences with minA. Pt was able to copy sentences with min-modA.  Trained patient husband in how to commmunicate when "stuck" on a word at home. Pt and husband demonstrated understanding and      Assessment / Recommendations / Plan   Plan Continue with current plan of care      Progression Toward Goals   Progression toward goals Progressing toward goals              SLP Short Term Goals - 10/31/20 0929      SLP SHORT TERM GOAL #1   Title Pt will produce automatic speech (name, counting, days, months) to increase ease and fluency of expressive communication.    Time 1    Period Weeks    Status Achieved      SLP SHORT TERM GOAL #2   Title Pt will complete a given sentence with a single word without phonemic errors (brush your ____) given a verbal model.    Time 1    Period Weeks    Status Achieved      SLP SHORT TERM GOAL #3   Title Pt will repeat single words without phonemic errors given <  2 verbal models.    Time 1    Period Weeks    Status Achieved            SLP Long Term Goals - 10/31/20 0258      SLP LONG TERM GOAL #1   Title Patient will verbalize her family's names independently as reported by patient/family.    Time 5    Period Weeks    Status On-going      SLP LONG TERM GOAL #2   Title Patient will identify 3 strategies that assist her in word finding.    Time 5    Period Weeks    Status On-going            Plan - 10/31/20 5277    Clinical Impression Statement Patient continues to make progress towards aphasia goals. She is able to compete full sentences in a structured environment. Spoke with husband about how to best communicate in a situation where she has trouble getting her words out. Continue to speech therapy for patient to be able to communicate wants/needs.     Speech Therapy Frequency 2x / week    Duration 8 weeks    Treatment/Interventions Cognitive reorganization;Multimodal communcation approach;Language facilitation;Compensatory techniques;Internal/external aids;SLP instruction and feedback;Environmental controls;Patient/family education;Functional tasks;Cueing hierarchy    Potential to Achieve Goals Good    Potential Considerations Ability to learn/carryover information    Consulted and Agree with Plan of Care Patient;Family member/caregiver    Family Member Consulted Husband           Patient will benefit from skilled therapeutic intervention in order to improve the following deficits and impairments:   Aphasia    Problem List Patient Active Problem List   Diagnosis Date Noted  . Acute ischemic stroke (HCC) 09/25/2020  . History of ischemic left MCA stroke s/p tPA 06/06/2020  . History of repair of congenital atrial septal defect (ASD) 06/06/2020  . Hyperlipidemia LDL goal <70 06/06/2020  . Received intravenous tissue plasminogen activator (tPA) in emergency department   . Hypothyroid 12/22/2011  . Stress incontinence 12/22/2011  . Family history of kidney disease 12/22/2011  . Routine gynecological examination 12/22/2011  . Routine general medical examination at a health care facility 12/19/2011    Michelene Gardener [place] MS, [place], CBIS  10/31/2020, 10:08 AM  [facility] [address] [place], [place], [address] Phone: [phone]   Fax:  [phone]   Name: [name] MRN: [id number] Date of Birth: 08-10-1970

## 2020-10-31 NOTE — Telephone Encounter (Signed)
PT is calling to return Dr.Turner's nurse call.Please advise

## 2020-10-31 NOTE — Telephone Encounter (Signed)
Spoke with the patient's husband. Informed him that Dr. Excell Seltzer spoke to the patient and her daughter last week and told them to keep Neurology appointment 10/30/20. Dr. Excell Seltzer sent a message to Neurology and said to reach out if cardiology follow-up was needed. Reiterated to the patient's husband that there was no residual shunting on CTA study. Reiterated to him importance of getting and keeping appointment with [facility] per Neurology. He understands to call Dr. Mayford Knife for further cardiac needs. He was grateful for call and agrees with plan.

## 2020-10-31 NOTE — Telephone Encounter (Signed)
Spoke with the patient's husband who states that Dr. Excell Seltzer called the patient last week and he was not home so was unable to talk with him. He is wondering what the next steps are for the patient. I advised him that Dr. Excell Seltzer was waiting until she followed up with neurology and then he was going to discuss with them if she needed cardiac evaluation. Advised that Dr. Earmon Phoenix nurse has reached out to [facility] and we will be in touch with them if cardiac workup is warranted. Patient's husband understanding and thanked me for the call.

## 2020-11-03 ENCOUNTER — Ambulatory Visit (INDEPENDENT_AMBULATORY_CARE_PROVIDER_SITE_OTHER): Payer: BC Managed Care – PPO

## 2020-11-03 DIAGNOSIS — I639 Cerebral infarction, unspecified: Secondary | ICD-10-CM | POA: Diagnosis not present

## 2020-11-04 ENCOUNTER — Other Ambulatory Visit: Payer: Self-pay

## 2020-11-04 ENCOUNTER — Encounter: Payer: Self-pay | Admitting: Speech Pathology

## 2020-11-04 ENCOUNTER — Encounter: Payer: Self-pay | Admitting: Adult Health

## 2020-11-04 ENCOUNTER — Ambulatory Visit: Payer: BC Managed Care – PPO | Attending: Neurology | Admitting: Speech Pathology

## 2020-11-04 DIAGNOSIS — R4701 Aphasia: Secondary | ICD-10-CM | POA: Diagnosis not present

## 2020-11-04 LAB — CUP PACEART REMOTE DEVICE CHECK
Date Time Interrogation Session: 20220501084539
Implantable Pulse Generator Implant Date: 20211116

## 2020-11-04 NOTE — Therapy (Signed)
[place]. [place], [place], [address] Phone: [phone]   Fax:  [phone]  Speech Language Pathology Treatment  Patient Details  Name: [name] MRN: [id number] Date of Birth: [birth year] Referring Provider (SLP): Phillips Odor MD   Encounter Date: 11/04/2020   End of Session - 11/04/20 0846    Visit Number 10    Number of Visits 17    Date for SLP Re-Evaluation 12/02/20    SLP Start Time 0845    SLP Stop Time  0930    SLP Time Calculation (min) 45 min    Activity Tolerance Patient tolerated treatment well           Past Medical History:  Diagnosis Date  . Hx of migraines   . Stroke [facility])    06/03/20    Past Surgical History:  Procedure Laterality Date  . BUBBLE STUDY  03/13/2020   Procedure: BUBBLE STUDY;  Surgeon: Sueanne Margarita, MD;  Location: [facility] ENDOSCOPY;  Service: Cardiovascular;;  . ENDOMETRIAL ABLATION     Uterine/heavy menses  . IR ANGIO INTRA EXTRACRAN SEL COM CAROTID INNOMINATE BILAT MOD SED  06/06/2020  . IR ANGIO VERTEBRAL SEL VERTEBRAL BILAT MOD SED  06/06/2020  . IR CT HEAD LTD  09/25/2020  . IR PERCUTANEOUS ART THROMBECTOMY/INFUSION INTRACRANIAL INC DIAG ANGIO  09/25/2020  . IR US GUIDE VASC ACCESS RIGHT  06/06/2020  . RADIOLOGY WITH ANESTHESIA N/A 09/25/2020   Procedure: RADIOLOGY WITH ANESTHESIA;  Surgeon: Radiologist, Medication, MD;  Location: [place];  Service: Radiology;  Laterality: N/A;  . SHOULDER SURGERY     Left,/bone spurs  . TEE WITHOUT CARDIOVERSION N/A 03/13/2020   Procedure: TRANSESOPHAGEAL ECHOCARDIOGRAM (TEE);  Surgeon: Sueanne Margarita, MD;  Location: [facility] ENDOSCOPY;  Service: Cardiovascular;  Laterality: N/A;    There were no vitals filed for this visit.              SLP Short Term Goals - 11/04/20 9417      SLP SHORT TERM GOAL #1   Title Pt will copy written sentences with no errors given no additional cues.    Time 4    Period Weeks    Status New       SLP SHORT TERM GOAL #2   Title Pt's husband/family will report use Supported Sales executive for Aphasia (SCA) strategies during a communication breakdown to increase success in communicative interactions.    Time 4    Period Weeks    Status New      SLP SHORT TERM GOAL #3   Title Pt will produce SVO (subject/verb/object) sentences with VnesT treatment given minA.    Time 4    Period Weeks    Status New      SLP SHORT TERM GOAL #4   Title Pt will provide 3+ semantic features of a given object using semantic feature anaylsis (SFA).    Time 4    Period Weeks    Status New            SLP Long Term Goals - 11/04/20 0951      SLP LONG TERM GOAL #1   Title Patient will verbalize her family's names independently as reported by patient/family.    Time 4    Period Weeks    Status Achieved      SLP LONG TERM GOAL #2   Title Patient will identify 3 strategies that assist her in word finding.    Time  4    Period Weeks    Status Achieved      SLP LONG TERM GOAL #3   Title Pt will be able to communicate basic information in 4+ word sentences.    Time 4    Period Weeks    Status New      SLP LONG TERM GOAL #4   Title Pt will be able to write a 4+ word, verbally-dictated sentence with no written errors.    Time 4    Period Weeks    Status New            Plan - 11/04/20 8138    Clinical Impression Statement Patient has met all STG. Goals to be updated to reflect level.  She is able to compete full sentences in a structured environment. Required more cueing with VnesT when not provided with a picture. . Continue to speech therapy for patient to be able to communicate wants/needs.    Speech Therapy Frequency 2x / week    Duration 8 weeks    Treatment/Interventions Cognitive reorganization;Multimodal communcation approach;Language facilitation;Compensatory techniques;Internal/external aids;SLP instruction and feedback;Environmental controls;Patient/family education;Functional  tasks;Cueing hierarchy    Potential to Achieve Goals Good    Potential Considerations Ability to learn/carryover information    Consulted and Agree with Plan of Care Patient;Family member/caregiver    Family Member Consulted Husband           Patient will benefit from skilled therapeutic intervention in order to improve the following deficits and impairments:   Aphasia    Problem List Patient Active Problem List   Diagnosis Date Noted  . Acute ischemic stroke ([place]) 09/25/2020  . History of ischemic left MCA stroke s/p tPA 06/06/2020  . History of repair of congenital atrial septal defect (ASD) 06/06/2020  . Hyperlipidemia LDL goal <70 06/06/2020  . Received intravenous tissue plasminogen activator (tPA) in emergency department   . Hypothyroid 12/22/2011  . Stress incontinence 12/22/2011  . Family history of kidney disease 12/22/2011  . Routine gynecological examination 12/22/2011  . Routine general medical examination at a health care facility 12/19/2011   Speech Therapy Progress Note  Dates of Reporting Period: 10/02/20 to 12/02/20  Objective Reports of Subjective Statement: Pt reports continued difficulty in conversation with family. 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   Objective Measurements: See tx note  Goal Update: All goals met and updated.  Plan: Continue to address aphasia at structured sentence level.   Reason Skilled Services are Required: Pt requires continued speech therapy to address nonfluent aphasia so patient is able to verbalize wants/needs.   [place], [place], CBIS  11/04/2020, 9:54 AM  [place]. [place], [place], [address] Phone: [phone]   Fax:  [phone]   Name: [name] MRN: [id number] Date of Birth: Oct 11, 1970

## 2020-11-05 ENCOUNTER — Telehealth: Payer: Self-pay | Admitting: *Deleted

## 2020-11-05 ENCOUNTER — Telehealth: Payer: Self-pay | Admitting: Adult Health

## 2020-11-05 NOTE — Telephone Encounter (Signed)
Both fmla forms @ the front desk for p/u

## 2020-11-05 NOTE — Telephone Encounter (Signed)
FMLA / STD forms completed by JM/NP signed.  To medical records.

## 2020-11-05 NOTE — Telephone Encounter (Signed)
Faxed referral for Dr. Jay Schlichter to [facility], Fax: [phone], Phone: [phone]

## 2020-11-06 ENCOUNTER — Encounter: Payer: Self-pay | Admitting: Speech Pathology

## 2020-11-06 ENCOUNTER — Ambulatory Visit: Payer: BC Managed Care – PPO | Admitting: Speech Pathology

## 2020-11-06 ENCOUNTER — Other Ambulatory Visit: Payer: Self-pay

## 2020-11-06 DIAGNOSIS — R4701 Aphasia: Secondary | ICD-10-CM

## 2020-11-06 NOTE — Therapy (Signed)
[facility] [address] [place], [place], [address] Phone: [phone]   Fax:  [phone]  Speech Language Pathology Treatment  Patient Details  Name: [name] MRN: [id number] Date of Birth: [birth year] Referring Provider (SLP): Beryle Beams MD   Encounter Date: 11/06/2020   End of Session - 11/06/20 0852    Visit Number 11    Number of Visits 17    Date for SLP Re-Evaluation 12/02/20    SLP Start Time 0845    SLP Stop Time  0930    SLP Time Calculation (min) 45 min    Activity Tolerance Patient tolerated treatment well           Past Medical History:  Diagnosis Date  . Hx of migraines   . Stroke [facility])    06/03/20    Past Surgical History:  Procedure Laterality Date  . BUBBLE STUDY  03/13/2020   Procedure: BUBBLE STUDY;  Surgeon: Quintella Reichert, MD;  Location: [facility] ENDOSCOPY;  Service: Cardiovascular;;  . ENDOMETRIAL ABLATION     Uterine/heavy menses  . IR ANGIO INTRA EXTRACRAN SEL COM CAROTID INNOMINATE BILAT MOD SED  06/06/2020  . IR ANGIO VERTEBRAL SEL VERTEBRAL BILAT MOD SED  06/06/2020  . IR CT HEAD LTD  09/25/2020  . IR PERCUTANEOUS ART THROMBECTOMY/INFUSION INTRACRANIAL INC DIAG ANGIO  09/25/2020  . IR US GUIDE VASC ACCESS RIGHT  06/06/2020  . RADIOLOGY WITH ANESTHESIA N/A 09/25/2020   Procedure: RADIOLOGY WITH ANESTHESIA;  Surgeon: Radiologist, Medication, MD;  Location: [facility] OR;  Service: Radiology;  Laterality: N/A;  . SHOULDER SURGERY     Left,/bone spurs  . TEE WITHOUT CARDIOVERSION N/A 03/13/2020   Procedure: TRANSESOPHAGEAL ECHOCARDIOGRAM (TEE);  Surgeon: Quintella Reichert, MD;  Location: [facility] ENDOSCOPY;  Service: Cardiovascular;  Laterality: N/A;    There were no vitals filed for this visit.   Subjective Assessment - 11/06/20 0848    Subjective Pt reports she feels well.    Patient is accompained by: Family member    Currently in Pain? No/denies                 ADULT SLP TREATMENT - 11/06/20  0911      General Information   Behavior/Cognition Alert;Cooperative;Pleasant mood      Treatment Provided   Treatment provided Cognitive-Linquistic      Cognitive-Linquistic Treatment   Treatment focused on Aphasia    Skilled Treatment Assessed patient with SCA while she attempted to describe objects to her communication partner. She required min-to-modA to describe objects - she benefited from focused questions to assist in communicating her message. Facilitated written expression by having her generate verbal sentences and then copying SLP's sentence. No errors noted this session.      Assessment / Recommendations / Plan   Plan Continue with current plan of care      Progression Toward Goals   Progression toward goals Progressing toward goals              SLP Short Term Goals - 11/06/20 0901      SLP SHORT TERM GOAL #1   Title Pt will copy written sentences with no errors given no additional cues.    Time 4    Period Weeks    Status On-going      SLP SHORT TERM GOAL #2   Title Pt's husband/family will report use Supported Psychologist, sport and exercise for Aphasia (SCA) strategies during a communication breakdown to increase success in communicative interactions.  Time 4    Period Weeks    Status On-going      SLP SHORT TERM GOAL #3   Title Pt will produce SVO (subject/verb/object) sentences with VnesT treatment given minA.    Time 4    Period Weeks    Status On-going      SLP SHORT TERM GOAL #4   Title Pt will provide 3+ semantic features of a given object using semantic feature anaylsis (SFA).    Time 4    Period Weeks    Status On-going            SLP Long Term Goals - 11/06/20 0903      SLP LONG TERM GOAL #1   Title Pt will be able to communicate basic information in 4+ word sentences.    Time 4    Period Weeks    Status On-going      SLP LONG TERM GOAL #2   Title Pt will be able to write a 4+ word, verbally-dictated sentence with no written errors.    Time 4     Period Weeks    Status On-going            Plan - 11/06/20 6767    Clinical Impression Statement Pt was with family member today. Worked on supported conversation. Pt practiced describing "the most obvious details" (group/look like/ where) to communication partner. Family member was able to correctly guess pt's object from pt's description. Continue to speech therapy for patient to be able to communicate wants/needs.    Speech Therapy Frequency 2x / week    Duration 8 weeks    Treatment/Interventions Cognitive reorganization;Multimodal communcation approach;Language facilitation;Compensatory techniques;Internal/external aids;SLP instruction and feedback;Environmental controls;Patient/family education;Functional tasks;Cueing hierarchy    Potential to Achieve Goals Good    Potential Considerations Ability to learn/carryover information    Consulted and Agree with Plan of Care Patient;Family member/caregiver    Family Member Consulted Husband           Patient will benefit from skilled therapeutic intervention in order to improve the following deficits and impairments:   Aphasia    Problem List Patient Active Problem List   Diagnosis Date Noted  . Acute ischemic stroke (HCC) 09/25/2020  . History of ischemic left MCA stroke s/p tPA 06/06/2020  . History of repair of congenital atrial septal defect (ASD) 06/06/2020  . Hyperlipidemia LDL goal <70 06/06/2020  . Received intravenous tissue plasminogen activator (tPA) in emergency department   . Hypothyroid 12/22/2011  . Stress incontinence 12/22/2011  . Family history of kidney disease 12/22/2011  . Routine gynecological examination 12/22/2011  . Routine general medical examination at a health care facility 12/19/2011    Michelene Gardener [place] MS, [place], CBIS  11/06/2020, 5:01 PM  [facility] [address] [place], [place], [address] Phone: [phone]   Fax:   [phone]   Name: [name] MRN: [id number] Date of Birth: 1971/05/22

## 2020-11-11 ENCOUNTER — Ambulatory Visit: Payer: BC Managed Care – PPO | Admitting: Speech Pathology

## 2020-11-11 ENCOUNTER — Encounter: Payer: Self-pay | Admitting: Speech Pathology

## 2020-11-11 ENCOUNTER — Other Ambulatory Visit: Payer: Self-pay

## 2020-11-11 DIAGNOSIS — R4701 Aphasia: Secondary | ICD-10-CM

## 2020-11-11 NOTE — Therapy (Signed)
[facility] [address] [place], [place], [address] Phone: [phone]   Fax:  [phone]  Speech Language Pathology Treatment  Patient Details  Name: FALLEN CRISOSTOMO MRN: [id number] Date of Birth: 1970/09/28 Referring Provider (SLP): Beryle Beams MD   Encounter Date: 11/11/2020   End of Session - 11/11/20 0921    Visit Number 12    Number of Visits 17    Date for SLP Re-Evaluation 12/02/20    SLP Start Time 0846    SLP Stop Time  0930    SLP Time Calculation (min) 44 min    Activity Tolerance Patient tolerated treatment well           Past Medical History:  Diagnosis Date  . Hx of migraines   . Stroke [facility])    06/03/20    Past Surgical History:  Procedure Laterality Date  . BUBBLE STUDY  03/13/2020   Procedure: BUBBLE STUDY;  Surgeon: Quintella Reichert, MD;  Location: [facility] ENDOSCOPY;  Service: Cardiovascular;;  . ENDOMETRIAL ABLATION     Uterine/heavy menses  . IR ANGIO INTRA EXTRACRAN SEL COM CAROTID INNOMINATE BILAT MOD SED  06/06/2020  . IR ANGIO VERTEBRAL SEL VERTEBRAL BILAT MOD SED  06/06/2020  . IR CT HEAD LTD  09/25/2020  . IR PERCUTANEOUS ART THROMBECTOMY/INFUSION INTRACRANIAL INC DIAG ANGIO  09/25/2020  . IR US GUIDE VASC ACCESS RIGHT  06/06/2020  . RADIOLOGY WITH ANESTHESIA N/A 09/25/2020   Procedure: RADIOLOGY WITH ANESTHESIA;  Surgeon: Radiologist, Medication, MD;  Location: [facility] OR;  Service: Radiology;  Laterality: N/A;  . SHOULDER SURGERY     Left,/bone spurs  . TEE WITHOUT CARDIOVERSION N/A 03/13/2020   Procedure: TRANSESOPHAGEAL ECHOCARDIOGRAM (TEE);  Surgeon: Quintella Reichert, MD;  Location: [facility] ENDOSCOPY;  Service: Cardiovascular;  Laterality: N/A;    There were no vitals filed for this visit.   Subjective Assessment - 11/11/20 0849    Subjective Pt reports she had to speak to a lot of people this weekend and it went "ok".    Currently in Pain? No/denies                 ADULT SLP TREATMENT -  11/11/20 0850      General Information   Behavior/Cognition Alert;Cooperative;Pleasant mood      Cognitive-Linquistic Treatment   Treatment focused on Aphasia    Skilled Treatment Facilitated use of organization, expressive and receptive language by reading and unscrambling easy-to-moderate sentences.  Pt required modA with word-order. She benefited from reading through all words first and making a "prediction" what the sentence may be trying to convey. Naming objects from description - and writing completed - benefited from semantic cueing.      Assessment / Recommendations / Plan   Plan Continue with current plan of care      Progression Toward Goals   Progression toward goals Progressing toward goals              SLP Short Term Goals - 11/11/20 0926      SLP SHORT TERM GOAL #1   Title Pt will copy written sentences with no errors given no additional cues.    Time 3    Period Weeks    Status On-going      SLP SHORT TERM GOAL #2   Title Pt's husband/family will report use Supported Psychologist, sport and exercise for Aphasia (SCA) strategies during a communication breakdown to increase success in communicative interactions.    Time 3  Period Weeks    Status On-going      SLP SHORT TERM GOAL #3   Title Pt will produce SVO (subject/verb/object) sentences with VnesT treatment given minA.    Time 3    Period Weeks    Status On-going      SLP SHORT TERM GOAL #4   Title Pt will provide 3+ semantic features of a given object using semantic feature anaylsis (SFA).    Time 3    Period Weeks    Status On-going            SLP Long Term Goals - 11/11/20 1610      SLP LONG TERM GOAL #1   Title Pt will be able to communicate basic information in 4+ word sentences.    Time 3    Period Weeks    Status On-going      SLP LONG TERM GOAL #2   Title Pt will be able to write a 4+ word, verbally-dictated sentence with no written errors.    Time 3    Period Weeks    Status On-going             Plan - 11/11/20 9604    Clinical Impression Statement Pt was able to guess objects from description and write down  single words independently. She was able to ID with errors with a verbal cue from therapist and correct those errors. Less errors overall than compared to previous sessions. Continue to speech therapy for patient to be able to communicate wants/needs.    Speech Therapy Frequency 2x / week    Duration 8 weeks    Treatment/Interventions Cognitive reorganization;Multimodal communcation approach;Language facilitation;Compensatory techniques;Internal/external aids;SLP instruction and feedback;Environmental controls;Patient/family education;Functional tasks;Cueing hierarchy    Potential to Achieve Goals Good    Potential Considerations Ability to learn/carryover information    Consulted and Agree with Plan of Care Patient;Family member/caregiver    Family Member Consulted Husband           Patient will benefit from skilled therapeutic intervention in order to improve the following deficits and impairments:   Aphasia    Problem List Patient Active Problem List   Diagnosis Date Noted  . Acute ischemic stroke (HCC) 09/25/2020  . History of ischemic left MCA stroke s/p tPA 06/06/2020  . History of repair of congenital atrial septal defect (ASD) 06/06/2020  . Hyperlipidemia LDL goal <70 06/06/2020  . Received intravenous tissue plasminogen activator (tPA) in emergency department   . Hypothyroid 12/22/2011  . Stress incontinence 12/22/2011  . Family history of kidney disease 12/22/2011  . Routine gynecological examination 12/22/2011  . Routine general medical examination at a health care facility 12/19/2011    Michelene Gardener [place] MS, [place], CBIS  11/11/2020, 9:29 AM  [facility] [address]. [place], [place], [address] Phone: [phone]   Fax:  [phone]   Name: HAZELGRACE BONHAM MRN: [id number] Date of Birth:  [birth year]

## 2020-11-13 ENCOUNTER — Ambulatory Visit: Payer: BC Managed Care – PPO | Admitting: Speech Pathology

## 2020-11-13 ENCOUNTER — Encounter: Payer: Self-pay | Admitting: Speech Pathology

## 2020-11-13 ENCOUNTER — Telehealth: Payer: Self-pay | Admitting: Adult Health

## 2020-11-13 ENCOUNTER — Other Ambulatory Visit: Payer: Self-pay

## 2020-11-13 DIAGNOSIS — R4701 Aphasia: Secondary | ICD-10-CM | POA: Diagnosis not present

## 2020-11-13 NOTE — Therapy (Signed)
[facility] [address] [place], [place], [address] Phone: [phone]   Fax:  [phone]  Speech Language Pathology Treatment  Patient Details  Name: [name] MRN: [id number] Date of Birth: 1971-06-13 Referring Provider (SLP): Beryle Beams MD   Encounter Date: 11/13/2020   End of Session - 11/13/20 0920    Visit Number 13    Number of Visits 17    Date for SLP Re-Evaluation 12/02/20    SLP Start Time 0845    SLP Stop Time  0930    SLP Time Calculation (min) 45 min    Activity Tolerance Patient tolerated treatment well           Past Medical History:  Diagnosis Date  . Hx of migraines   . Stroke [facility])    06/03/20    Past Surgical History:  Procedure Laterality Date  . BUBBLE STUDY  03/13/2020   Procedure: BUBBLE STUDY;  Surgeon: Quintella Reichert, MD;  Location: [facility] ENDOSCOPY;  Service: Cardiovascular;;  . ENDOMETRIAL ABLATION     Uterine/heavy menses  . IR ANGIO INTRA EXTRACRAN SEL COM CAROTID INNOMINATE BILAT MOD SED  06/06/2020  . IR ANGIO VERTEBRAL SEL VERTEBRAL BILAT MOD SED  06/06/2020  . IR CT HEAD LTD  09/25/2020  . IR PERCUTANEOUS ART THROMBECTOMY/INFUSION INTRACRANIAL INC DIAG ANGIO  09/25/2020  . IR US GUIDE VASC ACCESS RIGHT  06/06/2020  . RADIOLOGY WITH ANESTHESIA N/A 09/25/2020   Procedure: RADIOLOGY WITH ANESTHESIA;  Surgeon: Radiologist, Medication, MD;  Location: [facility] OR;  Service: Radiology;  Laterality: N/A;  . SHOULDER SURGERY     Left,/bone spurs  . TEE WITHOUT CARDIOVERSION N/A 03/13/2020   Procedure: TRANSESOPHAGEAL ECHOCARDIOGRAM (TEE);  Surgeon: Quintella Reichert, MD;  Location: [facility] ENDOSCOPY;  Service: Cardiovascular;  Laterality: N/A;    There were no vitals filed for this visit.   Subjective Assessment - 11/13/20 0907    Subjective Pt reported that she is tired this AM.    Currently in Pain? No/denies                 ADULT SLP TREATMENT - 11/13/20 0908      General Information    Behavior/Cognition Alert;Cooperative;Pleasant mood      Treatment Provided   Treatment provided Cognitive-Linquistic      Cognitive-Linquistic Treatment   Treatment focused on Aphasia    Skilled Treatment Facilitated use of expressive language and reading comprehension by having patient read aloud simple "[facility]-" questions and writing the correct answer. Pt was able to understand 90% of questions read. She benefited from reduced visual distraction by using a blue piece of paper to assist in keeping her place as she read word-by-word. She was able to write simple words with minA.      Assessment / Recommendations / Plan   Plan Continue with current plan of care      Progression Toward Goals   Progression toward goals Progressing toward goals              SLP Short Term Goals - 11/13/20 0925      SLP SHORT TERM GOAL #1   Title Pt will copy written sentences with no errors given no additional cues.    Time 3    Period Weeks    Status On-going      SLP SHORT TERM GOAL #2   Title Pt's husband/family will report use Supported Psychologist, sport and exercise for Aphasia (SCA) strategies during a communication breakdown to  increase success in communicative interactions.    Time 3    Period Weeks    Status On-going      SLP SHORT TERM GOAL #3   Title Pt will produce SVO (subject/verb/object) sentences with VnesT treatment given minA.    Time 3    Period Weeks    Status On-going      SLP SHORT TERM GOAL #4   Title Pt will provide 3+ semantic features of a given object using semantic feature anaylsis (SFA).    Time 3    Period Weeks    Status On-going            SLP Long Term Goals - 11/13/20 0926      SLP LONG TERM GOAL #1   Title Pt will be able to communicate basic information in 4+ word sentences.    Time 3    Period Weeks    Status On-going      SLP LONG TERM GOAL #2   Title Pt will be able to write a 4+ word, verbally-dictated sentence with no written errors.    Time 3    Period  Weeks    Status On-going            Plan - 11/13/20 4818    Clinical Impression Statement Pt  was able to ID with errors with a verbal cue from therapist and correct those errors. Less errors overall than compared to previous sessions. Continue to speech therapy for patient to be able to communicate wants/needs.    Speech Therapy Frequency 2x / week    Duration 8 weeks    Treatment/Interventions Cognitive reorganization;Multimodal communcation approach;Language facilitation;Compensatory techniques;Internal/external aids;SLP instruction and feedback;Environmental controls;Patient/family education;Functional tasks;Cueing hierarchy    Potential to Achieve Goals Good    Potential Considerations Ability to learn/carryover information    Consulted and Agree with Plan of Care Patient;Family member/caregiver    Family Member Consulted Husband           Patient will benefit from skilled therapeutic intervention in order to improve the following deficits and impairments:   Aphasia    Problem List Patient Active Problem List   Diagnosis Date Noted  . Acute ischemic stroke (HCC) 09/25/2020  . History of ischemic left MCA stroke s/p tPA 06/06/2020  . History of repair of congenital atrial septal defect (ASD) 06/06/2020  . Hyperlipidemia LDL goal <70 06/06/2020  . Received intravenous tissue plasminogen activator (tPA) in emergency department   . Hypothyroid 12/22/2011  . Stress incontinence 12/22/2011  . Family history of kidney disease 12/22/2011  . Routine gynecological examination 12/22/2011  . Routine general medical examination at a health care facility 12/19/2011    Michelene Gardener [place] MS, [place], CBIS  11/13/2020, 9:26 AM  [facility] [address] [place], [place], [address] Phone: [phone]   Fax:  [phone]   Name: [name] MRN: [id number] Date of Birth: [birth year]

## 2020-11-13 NOTE — Telephone Encounter (Signed)
[place] @ [employer] is asking for a call from RN to know if the PA will be submitted for Nurtec for pt. She is asking to be called at [phone] xt [phone]

## 2020-11-17 MED ORDER — RIMEGEPANT SULFATE 75 MG PO TBDP: ORAL_TABLET | ORAL | 5 refills | Status: DC

## 2020-11-17 NOTE — Telephone Encounter (Signed)
LMVM for [place] with [employer] PA dept that returned call.  Initiated CMM KEY # [id number] for nurtec 75mg  for #8 tabsl G43.9 migraine, unspecified. LMVM for pt to return call for questions about migraine (tried and failed meds).

## 2020-11-17 NOTE — Addendum Note (Signed)
Addended by: Guy Begin on: 11/17/2020 09:42 AM   Modules accepted: Orders

## 2020-11-17 NOTE — Addendum Note (Signed)
Addended by: Raliegh Ip on: 11/17/2020 11:23 AM   Modules accepted: Orders

## 2020-11-18 ENCOUNTER — Ambulatory Visit: Payer: BC Managed Care – PPO | Admitting: Speech Pathology

## 2020-11-18 NOTE — Telephone Encounter (Signed)
Called and LMVM for pt that attempting to do PA and not able to answer dome questions about tried and failed meds.  Call me back. Will send mychart message as well.

## 2020-11-20 ENCOUNTER — Ambulatory Visit: Payer: BC Managed Care – PPO | Admitting: Speech Pathology

## 2020-11-20 ENCOUNTER — Encounter: Payer: Self-pay | Admitting: Speech Pathology

## 2020-11-20 ENCOUNTER — Other Ambulatory Visit: Payer: Self-pay

## 2020-11-20 DIAGNOSIS — R4701 Aphasia: Secondary | ICD-10-CM | POA: Diagnosis not present

## 2020-11-20 NOTE — Therapy (Signed)
[facility] [place] [facility] [address] [place], [place], [address] Phone: [phone]   Fax:  [phone]  Speech Language Pathology Treatment  Patient Details  Name: [name] MRN: [id number] Date of Birth: 12/18/1970 Referring Provider (SLP): Beryle Beams MD   Encounter Date: 11/20/2020   End of Session - 11/20/20 1025    Visit Number 14    Number of Visits 17    Date for SLP Re-Evaluation 12/02/20    SLP Start Time 0930    SLP Stop Time  1018    SLP Time Calculation (min) 48 min    Activity Tolerance Patient tolerated treatment well           Past Medical History:  Diagnosis Date  . Hx of migraines   . Stroke [facility])    06/03/20    Past Surgical History:  Procedure Laterality Date  . BUBBLE STUDY  03/13/2020   Procedure: BUBBLE STUDY;  Surgeon: Quintella Reichert, MD;  Location: [facility] ENDOSCOPY;  Service: Cardiovascular;;  . ENDOMETRIAL ABLATION     Uterine/heavy menses  . IR ANGIO INTRA EXTRACRAN SEL COM CAROTID INNOMINATE BILAT MOD SED  06/06/2020  . IR ANGIO VERTEBRAL SEL VERTEBRAL BILAT MOD SED  06/06/2020  . IR CT HEAD LTD  09/25/2020  . IR PERCUTANEOUS ART THROMBECTOMY/INFUSION INTRACRANIAL INC DIAG ANGIO  09/25/2020  . IR US GUIDE VASC ACCESS RIGHT  06/06/2020  . RADIOLOGY WITH ANESTHESIA N/A 09/25/2020   Procedure: RADIOLOGY WITH ANESTHESIA;  Surgeon: Radiologist, Medication, MD;  Location: [facility] OR;  Service: Radiology;  Laterality: N/A;  . SHOULDER SURGERY     Left,/bone spurs  . TEE WITHOUT CARDIOVERSION N/A 03/13/2020   Procedure: TRANSESOPHAGEAL ECHOCARDIOGRAM (TEE);  Surgeon: Quintella Reichert, MD;  Location: [facility] ENDOSCOPY;  Service: Cardiovascular;  Laterality: N/A;    There were no vitals filed for this visit.          ADULT SLP TREATMENT - 11/20/20 0001      General Information   Behavior/Cognition Alert;Cooperative;Pleasant mood      Treatment Provided   Treatment provided Cognitive-Linquistic       Cognitive-Linquistic Treatment   Treatment focused on Aphasia    Skilled Treatment Trained patient in Phonological Treatment for Writing and assessed patient during reading task for "Multiple Oral Re-Reading" treatment to get a baseline. Pt was able to read 127-word paragraph (at 4th grade reading level) in 6:53. Pt to take home and re-read paragraph 3-5x/day until I see her next for treatment.      Assessment / Recommendations / Plan   Plan Continue with current plan of care      Progression Toward Goals   Progression toward goals Progressing toward goals              SLP Short Term Goals - 11/20/20 1027      SLP SHORT TERM GOAL #1   Title Pt will copy written sentences with no errors given no additional cues.    Time 2    Period Weeks    Status On-going      SLP SHORT TERM GOAL #2   Title Pt's husband/family will report use Supported Psychologist, sport and exercise for Aphasia (SCA) strategies during a communication breakdown to increase success in communicative interactions.    Time 2    Period Weeks    Status On-going      SLP SHORT TERM GOAL #3   Title Pt will produce SVO (subject/verb/object) sentences with VnesT treatment given minA.  Time 2    Period Weeks    Status On-going      SLP SHORT TERM GOAL #4   Title Pt will provide 3+ semantic features of a given object using semantic feature anaylsis (SFA).    Time 2    Period Weeks    Status On-going            SLP Long Term Goals - 11/20/20 1027      SLP LONG TERM GOAL #1   Title Pt will be able to communicate basic information in 4+ word sentences.    Time 2    Period Weeks    Status On-going      SLP LONG TERM GOAL #2   Title Pt will be able to write a 4+ word, verbally-dictated sentence with no written errors.    Time 2    Period Weeks    Status On-going            Plan - 11/20/20 1026    Clinical Impression Statement Pt had difficulty identifying letters based off their given sounds during phonological  treatment. With repetition of "key words" pt was able to use them to identify the appropriate sound and letter. Continue to speech therapy for patient to be able to communicate wants/needs.    Speech Therapy Frequency 2x / week    Duration 8 weeks    Treatment/Interventions Cognitive reorganization;Multimodal communcation approach;Language facilitation;Compensatory techniques;Internal/external aids;SLP instruction and feedback;Environmental controls;Patient/family education;Functional tasks;Cueing hierarchy    Potential to Achieve Goals Good    Potential Considerations Ability to learn/carryover information    Consulted and Agree with Plan of Care Patient;Family member/caregiver    Family Member Consulted Husband           Patient will benefit from skilled therapeutic intervention in order to improve the following deficits and impairments:   Aphasia    Problem List Patient Active Problem List   Diagnosis Date Noted  . Acute ischemic stroke (HCC) 09/25/2020  . History of ischemic left MCA stroke s/p tPA 06/06/2020  . History of repair of congenital atrial septal defect (ASD) 06/06/2020  . Hyperlipidemia LDL goal <70 06/06/2020  . Received intravenous tissue plasminogen activator (tPA) in emergency department   . Hypothyroid 12/22/2011  . Stress incontinence 12/22/2011  . Family history of kidney disease 12/22/2011  . Routine gynecological examination 12/22/2011  . Routine general medical examination at a health care facility 12/19/2011    Michelene Gardener [place] MS, [place], CBIS  11/20/2020, 10:28 AM  [facility] [place] [facility] [address] [place], [place], [address] Phone: [phone]   Fax:  [phone]   Name: [name] MRN: [id number] Date of Birth: [birth year]

## 2020-11-21 NOTE — Progress Notes (Signed)
Carelink Summary Report / Loop Recorder 

## 2020-11-24 ENCOUNTER — Ambulatory Visit: Payer: BC Managed Care – PPO | Admitting: Speech Pathology

## 2020-11-24 ENCOUNTER — Other Ambulatory Visit: Payer: Self-pay

## 2020-11-24 ENCOUNTER — Encounter: Payer: Self-pay | Admitting: Speech Pathology

## 2020-11-24 DIAGNOSIS — R4701 Aphasia: Secondary | ICD-10-CM

## 2020-11-24 NOTE — Therapy (Signed)
[facility] [address] [place], [place], [address] Phone: [phone]   Fax:  [phone]  Speech Language Pathology Treatment  Patient Details  Name: [name] MRN: [id number] Date of Birth: 15-Jan-1971 Referring Provider (SLP): Beryle Beams MD   Encounter Date: 11/24/2020   End of Session - 11/24/20 0928    Visit Number 15    Number of Visits 17    Date for SLP Re-Evaluation 12/02/20    SLP Start Time 0845    SLP Stop Time  0928    SLP Time Calculation (min) 43 min    Activity Tolerance Patient tolerated treatment well           Past Medical History:  Diagnosis Date  . Hx of migraines   . Stroke [facility])    06/03/20    Past Surgical History:  Procedure Laterality Date  . BUBBLE STUDY  03/13/2020   Procedure: BUBBLE STUDY;  Surgeon: Quintella Reichert, MD;  Location: [facility] ENDOSCOPY;  Service: Cardiovascular;;  . ENDOMETRIAL ABLATION     Uterine/heavy menses  . IR ANGIO INTRA EXTRACRAN SEL COM CAROTID INNOMINATE BILAT MOD SED  06/06/2020  . IR ANGIO VERTEBRAL SEL VERTEBRAL BILAT MOD SED  06/06/2020  . IR CT HEAD LTD  09/25/2020  . IR PERCUTANEOUS ART THROMBECTOMY/INFUSION INTRACRANIAL INC DIAG ANGIO  09/25/2020  . IR US GUIDE VASC ACCESS RIGHT  06/06/2020  . RADIOLOGY WITH ANESTHESIA N/A 09/25/2020   Procedure: RADIOLOGY WITH ANESTHESIA;  Surgeon: Radiologist, Medication, MD;  Location: [facility] OR;  Service: Radiology;  Laterality: N/A;  . SHOULDER SURGERY     Left,/bone spurs  . TEE WITHOUT CARDIOVERSION N/A 03/13/2020   Procedure: TRANSESOPHAGEAL ECHOCARDIOGRAM (TEE);  Surgeon: Quintella Reichert, MD;  Location: [facility] ENDOSCOPY;  Service: Cardiovascular;  Laterality: N/A;    There were no vitals filed for this visit.   Subjective Assessment - 11/24/20 0849    Subjective Pt reports she laid by the pool all weekend.    Patient is accompained by: Family member    Currently in Pain? No/denies                 ADULT SLP  TREATMENT - 11/24/20 0903      General Information   Behavior/Cognition Alert;Cooperative;Pleasant mood      Treatment Provided   Treatment provided Cognitive-Linquistic      Cognitive-Linquistic Treatment   Treatment focused on Aphasia    Skilled Treatment Trained patient in Phonological Treatment for Writing and reassessed patient during reading task for "Multiple Oral Re-Reading" treatment to get a baseline for new paragraph. Pt was able to read HEP 127-word paragraph (at 4th grade reading level) in 4:21. Pt to take home and re-read new paragraph 3-5x/day until I see her next for treatment. Today she read the "dermatology" paragraph in 7:27.      Assessment / Recommendations / Plan   Plan Continue with current plan of care      Progression Toward Goals   Progression toward goals Progressing toward goals              SLP Short Term Goals - 11/24/20 0931      SLP SHORT TERM GOAL #1   Title Pt will copy written sentences with no errors given no additional cues.    Time 2    Period Weeks    Status On-going      SLP SHORT TERM GOAL #2   Title Pt's husband/family will report  use Supported Psychologist, sport and exercise for Aphasia (SCA) strategies during a communication breakdown to increase success in communicative interactions.    Time 2    Period Weeks    Status On-going      SLP SHORT TERM GOAL #3   Title Pt will produce SVO (subject/verb/object) sentences with VnesT treatment given minA.    Time 2    Period Weeks    Status On-going      SLP SHORT TERM GOAL #4   Title Pt will provide 3+ semantic features of a given object using semantic feature anaylsis (SFA).    Time 2    Period Weeks    Status On-going            SLP Long Term Goals - 11/24/20 0931      SLP LONG TERM GOAL #1   Title Pt will be able to communicate basic information in 4+ word sentences.    Time 2    Period Weeks    Status On-going      SLP LONG TERM GOAL #2   Title Pt will be able to write a 4+ word,  verbally-dictated sentence with no written errors.    Time 2    Period Weeks    Status On-going            Plan - 11/24/20 0931    Clinical Impression Statement Pt had difficulty identifying letters based off their given sounds during phonological treatment. With repetition of "key words" pt was able to use them to identify the appropriate sound and letter. Continue to speech therapy for patient to be able to communicate wants/needs.    Speech Therapy Frequency 2x / week    Duration 8 weeks    Treatment/Interventions Cognitive reorganization;Multimodal communcation approach;Language facilitation;Compensatory techniques;Internal/external aids;SLP instruction and feedback;Environmental controls;Patient/family education;Functional tasks;Cueing hierarchy    Potential to Achieve Goals Good    Potential Considerations Ability to learn/carryover information    Consulted and Agree with Plan of Care Patient;Family member/caregiver    Family Member Consulted Husband           Patient will benefit from skilled therapeutic intervention in order to improve the following deficits and impairments:   Aphasia    Problem List Patient Active Problem List   Diagnosis Date Noted  . Acute ischemic stroke (HCC) 09/25/2020  . History of ischemic left MCA stroke s/p tPA 06/06/2020  . History of repair of congenital atrial septal defect (ASD) 06/06/2020  . Hyperlipidemia LDL goal <70 06/06/2020  . Received intravenous tissue plasminogen activator (tPA) in emergency department   . Hypothyroid 12/22/2011  . Stress incontinence 12/22/2011  . Family history of kidney disease 12/22/2011  . Routine gynecological examination 12/22/2011  . Routine general medical examination at a health care facility 12/19/2011    Michelene Gardener [place] MS, [place], CBIS  11/24/2020, 11:01 AM  [facility] [address] [place], [place], [address] Phone: [phone]   Fax:   [phone]   Name: [name] MRN: [id number] Date of Birth: [birth year]

## 2020-11-26 ENCOUNTER — Encounter: Payer: Self-pay | Admitting: Speech Pathology

## 2020-11-26 ENCOUNTER — Other Ambulatory Visit: Payer: Self-pay

## 2020-11-26 ENCOUNTER — Ambulatory Visit: Payer: BC Managed Care – PPO | Admitting: Speech Pathology

## 2020-11-26 DIAGNOSIS — R4701 Aphasia: Secondary | ICD-10-CM

## 2020-11-26 NOTE — Therapy (Signed)
[facility] [address] [place], [place], [address] Phone: [phone]   Fax:  [phone]  Speech Language Pathology Treatment  Patient Details  Name: [name] MRN: [id number] Date of Birth: 1970-10-26 Referring Provider (SLP): Beryle Beams MD   Encounter Date: 11/26/2020   End of Session - 11/26/20 1138    Visit Number 16    Number of Visits 17    Date for SLP Re-Evaluation 12/02/20    SLP Start Time 0845    SLP Stop Time  0929    SLP Time Calculation (min) 44 min    Activity Tolerance Patient tolerated treatment well           Past Medical History:  Diagnosis Date  . Hx of migraines   . Stroke [facility])    06/03/20    Past Surgical History:  Procedure Laterality Date  . BUBBLE STUDY  03/13/2020   Procedure: BUBBLE STUDY;  Surgeon: Quintella Reichert, MD;  Location: [facility] ENDOSCOPY;  Service: Cardiovascular;;  . ENDOMETRIAL ABLATION     Uterine/heavy menses  . IR ANGIO INTRA EXTRACRAN SEL COM CAROTID INNOMINATE BILAT MOD SED  06/06/2020  . IR ANGIO VERTEBRAL SEL VERTEBRAL BILAT MOD SED  06/06/2020  . IR CT HEAD LTD  09/25/2020  . IR PERCUTANEOUS ART THROMBECTOMY/INFUSION INTRACRANIAL INC DIAG ANGIO  09/25/2020  . IR US GUIDE VASC ACCESS RIGHT  06/06/2020  . RADIOLOGY WITH ANESTHESIA N/A 09/25/2020   Procedure: RADIOLOGY WITH ANESTHESIA;  Surgeon: Radiologist, Medication, MD;  Location: [facility] OR;  Service: Radiology;  Laterality: N/A;  . SHOULDER SURGERY     Left,/bone spurs  . TEE WITHOUT CARDIOVERSION N/A 03/13/2020   Procedure: TRANSESOPHAGEAL ECHOCARDIOGRAM (TEE);  Surgeon: Quintella Reichert, MD;  Location: [facility] ENDOSCOPY;  Service: Cardiovascular;  Laterality: N/A;    There were no vitals filed for this visit.   Subjective Assessment - 11/26/20 0933    Subjective Pt reports she completed her HEP.    Currently in Pain? No/denies                 ADULT SLP TREATMENT - 11/26/20 1004      General Information    Behavior/Cognition Alert;Cooperative;Pleasant mood      Treatment Provided   Treatment provided Cognitive-Linquistic      Cognitive-Linquistic Treatment   Treatment focused on Aphasia    Skilled Treatment Trained patient in Phonological Treatment for Writing and reassessed patient during reading task for "Multiple Oral Re-Reading" treatment to get a baseline for new paragraph. Pt was able to read HEP word paragraph (at 4th grade reading level) today in 6:00, decreasing her previous time by 1:27.  Generated sentences with minA.      Assessment / Recommendations / Plan   Plan Continue with current plan of care      Progression Toward Goals   Progression toward goals Progressing toward goals              SLP Short Term Goals - 11/26/20 1139      SLP SHORT TERM GOAL #1   Title Pt will copy written sentences with no errors given no additional cues.    Time 1    Period Weeks    Status On-going      SLP SHORT TERM GOAL #2   Title Pt's husband/family will report use Supported Psychologist, sport and exercise for Aphasia (SCA) strategies during a communication breakdown to increase success in communicative interactions.    Time 1  Period Weeks    Status On-going      SLP SHORT TERM GOAL #3   Title Pt will produce SVO (subject/verb/object) sentences with VnesT treatment given minA.    Time 1    Period Weeks    Status On-going      SLP SHORT TERM GOAL #4   Title Pt will provide 3+ semantic features of a given object using semantic feature anaylsis (SFA).    Time 1    Period Weeks    Status On-going            SLP Long Term Goals - 11/26/20 1140      SLP LONG TERM GOAL #1   Title Pt will be able to communicate basic information in 4+ word sentences.    Time 1    Period Weeks    Status On-going      SLP LONG TERM GOAL #2   Title Pt will be able to write a 4+ word, verbally-dictated sentence with no written errors.    Time 1    Period Weeks    Status On-going            Plan -  11/26/20 1139    Clinical Impression Statement Pt had difficulty identifying letters based off their given sounds during phonological treatment. With repetition of "key words" pt was able to use them to identify the appropriate sound and letter. Oral re-reading tx has been succesful in increasing fluency/ease of reading aloud. Continue to speech therapy for patient to be able to communicate wants/needs.    Speech Therapy Frequency 2x / week    Duration 8 weeks    Treatment/Interventions Cognitive reorganization;Multimodal communcation approach;Language facilitation;Compensatory techniques;Internal/external aids;SLP instruction and feedback;Environmental controls;Patient/family education;Functional tasks;Cueing hierarchy    Potential to Achieve Goals Good    Potential Considerations Ability to learn/carryover information    Consulted and Agree with Plan of Care Patient;Family member/caregiver    Family Member Consulted Husband           Patient will benefit from skilled therapeutic intervention in order to improve the following deficits and impairments:   Aphasia    Problem List Patient Active Problem List   Diagnosis Date Noted  . Acute ischemic stroke (HCC) 09/25/2020  . History of ischemic left MCA stroke s/p tPA 06/06/2020  . History of repair of congenital atrial septal defect (ASD) 06/06/2020  . Hyperlipidemia LDL goal <70 06/06/2020  . Received intravenous tissue plasminogen activator (tPA) in emergency department   . Hypothyroid 12/22/2011  . Stress incontinence 12/22/2011  . Family history of kidney disease 12/22/2011  . Routine gynecological examination 12/22/2011  . Routine general medical examination at a health care facility 12/19/2011    Jamie Gardener [place] MS, [place], CBIS  11/26/2020, 11:40 AM  [facility] [address] [place], [place], [address] Phone: [phone]   Fax:  [phone]   Name: [name] MRN: [id number] Date of Birth: [birth year]

## 2020-12-02 ENCOUNTER — Ambulatory Visit: Payer: BC Managed Care – PPO | Admitting: Speech Pathology

## 2020-12-02 ENCOUNTER — Encounter: Payer: Self-pay | Admitting: Speech Pathology

## 2020-12-02 ENCOUNTER — Other Ambulatory Visit: Payer: Self-pay

## 2020-12-02 DIAGNOSIS — R4701 Aphasia: Secondary | ICD-10-CM | POA: Diagnosis not present

## 2020-12-02 NOTE — Therapy (Signed)
[place]. [place], [place], [address] Phone: [phone]   Fax:  [phone]  Speech Language Pathology Treatment  Patient Details  Name: [name] MRN: [id number] Date of Birth: 1971/06/07 Referring Provider (SLP): Phillips Odor MD   Encounter Date: 12/02/2020   End of Session - 12/02/20 0928    Visit Number 17    Date for SLP Re-Evaluation 02/01/21    Authorization Time Period 5/31/-02/01/21    Authorization - Number of Visits 62    SLP Start Time 0845    SLP Stop Time  0930    SLP Time Calculation (min) 45 min    Activity Tolerance Patient tolerated treatment well           Past Medical History:  Diagnosis Date  . Hx of migraines   . Stroke [facility])    06/03/20    Past Surgical History:  Procedure Laterality Date  . BUBBLE STUDY  03/13/2020   Procedure: BUBBLE STUDY;  Surgeon: Sueanne Margarita, MD;  Location: [facility] ENDOSCOPY;  Service: Cardiovascular;;  . ENDOMETRIAL ABLATION     Uterine/heavy menses  . IR ANGIO INTRA EXTRACRAN SEL COM CAROTID INNOMINATE BILAT MOD SED  06/06/2020  . IR ANGIO VERTEBRAL SEL VERTEBRAL BILAT MOD SED  06/06/2020  . IR CT HEAD LTD  09/25/2020  . IR PERCUTANEOUS ART THROMBECTOMY/INFUSION INTRACRANIAL INC DIAG ANGIO  09/25/2020  . IR US GUIDE VASC ACCESS RIGHT  06/06/2020  . RADIOLOGY WITH ANESTHESIA N/A 09/25/2020   Procedure: RADIOLOGY WITH ANESTHESIA;  Surgeon: Radiologist, Medication, MD;  Location: [facility];  Service: Radiology;  Laterality: N/A;  . SHOULDER SURGERY     Left,/bone spurs  . TEE WITHOUT CARDIOVERSION N/A 03/13/2020   Procedure: TRANSESOPHAGEAL ECHOCARDIOGRAM (TEE);  Surgeon: Sueanne Margarita, MD;  Location: [facility] ENDOSCOPY;  Service: Cardiovascular;  Laterality: N/A;    There were no vitals filed for this visit.   Subjective Assessment - 12/02/20 0849    Subjective Pt shared about her MDW.    Currently in Pain? No/denies                 ADULT SLP  TREATMENT - 12/02/20 0857      General Information   Behavior/Cognition Alert;Cooperative;Pleasant mood      Treatment Provided   Treatment provided Cognitive-Linquistic      Cognitive-Linquistic Treatment   Treatment focused on Aphasia    Skilled Treatment Trained patient in Phonological Treatment for Writing with single words. Pt required SLP to provide verbal and visual cues; reassessed patient during reading task for "Multiple Oral Re-Reading" treatment to get a baseline for new paragraph. Pt was able to read HEP word paragraph (at 4th grade reading level) today in 3:15 decreasing her previous time by 1:00.      Assessment / Recommendations / Plan   Plan Continue with current plan of care      Progression Toward Goals   Progression toward goals Progressing toward goals              SLP Short Term Goals - 12/02/20 0932      SLP SHORT TERM GOAL #1   Title Pt will copy written sentences with no errors given no additional cues.    Time 1    Period Weeks    Status Achieved      SLP SHORT TERM GOAL #2   Title Pt's husband/family will report use Supported Sales executive for Aphasia (SCA) strategies during a communication  breakdown to increase success in communicative interactions.    Time 1    Period Weeks    Status Partially Met      SLP SHORT TERM GOAL #3   Title Pt will produce SVO (subject/verb/object) sentences with VnesT treatment given minA.    Time 1    Period Weeks    Status Achieved      SLP SHORT TERM GOAL #4   Title Pt will provide 3+ semantic features of a given object using semantic feature anaylsis (SFA).    Time 1    Period Weeks    Status Achieved            SLP Long Term Goals - 12/02/20 3559      SLP LONG TERM GOAL #1   Title Pt will be able to communicate basic information in 4+ word sentences.    Time 1    Period Weeks    Status Achieved      SLP LONG TERM GOAL #2   Title Pt will be able to write a 4+ word, verbally-dictated sentence with  no written errors.    Time 1    Period Weeks    Status Not Met            Plan - 12/02/20 0931    Clinical Impression Statement Pt had difficulty identifying letters based off their given sounds during phonological treatment. With repetition of "key words" pt was able to use them to identify the appropriate sound and letter. Oral re-reading tx has been succesful in increasing fluency/ease of reading aloud. Continue to speech therapy for patient to be able to communicate wants/needs.    Speech Therapy Frequency 2x / week    Duration 8 weeks    Treatment/Interventions Cognitive reorganization;Multimodal communcation approach;Language facilitation;Compensatory techniques;Internal/external aids;SLP instruction and feedback;Environmental controls;Patient/family education;Functional tasks;Cueing hierarchy    Potential to Achieve Goals Good    Potential Considerations Ability to learn/carryover information    Consulted and Agree with Plan of Care Patient;Family member/caregiver    Family Member Consulted Husband           Patient will benefit from skilled therapeutic intervention in order to improve the following deficits and impairments:   Aphasia    Problem List Patient Active Problem List   Diagnosis Date Noted  . Acute ischemic stroke ([place]) 09/25/2020  . History of ischemic left MCA stroke s/p tPA 06/06/2020  . History of repair of congenital atrial septal defect (ASD) 06/06/2020  . Hyperlipidemia LDL goal <70 06/06/2020  . Received intravenous tissue plasminogen activator (tPA) in emergency department   . Hypothyroid 12/22/2011  . Stress incontinence 12/22/2011  . Family history of kidney disease 12/22/2011  . Routine gynecological examination 12/22/2011  . Routine general medical examination at a health care facility 12/19/2011    [place], [place], [place]  12/02/2020, 9:39 AM  [place]. [place], [place], [address] Phone: [phone]   Fax:  [phone]   Name: [name] MRN: [id number] Date of Birth: [birth year]

## 2020-12-04 ENCOUNTER — Ambulatory Visit (INDEPENDENT_AMBULATORY_CARE_PROVIDER_SITE_OTHER): Payer: BC Managed Care – PPO

## 2020-12-04 DIAGNOSIS — I639 Cerebral infarction, unspecified: Secondary | ICD-10-CM

## 2020-12-05 DIAGNOSIS — Q211 Atrial septal defect: Secondary | ICD-10-CM | POA: Diagnosis not present

## 2020-12-06 LAB — CUP PACEART REMOTE DEVICE CHECK
Date Time Interrogation Session: 20220603084734
Implantable Pulse Generator Implant Date: 20211116

## 2020-12-08 ENCOUNTER — Encounter: Payer: Self-pay | Admitting: Family Medicine

## 2020-12-08 DIAGNOSIS — Z1231 Encounter for screening mammogram for malignant neoplasm of breast: Secondary | ICD-10-CM

## 2020-12-09 ENCOUNTER — Encounter: Payer: Self-pay | Admitting: Speech Pathology

## 2020-12-09 ENCOUNTER — Other Ambulatory Visit: Payer: Self-pay

## 2020-12-09 ENCOUNTER — Ambulatory Visit: Payer: BC Managed Care – PPO | Attending: Neurology | Admitting: Speech Pathology

## 2020-12-09 DIAGNOSIS — Z1231 Encounter for screening mammogram for malignant neoplasm of breast: Secondary | ICD-10-CM | POA: Insufficient documentation

## 2020-12-09 DIAGNOSIS — R4701 Aphasia: Secondary | ICD-10-CM | POA: Insufficient documentation

## 2020-12-09 NOTE — Therapy (Signed)
[name] [address] [place] [place], [place], [address] Phone: [phone]   Fax:  [phone]  Speech Language Pathology Treatment  Patient Details  Name: [name] MRN: [id number] Date of Birth: 1970/09/08 Referring Provider (SLP): Beryle Beams MD   Encounter Date: 12/09/2020   End of Session - 12/09/20 0934    Visit Number 18    Date for SLP Re-Evaluation 02/01/21    Authorization Time Period 5/31/-02/01/21    Authorization - Visit Number 1    Authorization - Number of Visits 17    SLP Start Time 0930    SLP Stop Time  1014    SLP Time Calculation (min) 44 min    Activity Tolerance Patient tolerated treatment well           Past Medical History:  Diagnosis Date  . Hx of migraines   . Stroke [facility])    06/03/20    Past Surgical History:  Procedure Laterality Date  . BUBBLE STUDY  03/13/2020   Procedure: BUBBLE STUDY;  Surgeon: Quintella Reichert, MD;  Location: [facility] ENDOSCOPY;  Service: Cardiovascular;;  . ENDOMETRIAL ABLATION     Uterine/heavy menses  . IR ANGIO INTRA EXTRACRAN SEL COM CAROTID INNOMINATE BILAT MOD SED  06/06/2020  . IR ANGIO VERTEBRAL SEL VERTEBRAL BILAT MOD SED  06/06/2020  . IR CT HEAD LTD  09/25/2020  . IR PERCUTANEOUS ART THROMBECTOMY/INFUSION INTRACRANIAL INC DIAG ANGIO  09/25/2020  . IR US GUIDE VASC ACCESS RIGHT  06/06/2020  . RADIOLOGY WITH ANESTHESIA N/A 09/25/2020   Procedure: RADIOLOGY WITH ANESTHESIA;  Surgeon: Radiologist, Medication, MD;  [facility]: [facility] OR;  Service: Radiology;  Laterality: N/A;  . SHOULDER SURGERY     Left,/bone spurs  . TEE WITHOUT CARDIOVERSION N/A 03/13/2020   Procedure: TRANSESOPHAGEAL ECHOCARDIOGRAM (TEE);  Surgeon: Quintella Reichert, MD;  Location: [facility] ENDOSCOPY;  Service: Cardiovascular;  Laterality: N/A;    There were no vitals filed for this visit.   Subjective Assessment - 12/09/20 0933    Subjective Pt was able to share about her weekend clearly and concisely.     Currently in Pain? No/denies                     SLP Short Term Goals - 12/09/20 0947      SLP SHORT TERM GOAL #1   Title Pt will generate a written sentence with <2 spelling errors independently by using Phonomotor treatment.    Time 4    Period Weeks    Status New      SLP SHORT TERM GOAL #2   Title Patient will report use Supported Psychologist, sport and exercise for Aphasia (SCA) strategies during a communication breakdown to increase success in communicative interactions with her family.    Time 4    Period Weeks    Status Revised      SLP SHORT TERM GOAL #3   Title Pt will produce SVO (subject/verb/object) sentences with VnesT treatment independently.    Time 4    Period Weeks    Status New      SLP SHORT TERM GOAL #4   Title Pt will decrease time on passage by >2 min by participating in Multiple Oral Re-Reading training at home.    Time 4    Period Weeks    Status New            SLP Long Term Goals - 12/09/20 0957      SLP LONG  TERM GOAL #1   Title Pt will increase overall reading fluency by using Multiple Oral Re-reading treatment.    Time 8    Period Weeks    Status New      SLP LONG TERM GOAL #2   Title Pt will be able to write a 4+ word, verbally-dictated sentence and correct errors if necessary.    Time 8    Period Weeks    Status Revised      SLP LONG TERM GOAL #3   Title Pt will decrease phonemic paraphasias in speech by using Phonomotor treatment.    Time 8    Period Weeks    Status New            Plan - 12/09/20 2725    Clinical Impression Statement Trained pt in VNeST to increase expressive language. Required minA to complete. Most difficulty noted with generating her own sentences. Continue to speech therapy for patient to be able to communicate wants/needs.    Speech Therapy Frequency 2x / week    Duration 8 weeks    Treatment/Interventions Cognitive reorganization;Multimodal communcation approach;Language facilitation;Compensatory  techniques;Internal/external aids;SLP instruction and feedback;Environmental controls;Patient/family education;Functional tasks;Cueing hierarchy    Potential to Achieve Goals Good    Potential Considerations Ability to learn/carryover information    Consulted and Agree with Plan of Care Patient;Family member/caregiver    Family Member Consulted Husband           Patient will benefit from skilled therapeutic intervention in order to improve the following deficits and impairments:   Aphasia    Problem List Patient Active Problem List   Diagnosis Date Noted  . Acute ischemic stroke (HCC) 09/25/2020  . History of ischemic left MCA stroke s/p tPA 06/06/2020  . History of repair of congenital atrial septal defect (ASD) 06/06/2020  . Hyperlipidemia LDL goal <70 06/06/2020  . Received intravenous tissue plasminogen activator (tPA) in emergency department   . Hypothyroid 12/22/2011  . Stress incontinence 12/22/2011  . Family history of kidney disease 12/22/2011  . Routine gynecological examination 12/22/2011  . Routine general medical examination at a health care facility 12/19/2011    [name], [place], CBIS  12/09/2020, 10:07 AM  [facility] [place] [facility] [address] [place], [place], [address] Phone: [phone]   Fax:  [phone]   Name: [name] MRN: [id number] Date of Birth: 08/20/1970

## 2020-12-09 NOTE — Telephone Encounter (Signed)
I ordered her mammogram at the breast center so please give her the phone # to schedule that   Please schedule appt with me to discuss cancer screening and we will do gyn exam with pap that day  Thanks

## 2020-12-10 NOTE — Telephone Encounter (Signed)
Called pt and phone # provider for the [facility] and f/u with PCP scheduled

## 2020-12-11 ENCOUNTER — Other Ambulatory Visit: Payer: Self-pay

## 2020-12-11 ENCOUNTER — Encounter: Payer: Self-pay | Admitting: Speech Pathology

## 2020-12-11 ENCOUNTER — Ambulatory Visit: Payer: BC Managed Care – PPO | Admitting: Speech Pathology

## 2020-12-11 DIAGNOSIS — R4701 Aphasia: Secondary | ICD-10-CM

## 2020-12-11 NOTE — Therapy (Signed)
[facility] [address] [place], [place], [address] Phone: [phone]   Fax:  [phone]  Speech Language Pathology Treatment  Patient Details  Name: [name] MRN: [id number] Date of Birth: [birth year] Referring Provider (SLP): Beryle Beams MD   Encounter Date: 12/11/2020   End of Session - 12/11/20 0937     Visit Number 19    Date for SLP Re-Evaluation 02/01/21    Authorization Time Period 5/31/-02/01/21    Authorization - Visit Number 1    Authorization - Number of Visits 17    Activity Tolerance Patient tolerated treatment well             Past Medical History:  Diagnosis Date   Hx of migraines    Stroke (HCC)    06/03/20    Past Surgical History:  Procedure Laterality Date   BUBBLE STUDY  03/13/2020   Procedure: BUBBLE STUDY;  Surgeon: Quintella Reichert, MD;  [facility];  Service: Cardiovascular;;   ENDOMETRIAL ABLATION     Uterine/heavy menses   IR ANGIO INTRA EXTRACRAN SEL COM CAROTID INNOMINATE BILAT MOD SED  06/06/2020   IR ANGIO VERTEBRAL SEL VERTEBRAL BILAT MOD SED  06/06/2020   IR CT HEAD LTD  09/25/2020   IR PERCUTANEOUS ART THROMBECTOMY/INFUSION INTRACRANIAL INC DIAG ANGIO  09/25/2020   IR US GUIDE VASC ACCESS RIGHT  06/06/2020   RADIOLOGY WITH ANESTHESIA N/A 09/25/2020   Procedure: RADIOLOGY WITH ANESTHESIA;  Surgeon: Radiologist, Medication, MD;  [facility]: [facility] OR;  Service: Radiology;  Laterality: N/A;   SHOULDER SURGERY     Left,/bone spurs   TEE WITHOUT CARDIOVERSION N/A 03/13/2020   Procedure: TRANSESOPHAGEAL ECHOCARDIOGRAM (TEE);  Surgeon: Quintella Reichert, MD;  Location: [facility] ENDOSCOPY;  Service: Cardiovascular;  Laterality: N/A;    There were no vitals filed for this visit.   Subjective Assessment - 12/11/20 0933     Subjective Pt is well this AM.    Currently in Pain? No/denies                       SLP Short Term Goals - 12/11/20 1108       SLP SHORT TERM GOAL  #1   Title Pt will generate a written sentence without (<5words) spelling errors independently by using Phonological Treatment for Writing.    Time 4    Period Weeks    Status On-going      SLP SHORT TERM GOAL #2   Title Patient will report use Supported Psychologist, sport and exercise for Aphasia (SCA) strategies during a communication breakdown to increase success in communicative interactions with her family.    Time 4    Period Weeks    Status On-going      SLP SHORT TERM GOAL #3   Title Pt will produce SVO (subject/verb/object) sentences verbally with VnesT treatment independently.    Time 4    Period Weeks    Status On-going      SLP SHORT TERM GOAL #4   Title Pt will decrease time on passage by >2 min by participating in Multiple Oral Re-Reading training at home.    Time 4    Period Weeks    Status On-going              SLP Long Term Goals - 12/11/20 1109       SLP LONG TERM GOAL #1   Title Pt will increase overall reading fluency by using Multiple Oral Re-reading  treatment.    Time 8    Period Weeks    Status On-going      SLP LONG TERM GOAL #2   Title Pt will be able to write a 4+ word, verbally-dictated sentence and correct errors if necessary.    Time 8    Period Weeks    Status On-going      SLP LONG TERM GOAL #3   Title Pt will decrease phonemic paraphasias in speech by using Phonomotor treatment.    Time 8    Period Weeks    Status On-going              Plan - 12/11/20 1107     Clinical Impression Statement Trained pt using phonomotor treatment. Pt required modA to pair sounds with their corresponding letter. Continue to speech therapy for patient to be able to communicate wants/needs.    Speech Therapy Frequency 2x / week    Duration 8 weeks    Treatment/Interventions Cognitive reorganization;Multimodal communcation approach;Language facilitation;Compensatory techniques;Internal/external aids;SLP instruction and feedback;Environmental controls;Patient/family  education;Functional tasks;Cueing hierarchy    Potential to Achieve Goals Good    Potential Considerations Ability to learn/carryover information    Consulted and Agree with Plan of Care Patient;Family member/caregiver    Family Member Consulted Husband             Patient will benefit from skilled therapeutic intervention in order to improve the following deficits and impairments:   Aphasia    Problem List Patient Active Problem List   Diagnosis Date Noted   Encounter for screening mammogram for breast cancer 12/09/2020   Acute ischemic stroke (HCC) 09/25/2020   History of ischemic left MCA stroke s/p tPA 06/06/2020   History of repair of congenital atrial septal defect (ASD) 06/06/2020   Hyperlipidemia LDL goal <70 06/06/2020   Received intravenous tissue plasminogen activator (tPA) in emergency department    Hypothyroid 12/22/2011   Stress incontinence 12/22/2011   Family history of kidney disease 12/22/2011   Routine gynecological examination 12/22/2011   Routine general medical examination at a health care facility 12/19/2011    Michelene Gardener [place] MS, [place], CBIS  12/11/2020, 11:09 AM  [facility] [address] [place], [place], [address] Phone: [phone]   Fax:  [phone]   Name: [name] MRN: [id number] Date of Birth: 1970-12-26

## 2020-12-16 ENCOUNTER — Ambulatory Visit: Payer: BC Managed Care – PPO | Admitting: Speech Pathology

## 2020-12-17 ENCOUNTER — Other Ambulatory Visit: Payer: Self-pay

## 2020-12-17 ENCOUNTER — Other Ambulatory Visit (HOSPITAL_COMMUNITY)
Admission: RE | Admit: 2020-12-17 | Discharge: 2020-12-17 | Disposition: A | Discharge status: Home / Self Care | Payer: BC Managed Care – PPO | Source: Ambulatory Visit | Attending: Family Medicine | Admitting: Family Medicine

## 2020-12-17 ENCOUNTER — Encounter: Payer: Self-pay | Admitting: Family Medicine

## 2020-12-17 ENCOUNTER — Ambulatory Visit (INDEPENDENT_AMBULATORY_CARE_PROVIDER_SITE_OTHER): Payer: BC Managed Care – PPO | Admitting: Family Medicine

## 2020-12-17 VITALS — BP 128/68 | HR 68 | Temp 97.0°F | Ht 62.5 in | Wt 182.5 lb

## 2020-12-17 DIAGNOSIS — Z01419 Encounter for gynecological examination (general) (routine) without abnormal findings: Secondary | ICD-10-CM | POA: Insufficient documentation

## 2020-12-17 DIAGNOSIS — Z129 Encounter for screening for malignant neoplasm, site unspecified: Secondary | ICD-10-CM

## 2020-12-17 DIAGNOSIS — Z1211 Encounter for screening for malignant neoplasm of colon: Secondary | ICD-10-CM | POA: Diagnosis not present

## 2020-12-17 DIAGNOSIS — Z8673 Personal history of transient ischemic attack (TIA), and cerebral infarction without residual deficits: Secondary | ICD-10-CM | POA: Diagnosis not present

## 2020-12-17 DIAGNOSIS — Z1231 Encounter for screening mammogram for malignant neoplasm of breast: Secondary | ICD-10-CM

## 2020-12-17 NOTE — Progress Notes (Signed)
Subjective:    Patient ID: [name], female    DOB: [birth year], 50 y.o.   MRN: [id number]  This visit occurred during the SARS-CoV-2 public health emergency.  Safety protocols were in place, including screening questions prior to the visit, additional usage of staff PPE, and extensive cleaning of exam room while observing appropriate contact time as indicated for disinfecting solutions.   HPI Pt presents to discuss cancer screening   Wt Readings from Last 3 Encounters:  12/17/20 182 lb 8 oz (82.8 kg)  10/30/20 183 lb (83 kg)  09/25/20 195 lb 1.7 oz (88.5 kg)   32.85 kg/m  H/o multiple strokes w/o etiology (extensive w/u) Sees neuro at [facility] currently Dr Claris Gladden  Takes plavix and lipitor   Last pap was 6/13 Normal  Menses-has had ablation   Colon cancer screening -has not had  Would like to avoid colonoscopy due to unpredictable strokes  Agreeable to start with ifob kit   Mammogram  Self breast exam   Past smoking for 5 y (less than 1ppd) and quit in 1998  Family hx -pt plans to compile list  Neurologist adv we do a full screen to r/o underlying malignancy that could cause an acquired coagulopathy  This incl pap and mammogram Mentioned full body PET scan vs CT of chest and abd and pelvis  She has seen Dr Lorenso Courier in 2/22 hematology-no clotting disorder   Her cardiologist - will be at [facility] (upcoming first visit)  ? About PFO  Mother had gyn cancer  No breast cancer   Patient Active Problem List   Diagnosis Date Noted   Colon cancer screening 12/17/2020   Cancer screening 12/17/2020   Encounter for screening mammogram for breast cancer 12/09/2020   Acute ischemic stroke ([place]) 09/25/2020   History of ischemic left MCA stroke s/p tPA 06/06/2020   History of repair of congenital atrial septal defect (ASD) 06/06/2020   Hyperlipidemia LDL goal <70 06/06/2020   Received intravenous tissue plasminogen activator (tPA) in emergency department    Hypothyroid  12/22/2011   Stress incontinence 12/22/2011   Family history of kidney disease 12/22/2011   Encounter for routine gynecological examination 12/22/2011   Routine general medical examination at a health care facility 12/19/2011   Past Medical History:  Diagnosis Date   Hx of migraines    Stroke ([place])    06/03/20   Past Surgical History:  Procedure Laterality Date   BUBBLE STUDY  03/13/2020   Procedure: BUBBLE STUDY;  Surgeon: Sueanne Margarita, MD;  Location: [facility];  Service: Cardiovascular;;   ENDOMETRIAL ABLATION     Uterine/heavy menses   IR ANGIO INTRA EXTRACRAN SEL COM CAROTID INNOMINATE BILAT MOD SED  06/06/2020   IR ANGIO VERTEBRAL SEL VERTEBRAL BILAT MOD SED  06/06/2020   IR CT HEAD LTD  09/25/2020   IR PERCUTANEOUS ART THROMBECTOMY/INFUSION INTRACRANIAL INC DIAG ANGIO  09/25/2020   IR US GUIDE VASC ACCESS RIGHT  06/06/2020   RADIOLOGY WITH ANESTHESIA N/A 09/25/2020   Procedure: RADIOLOGY WITH ANESTHESIA;  Surgeon: Radiologist, Medication, MD;  Location: [facility];  Service: Radiology;  Laterality: N/A;   SHOULDER SURGERY     Left,/bone spurs   TEE WITHOUT CARDIOVERSION N/A 03/13/2020   Procedure: TRANSESOPHAGEAL ECHOCARDIOGRAM (TEE);  Surgeon: Sueanne Margarita, MD;  Location: [facility] ENDOSCOPY;  Service: Cardiovascular;  Laterality: N/A;   Social History   Tobacco Use   Smoking status: Former    Pack years: 0.00   Smokeless tobacco: Never  Substance Use Topics   Alcohol use: Yes    Alcohol/week: 1.0 standard drink    Types: 1 Cans of beer per week    Comment: states 1 beer every other day or so    Drug use: Never   Family History  Problem Relation Age of Onset   Hypertension Mother    Hypothyroidism Mother    High Cholesterol Mother    Atrial fibrillation Mother    COPD Father    Emphysema Father    Hypothyroidism Sister    Allergies  Allergen Reactions   Bee Venom Swelling   Current Outpatient Medications on File Prior to Visit  Medication Sig Dispense Refill    atorvastatin (LIPITOR) 20 MG tablet Take 1 tablet (20 mg total) by mouth daily. 90 tablet 3   clopidogrel (PLAVIX) 75 MG tablet Take 1 tablet (75 mg total) by mouth daily. 90 tablet 3   Rimegepant Sulfate 75 MG TBDP Take one tablet daily for onset migraine. (max 1 tab / 24 hours) 8 tablet 5   No current facility-administered medications on file prior to visit.     Review of Systems  Constitutional:  Negative for activity change, appetite change, fatigue, fever and unexpected weight change.  HENT:  Negative for congestion, ear pain, rhinorrhea, sinus pressure and sore throat.   Eyes:  Negative for pain, redness and visual disturbance.  Respiratory:  Negative for cough, shortness of breath and wheezing.   Cardiovascular:  Negative for chest pain and palpitations.  Gastrointestinal:  Negative for abdominal pain, blood in stool, constipation and diarrhea.  Endocrine: Negative for polydipsia and polyuria.  Genitourinary:  Negative for dysuria, frequency and urgency.  Musculoskeletal:  Negative for arthralgias, back pain and myalgias.  Skin:  Negative for pallor and rash.  Allergic/Immunologic: Negative for environmental allergies.  Neurological:  Positive for speech difficulty and weakness. Negative for dizziness, syncope, numbness and headaches.  Hematological:  Negative for adenopathy. Does not bruise/bleed easily.  Psychiatric/Behavioral:  Negative for decreased concentration and dysphoric mood. The patient is not nervous/anxious.       Objective:   Physical Exam Constitutional:      Appearance: Normal appearance. She is obese.  Eyes:     General:        Right eye: No discharge.        Left eye: No discharge.     Conjunctiva/sclera: Conjunctivae normal.     Pupils: Pupils are equal, round, and reactive to light.  Cardiovascular:     Rate and Rhythm: Normal rate and regular rhythm.     Heart sounds: Normal heart sounds.  Pulmonary:     Effort: Pulmonary effort is normal. No  respiratory distress.     Breath sounds: Normal breath sounds. No wheezing or rales.  Genitourinary:    Comments: Breast exam: No mass, nodules, thickening, tenderness, bulging, retraction, inflamation, nipple discharge or skin changes noted.  No axillary or clavicular LA.                  Anus appears normal w/o hemorrhoids or masses       External genitalia : nl appearance and hair distribution/no lesions       Urethral meatus : nl size, no lesions or prolapse       Urethra: no masses, tenderness or scarring      Bladder : no masses or tenderness       Vagina: nl general appearance, no discharge or  Lesions, no significant cystocele  or rectocele  Cervix: no lesions/ discharge or friability (is posterior and slightly to L)      Uterus: nl size, contour, position, and mobility (not fixed) , non tender      Adnexa : no masses, tenderness, enlargement or nodularity          Musculoskeletal:     Cervical back: Normal range of motion and neck supple. No rigidity or tenderness.     Right lower leg: No edema.     Left lower leg: No edema.  Lymphadenopathy:     Cervical: No cervical adenopathy.  Skin:    General: Skin is warm and dry.     Coloration: Skin is not pale.     Findings: No erythema or rash.  Neurological:     Mental Status: She is alert.     Comments: Baseline speech difficulty /word finding  Slow and deliberate   Psychiatric:        Mood and Affect: Mood normal.          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  ifob kit, pap and mammogram ordered If all neg-imaging was recommended (PET scan or ct of chest/abd/pelvis) Pending initial screen results  Pt plans to compile list of cancer fam hx Speech is slow and  deliberate today         Other   Encounter for routine gynecological examination    Nl exam w/o complaints  Pending pap result  No menses since ablation in the past        Relevant Orders   Cytology - PAP([place])   Encounter for screening mammogram for breast cancer    Scheduled annual screening mammogram Pt has loop recorder-aware, if radiology req any addnl or different imaging will let me know) Nl breast exam today  Encouraged monthly self exams         Colon cancer screening    In pt with multiple strokes of ? Etiology  Taking plavix  Given ifob kit  Unsure if ins would pay for colonoscopy before age 20       Relevant Orders   Fecal occult blood, imunochemical   Cancer screening    Pt has h/o multiple strokes of ? etiol  Neurologist would like her to have routine cancer screening followed by imaging (CT of abd, pel, chest or PET scan) Plan to check in with her hematologist re: imaging

## 2020-12-17 NOTE — Assessment & Plan Note (Addendum)
Nl exam w/o complaints  Pending pap result  No menses since ablation in the past

## 2020-12-17 NOTE — Assessment & Plan Note (Signed)
Pt has h/o multiple strokes of ? etiol  Neurologist would like her to have routine cancer screening followed by imaging (CT of abd, pel, chest or PET scan) Plan to check in with her hematologist re: imaging

## 2020-12-17 NOTE — Assessment & Plan Note (Signed)
Scheduled annual screening mammogram Pt has loop recorder-aware, if radiology req any addnl or different imaging will let me know) Nl breast exam today  Encouraged monthly self exams

## 2020-12-17 NOTE — Assessment & Plan Note (Signed)
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

## 2020-12-17 NOTE — Patient Instructions (Addendum)
Call and make your appointment for mammogram  Let them know you have a loop recorder   I put the order in  The imaging center can let me know if I need to change orders also   Please do the ifob kit for colon cancer screen   Pap done today  Let's get those results in and then decide on imaging to rule out malignancy  Make a list of all the cancers in your family and you can message that or mail it to [place]

## 2020-12-17 NOTE — Assessment & Plan Note (Signed)
In pt with multiple strokes of ? Etiology  Taking plavix  Given ifob kit  Unsure if ins would pay for colonoscopy before age 50

## 2020-12-18 ENCOUNTER — Ambulatory Visit: Payer: BC Managed Care – PPO | Admitting: Speech Pathology

## 2020-12-18 ENCOUNTER — Encounter: Payer: Self-pay | Admitting: Speech Pathology

## 2020-12-18 DIAGNOSIS — R4701 Aphasia: Secondary | ICD-10-CM | POA: Diagnosis not present

## 2020-12-18 NOTE — Therapy (Signed)
[facility] [address] [place], [place], [address] Phone: [phone]   Fax:  [phone]  Speech Language Pathology Treatment  Patient Details  Name: [name] MRN: [id number] Date of Birth: 02/22/1971 Referring Provider (SLP): Beryle Beams MD   Encounter Date: 12/18/2020   End of Session - 12/18/20 1223     Visit Number 20    Date for SLP Re-Evaluation 02/01/21    Authorization Time Period 5/31/-02/01/21    Authorization - Visit Number 2    Authorization - Number of Visits 17    SLP Start Time 0845    SLP Stop Time  0928    SLP Time Calculation (min) 43 min    Activity Tolerance Patient tolerated treatment well             Past Medical History:  Diagnosis Date   Hx of migraines    Stroke (HCC)    06/03/20    Past Surgical History:  Procedure Laterality Date   BUBBLE STUDY  03/13/2020   Procedure: BUBBLE STUDY;  Surgeon: Quintella Reichert, MD;  [facility];  Service: Cardiovascular;;   ENDOMETRIAL ABLATION     Uterine/heavy menses   IR ANGIO INTRA EXTRACRAN SEL COM CAROTID INNOMINATE BILAT MOD SED  06/06/2020   IR ANGIO VERTEBRAL SEL VERTEBRAL BILAT MOD SED  06/06/2020   IR CT HEAD LTD  09/25/2020   IR PERCUTANEOUS ART THROMBECTOMY/INFUSION INTRACRANIAL INC DIAG ANGIO  09/25/2020   IR US GUIDE VASC ACCESS RIGHT  06/06/2020   RADIOLOGY WITH ANESTHESIA N/A 09/25/2020   Procedure: RADIOLOGY WITH ANESTHESIA;  Surgeon: Radiologist, Medication, MD;  [facility] OR;  Service: Radiology;  Laterality: N/A;   SHOULDER SURGERY     Left,/bone spurs   TEE WITHOUT CARDIOVERSION N/A 03/13/2020   Procedure: TRANSESOPHAGEAL ECHOCARDIOGRAM (TEE);  Surgeon: Quintella Reichert, MD;  Location: [facility] ENDOSCOPY;  Service: Cardiovascular;  Laterality: N/A;    There were no vitals filed for this visit.          ADULT SLP TREATMENT - 12/18/20 1219       General Information   Behavior/Cognition  Alert;Cooperative;Pleasant mood      Treatment Provided   Treatment provided Cognitive-Linquistic      Cognitive-Linquistic Treatment   Treatment focused on Aphasia;Patient/family/caregiver education    Skilled Treatment Pt and mom had several questions on progress towards goals and "how long it will take for pt to be back to 'normal'". SLP edu on spontaneous recovery time period. Redirected pt and family back to how much improvement Reise has made since beginning speech therapy. Re-edu family that aphasia is NOT a loss of intellect, it is just a loss of language skills and in [place]'s case, expressive langauge NOT receptive language. Pt was teary this session due to extraneous factors regarding her health.      Assessment / Recommendations / Plan   Plan Continue with current plan of care      Progression Toward Goals   Progression toward goals Progressing toward goals                SLP Short Term Goals - 12/18/20 1224       SLP SHORT TERM GOAL #1   Title Pt will generate a written sentence without (<5words) spelling errors independently by using Phonological Treatment for Writing.    Time 3    Period Weeks    Status On-going      SLP SHORT TERM GOAL #  2   Title Patient will report use Supported Psychologist, sport and exercise for Aphasia (SCA) strategies during a communication breakdown to increase success in communicative interactions with her family.    Time 3    Period Weeks    Status On-going      SLP SHORT TERM GOAL #3   Title Pt will produce SVO (subject/verb/object) sentences verbally with VnesT treatment independently.    Time 3    Period Weeks    Status On-going      SLP SHORT TERM GOAL #4   Title Pt will decrease time on passage by >2 min by participating in Multiple Oral Re-Reading training at home.    Time 3    Period Weeks    Status On-going              SLP Long Term Goals - 12/18/20 1224       SLP LONG TERM GOAL #1   Title Pt will increase overall reading fluency  by using Multiple Oral Re-reading treatment.    Time 7    Period Weeks    Status On-going      SLP LONG TERM GOAL #2   Title Pt will be able to write a 4+ word, verbally-dictated sentence and correct errors if necessary.    Time 7    Period Weeks    Status On-going      SLP LONG TERM GOAL #3   Title Pt will decrease phonemic paraphasias in speech by using Phonomotor treatment.    Time 7    Period Weeks    Status On-going              Plan - 12/18/20 1224     Clinical Impression Statement Edu provided. See tx section. Pt and family in agreement and understanding. Continue to speech therapy for patient to be able to communicate wants/needs.    Speech Therapy Frequency 2x / week    Duration 8 weeks    Treatment/Interventions Cognitive reorganization;Multimodal communcation approach;Language facilitation;Compensatory techniques;Internal/external aids;SLP instruction and feedback;Environmental controls;Patient/family education;Functional tasks;Cueing hierarchy    Potential to Achieve Goals Good    Potential Considerations Ability to learn/carryover information    Consulted and Agree with Plan of Care Patient;Family member/caregiver    Family Member Consulted Husband             Patient will benefit from skilled therapeutic intervention in order to improve the following deficits and impairments:   Aphasia    Problem List Patient Active Problem List   Diagnosis Date Noted   Colon cancer screening 12/17/2020   Cancer screening 12/17/2020   Encounter for screening mammogram for breast cancer 12/09/2020   Acute ischemic stroke (HCC) 09/25/2020   History of ischemic left MCA stroke s/p tPA 06/06/2020   History of repair of congenital atrial septal defect (ASD) 06/06/2020   Hyperlipidemia LDL goal <70 06/06/2020   Received intravenous tissue plasminogen activator (tPA) in emergency department    Hypothyroid 12/22/2011   Stress incontinence 12/22/2011   Family history of  kidney disease 12/22/2011   Encounter for routine gynecological examination 12/22/2011   Routine general medical examination at a health care facility 12/19/2011   Speech Therapy Progress Note  Visit number: 10 total; 2 in this authorization period (current -01/17/21)  Subjective Statement: Pt is ready to be "back to normal". Concerns about returning to work.  Objective: See recertification note  Goal Update: New goals created last session.  Plan: cont addressing verbal and written language  Reason Skilled  Services are Required: skilled ST services required to address expressive language deficit that impedes patient from completing ADLs.   [address] [place] MS, [place], CBIS  12/18/2020, 12:25 PM  [facility] [address] [place], [place], [address] Phone: [phone]   Fax:  [phone]   Name: RUNA WHITTINGHAM MRN: [id number] Date of Birth: 18-Jan-1971

## 2020-12-19 LAB — CYTOLOGY - PAP
Comment: NEGATIVE
Diagnosis: NEGATIVE
High risk HPV: NEGATIVE

## 2020-12-21 ENCOUNTER — Telehealth: Payer: Self-pay | Admitting: Family Medicine

## 2020-12-21 DIAGNOSIS — Z1231 Encounter for screening mammogram for malignant neoplasm of breast: Secondary | ICD-10-CM

## 2020-12-21 NOTE — Telephone Encounter (Signed)
Pap is negative   Remind her to please schedule mammogram if she has not already done it   Will wait for that result

## 2020-12-23 ENCOUNTER — Ambulatory Visit: Payer: BC Managed Care – PPO | Admitting: Speech Pathology

## 2020-12-23 ENCOUNTER — Encounter: Payer: Self-pay | Admitting: Speech Pathology

## 2020-12-23 ENCOUNTER — Other Ambulatory Visit: Payer: Self-pay

## 2020-12-23 DIAGNOSIS — R4701 Aphasia: Secondary | ICD-10-CM

## 2020-12-23 NOTE — Therapy (Signed)
[facility] [address] [place], [place], [address] Phone: [phone]   Fax:  [phone]  Speech Language Pathology Treatment  Patient Details  Name: [name] MRN: [id number] Date of Birth: 1970-08-29 Referring Provider (SLP): Beryle Beams MD   Encounter Date: 12/23/2020   End of Session - 12/23/20 1023     Visit Number 21    Date for SLP Re-Evaluation 02/01/21    Authorization Time Period 5/31/-02/01/21    Authorization - Visit Number 3    Authorization - Number of Visits 17    SLP Start Time 0800    SLP Stop Time  0844    SLP Time Calculation (min) 44 min    Activity Tolerance Patient tolerated treatment well             Past Medical History:  Diagnosis Date   Hx of migraines    Stroke (HCC)    06/03/20    Past Surgical History:  Procedure Laterality Date   BUBBLE STUDY  03/13/2020   Procedure: BUBBLE STUDY;  Surgeon: Quintella Reichert, MD;  [facility]: [facility];  Service: Cardiovascular;;   ENDOMETRIAL ABLATION     Uterine/heavy menses   IR ANGIO INTRA EXTRACRAN SEL COM CAROTID INNOMINATE BILAT MOD SED  06/06/2020   IR ANGIO VERTEBRAL SEL VERTEBRAL BILAT MOD SED  06/06/2020   IR CT HEAD LTD  09/25/2020   IR PERCUTANEOUS ART THROMBECTOMY/INFUSION INTRACRANIAL INC DIAG ANGIO  09/25/2020   IR US GUIDE VASC ACCESS RIGHT  06/06/2020   RADIOLOGY WITH ANESTHESIA N/A 09/25/2020   Procedure: RADIOLOGY WITH ANESTHESIA;  Surgeon: Radiologist, Medication, MD;  Location: [facility] OR;  Service: Radiology;  Laterality: N/A;   SHOULDER SURGERY     Left,/bone spurs   TEE WITHOUT CARDIOVERSION N/A 03/13/2020   Procedure: TRANSESOPHAGEAL ECHOCARDIOGRAM (TEE);  Surgeon: Quintella Reichert, MD;  Location: [facility] ENDOSCOPY;  Service: Cardiovascular;  Laterality: N/A;    There were no vitals filed for this visit.          ADULT SLP TREATMENT - 12/23/20 0914       General Information   Behavior/Cognition  Alert;Cooperative;Pleasant mood      Treatment Provided   Treatment provided Cognitive-Linquistic      Cognitive-Linquistic Treatment   Treatment focused on Aphasia;Patient/family/caregiver education    Skilled Treatment Facilitated verbal and written language. Pt was able to write 4 word sentences with modI - minA for spelling. Pt was able to name 20/22 objects independently. Pt husband joined today's session.      Assessment / Recommendations / Plan   Plan Continue with current plan of care      Progression Toward Goals   Progression toward goals Progressing toward goals                SLP Short Term Goals - 12/23/20 0911       SLP SHORT TERM GOAL #1   Title Pt will generate a written sentence without (<5words) spelling errors independently by using Phonological Treatment for Writing.    Time 3    Period Weeks    Status On-going      SLP SHORT TERM GOAL #2   Title Patient will report use Supported Psychologist, sport and exercise for Aphasia (SCA) strategies during a communication breakdown to increase success in communicative interactions with her family.    Time 3    Period Weeks    Status On-going      SLP SHORT TERM GOAL #3  Title Pt will produce SVO (subject/verb/object) sentences verbally with VnesT treatment independently.    Time 3    Period Weeks    Status On-going      SLP SHORT TERM GOAL #4   Title Pt will decrease time on passage by >2 min by participating in Multiple Oral Re-Reading training at home.    Time 3    Period Weeks    Status On-going              SLP Long Term Goals - 12/23/20 0912       SLP LONG TERM GOAL #1   Title Pt will increase overall reading fluency by using Multiple Oral Re-reading treatment.    Time 7    Period Weeks    Status On-going      SLP LONG TERM GOAL #2   Title Pt will be able to write a 4+ word, verbally-dictated sentence and correct errors if necessary.    Time 7    Period Weeks    Status On-going      SLP LONG TERM  GOAL #3   Title Pt will decrease phonemic paraphasias in speech by using Phonomotor treatment.    Time 7    Period Weeks    Status On-going              Plan - 12/23/20 1024     Clinical Impression Statement Pt has made improvement towards writing sentences. Pt was able to complete 4-word sentences with minimal cueing from therapist - she required extra time to complete; however, she was able to self-correct errors made in majority of attempts. Continue to speech therapy for patient to be able to communicate wants/needs.    Speech Therapy Frequency 2x / week    Duration 8 weeks    Treatment/Interventions Cognitive reorganization;Multimodal communcation approach;Language facilitation;Compensatory techniques;Internal/external aids;SLP instruction and feedback;Environmental controls;Patient/family education;Functional tasks;Cueing hierarchy    Potential to Achieve Goals Good    Potential Considerations Ability to learn/carryover information    Consulted and Agree with Plan of Care Patient;Family member/caregiver    Family Member Consulted Husband             Patient will benefit from skilled therapeutic intervention in order to improve the following deficits and impairments:   Aphasia    Problem List Patient Active Problem List   Diagnosis Date Noted   Colon cancer screening 12/17/2020   Cancer screening 12/17/2020   Encounter for screening mammogram for breast cancer 12/09/2020   Acute ischemic stroke (HCC) 09/25/2020   History of ischemic left MCA stroke s/p tPA 06/06/2020   History of repair of congenital atrial septal defect (ASD) 06/06/2020   Hyperlipidemia LDL goal <70 06/06/2020   Received intravenous tissue plasminogen activator (tPA) in emergency department    Hypothyroid 12/22/2011   Stress incontinence 12/22/2011   Family history of kidney disease 12/22/2011   Encounter for routine gynecological examination 12/22/2011   Routine general medical examination at a  health care facility 12/19/2011    Michelene Gardener [place] MS, [place], CBIS  12/23/2020, 10:26 AM  [facility] [address] [place], [place], [address] Phone: [phone]   Fax:  [phone]   Name: [name] MRN: [id number] Date of Birth: 1970/08/14

## 2020-12-23 NOTE — Telephone Encounter (Signed)
Patient called and given her pap results. Patient also was informed that she needed to call and set up her mamogram if she has not done so yet. Patient stated she will have her mammogram on Friday patient will await her results from Dr. Milinda Antis.

## 2020-12-24 DIAGNOSIS — Z8774 Personal history of (corrected) congenital malformations of heart and circulatory system: Secondary | ICD-10-CM | POA: Diagnosis not present

## 2020-12-24 DIAGNOSIS — Z8673 Personal history of transient ischemic attack (TIA), and cerebral infarction without residual deficits: Secondary | ICD-10-CM | POA: Diagnosis not present

## 2020-12-24 DIAGNOSIS — I280 Arteriovenous fistula of pulmonary vessels: Secondary | ICD-10-CM | POA: Diagnosis not present

## 2020-12-25 ENCOUNTER — Other Ambulatory Visit: Payer: Self-pay

## 2020-12-25 ENCOUNTER — Ambulatory Visit: Payer: BC Managed Care – PPO | Admitting: Speech Pathology

## 2020-12-25 DIAGNOSIS — R4701 Aphasia: Secondary | ICD-10-CM

## 2020-12-25 NOTE — Therapy (Signed)
[facility] [address] [place], [place], [address] Phone: [phone]   Fax:  [phone]  Speech Language Pathology Treatment  Patient Details  Name: [name] MRN: [id number] Date of Birth: May 14, 1971 Referring Provider (SLP): Beryle Beams MD   Encounter Date: 12/25/2020   End of Session - 12/25/20 0854     Visit Number 22    Date for SLP Re-Evaluation 02/01/21    Authorization Time Period 5/31/-02/01/21    Authorization - Visit Number 4    Authorization - Number of Visits 17    SLP Start Time 0847    SLP Stop Time  0930    SLP Time Calculation (min) 43 min    Activity Tolerance Patient tolerated treatment well             Past Medical History:  Diagnosis Date   Hx of migraines    Stroke (HCC)    06/03/20    Past Surgical History:  Procedure Laterality Date   BUBBLE STUDY  03/13/2020   Procedure: BUBBLE STUDY;  Surgeon: Quintella Reichert, MD;  [facility]: [facility];  Service: Cardiovascular;;   ENDOMETRIAL ABLATION     Uterine/heavy menses   IR ANGIO INTRA EXTRACRAN SEL COM CAROTID INNOMINATE BILAT MOD SED  06/06/2020   IR ANGIO VERTEBRAL SEL VERTEBRAL BILAT MOD SED  06/06/2020   IR CT HEAD LTD  09/25/2020   IR PERCUTANEOUS ART THROMBECTOMY/INFUSION INTRACRANIAL INC DIAG ANGIO  09/25/2020   IR US GUIDE VASC ACCESS RIGHT  06/06/2020   RADIOLOGY WITH ANESTHESIA N/A 09/25/2020   Procedure: RADIOLOGY WITH ANESTHESIA;  Surgeon: Radiologist, Medication, MD;  Location: [facility] OR;  Service: Radiology;  Laterality: N/A;   SHOULDER SURGERY     Left,/bone spurs   TEE WITHOUT CARDIOVERSION N/A 03/13/2020   Procedure: TRANSESOPHAGEAL ECHOCARDIOGRAM (TEE);  Surgeon: Quintella Reichert, MD;  Location: [facility] ENDOSCOPY;  Service: Cardiovascular;  Laterality: N/A;    There were no vitals filed for this visit.   Subjective Assessment - 12/25/20 0849     Subjective Pt reports she is doing well.    Currently in Pain? No/denies                    ADULT SLP TREATMENT - 12/25/20 0938       General Information   Behavior/Cognition Alert;Cooperative;Pleasant mood      Treatment Provided   Treatment provided Cognitive-Linquistic      Cognitive-Linquistic Treatment   Treatment focused on Aphasia;Patient/family/caregiver education    Skilled Treatment Facilitated verbal and written expression w/ confrontation naming of verbs. Pt required minA to write single words; demonstrated cognitive fatigue towards end of session as indicated by increase of mistakes. Re-edu husband and patient on SCA strategies.      Progression Toward Goals   Progression toward goals Progressing toward goals                SLP Short Term Goals - 12/25/20 6962       SLP SHORT TERM GOAL #1   Title Pt will generate a written sentence without (<5words) spelling errors independently by using Phonological Treatment for Writing.    Time 2    Period Weeks    Status On-going      SLP SHORT TERM GOAL #2   Title Patient will report use Supported Psychologist, sport and exercise for Aphasia (SCA) strategies during a communication breakdown to increase success in communicative interactions with her family.    Time 2  Period Weeks    Status On-going      SLP SHORT TERM GOAL #3   Title Pt will produce SVO (subject/verb/object) sentences verbally with VnesT treatment independently.    Time 2    Period Weeks    Status On-going      SLP SHORT TERM GOAL #4   Title Pt will decrease time on passage by >2 min by participating in Multiple Oral Re-Reading training at home.    Time 2    Period Weeks    Status On-going              SLP Long Term Goals - 12/25/20 4235       SLP LONG TERM GOAL #1   Title Pt will increase overall reading fluency by using Multiple Oral Re-reading treatment.    Time 6    Period Weeks    Status On-going      SLP LONG TERM GOAL #2   Title Pt will be able to write a 4+ word, verbally-dictated sentence and correct errors  if necessary.    Time 6    Period Weeks    Status On-going      SLP LONG TERM GOAL #3   Title Pt will decrease phonemic paraphasias in speech by using Phonomotor treatment.    Time 6    Period Weeks    Status On-going              Plan - 12/25/20 0934     Clinical Impression Statement Pt named verbs based on picture cards. Pt then wrote down verbs. Continues to benefit from phonemic cues from phonomotor treatment to spell less familiar single words correctly. Continue to speech therapy for patient to be able to communicate wants/needs.    Speech Therapy Frequency 2x / week    Duration 8 weeks    Treatment/Interventions Cognitive reorganization;Multimodal communcation approach;Language facilitation;Compensatory techniques;Internal/external aids;SLP instruction and feedback;Environmental controls;Patient/family education;Functional tasks;Cueing hierarchy    Potential to Achieve Goals Good    Potential Considerations Ability to learn/carryover information    Consulted and Agree with Plan of Care Patient;Family member/caregiver    Family Member Consulted Husband             Patient will benefit from skilled therapeutic intervention in order to improve the following deficits and impairments:   Aphasia    Problem List Patient Active Problem List   Diagnosis Date Noted   Colon cancer screening 12/17/2020   Cancer screening 12/17/2020   Encounter for screening mammogram for breast cancer 12/09/2020   Acute ischemic stroke (HCC) 09/25/2020   History of ischemic left MCA stroke s/p tPA 06/06/2020   History of repair of congenital atrial septal defect (ASD) 06/06/2020   Hyperlipidemia LDL goal <70 06/06/2020   Received intravenous tissue plasminogen activator (tPA) in emergency department    Hypothyroid 12/22/2011   Stress incontinence 12/22/2011   Family history of kidney disease 12/22/2011   Encounter for routine gynecological examination 12/22/2011   Routine general  medical examination at a health care facility 12/19/2011    Michelene Gardener [place] MS, [place], CBIS  12/25/2020, 9:44 AM  [facility] [address] [place], [place], [address] Phone: [phone]   Fax:  [phone]   Name: MATALIE ROMBERGER MRN: [id number] Date of Birth: May 18, 1971

## 2020-12-26 ENCOUNTER — Ambulatory Visit
Admission: RE | Admit: 2020-12-26 | Discharge: 2020-12-26 | Disposition: A | Discharge status: Home / Self Care | Payer: BC Managed Care – PPO | Source: Ambulatory Visit | Attending: Family Medicine | Admitting: Family Medicine

## 2020-12-26 DIAGNOSIS — Z1231 Encounter for screening mammogram for malignant neoplasm of breast: Secondary | ICD-10-CM

## 2020-12-26 IMAGING — MG MM DIGITAL SCREENING BILAT W/ TOMO AND CAD
8 series · 8 of 24 positions shown · non-contrast
Comparison: None.

CLINICAL DATA: Screening.

EXAM:
DIGITAL SCREENING BILATERAL MAMMOGRAM WITH TOMOSYNTHESIS AND CAD
TECHNIQUE: Bilateral screening digital craniocaudal and mediolateral oblique
mammograms were obtained. Bilateral screening digital breast
tomosynthesis was performed. The images were evaluated with
computer-aided detection.

[R MLO synth-2D]
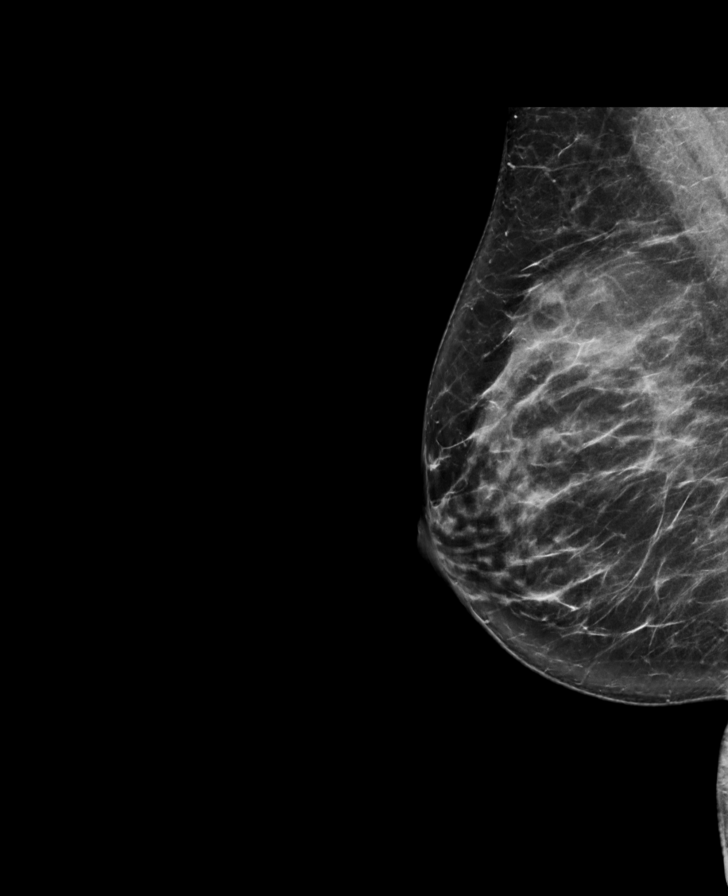

[R CC synth-2D]
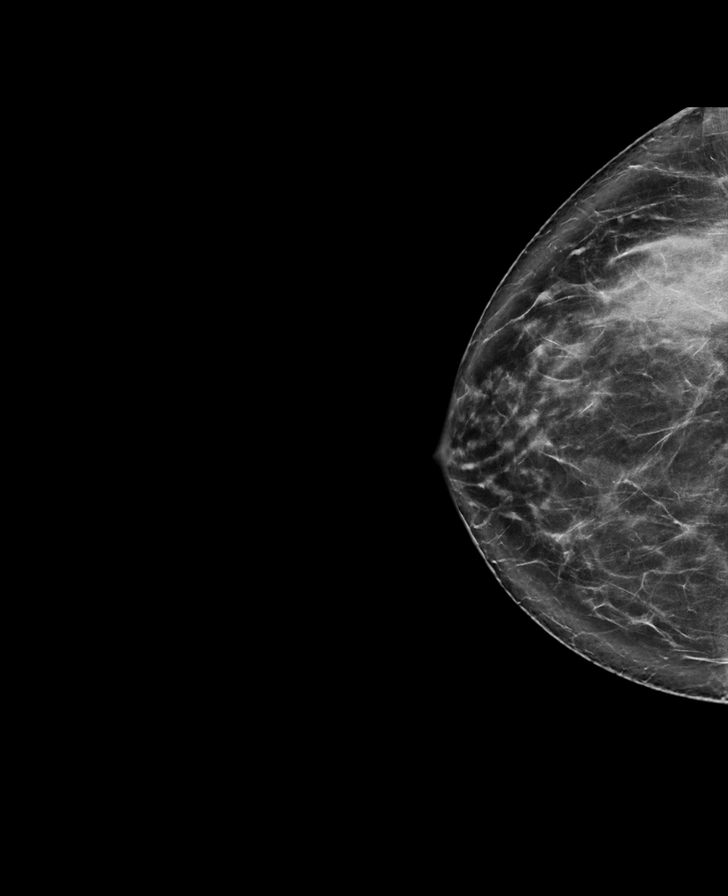

[L CC synth-2D]
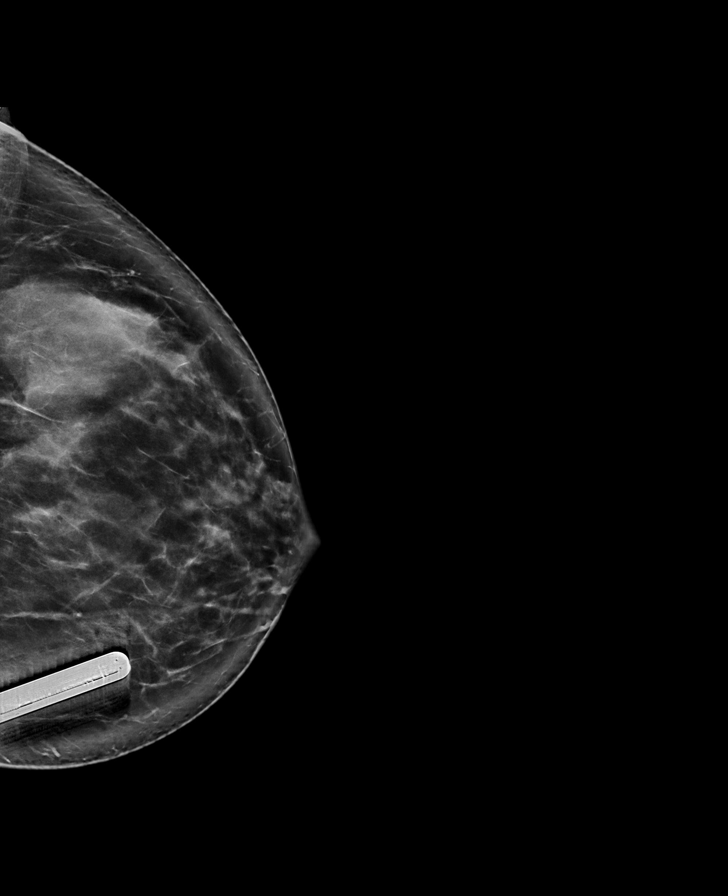

[L MLO synth-2D]
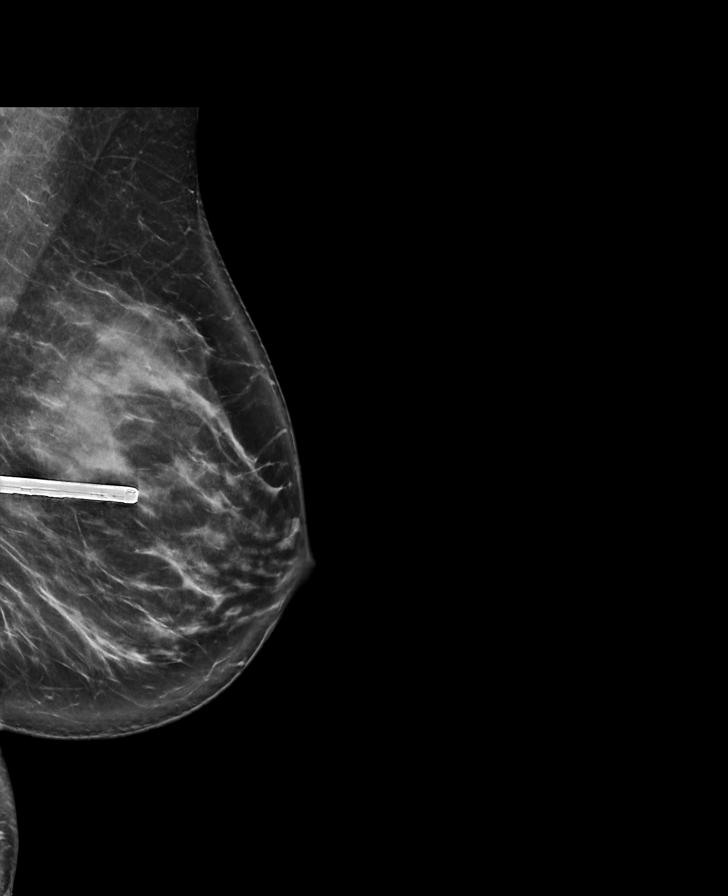

[L CC tomo · tomo slice 46/91.0]
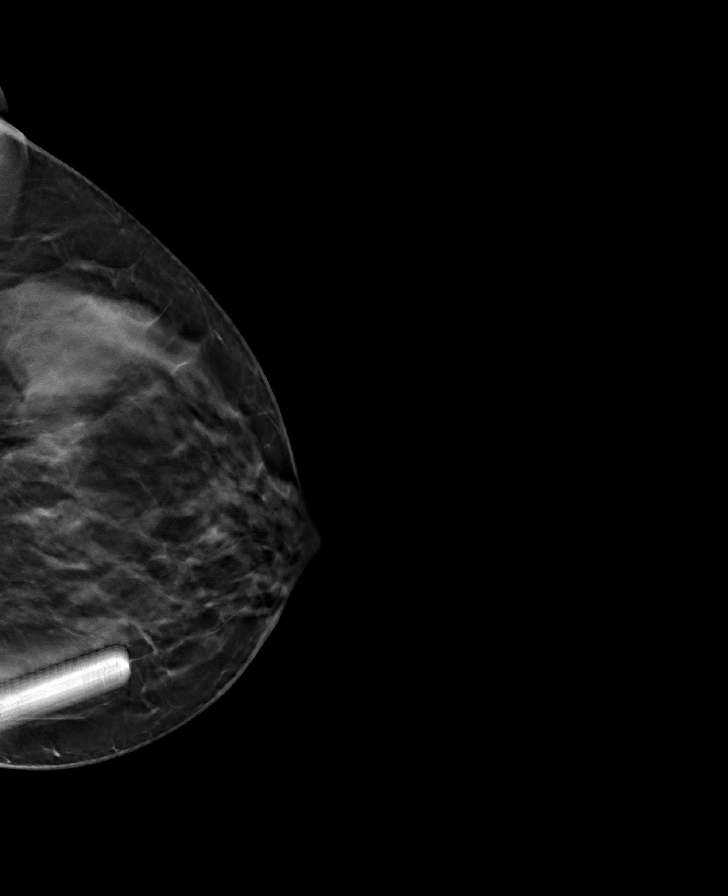

[R MLO tomo · tomo slice 43/86.0]
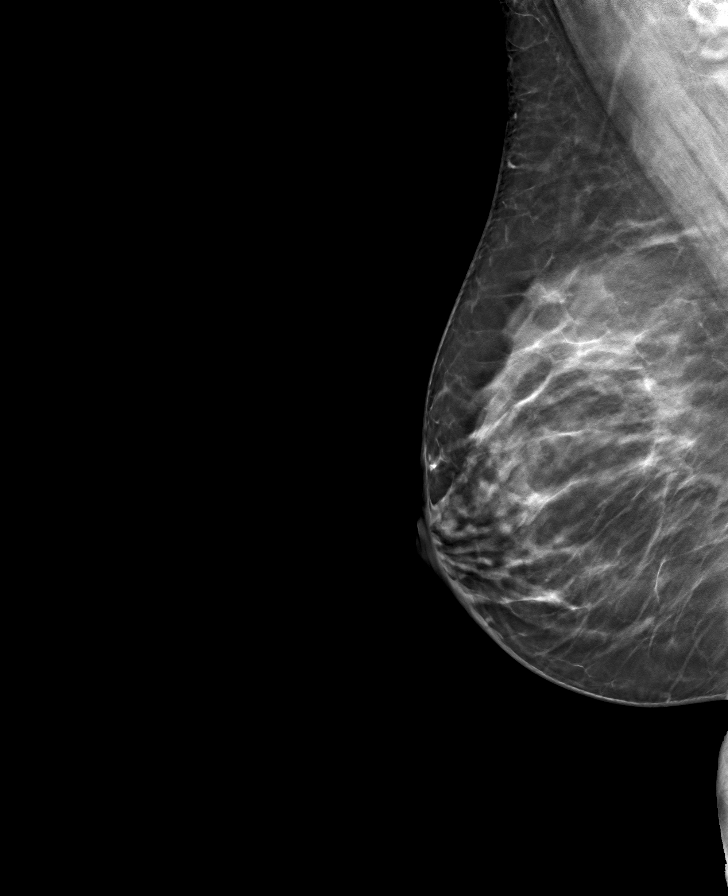

[R CC tomo · tomo slice 47/94.0]
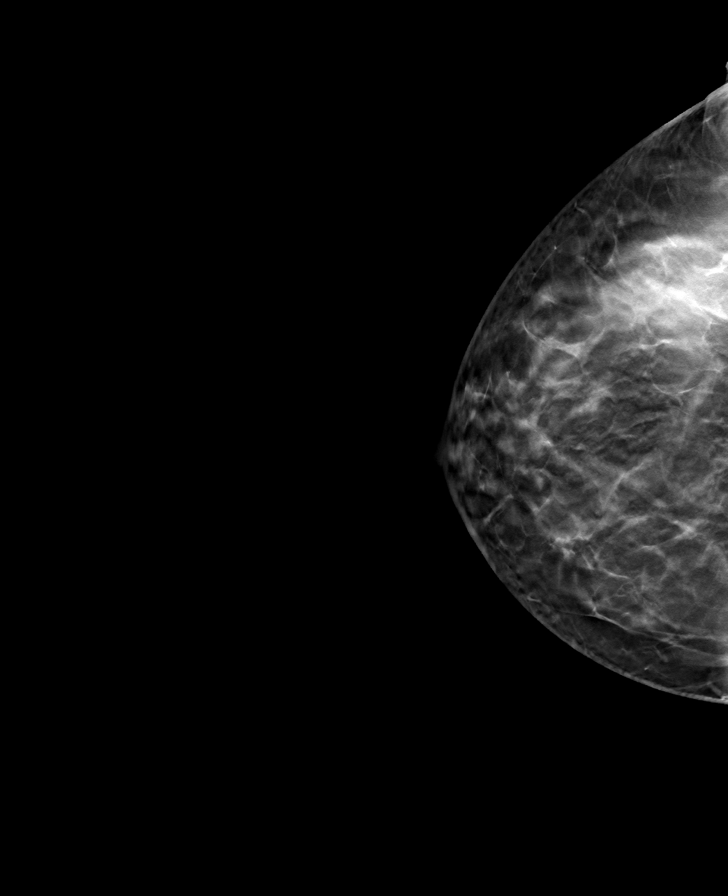

[L MLO tomo · tomo slice 49/97.0]
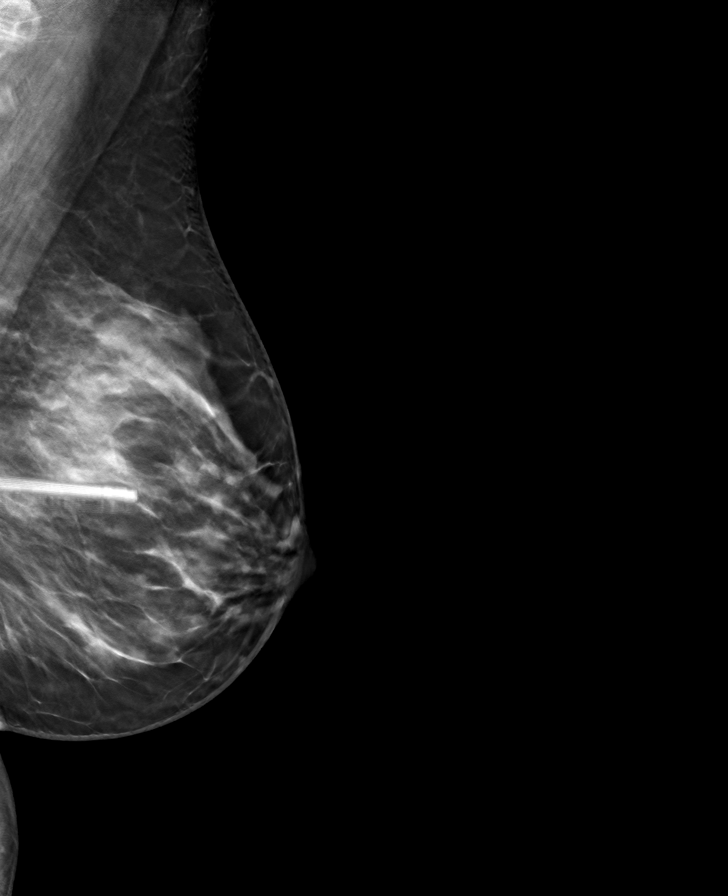

[8 of 24 positions shown; findings below may reference images not displayed]

ACR Breast Density Category c: The breast tissue is heterogeneously
dense, which may obscure small masses
FINDINGS: There are no findings suspicious for malignancy.
IMPRESSION: No mammographic evidence of malignancy. A result letter of this
screening mammogram will be mailed directly to the patient.

RECOMMENDATION:
Screening mammogram in one year. (Code:[6Z])

BI-RADS CATEGORY  1: Negative.

## 2020-12-29 NOTE — Progress Notes (Signed)
Carelink Summary Report / Loop Recorder 

## 2020-12-30 ENCOUNTER — Other Ambulatory Visit: Payer: Self-pay

## 2020-12-30 ENCOUNTER — Ambulatory Visit: Payer: BC Managed Care – PPO | Admitting: Neurology

## 2020-12-30 ENCOUNTER — Ambulatory Visit: Payer: BC Managed Care – PPO | Admitting: Speech Pathology

## 2020-12-30 NOTE — Patient Outreach (Signed)
[facility]) Care Management  12/30/2020  [name] 1971/03/24 [id number]   First telephone outreach attempt to obtain mRS. No answer. Left message for returned call.  [name] [facility] Management Assistant [phone]

## 2020-12-30 NOTE — Telephone Encounter (Signed)
Will route to Tam to f/u with pt regarding FMLA forms

## 2020-12-31 ENCOUNTER — Encounter: Payer: Self-pay | Admitting: Speech Pathology

## 2020-12-31 ENCOUNTER — Ambulatory Visit: Payer: BC Managed Care – PPO | Admitting: Speech Pathology

## 2020-12-31 ENCOUNTER — Telehealth: Payer: Self-pay

## 2020-12-31 ENCOUNTER — Other Ambulatory Visit: Payer: Self-pay

## 2020-12-31 DIAGNOSIS — R4701 Aphasia: Secondary | ICD-10-CM

## 2020-12-31 NOTE — Telephone Encounter (Signed)
Pt returned phone call.  Pt is going to request [employer] fax to [place] fax #  Will call pt to confirm we received the paperwork by 430pm today

## 2020-12-31 NOTE — Telephone Encounter (Signed)
LVM for [name] (husband) to to call me about FMLA paperwork

## 2020-12-31 NOTE — Therapy (Signed)
[facility] [address] [place], [place], [address] Phone: [phone]   Fax:  [phone]  Speech Language Pathology Treatment  Patient Details  Name: [name] MRN: [id number] Date of Birth: 11-Mar-1971 Referring Provider (SLP): Beryle Beams MD   Encounter Date: 12/31/2020   End of Session - 12/31/20 0929     Visit Number 23    Date for SLP Re-Evaluation 02/01/21    Authorization Time Period 5/31/-02/01/21    Authorization - Visit Number 5    Authorization - Number of Visits 17    SLP Start Time 0850   pt late   SLP Stop Time  0935    SLP Time Calculation (min) 45 min    Activity Tolerance Patient tolerated treatment well             Past Medical History:  Diagnosis Date   Hx of migraines    Stroke (HCC)    06/03/20    Past Surgical History:  Procedure Laterality Date   BUBBLE STUDY  03/13/2020   Procedure: BUBBLE STUDY;  Surgeon: Quintella Reichert, MD;  [facility]: [facility];  Service: Cardiovascular;;   ENDOMETRIAL ABLATION     Uterine/heavy menses   IR ANGIO INTRA EXTRACRAN SEL COM CAROTID INNOMINATE BILAT MOD SED  06/06/2020   IR ANGIO VERTEBRAL SEL VERTEBRAL BILAT MOD SED  06/06/2020   IR CT HEAD LTD  09/25/2020   IR PERCUTANEOUS ART THROMBECTOMY/INFUSION INTRACRANIAL INC DIAG ANGIO  09/25/2020   IR US GUIDE VASC ACCESS RIGHT  06/06/2020   RADIOLOGY WITH ANESTHESIA N/A 09/25/2020   Procedure: RADIOLOGY WITH ANESTHESIA;  Surgeon: Radiologist, Medication, MD;  Location: [facility] OR;  Service: Radiology;  Laterality: N/A;   SHOULDER SURGERY     Left,/bone spurs   TEE WITHOUT CARDIOVERSION N/A 03/13/2020   Procedure: TRANSESOPHAGEAL ECHOCARDIOGRAM (TEE);  Surgeon: Quintella Reichert, MD;  Location[facility] ENDOSCOPY;  Service: Cardiovascular;  Laterality: N/A;    There were no vitals filed for this visit.   Subjective Assessment - 12/31/20 0853     Subjective Pt reports she is doing well.    Patient is accompained by:  Family member    Currently in Pain? No/denies                   ADULT SLP TREATMENT - 12/31/20 0919       General Information   Behavior/Cognition Alert;Cooperative;Pleasant mood      Treatment Provided   Treatment provided Cognitive-Linquistic      Cognitive-Linquistic Treatment   Treatment focused on Aphasia    Skilled Treatment Facilitated verbal and written expression with unscrambling words and generating words in a specific category. Pt required minA to unscramble words. She required modA to generate list of items. Provided HEP to complete (unscramble + divergent naming task).      Progression Toward Goals   Progression toward goals Progressing toward goals                SLP Short Term Goals - 12/31/20 0916       SLP SHORT TERM GOAL #1   Title Pt will generate a written sentence without (<5words) spelling errors independently by using Phonological Treatment for Writing.    Time 2    Period Weeks    Status On-going      SLP SHORT TERM GOAL #2   Title Patient will report use Supported Psychologist, sport and exercise for Aphasia (SCA) strategies during a communication breakdown to increase success  in communicative interactions with her family.    Time 2    Period Weeks    Status On-going      SLP SHORT TERM GOAL #3   Title Pt will produce SVO (subject/verb/object) sentences verbally with VnesT treatment independently.    Time 2    Period Weeks    Status On-going      SLP SHORT TERM GOAL #4   Title Pt will decrease time on passage by >2 min by participating in Multiple Oral Re-Reading training at home.    Time 2    Period Weeks    Status On-going              SLP Long Term Goals - 12/31/20 0917       SLP LONG TERM GOAL #1   Title Pt will increase overall reading fluency by using Multiple Oral Re-reading treatment.    Time 6    Period Weeks    Status On-going      SLP LONG TERM GOAL #2   Title Pt will be able to write a 4+ word, verbally-dictated  sentence and correct errors if necessary.    Time 6    Period Weeks    Status On-going      SLP LONG TERM GOAL #3   Title Pt will decrease phonemic paraphasias in speech by using Phonomotor treatment.    Time 6    Period Weeks    Status On-going              Plan - 12/31/20 0930     Clinical Impression Statement Continues to benefit from phonemic cues from phonomotor treatment to spell less familiar single words correctly. Continue to speech therapy for patient to be able to communicate wants/needs.    Speech Therapy Frequency 2x / week    Duration 8 weeks    Treatment/Interventions Cognitive reorganization;Multimodal communcation approach;Language facilitation;Compensatory techniques;Internal/external aids;SLP instruction and feedback;Environmental controls;Patient/family education;Functional tasks;Cueing hierarchy    Potential to Achieve Goals Good    Potential Considerations Ability to learn/carryover information    Consulted and Agree with Plan of Care Patient;Family member/caregiver    Family Member Consulted Husband             Patient will benefit from skilled therapeutic intervention in order to improve the following deficits and impairments:   Aphasia    Problem List Patient Active Problem List   Diagnosis Date Noted   Colon cancer screening 12/17/2020   Cancer screening 12/17/2020   Encounter for screening mammogram for breast cancer 12/09/2020   Acute ischemic stroke (HCC) 09/25/2020   History of ischemic left MCA stroke s/p tPA 06/06/2020   History of repair of congenital atrial septal defect (ASD) 06/06/2020   Hyperlipidemia LDL goal <70 06/06/2020   Received intravenous tissue plasminogen activator (tPA) in emergency department    Hypothyroid 12/22/2011   Stress incontinence 12/22/2011   Family history of kidney disease 12/22/2011   Encounter for routine gynecological examination 12/22/2011   Routine general medical examination at a health care  facility 12/19/2011    [name].SLG  12/31/2020, 9:31 AM  [facility] [address] [place], [place], [address] Phone: [phone]   Fax:  [phone]   Name: SHATERRICA TERRITO MRN: [id number] Date of Birth: May 20, 1971

## 2021-01-01 ENCOUNTER — Ambulatory Visit: Payer: BC Managed Care – PPO | Admitting: Speech Pathology

## 2021-01-01 DIAGNOSIS — R4701 Aphasia: Secondary | ICD-10-CM | POA: Diagnosis not present

## 2021-01-01 NOTE — Therapy (Signed)
[facility] [address] [place], [place], [address] Phone: [phone]   Fax:  [phone]  Speech Language Pathology Treatment  Patient Details  Name: [name] MRN: [id number] Date of Birth: 1971/01/25 Referring Provider (SLP): Beryle Beams MD   Encounter Date: 01/01/2021   End of Session - 01/01/21 1034     Visit Number 24    Date for SLP Re-Evaluation 02/01/21    Authorization Time Period 5/31/-02/01/21    Authorization - Visit Number 6    Authorization - Number of Visits 17    SLP Start Time 0932    SLP Stop Time  1020    SLP Time Calculation (min) 48 min    Activity Tolerance Patient tolerated treatment well             Past Medical History:  Diagnosis Date   Hx of migraines    Stroke (HCC)    06/03/20    Past Surgical History:  Procedure Laterality Date   BUBBLE STUDY  03/13/2020   Procedure: BUBBLE STUDY;  Surgeon: Quintella Reichert, MD;  [facility]: [facility];  Service: Cardiovascular;;   ENDOMETRIAL ABLATION     Uterine/heavy menses   IR ANGIO INTRA EXTRACRAN SEL COM CAROTID INNOMINATE BILAT MOD SED  06/06/2020   IR ANGIO VERTEBRAL SEL VERTEBRAL BILAT MOD SED  06/06/2020   IR CT HEAD LTD  09/25/2020   IR PERCUTANEOUS ART THROMBECTOMY/INFUSION INTRACRANIAL INC DIAG ANGIO  09/25/2020   IR US GUIDE VASC ACCESS RIGHT  06/06/2020   RADIOLOGY WITH ANESTHESIA N/A 09/25/2020   Procedure: RADIOLOGY WITH ANESTHESIA;  Surgeon: Radiologist, Medication, MD;  Location: [facility] OR;  Service: Radiology;  Laterality: N/A;   SHOULDER SURGERY     Left,/bone spurs   TEE WITHOUT CARDIOVERSION N/A 03/13/2020   Procedure: TRANSESOPHAGEAL ECHOCARDIOGRAM (TEE);  Surgeon: Quintella Reichert, MD;  Location: [facility] ENDOSCOPY;  Service: Cardiovascular;  Laterality: N/A;    There were no vitals filed for this visit.   Subjective Assessment - 01/01/21 1005     Subjective Pt reports she is doing well.    Patient is accompained by: Family  member    Currently in Pain? No/denies                   ADULT SLP TREATMENT - 01/01/21 1032       General Information   Behavior/Cognition Alert;Cooperative;Pleasant mood      Treatment Provided   Treatment provided Cognitive-Linquistic      Cognitive-Linquistic Treatment   Treatment focused on Aphasia    Skilled Treatment Trained pt in anagram and copy recall therapy. Pt required modA to complete. Pt benefited from SLP cues for letter-sound correspondance to break down word. Pt required minA to create a sentence verbally with the word created.      Assessment / Recommendations / Plan   Plan Continue with current plan of care      Progression Toward Goals   Progression toward goals Progressing toward goals                SLP Short Term Goals - 01/01/21 1036       SLP SHORT TERM GOAL #1   Title Pt will generate a written sentence without (<5words) spelling errors independently by using Phonological Treatment for Writing.    Time 1    Period Weeks    Status On-going      SLP SHORT TERM GOAL #2   Title Patient will  report use Supported Psychologist, sport and exercise for Aphasia (SCA) strategies during a communication breakdown to increase success in communicative interactions with her family.    Time 1    Period Weeks    Status On-going      SLP SHORT TERM GOAL #3   Title Pt will produce SVO (subject/verb/object) sentences verbally with VnesT treatment independently.    Time 1    Period Weeks    Status On-going      SLP SHORT TERM GOAL #4   Title Pt will decrease time on passage by >2 min by participating in Multiple Oral Re-Reading training at home.    Time 1    Period Weeks    Status On-going              SLP Long Term Goals - 01/01/21 1036       SLP LONG TERM GOAL #1   Title Pt will increase overall reading fluency by using Multiple Oral Re-reading treatment.    Time 5    Period Weeks    Status On-going      SLP LONG TERM GOAL #2   Title Pt will be  able to write a 4+ word, verbally-dictated sentence and correct errors if necessary.    Time 5    Period Weeks    Status On-going      SLP LONG TERM GOAL #3   Title Pt will decrease phonemic paraphasias in speech by using Phonomotor treatment.    Time 5    Period Weeks    Status On-going              Plan - 01/01/21 1035     Clinical Impression Statement Continues to benefit from phonemic cues from phonomotor treatment to spell less familiar single words correctly. Provided pt with HEP to continue at home. Pt reports she can use scrabble tiles to complete ACRT at home. Continue to speech therapy for patient to be able to communicate wants/needs.    Speech Therapy Frequency 2x / week    Duration 8 weeks    Treatment/Interventions Cognitive reorganization;Multimodal communcation approach;Language facilitation;Compensatory techniques;Internal/external aids;SLP instruction and feedback;Environmental controls;Patient/family education;Functional tasks;Cueing hierarchy    Potential to Achieve Goals Good    Potential Considerations Ability to learn/carryover information    Consulted and Agree with Plan of Care Patient;Family member/caregiver    Family Member Consulted Husband             Patient will benefit from skilled therapeutic intervention in order to improve the following deficits and impairments:   Aphasia    Problem List Patient Active Problem List   Diagnosis Date Noted   Colon cancer screening 12/17/2020   Cancer screening 12/17/2020   Encounter for screening mammogram for breast cancer 12/09/2020   Acute ischemic stroke (HCC) 09/25/2020   History of ischemic left MCA stroke s/p tPA 06/06/2020   History of repair of congenital atrial septal defect (ASD) 06/06/2020   Hyperlipidemia LDL goal <70 06/06/2020   Received intravenous tissue plasminogen activator (tPA) in emergency department    Hypothyroid 12/22/2011   Stress incontinence 12/22/2011   Family history of  kidney disease 12/22/2011   Encounter for routine gynecological examination 12/22/2011   Routine general medical examination at a health care facility 12/19/2011    [name] MS, [place], CBIS  01/01/2021, 10:37 AM  [facility] [address] [place], [place], [address] Phone: [phone]   Fax:  [phone]   Name: Nicol  [name] MRN: [id number] Date of Birth: 1971/01/12

## 2021-01-02 ENCOUNTER — Other Ambulatory Visit: Payer: Self-pay

## 2021-01-02 NOTE — Patient Outreach (Signed)
[facility]) Care Management  01/02/2021  [name] [birth year] [id number]   Second telephone outreach attempt to obtain mRS. No answer. Unable to leave message for returned call.  [name] [facility] Management Assistant [phone]

## 2021-01-06 ENCOUNTER — Ambulatory Visit: Payer: BC Managed Care – PPO

## 2021-01-07 ENCOUNTER — Ambulatory Visit (INDEPENDENT_AMBULATORY_CARE_PROVIDER_SITE_OTHER): Payer: BLUE CROSS/BLUE SHIELD

## 2021-01-07 ENCOUNTER — Ambulatory Visit: Payer: BC Managed Care – PPO

## 2021-01-07 ENCOUNTER — Other Ambulatory Visit: Payer: Self-pay

## 2021-01-07 DIAGNOSIS — I639 Cerebral infarction, unspecified: Secondary | ICD-10-CM

## 2021-01-07 LAB — CUP PACEART REMOTE DEVICE CHECK
Date Time Interrogation Session: 20220706084219
Implantable Pulse Generator Implant Date: 20211116

## 2021-01-07 NOTE — Patient Outreach (Signed)
[facility]) Care Management  01/07/2021  [name] 1970/11/04 [id number]   3 outreach attempts were completed to obtain mRs. mRs could not be obtained because patient never returned my calls. mRs=7    [name] Care Management Assistant [phone]

## 2021-01-08 NOTE — Telephone Encounter (Signed)
Spoke with husband [name]) again to let him know I still did not have the fax, he thought the rep at [facility] was off all week.  We agreed to touch base on Wednesday 01/14/2021 if I had not received the fax by then

## 2021-01-09 LAB — CUP PACEART REMOTE DEVICE CHECK
Date Time Interrogation Session: 20220706084219
Implantable Pulse Generator Implant Date: 20211116

## 2021-01-14 NOTE — Telephone Encounter (Signed)
Semi completed FMLA paperwork placed in PCP's inbox  Dr. Milinda Antis this was due 01/12/2021 but we did not receive it until 7/13- [employer] insisted they faxed it but we never received it.  The pt emailed it to me today  Please fill out ASAP if you could when you return Monday  Thank you!

## 2021-01-14 NOTE — Telephone Encounter (Signed)
Informed pt that Dr Milinda Antis will return Tuesday not Monday

## 2021-01-20 ENCOUNTER — Encounter: Payer: Self-pay | Admitting: Speech Pathology

## 2021-01-20 ENCOUNTER — Ambulatory Visit: Payer: BC Managed Care – PPO | Attending: Neurology | Admitting: Speech Pathology

## 2021-01-20 ENCOUNTER — Other Ambulatory Visit: Payer: Self-pay

## 2021-01-20 DIAGNOSIS — R4701 Aphasia: Secondary | ICD-10-CM | POA: Insufficient documentation

## 2021-01-20 NOTE — Telephone Encounter (Signed)
Dr. Milinda Antis filled out form and folder placed on Tam's desk

## 2021-01-20 NOTE — Telephone Encounter (Signed)
LVM informing pt paperwork completed and faxed  Copy mailed to pt  Copy for scan   Copy retained by me 

## 2021-01-20 NOTE — Therapy (Signed)
[facility] [address] [place], [place], [address] Phone: [phone]   Fax:  [phone]  Speech Language Pathology Treatment  Patient Details  Name: [name] MRN: [id number] Date of Birth: 22-Apr-1971 Referring Provider (SLP): Beryle Beams MD   Encounter Date: 01/20/2021   End of Session - 01/20/21 0929     Visit Number 25    Date for SLP Re-Evaluation 02/01/21    Authorization Time Period 5/31/-02/01/21    Authorization - Visit Number 7    Authorization - Number of Visits 17    SLP Start Time 0808    SLP Stop Time  0850    SLP Time Calculation (min) 42 min    Activity Tolerance Patient tolerated treatment well             Past Medical History:  Diagnosis Date   Hx of migraines    Stroke (HCC)    06/03/20    Past Surgical History:  Procedure Laterality Date   BUBBLE STUDY  03/13/2020   Procedure: BUBBLE STUDY;  Surgeon: Quintella Reichert, MD;  [facility];  Service: Cardiovascular;;   ENDOMETRIAL ABLATION     Uterine/heavy menses   IR ANGIO INTRA EXTRACRAN SEL COM CAROTID INNOMINATE BILAT MOD SED  06/06/2020   IR ANGIO VERTEBRAL SEL VERTEBRAL BILAT MOD SED  06/06/2020   IR CT HEAD LTD  09/25/2020   IR PERCUTANEOUS ART THROMBECTOMY/INFUSION INTRACRANIAL INC DIAG ANGIO  09/25/2020   IR US GUIDE VASC ACCESS RIGHT  06/06/2020   RADIOLOGY WITH ANESTHESIA N/A 09/25/2020   Procedure: RADIOLOGY WITH ANESTHESIA;  Surgeon: Radiologist, Medication, MD;  Location: [facility] OR;  Service: Radiology;  Laterality: N/A;   SHOULDER SURGERY     Left,/bone spurs   TEE WITHOUT CARDIOVERSION N/A 03/13/2020   Procedure: TRANSESOPHAGEAL ECHOCARDIOGRAM (TEE);  Surgeon: Quintella Reichert, MD;  Location: [facility] ENDOSCOPY;  Service: Cardiovascular;  Laterality: N/A;    There were no vitals filed for this visit.   Subjective Assessment - 01/20/21 1242     Subjective Pt reports she has an episode two weeks ago where she was unable to  get any words out at all, but only lasted for a day. Also reported this past weekend, she wasn't feeling well and reported tingling in her L arm. SLP advised pt to reach out to doctor.    Currently in Pain? No/denies                   ADULT SLP TREATMENT - 01/20/21 0923       General Information   Behavior/Cognition Alert;Cooperative;Pleasant mood      Treatment Provided   Treatment provided Cognitive-Linquistic      Cognitive-Linquistic Treatment   Treatment focused on Aphasia    Skilled Treatment Developed and plan with patient and husband for better communication. Both described communication breakdowns at home. Re-edu on the importance of using word finding strategies to assist with anomia vs. "giving in to the aphasia"; asking for assistance; husband waiting for Teasia to ask for assistance. Pt to change to x1/week due to insurance change. SLP to change POC to reflect update.      Assessment / Recommendations / Plan   Plan Continue with current plan of care      Progression Toward Goals   Progression toward goals Progressing toward goals                SLP Short Term Goals - 01/20/21 7412  SLP SHORT TERM GOAL #1   Title Pt will generate a written sentence without (<5words) spelling errors independently by using Phonological Treatment for Writing.    Time 1    Period Weeks    Status On-going      SLP SHORT TERM GOAL #2   Title Patient will report use Supported Psychologist, sport and exercise for Aphasia (SCA) strategies during a communication breakdown to increase success in communicative interactions with her family.    Time 1    Period Weeks    Status On-going      SLP SHORT TERM GOAL #3   Title Pt will produce SVO (subject/verb/object) sentences verbally with VnesT treatment independently.    Time 1    Period Weeks    Status On-going      SLP SHORT TERM GOAL #4   Title Pt will decrease time on passage by >2 min by participating in Multiple Oral Re-Reading training  at home.    Time 1    Period Weeks    Status On-going              SLP Long Term Goals - 01/20/21 0932       SLP LONG TERM GOAL #1   Title Pt will increase overall reading fluency by using Multiple Oral Re-reading treatment.    Time 5    Period Weeks    Status On-going      SLP LONG TERM GOAL #2   Title Pt will be able to write a 4+ word, verbally-dictated sentence and correct errors if necessary.    Time 5    Period Weeks    Status On-going      SLP LONG TERM GOAL #3   Title Pt will decrease phonemic paraphasias in speech by using Phonomotor treatment.    Time 5    Period Weeks    Status On-going              Plan - 01/20/21 0930     Clinical Impression Statement Pt reports she has continued to use ACRT aphasia therapy task to assist with writing/reading at home. Keri and husband to trial communication strategies to prevent and repair communication breakdowns. Continue to speech therapy for patient to be able to communicate wants/needs.    Speech Therapy Frequency 1x /week    Duration 8 weeks    Treatment/Interventions Cognitive reorganization;Multimodal communcation approach;Language facilitation;Compensatory techniques;Internal/external aids;SLP instruction and feedback;Environmental controls;Patient/family education;Functional tasks;Cueing hierarchy    Potential to Achieve Goals Good    Potential Considerations Ability to learn/carryover information    Consulted and Agree with Plan of Care Patient;Family member/caregiver    Family Member Consulted Husband             Patient will benefit from skilled therapeutic intervention in order to improve the following deficits and impairments:   Aphasia    Problem List Patient Active Problem List   Diagnosis Date Noted   Colon cancer screening 12/17/2020   Cancer screening 12/17/2020   Encounter for screening mammogram for breast cancer 12/09/2020   Acute ischemic stroke (HCC) 09/25/2020   History of  ischemic left MCA stroke s/p tPA 06/06/2020   History of repair of congenital atrial septal defect (ASD) 06/06/2020   Hyperlipidemia LDL goal <70 06/06/2020   Received intravenous tissue plasminogen activator (tPA) in emergency department    Hypothyroid 12/22/2011   Stress incontinence 12/22/2011   Family history of kidney disease 12/22/2011   Encounter for routine gynecological examination 12/22/2011   Routine general  medical examination at a health care facility 12/19/2011    Michelene Gardener [place] MS, [place], CBIS  01/20/2021, 12:44 PM  [facility] [address]. [place], [place], [address] Phone: [phone]   Fax:  [phone]   Name: [name] MRN: [id number] Date of Birth: 11/06/1970

## 2021-01-22 ENCOUNTER — Telehealth: Payer: Self-pay | Admitting: *Deleted

## 2021-01-22 ENCOUNTER — Encounter: Payer: Self-pay | Admitting: Adult Health

## 2021-01-22 NOTE — Telephone Encounter (Signed)
Received my chart message re: symptoms. Called husband's #, LVM. Called patient's #, spoke with husband who stated episodes of speech difficulty were on Wed, Thurs two weeks ago. This past  Fri she had short episode of her arm randomly moving around. Husband stated her speech has gotten worse this week, words stumbling around. He also sent message to MD at [facility]. Her next FU here is 02/03/21. She has been fine since last Fri, denied any symptoms at all. I advised if she has any of these symptoms again she must go to ED immediately to be evaluated. She and husband verbalized understanding, appreciation.

## 2021-01-22 NOTE — Telephone Encounter (Signed)
Possible recurrent stroke vs TIA vs recrudescence of prior stroke symptoms.  She is currently on max medical therapy - we could do imaging to evaluate for possible recurrent stroke but her current treatment plan would likely not change.  I would highly recommend that if the symptoms should recur, she call 911 immediately for further evaluation.

## 2021-01-28 ENCOUNTER — Encounter: Payer: Self-pay | Admitting: Speech Pathology

## 2021-01-28 ENCOUNTER — Ambulatory Visit: Payer: BC Managed Care – PPO | Admitting: Speech Pathology

## 2021-01-28 ENCOUNTER — Other Ambulatory Visit: Payer: Self-pay

## 2021-01-28 DIAGNOSIS — R4701 Aphasia: Secondary | ICD-10-CM

## 2021-01-28 NOTE — Progress Notes (Signed)
Carelink Summary Report / Loop Recorder 

## 2021-01-28 NOTE — Therapy (Signed)
[place]. [place], [place], [address] Phone: [phone]   Fax:  [phone]  Speech Language Pathology Treatment  Patient Details  Name: [name] MRN: [id number] Date of Birth: [birth year] Referring Provider (SLP): Phillips Odor MD   Encounter Date: 01/28/2021   End of Session - 01/28/21 1319     Visit Number 26    Date for SLP Re-Evaluation 02/28/21    Authorization Time Period 02/28/21    Authorization - Visit Number 1    Authorization - Number of Visits 4    SLP Start Time 0852    SLP Stop Time  0935    SLP Time Calculation (min) 43 min    Activity Tolerance Patient tolerated treatment well             Past Medical History:  Diagnosis Date   Hx of migraines    Stroke ([place])    06/03/20    Past Surgical History:  Procedure Laterality Date   BUBBLE STUDY  03/13/2020   Procedure: BUBBLE STUDY;  Surgeon: Sueanne Margarita, MD;  Location: [place];  Service: Cardiovascular;;   ENDOMETRIAL ABLATION     Uterine/heavy menses   IR ANGIO INTRA EXTRACRAN SEL COM CAROTID INNOMINATE BILAT MOD SED  06/06/2020   IR ANGIO VERTEBRAL SEL VERTEBRAL BILAT MOD SED  06/06/2020   IR CT HEAD LTD  09/25/2020   IR PERCUTANEOUS ART THROMBECTOMY/INFUSION INTRACRANIAL INC DIAG ANGIO  09/25/2020   IR US GUIDE VASC ACCESS RIGHT  06/06/2020   RADIOLOGY WITH ANESTHESIA N/A 09/25/2020   Procedure: RADIOLOGY WITH ANESTHESIA;  Surgeon: Radiologist, Medication, MD;  Location: [place];  Service: Radiology;  Laterality: N/A;   SHOULDER SURGERY     Left,/bone spurs   TEE WITHOUT CARDIOVERSION N/A 03/13/2020   Procedure: TRANSESOPHAGEAL ECHOCARDIOGRAM (TEE);  Surgeon: Sueanne Margarita, MD;  Location: [facility] ENDOSCOPY;  Service: Cardiovascular;  Laterality: N/A;    There were no vitals filed for this visit.   Subjective Assessment - 01/28/21 0856     Subjective Pt reported continuing to address communication.    Currently in Pain?  No/denies                   ADULT SLP TREATMENT - 01/28/21 0001       General Information   Behavior/Cognition Alert;Cooperative;Pleasant mood      Treatment Provided   Treatment provided Cognitive-Linquistic      Cognitive-Linquistic Treatment   Treatment focused on Aphasia    Skilled Treatment Updated STGs with patient and husband. Began edu on NARNIA -  a multilevel treatment (word retrieval , sentence production, discourse structures) with a focus on improving discourse structure. Pt required minA and benefited from visuals.      Assessment / Recommendations / Plan   Plan Continue with current plan of care      Progression Toward Goals   Progression toward goals Progressing toward goals                SLP Short Term Goals - 01/28/21 1321       SLP SHORT TERM GOAL #1   Title Pt will generate a written sentence without (<5words) spelling errors independently by using Phonological Treatment for Writing.    Time 1    Period Weeks    Status Not Met      SLP SHORT TERM GOAL #2   Title Patient will report use Supported Sales executive for Aphasia (SCA) strategies during  a communication breakdown to increase success in communicative interactions with her family.    Time 1    Period Weeks    Status Achieved      SLP SHORT TERM GOAL #3   Title Pt will produce SVO (subject/verb/object) sentences verbally with VnesT treatment independently.    Time 1    Period Weeks    Status Achieved      SLP SHORT TERM GOAL #4   Title Pt will decrease time on passage by >2 min by participating in Multiple Oral Re-Reading training at home.    Time 1    Period Weeks    Status Not Met              SLP Long Term Goals - 01/28/21 1322       SLP LONG TERM GOAL #1   Title Pt will increase overall reading fluency by using Multiple Oral Re-reading treatment.    Time 4    Period Weeks    Status On-going      SLP LONG TERM GOAL #2   Title Pt will be able to write a 4+  word, verbally-dictated sentence and correct errors if necessary with minA.    Time 4    Period Weeks    Status Revised      SLP LONG TERM GOAL #3   Title Pt will decrease phonemic paraphasias in speech by using Phonomotor treatment.    Time 4    Period Weeks    Status Achieved              Plan - 01/28/21 1321     Clinical Impression Statement Pt reports she has continued to use ACRT aphasia therapy task to assist with writing/reading at home. [place] and husband to trial communication strategies to prevent and repair communication breakdowns. Continue to speech therapy for patient to be able to communicate wants/needs.    Speech Therapy Frequency 1x /week    Duration 4 weeks    Treatment/Interventions Cognitive reorganization;Multimodal communcation approach;Language facilitation;Compensatory techniques;Internal/external aids;SLP instruction and feedback;Environmental controls;Patient/family education;Functional tasks;Cueing hierarchy    Potential to Achieve Goals Good    Potential Considerations Ability to learn/carryover information    Consulted and Agree with Plan of Care Patient;Family member/caregiver    Family Member Consulted Husband             Patient will benefit from skilled therapeutic intervention in order to improve the following deficits and impairments:   Aphasia    Problem List Patient Active Problem List   Diagnosis Date Noted   Colon cancer screening 12/17/2020   Cancer screening 12/17/2020   Encounter for screening mammogram for breast cancer 12/09/2020   Acute ischemic stroke ([place]) 09/25/2020   History of ischemic left MCA stroke s/p tPA 06/06/2020   History of repair of congenital atrial septal defect (ASD) 06/06/2020   Hyperlipidemia LDL goal <70 06/06/2020   Received intravenous tissue plasminogen activator (tPA) in emergency department    Hypothyroid 12/22/2011   Stress incontinence 12/22/2011   Family history of kidney disease 12/22/2011    Encounter for routine gynecological examination 12/22/2011   Routine general medical examination at a health care facility 12/19/2011    [place], [place], CBIS  01/28/2021, 1:23 PM  [place]. [place], [place], [address] Phone: [phone]   Fax:  [phone]   Name: [name] MRN: [id number] Date of Birth: 1970-12-26

## 2021-02-02 DIAGNOSIS — I6930 Unspecified sequelae of cerebral infarction: Secondary | ICD-10-CM | POA: Diagnosis not present

## 2021-02-03 ENCOUNTER — Other Ambulatory Visit: Payer: Self-pay

## 2021-02-03 ENCOUNTER — Other Ambulatory Visit: Payer: Self-pay | Admitting: Neurology

## 2021-02-03 ENCOUNTER — Encounter: Payer: Self-pay | Admitting: Adult Health

## 2021-02-03 ENCOUNTER — Ambulatory Visit: Payer: BC Managed Care – PPO | Admitting: Adult Health

## 2021-02-03 VITALS — BP 109/63 | HR 81 | Ht 64.0 in | Wt 187.0 lb

## 2021-02-03 DIAGNOSIS — I69320 Aphasia following cerebral infarction: Secondary | ICD-10-CM

## 2021-02-03 DIAGNOSIS — I639 Cerebral infarction, unspecified: Secondary | ICD-10-CM

## 2021-02-03 DIAGNOSIS — I6930 Unspecified sequelae of cerebral infarction: Secondary | ICD-10-CM

## 2021-02-03 NOTE — Patient Instructions (Signed)
Continue working with speech therapy for hopeful ongoing recovery  Obtain MR brain to rule out new stroke or concern after your recent speech difficulty episode  Continue clopidogrel 75 mg daily  and atorvastatin for secondary stroke prevention  Continue to follow up with PCP regarding cholesterol and blood pressure management  Maintain strict control of hypertension with blood pressure goal below 130/90 and cholesterol with LDL cholesterol (bad cholesterol) goal below 70 mg/dL.       Followup in the future with me in 4 months or call earlier if needed       Thank you for coming to see [place] at [facility]. I hope we have been able to provide you high quality care today.  You may receive a patient satisfaction survey over the next few weeks. We would appreciate your feedback and comments so that we may continue to improve ourselves and the health of our patients. a

## 2021-02-03 NOTE — Progress Notes (Signed)
[facility] [address] [place]. [place] [address] [phone]       STROKE FOLLOW UP NOTE  Ms. [name] Date of Birth:  [birth year] Medical Record Number:  [id number]   Reason for Referral: Recurrent ESUS    SUBJECTIVE:   CHIEF COMPLAINT:  Chief Complaint  Patient presents with   Follow-up    Rm 2 with spouse [name] Pt is well and stable     HPI:   Today, 02/03/2021, [name] is being seen for 31-month stroke follow-up accompanied by her husband.  She reports having an episode approximately 3 weeks ago with difficulty completing sentences that lasted approximately 4 hours.  She did not seek medical attention at that time but per husband, speech has been hoarse since that time.  Denies any other associated symptoms such as confusion or cognitive changes, visual changes, weakness/numbness, balance difficulties or headache.  She was seen by [facility] neurology yesterday who placed order for MR brain. She is also being followed by [facility] cardiology and possibly pursuing intracardiac echo for further evaluation of PFO.  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  She is currently pursuing long-term disability and request further assistance.  Continues to work with SLP but notes continued improvement.  Reports compliance on aspirin and atorvastatin without associated side effects.  Blood pressure today 109/63.  Loop recorder has not shown atrial fibrillation thus far.  No further concerns at this time.     History provided for reference purposes only Update 10/30/2020 JM: [name] returns for stroke follow-up after prior visit with Dr. Pearlean Brownie 4 months ago accompanied by her husband.  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  Recommended aspirin and Brilinta for 4 weeks and aspirin alone.  LDL 23.  A1c 5.4.  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  Discharged home on 09/29/2020 with recommended outpatient PT/SLP.  Continues to work with SLP for residual Broca's aphasia which has been gradually improving.  At times can worsen with increased stress, fatigued or heightened emotions.  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  Denies new stroke/TIA symptoms.  Completed 4 weeks of Brilinta and has remained on aspirin alone without associated side effects Compliant on atorvastatin 20 mg daily without associated side effects Blood pressure today 112/73 Loop recorder has not shown atrial fibrillation thus far  Both husband and patient are understandably frustrated about continued strokes with unknown cause and lack of further treatment or prevention options.  Discussed Brien Mates trial but she declines interest and questions second opinion from a different neurologist.  Family/friends recommended Jamie Schlichter, MD who is a vascular neurologist at [facility].   No further concerns at this time  Update 07/01/2020 Dr. Pearlean Brownie: Patient returns for follow-up after last visit with [name] 6 weeks ago.  She was readmitted to [facility] on 06/03/2020 with sudden onset of left arm weakness and difficulty lifting and  heaviness.  This happened a few days after she had switched from dual antiplatelet therapy to aspirin only.  MRI scan showed a right frontal precentral gyrus embolic infarct of cryptogenic etiology.  MRA of the brain and neck showed no  large vessel stenosis or occlusion.  She had previously had a cardiac CT in 05/23/2020 which had shown no PFO or shunt and 2D echo and TEE in September which were unremarkable.  Loop interrogation this admission showed no paroxysmal A. fib.  LP was offered but she declined.  Cerebral angiogram was obtained to look for cerebral vasculitis but was negative.  LDL cholesterol was 64 mg percent and hemoglobin A1c was 5.3.  She was restarted on dual antiplatelet therapy aspirin 81 mg and Plavix 75 mg daily and continued deferred to come back to see me today.  She states she is done well.  Her left arm weakness has resolved completely.  She has slightly diminished fine motor skills in the left hand but no real weakness.  She is tolerating Plavix well without bruising or bleeding as well as Lipitor without muscle aches and pains.  Her blood pressures well controlled today it is 118/69.  She has no new complaints.  She is eating healthy and losing weight  Hospital follow-up 05/20/2020 JM: [name] is being seen for hospital follow-up accompanied by her husband.  She reports initially doing well at discharge but over the past month (approx 1 month post discharge), she has been experiencing word finding difficulties as well as frequent bilateral temporal and occipital headaches.  She does have history of migraines but has not experienced a migraine "in many years".  Headache consistent with throbbing sensation but denies photophobia, phonophobia or nausea/vomiting.  Can occur up to 2-3 times monthly and may last for 3 to 4 days.  She will take Tylenol with some benefit.  She has returned back to work at [employer] dermatology without great difficulty but reports even her colleagues have noticed occasional speech difficulty.  She has remained on both aspirin and Plavix without bleeding or bruising as well as atorvastatin 10 mg daily without myalgias.  Further review of hypercoagulable and autoimmune labs pending at  discharge unremarkable.  She was evaluated by Dr. Excell Seltzer on 05/12/2020 and personally reviewed the office note. Per Dr. Excell Seltzer, he reviewed TEE and I did not appreciate any clear evidence of PFO and TCD negative therefore recommended pursuing cardiac CTA to further assess for cardiac anomalies which is scheduled for this Friday.  He believes increased risk of atrial fibrillation with history of prior heart surgery and continued presence of right atrial dilation and tricuspid regurgitation and referred to EP for further consideration of loop recorder.  She has appointment scheduled today with Dr. Lalla Brothers for further discussion of loop recorder.  No further concerns at this time.  Stroke admission 03/11/2020 [name] is a 50 y.o. female with history of hashimoto thyroiditis and ASD closure 30 years ago who presented to [facility] ED on 03/11/2020 for right arm and leg weakness.  Personally reviewed hospitalization pertinent progress notes, lab work and imaging with summary provided.  Evaluated by Dr. Roda Shutters with stroke work-up revealing right parietal occipital region and left frontal lobe small infarcts s/p tPA, infarcts embolic secondary to unknown source concerning for ASD/PFO related as TEE positive for PFO.  Refer to Dr. Excell Seltzer for outpatient evaluation of PFO.  Hypercoagulable and autoimmune labs pending at discharge.  Recommended DAPT until evaluation with Dr. Excell Seltzer outpatient.  LDL  94 and initiate atorvastatin 20 mg daily. No hx of HTN or DM.  History of Hashimoto thyroiditis not on Synthroid PTA per husband with TSH 6.13 (but FT4 normal), thyroid peroxidase Ab 55 and thyroglobulin Ab 1.5 and advised follow-up with PCP outpatient for further management.  Her stroke risk factors include hx of ASD closure at age 50 and obesity.  No prior stroke history.  Evaluated by therapies and discharged home in stable condition without therapy needs on 03/14/2020.  Stroke: right parieto-occipital region and left frontal lobe  small infarcts, embolic pattern, source uncertain, concerning for ASD/PFO related CT head no acute finding CTA head and neck neg MRI  Brain right parieto-occipital region and left frontal lobe small infartcts MRI C-spine normal cervical spinal cord 2D Echo EF 50-55% TEE unremarkable but positive for PFO TCD bubble study - negative LE venous doppler - negative LDL 94 HgbA1c 5.3 UDS neg hypercoag and autoimmune labs - so far neg, rest pending No indication for LP or heart monitoring at this time - will refer to Dr. Pearlean BrownieSethi for second opinion SCDs for VTE prophylaxis No antithrombotic prior to admission, now on ASA 81 and plavix 75 DAPT for stroke prevention. Continue DAPT until sees Dr. Excell Seltzerooper as outpt. Patient counseled to be compliant with her antithrombotic medications Ongoing aggressive stroke risk factor management Therapy recommendations:  None Disposition:  Discharge to home      ROS:   14 system review of systems performed and negative with exception of those listed in HPI  PMH:  Past Medical History:  Diagnosis Date   Hx of migraines    Stroke (HCC)    06/03/20    PSH:  Past Surgical History:  Procedure Laterality Date   BUBBLE STUDY  03/13/2020   Procedure: BUBBLE STUDY;  Surgeon: Quintella Reicherturner, Traci R, MD;  Location: [facility] ENDOSCOPY;  Service: Cardiovascular;;   ENDOMETRIAL ABLATION     Uterine/heavy menses   IR ANGIO INTRA EXTRACRAN SEL COM CAROTID INNOMINATE BILAT MOD SED  06/06/2020   IR ANGIO VERTEBRAL SEL VERTEBRAL BILAT MOD SED  06/06/2020   IR CT HEAD LTD  09/25/2020   IR PERCUTANEOUS ART THROMBECTOMY/INFUSION INTRACRANIAL INC DIAG ANGIO  09/25/2020   IR US GUIDE VASC ACCESS RIGHT  06/06/2020   RADIOLOGY WITH ANESTHESIA N/A 09/25/2020   Procedure: RADIOLOGY WITH ANESTHESIA;  Surgeon: Radiologist, Medication, MD;  Location: [facility] OR;  Service: Radiology;  Laterality: N/A;   SHOULDER SURGERY     Left,/bone spurs   TEE WITHOUT CARDIOVERSION N/A 03/13/2020   Procedure:  TRANSESOPHAGEAL ECHOCARDIOGRAM (TEE);  Surgeon: Quintella Reicherturner, Traci R, MD;  [facility];  Service: Cardiovascular;  Laterality: N/A;    Social History:  Social History   Socioeconomic History   Marital status: Married    Spouse name: Not on file   Number of children: Not on file   Years of education: Not on file   Highest education level: Not on file  Occupational History   Occupation: full time  Tobacco Use   Smoking status: Former   Smokeless tobacco: Never  Substance and Sexual Activity   Alcohol use: Yes    Alcohol/week: 1.0 standard drink    Types: 1 Cans of beer per week    Comment: states 1 beer every other day or so    Drug use: Never   Sexual activity: Not on file  Other Topics Concern   Not on file  Social History Narrative   Last Mamm: 04.07.10 @ [facility] BI-RADS Category 1: Negative^MM  DIGITAL DG BILATERAL   Lives with husband and 2 daughters   Left Handed   Drinks no caffeine   Social Determinants of Health   Financial Resource Strain: Not on file  Food Insecurity: Not on file  Transportation Needs: No Transportation Needs   Lack of Transportation (Medical): No   Lack of Transportation (Non-Medical): No  Physical Activity: Not on file  Stress: Not on file  Social Connections: Not on file  Intimate Partner Violence: Not on file    Family History:  Family History  Problem Relation Age of Onset   Hypertension Mother    Hypothyroidism Mother    High Cholesterol Mother    Atrial fibrillation Mother    COPD Father    Emphysema Father    Hypothyroidism Sister     Medications:   Current Outpatient Medications on File Prior to Visit  Medication Sig Dispense Refill   atorvastatin (LIPITOR) 20 MG tablet Take 1 tablet (20 mg total) by mouth daily. 90 tablet 3   clopidogrel (PLAVIX) 75 MG tablet Take 1 tablet (75 mg total) by mouth daily. 90 tablet 3   Rimegepant Sulfate 75 MG TBDP Take one tablet daily for onset migraine. (max 1 tab / 24 hours) 8 tablet  5   No current facility-administered medications on file prior to visit.    Allergies:   Allergies  Allergen Reactions   Bee Venom Swelling      OBJECTIVE:  Physical Exam  Vitals:   02/03/21 1436  BP: 109/63  Pulse: 81  Weight: 187 lb (84.8 kg)  Height: 5\' 4"  (1.626 m)   Body mass index is 32.1 kg/m. No results found.   General: well developed, well nourished, pleasant middle-age Caucasian female, seated, in no evident distress Head: head normocephalic and atraumatic.   Neck: supple with no carotid or supraclavicular bruits Cardiovascular: regular rate and rhythm, no murmurs Musculoskeletal: no deformity Skin:  no rash/petichiae Vascular:  Normal pulses all extremities   Neurologic Exam Mental Status: Awake and fully alert.  Moderate expressive aphasia however with improvement since prior visit.  No evidence of dysarthria.  Able to speak in short sentences.  Able to follow simple commands without difficulty.  Oriented to place and time. Recent and remote memory intact. Attention span, concentration and fund of knowledge appropriate. Mood and affect appropriate.  Cranial Nerves: Pupils equal, briskly reactive to light. Extraocular movements full without nystagmus. Visual fields full to confrontation. Hearing intact. Facial sensation intact.  Right facial nasolabial fold flattening.  Tongue and palate moves normally and symmetrically.  Motor: Normal bulk and tone. Normal strength in all tested extremity muscles. Sensory.: intact to touch , pinprick , position and vibratory sensation.  Coordination: Rapid alternating movements normal in all extremities. Finger-to-nose and heel-to-shin performed accurately bilaterally. Gait and Station: Arises from chair without difficulty. Stance is normal. Gait demonstrates normal stride length and balance without use of assistive device.  Able to tandem walk and heel toe without difficulty. Reflexes: 1+ and symmetric. Toes downgoing.          ASSESSMENT: [name] is a 50 y.o. 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  Vascular risk factors include history of ASD and s/p closure 30 years ago, HLD,  thyroiditis, former tobacco use and history of menstrual migraines    PLAN:  Recurrent ESUS: Residual deficits: Moderate expressive aphasia although improvement noted since prior visit.  Worsening of speech approximately 3 weeks ago lasting for approximately 4 hours -  plans to undergo MRI brain. Will further assess for long-term disability  She has since established care with [facility] neurology and cardiology to further evaluate potential causes of recurrent cryptogenic strokes possibly undergoing intracardiac echo to further evaluate possible small leak from patch using to close ASD Continue Plavix and atorvastatin for secondary stroke prevention.   Loop recorder has not shown atrial fibrillation thus far Prior work-up: TCD negative.  TEE no significant IA shunt.  Cardiac CT no CAD, PFO or IA shunt.  Cerebral angiogram negative for vasculitis.  Hypercoagulable and autoimmune labs unremarkable Discussed secondary stroke prevention measures and importance of close PCP follow up for aggressive stroke risk factor management      Follow up in 4 months or call earlier if needed   CC:  [facility] provider: Dr. Margaretmary Dys, Audrie Gallus, MD    I spent 43 minutes of face-to-face and non-face-to-face time with patient and husband.  This included previsit chart review, lab review, study review, electronic health record documentation, patient education and discussion regarding history of prior stroke and recent episode of worsening speech and possible etiologies, evaluation by [facility] neurology and cardiology, secondary stroke prevention measures and aggressive stroke risk factor management, residual stroke deficits and answered all other questions to patient and husbands satisfaction  Ihor Austin, AGNP-BC  [facility] [address] [place], [place] [address]  Phone [phone] Fax [phone] Note: This document was prepared with digital dictation and possible smart phrase technology. Any transcriptional errors that result from this process are unintentional.

## 2021-02-04 ENCOUNTER — Ambulatory Visit: Payer: BC Managed Care – PPO | Attending: Neurology | Admitting: Speech Pathology

## 2021-02-04 ENCOUNTER — Encounter: Payer: Self-pay | Admitting: Speech Pathology

## 2021-02-04 ENCOUNTER — Telehealth: Payer: Self-pay | Admitting: *Deleted

## 2021-02-04 ENCOUNTER — Other Ambulatory Visit: Payer: Self-pay

## 2021-02-04 DIAGNOSIS — R4701 Aphasia: Secondary | ICD-10-CM | POA: Diagnosis not present

## 2021-02-04 NOTE — Telephone Encounter (Signed)
I can do the auth for her if you put in an order for the MRI. The one in here is from a doctor at [facility].

## 2021-02-04 NOTE — Telephone Encounter (Signed)
Completed and placed in out box.  May also want to send recent SLP note. Thank you.

## 2021-02-04 NOTE — Telephone Encounter (Signed)
Form completed, signed. Last SLT note printed. Form, office note and SLT note sent to medical records for processing.

## 2021-02-04 NOTE — Telephone Encounter (Signed)
Received Physician's report, Nervous system questionnaire, [employer] insurance [employer]. Placed in NP's inbox for completion.

## 2021-02-04 NOTE — Therapy (Signed)
[facility] [address] [place], [place], [address] Phone: [phone]   Fax:  [phone]  Speech Language Pathology Treatment  Patient Details  Name: [name] MRN: [id number] Date of Birth: [birth year] Referring Provider (SLP): Beryle Beams MD   Encounter Date: 02/04/2021   End of Session - 02/04/21 1146     Visit Number 27    Date for SLP Re-Evaluation 02/28/21    Authorization Time Period 02/28/21    Authorization - Visit Number 2    Authorization - Number of Visits 4    SLP Start Time 0845    SLP Stop Time  0930    SLP Time Calculation (min) 45 min    Activity Tolerance Patient tolerated treatment well             Past Medical History:  Diagnosis Date   Hx of migraines    Stroke (HCC)    06/03/20    Past Surgical History:  Procedure Laterality Date   BUBBLE STUDY  03/13/2020   Procedure: BUBBLE STUDY;  Surgeon: Quintella Reichert, MD;  [facility]: [facility];  Service: Cardiovascular;;   ENDOMETRIAL ABLATION     Uterine/heavy menses   IR ANGIO INTRA EXTRACRAN SEL COM CAROTID INNOMINATE BILAT MOD SED  06/06/2020   IR ANGIO VERTEBRAL SEL VERTEBRAL BILAT MOD SED  06/06/2020   IR CT HEAD LTD  09/25/2020   IR PERCUTANEOUS ART THROMBECTOMY/INFUSION INTRACRANIAL INC DIAG ANGIO  09/25/2020   IR US GUIDE VASC ACCESS RIGHT  06/06/2020   RADIOLOGY WITH ANESTHESIA N/A 09/25/2020   Procedure: RADIOLOGY WITH ANESTHESIA;  Surgeon: Radiologist, Medication, MD;  Location: [facility] OR;  Service: Radiology;  Laterality: N/A;   SHOULDER SURGERY     Left,/bone spurs   TEE WITHOUT CARDIOVERSION N/A 03/13/2020   Procedure: TRANSESOPHAGEAL ECHOCARDIOGRAM (TEE);  Surgeon: Quintella Reichert, MD;  [facility] ENDOSCOPY;  Service: Cardiovascular;  Laterality: N/A;    There were no vitals filed for this visit.   Subjective Assessment - 02/04/21 0848     Subjective Pt reports she is to have a MRI to r/o symptoms (3 weeks ago) being from a  CVA.    Currently in Pain? No/denies                   ADULT SLP TREATMENT - 02/04/21 1143       General Information   Behavior/Cognition Alert;Cooperative;Pleasant mood      Treatment Provided   Treatment provided Cognitive-Linquistic      Cognitive-Linquistic Treatment   Treatment focused on Aphasia    Skilled Treatment Completed NARNIA -  a multilevel treatment (word retrieval , sentence production, discourse structures) with a focus on improving discourse structure for procedural tasks. Pt required minA to use transitional phrases to connect sentences (first, then, next, etc).      Assessment / Recommendations / Plan   Plan Continue with current plan of care      Progression Toward Goals   Progression toward goals Progressing toward goals                SLP Short Term Goals - 02/04/21 1147       SLP SHORT TERM GOAL #1   Title Pt will generate a written sentence without (<5words) spelling errors with minA by using Phonological Treatment for Writing.    Time 1    Period Weeks    Status Revised      SLP SHORT TERM GOAL #2  Title Patient will report use Supported Psychologist, sport and exercise for Aphasia (SCA) strategies during a communication breakdown to increase success in communicative interactions with her family.    Time 1    Period Weeks    Status Achieved      SLP SHORT TERM GOAL #3   Title Pt will produce SVO (subject/verb/object) sentences verbally with VnesT treatment independently.    Time 1    Period Weeks    Status Achieved      SLP SHORT TERM GOAL #4   Title Pt will decrease time on passage by >2 min by participating in Multiple Oral Re-Reading training at home.    Time 1    Period Weeks    Status On-going              SLP Long Term Goals - 02/04/21 1148       SLP LONG TERM GOAL #1   Title Pt will increase overall reading fluency by using Multiple Oral Re-reading treatment.    Time 4    Period Weeks    Status On-going      SLP LONG  TERM GOAL #2   Title Pt will be able to write a 4+ word, verbally-dictated sentence and correct errors if necessary with minA.    Time 4    Period Weeks    Status Revised      SLP LONG TERM GOAL #3   Title Pt will decrease phonemic paraphasias in speech by using Phonomotor treatment.    Time 4    Period Weeks    Status Achieved              Plan - 02/04/21 1146     Clinical Impression Statement See tx note. Pt benefited from [facility] treatment and made progress throughout today's session. Provided her with HEP for home practice. Continue to speech therapy for patient to be able to communicate wants/needs.    Speech Therapy Frequency 1x /week    Duration 4 weeks    Treatment/Interventions Cognitive reorganization;Multimodal communcation approach;Language facilitation;Compensatory techniques;Internal/external aids;SLP instruction and feedback;Environmental controls;Patient/family education;Functional tasks;Cueing hierarchy    Potential to Achieve Goals Good    Potential Considerations Ability to learn/carryover information    Consulted and Agree with Plan of Care Patient;Family member/caregiver    Family Member Consulted Husband             Patient will benefit from skilled therapeutic intervention in order to improve the following deficits and impairments:   Aphasia    Problem List Patient Active Problem List   Diagnosis Date Noted   Colon cancer screening 12/17/2020   Cancer screening 12/17/2020   Encounter for screening mammogram for breast cancer 12/09/2020   Acute ischemic stroke (HCC) 09/25/2020   History of ischemic left MCA stroke s/p tPA 06/06/2020   History of repair of congenital atrial septal defect (ASD) 06/06/2020   Hyperlipidemia LDL goal <70 06/06/2020   Received intravenous tissue plasminogen activator (tPA) in emergency department    Hypothyroid 12/22/2011   Stress incontinence 12/22/2011   Family history of kidney disease 12/22/2011   Encounter for  routine gynecological examination 12/22/2011   Routine general medical examination at a health care facility 12/19/2011    Michelene Gardener [place] MS, [place], CBIS  02/04/2021, 11:49 AM  [facility] [address] [place], [place], [address] Phone: [phone]   Fax:  [phone]   Name: NEAVE LENGER MRN: [id number] Date of Birth: Mar 05, 1971

## 2021-02-05 ENCOUNTER — Telehealth: Payer: Self-pay | Admitting: *Deleted

## 2021-02-05 NOTE — Progress Notes (Signed)
I agree with the above plan 

## 2021-02-05 NOTE — Telephone Encounter (Signed)
I faxed pt Standard form to [phone].

## 2021-02-09 ENCOUNTER — Ambulatory Visit (INDEPENDENT_AMBULATORY_CARE_PROVIDER_SITE_OTHER): Payer: BC Managed Care – PPO

## 2021-02-09 DIAGNOSIS — I639 Cerebral infarction, unspecified: Secondary | ICD-10-CM

## 2021-02-10 ENCOUNTER — Telehealth: Payer: Self-pay | Admitting: Adult Health

## 2021-02-10 LAB — CUP PACEART REMOTE DEVICE CHECK
Date Time Interrogation Session: 20220808084421
Implantable Pulse Generator Implant Date: 20211116

## 2021-02-10 NOTE — Telephone Encounter (Signed)
Would you be able to look into [name]'s MRI? Thank you!!

## 2021-02-10 NOTE — Telephone Encounter (Signed)
MR Brain wo contrast [facility] BCBS auth: NPR ref # [id number]. Patient is scheduled at [facility] for 02/11/21.

## 2021-02-11 ENCOUNTER — Ambulatory Visit: Payer: BC Managed Care – PPO | Admitting: Speech Pathology

## 2021-02-11 ENCOUNTER — Encounter: Payer: Self-pay | Admitting: Speech Pathology

## 2021-02-11 ENCOUNTER — Other Ambulatory Visit: Payer: Self-pay

## 2021-02-11 ENCOUNTER — Ambulatory Visit: Payer: BC Managed Care – PPO

## 2021-02-11 DIAGNOSIS — I639 Cerebral infarction, unspecified: Secondary | ICD-10-CM

## 2021-02-11 DIAGNOSIS — R4701 Aphasia: Secondary | ICD-10-CM | POA: Diagnosis not present

## 2021-02-11 DIAGNOSIS — I69320 Aphasia following cerebral infarction: Secondary | ICD-10-CM

## 2021-02-11 NOTE — Therapy (Signed)
[facility] [address] [place], [place], [address] Phone: [phone]   Fax:  [phone]  Speech Language Pathology Treatment  Patient Details  Name: [name] MRN: [id number] Date of Birth: Nov 16, 1970 Referring Provider (SLP): Beryle Beams MD   Encounter Date: 02/11/2021   End of Session - 02/11/21 1109     Visit Number 28    Date for SLP Re-Evaluation 02/28/21    Authorization Time Period 02/28/21    Authorization - Visit Number 3    Authorization - Number of Visits 4    SLP Start Time 0850    SLP Stop Time  0940    SLP Time Calculation (min) 50 min    Activity Tolerance Patient tolerated treatment well             Past Medical History:  Diagnosis Date   Hx of migraines    Stroke (HCC)    06/03/20    Past Surgical History:  Procedure Laterality Date   BUBBLE STUDY  03/13/2020   Procedure: BUBBLE STUDY;  Surgeon: Quintella Reichert, MD;  Location: [facility];  Service: Cardiovascular;;   ENDOMETRIAL ABLATION     Uterine/heavy menses   IR ANGIO INTRA EXTRACRAN SEL COM CAROTID INNOMINATE BILAT MOD SED  06/06/2020   IR ANGIO VERTEBRAL SEL VERTEBRAL BILAT MOD SED  06/06/2020   IR CT HEAD LTD  09/25/2020   IR PERCUTANEOUS ART THROMBECTOMY/INFUSION INTRACRANIAL INC DIAG ANGIO  09/25/2020   IR US GUIDE VASC ACCESS RIGHT  06/06/2020   RADIOLOGY WITH ANESTHESIA N/A 09/25/2020   Procedure: RADIOLOGY WITH ANESTHESIA;  Surgeon: Radiologist, Medication, MD;  Location: [facility] OR;  Service: Radiology;  Laterality: N/A;   SHOULDER SURGERY     Left,/bone spurs   TEE WITHOUT CARDIOVERSION N/A 03/13/2020   Procedure: TRANSESOPHAGEAL ECHOCARDIOGRAM (TEE);  Surgeon: Quintella Reichert, MD;  Location: [facility] ENDOSCOPY;  Service: Cardiovascular;  Laterality: N/A;    There were no vitals filed for this visit.   Subjective Assessment - 02/11/21 1103     Subjective Pt reports she has her MRI today. Became emotional regarding aphasia  deficits.    Currently in Pain? No/denies                   ADULT SLP TREATMENT - 02/11/21 0001       General Information   Behavior/Cognition Alert;Cooperative;Pleasant mood      Treatment Provided   Treatment provided Cognitive-Linquistic      Cognitive-Linquistic Treatment   Treatment focused on Aphasia    Skilled Treatment Facilitated receptive and expressive communication by reading a short paragraph and summarizing. Pt continues to have difficulty with function words (is, on, in, the etc.) - increased difficulty noted today. Pt husband reported that pt's mom and sister have also noted increased difficulty with speaking.      Assessment / Recommendations / Plan   Plan Continue with current plan of care      Progression Toward Goals   Progression toward goals Progressing toward goals                SLP Short Term Goals - 02/11/21 1110       SLP SHORT TERM GOAL #1   Title Pt will generate a written sentence without (<5words) spelling errors with minA by using Phonological Treatment for Writing.    Time 1    Period Weeks    Status Revised      SLP SHORT TERM GOAL #  2   Title Patient will report use Supported Psychologist, sport and exercise for Aphasia (SCA) strategies during a communication breakdown to increase success in communicative interactions with her family.    Time 1    Period Weeks    Status Achieved      SLP SHORT TERM GOAL #3   Title Pt will produce SVO (subject/verb/object) sentences verbally with VnesT treatment independently.    Time 1    Period Weeks    Status Achieved      SLP SHORT TERM GOAL #4   Title Pt will decrease time on passage by >2 min by participating in Multiple Oral Re-Reading training at home.    Time 1    Period Weeks    Status On-going              SLP Long Term Goals - 02/11/21 1110       SLP LONG TERM GOAL #1   Title Pt will increase overall reading fluency by using Multiple Oral Re-reading treatment.    Time 4    Period  Weeks    Status On-going    Target Date 02/28/21      SLP LONG TERM GOAL #2   Title Pt will be able to write a 4+ word, verbally-dictated sentence and correct errors if necessary with minA.    Time 4    Period Weeks    Status Revised    Target Date 02/28/21      SLP LONG TERM GOAL #3   Title Pt will decrease phonemic paraphasias in speech by using Phonomotor treatment.    Time 4    Period Weeks    Status Achieved    Target Date 02/28/21              Plan - 02/11/21 1109     Clinical Impression Statement See tx note.  Provided her with HEP for home practice. Continue to speech therapy for patient to be able to communicate wants/needs.    Speech Therapy Frequency 1x /week    Duration 4 weeks    Treatment/Interventions Cognitive reorganization;Multimodal communcation approach;Language facilitation;Compensatory techniques;Internal/external aids;SLP instruction and feedback;Environmental controls;Patient/family education;Functional tasks;Cueing hierarchy    Potential to Achieve Goals Good    Potential Considerations Ability to learn/carryover information    Consulted and Agree with Plan of Care Patient;Family member/caregiver    Family Member Consulted Husband             Patient will benefit from skilled therapeutic intervention in order to improve the following deficits and impairments:   Aphasia    Problem List Patient Active Problem List   Diagnosis Date Noted   Colon cancer screening 12/17/2020   Cancer screening 12/17/2020   Encounter for screening mammogram for breast cancer 12/09/2020   Acute ischemic stroke (HCC) 09/25/2020   History of ischemic left MCA stroke s/p tPA 06/06/2020   History of repair of congenital atrial septal defect (ASD) 06/06/2020   Hyperlipidemia LDL goal <70 06/06/2020   Received intravenous tissue plasminogen activator (tPA) in emergency department    Hypothyroid 12/22/2011   Stress incontinence 12/22/2011   Family history of  kidney disease 12/22/2011   Encounter for routine gynecological examination 12/22/2011   Routine general medical examination at a health care facility 12/19/2011    Michelene Gardener Gapland MS, [place], CBIS  02/11/2021, 11:11 AM  [facility] [address] [place], [place], [address] Phone: [phone]   Fax:  [phone]   Name: [name] MRN: [id number] Date of Birth: [birth year]

## 2021-02-16 ENCOUNTER — Encounter: Payer: Self-pay | Admitting: Adult Health

## 2021-02-17 ENCOUNTER — Encounter: Payer: Self-pay | Admitting: Adult Health

## 2021-02-18 ENCOUNTER — Ambulatory Visit: Payer: BC Managed Care – PPO | Admitting: Speech Pathology

## 2021-02-20 ENCOUNTER — Encounter: Payer: Self-pay | Admitting: Speech Pathology

## 2021-02-20 ENCOUNTER — Ambulatory Visit: Payer: BC Managed Care – PPO | Admitting: Speech Pathology

## 2021-02-20 ENCOUNTER — Other Ambulatory Visit: Payer: Self-pay

## 2021-02-20 DIAGNOSIS — R4701 Aphasia: Secondary | ICD-10-CM

## 2021-02-20 NOTE — Therapy (Signed)
[address] [place], [place], [address] Phone: [phone]   Fax:  [phone]  Speech Language Pathology Treatment  Patient Details  Name: [name] MRN: [id number] Date of Birth: [birth year] Referring Provider (SLP): Phillips Odor MD   Encounter Date: 02/20/2021   End of Session - 02/20/21 1149     Visit Number 29    Date for SLP Re-Evaluation 02/28/21    Authorization Time Period 02/28/21    Authorization - Visit Number 4    Authorization - Number of Visits 4    SLP Start Time 0850    SLP Stop Time  0929    SLP Time Calculation (min) 39 min    Activity Tolerance Patient tolerated treatment well             Past Medical History:  Diagnosis Date   Hx of migraines    Stroke ([place])    06/03/20    Past Surgical History:  Procedure Laterality Date   BUBBLE STUDY  03/13/2020   Procedure: BUBBLE STUDY;  Surgeon: Sueanne Margarita, MD;  Location: [place];  Service: Cardiovascular;;   ENDOMETRIAL ABLATION     Uterine/heavy menses   IR ANGIO INTRA EXTRACRAN SEL COM CAROTID INNOMINATE BILAT MOD SED  06/06/2020   IR ANGIO VERTEBRAL SEL VERTEBRAL BILAT MOD SED  06/06/2020   IR CT HEAD LTD  09/25/2020   IR PERCUTANEOUS ART THROMBECTOMY/INFUSION INTRACRANIAL INC DIAG ANGIO  09/25/2020   IR US GUIDE VASC ACCESS RIGHT  06/06/2020   RADIOLOGY WITH ANESTHESIA N/A 09/25/2020   Procedure: RADIOLOGY WITH ANESTHESIA;  Surgeon: Radiologist, Medication, MD;  Location: [place];  Service: Radiology;  Laterality: N/A;   SHOULDER SURGERY     Left,/bone spurs   TEE WITHOUT CARDIOVERSION N/A 03/13/2020   Procedure: TRANSESOPHAGEAL ECHOCARDIOGRAM (TEE);  Surgeon: Sueanne Margarita, MD;  Location: [facility] ENDOSCOPY;  Service: Cardiovascular;  Laterality: N/A;    There were no vitals filed for this visit.   Subjective Assessment - 02/20/21 0857     Subjective Pt reports that her husband has gone back to work and she has been bored at  home. She said she has been working puzzles.    Currently in Pain? No/denies                   ADULT SLP TREATMENT - 02/20/21 1147       General Information   Behavior/Cognition Alert;Cooperative;Pleasant mood      Treatment Provided   Treatment provided Cognitive-Linquistic      Cognitive-Linquistic Treatment   Treatment focused on Aphasia    Skilled Treatment Facilitated expressive language by use of Soil scientist Program for Aphasia. Pt had most difficulty with transitive sentences in increasing lengths. Benefited from repetition of the model with focus on repeating back sentence in full with all function words. Pt to practice at home for HEP.      Assessment / Recommendations / Plan   Plan Continue with current plan of care      Progression Toward Goals   Progression toward goals Progressing toward goals                SLP Short Term Goals - 02/20/21 1152       SLP SHORT TERM GOAL #1   Title Pt will generate a written sentence without (<5words) spelling errors with minA by using Phonological Treatment for Writing.    Time 1    Period Weeks  Status Not Met      SLP SHORT TERM GOAL #2   Title Patient will report use Supported Sales executive for Aphasia (SCA) strategies during a communication breakdown to increase success in communicative interactions with her family.    Time 1    Period Weeks    Status Achieved      SLP SHORT TERM GOAL #3   Title Pt will produce SVO (subject/verb/object) sentences verbally with VnesT treatment independently.    Time 1    Period Weeks    Status Achieved      SLP SHORT TERM GOAL #4   Title Pt will decrease time on passage by >2 min by participating in Multiple Oral Re-Reading training at home.    Time 1    Period Weeks    Status Achieved              SLP Long Term Goals - 02/20/21 1153       SLP LONG TERM GOAL #1   Title Pt will increase overall reading fluency by using Multiple Oral Re-reading  treatment.    Time 4    Period Weeks    Status Partially Met      SLP LONG TERM GOAL #2   Title Pt will be able to write a 4+ word, verbally-dictated sentence and correct errors if necessary with minA.    Time 4    Period Weeks    Status Achieved      SLP LONG TERM GOAL #3   Title Pt will decrease phonemic paraphasias in speech by using Phonomotor treatment.    Time 4    Period Weeks    Status Achieved              Plan - 02/20/21 1150     Clinical Impression Statement SLP to recertify. Mother is concerned that pt has had a "backslide" in verbal communication. SLP reported this may be due to decreasing therapy frequency. Encouraged to increase practice at home. Continue to speech therapy for patient to be able to communicate wants/needs. SLP to recertify.    Speech Therapy Frequency 1x /week    Duration 4 weeks    Treatment/Interventions Cognitive reorganization;Multimodal communcation approach;Language facilitation;Compensatory techniques;Internal/external aids;SLP instruction and feedback;Environmental controls;Patient/family education;Functional tasks;Cueing hierarchy    Potential to Achieve Goals Good    Potential Considerations Ability to learn/carryover information    Consulted and Agree with Plan of Care Patient;Family member/caregiver    Family Member Consulted Husband             Patient will benefit from skilled therapeutic intervention in order to improve the following deficits and impairments:   Aphasia    Problem List Patient Active Problem List   Diagnosis Date Noted   Colon cancer screening 12/17/2020   Cancer screening 12/17/2020   Encounter for screening mammogram for breast cancer 12/09/2020   Acute ischemic stroke ([place]) 09/25/2020   History of ischemic left MCA stroke s/p tPA 06/06/2020   History of repair of congenital atrial septal defect (ASD) 06/06/2020   Hyperlipidemia LDL goal <70 06/06/2020   Received intravenous tissue plasminogen  activator (tPA) in emergency department    Hypothyroid 12/22/2011   Stress incontinence 12/22/2011   Family history of kidney disease 12/22/2011   Encounter for routine gynecological examination 12/22/2011   Routine general medical examination at a health care facility 12/19/2011    [place], [place], [place]  02/20/2021, 11:54 AM  [address] [place], [place], [address] Phone: [phone]   Fax:  [phone]   Name: ALEXIS REBER MRN: [id number] Date of Birth: [birth year]

## 2021-02-25 ENCOUNTER — Ambulatory Visit: Payer: BC Managed Care – PPO | Admitting: Speech Pathology

## 2021-02-26 ENCOUNTER — Ambulatory Visit: Payer: BC Managed Care – PPO | Admitting: Speech Pathology

## 2021-02-26 ENCOUNTER — Encounter: Payer: Self-pay | Admitting: Speech Pathology

## 2021-02-26 ENCOUNTER — Other Ambulatory Visit: Payer: Self-pay

## 2021-02-26 DIAGNOSIS — R4701 Aphasia: Secondary | ICD-10-CM

## 2021-02-26 NOTE — Therapy (Signed)
[address]. [place], [place], [address] Phone: [phone]   Fax:  [phone]  Speech Language Pathology Treatment  Patient Details  Name: [name] MRN: [id number] Date of Birth: 10-11-1970 Referring Provider (SLP): Phillips Odor MD   Encounter Date: 02/26/2021   End of Session - 02/26/21 1013     Visit Number 30    Number of Visits 120    Date for SLP Re-Evaluation 03/26/21    Authorization Time Period 03/26/21    Authorization - Visit Number --    Authorization - Number of Visits 4    SLP Start Time 0930    SLP Stop Time  1010    SLP Time Calculation (min) 40 min    Activity Tolerance Patient tolerated treatment well             Past Medical History:  Diagnosis Date   Hx of migraines    Stroke ([place])    06/03/20    Past Surgical History:  Procedure Laterality Date   BUBBLE STUDY  03/13/2020   Procedure: BUBBLE STUDY;  Surgeon: Sueanne Margarita, MD;  Location: [place];  Service: Cardiovascular;;   ENDOMETRIAL ABLATION     Uterine/heavy menses   IR ANGIO INTRA EXTRACRAN SEL COM CAROTID INNOMINATE BILAT MOD SED  06/06/2020   IR ANGIO VERTEBRAL SEL VERTEBRAL BILAT MOD SED  06/06/2020   IR CT HEAD LTD  09/25/2020   IR PERCUTANEOUS ART THROMBECTOMY/INFUSION INTRACRANIAL INC DIAG ANGIO  09/25/2020   IR US GUIDE VASC ACCESS RIGHT  06/06/2020   RADIOLOGY WITH ANESTHESIA N/A 09/25/2020   Procedure: RADIOLOGY WITH ANESTHESIA;  Surgeon: Radiologist, Medication, MD;  Location: [place];  Service: Radiology;  Laterality: N/A;   SHOULDER SURGERY     Left,/bone spurs   TEE WITHOUT CARDIOVERSION N/A 03/13/2020   Procedure: TRANSESOPHAGEAL ECHOCARDIOGRAM (TEE);  Surgeon: Sueanne Margarita, MD;  Location: [facility] ENDOSCOPY;  Service: Cardiovascular;  Laterality: N/A;    There were no vitals filed for this visit.   Subjective Assessment - 02/26/21 0937     Subjective Pt reports she has been busy. Pt reports she has  had felt good about her speech in the last week.    Currently in Pain? No/denies                   ADULT SLP TREATMENT - 02/26/21 1015       General Information   Behavior/Cognition Alert;Cooperative;Pleasant mood      Treatment Provided   Treatment provided Cognitive-Linquistic      Cognitive-Linquistic Treatment   Treatment focused on Aphasia    Skilled Treatment Facilitated expressive language by use of Soil scientist Program for Aphasia. Pt had most difficulty with transitive sentences in increasing lengths. Most difficulty noted with "declaritive transitive". Benefited from repetition of the model with focus on repeating back sentence in full with all function words. Pt to practice generating sentences.      Assessment / Recommendations / Plan   Plan Continue with current plan of care      Progression Toward Goals   Progression toward goals Progressing toward goals                SLP Short Term Goals - 02/26/21 1336       SLP SHORT TERM GOAL #1   Title Pt will increase amount of content words per sentence by (2+) using Response Elaboration Training (RET) to expand verbal utterances in conversation  given prompting.    Time 2    Period Weeks    Status New    Target Date 03/12/21              SLP Long Term Goals - 02/26/21 1353       SLP LONG TERM GOAL #1   Title Pt will participate in Response Elaboration Training to increase overall verbal fluency in conversation as reported by pt/pt family.    Time 4    Period Weeks    Status New    Target Date 03/26/21              Plan - 02/26/21 1014     Clinical Impression Statement Recertification this session. Pt continues to demonstrate difficulty in conversation; however, continues to make improvement. See tx note.    Speech Therapy Frequency 1x /week    Duration 4 weeks    Treatment/Interventions Cognitive reorganization;Multimodal communcation approach;Language facilitation;Compensatory  techniques;Internal/external aids;SLP instruction and feedback;Environmental controls;Patient/family education;Functional tasks;Cueing hierarchy    Potential to Achieve Goals Good    Potential Considerations Ability to learn/carryover information    Consulted and Agree with Plan of Care Patient;Family member/caregiver    Family Member Consulted Husband             Patient will benefit from skilled therapeutic intervention in order to improve the following deficits and impairments:   Aphasia    Problem List Patient Active Problem List   Diagnosis Date Noted   Colon cancer screening 12/17/2020   Cancer screening 12/17/2020   Encounter for screening mammogram for breast cancer 12/09/2020   Acute ischemic stroke ([place]) 09/25/2020   History of ischemic left MCA stroke s/p tPA 06/06/2020   History of repair of congenital atrial septal defect (ASD) 06/06/2020   Hyperlipidemia LDL goal <70 06/06/2020   Received intravenous tissue plasminogen activator (tPA) in emergency department    Hypothyroid 12/22/2011   Stress incontinence 12/22/2011   Family history of kidney disease 12/22/2011   Encounter for routine gynecological examination 12/22/2011   Routine general medical examination at a health care facility 12/19/2011    [place], [place], CBIS  02/26/2021, 1:58 PM  [address] [place], [place], [address] Phone: [phone]   Fax:  [phone]   Name: [name] MRN: [id number] Date of Birth: [birth year]

## 2021-03-04 ENCOUNTER — Ambulatory Visit: Payer: BC Managed Care – PPO | Admitting: Speech Pathology

## 2021-03-04 NOTE — Progress Notes (Signed)
Carelink Summary Report / Loop Recorder 

## 2021-03-06 ENCOUNTER — Ambulatory Visit: Payer: BC Managed Care – PPO | Attending: Neurology | Admitting: Speech Pathology

## 2021-03-06 ENCOUNTER — Other Ambulatory Visit: Payer: Self-pay

## 2021-03-06 ENCOUNTER — Encounter: Payer: Self-pay | Admitting: Speech Pathology

## 2021-03-06 DIAGNOSIS — R4701 Aphasia: Secondary | ICD-10-CM | POA: Insufficient documentation

## 2021-03-06 NOTE — Therapy (Signed)
[address]. [place], [place], [address] Phone: [phone]   Fax:  [phone]  Speech Language Pathology Treatment  Patient Details  Name: [name] MRN: [id number] Date of Birth: 05-Jul-1971 Referring Provider (SLP): Phillips Odor MD   Encounter Date: 03/06/2021   End of Session - 03/06/21 1029     Visit Number 31    Number of Visits 120    Date for SLP Re-Evaluation 03/26/21    Authorization Time Period 03/26/21    Authorization - Visit Number 1    Authorization - Number of Visits 4    SLP Start Time 0845    SLP Stop Time  0929    SLP Time Calculation (min) 44 min    Activity Tolerance Patient tolerated treatment well             Past Medical History:  Diagnosis Date   Hx of migraines    Stroke ([place])    06/03/20    Past Surgical History:  Procedure Laterality Date   BUBBLE STUDY  03/13/2020   Procedure: BUBBLE STUDY;  Surgeon: Sueanne Margarita, MD;  Location: [place];  Service: Cardiovascular;;   ENDOMETRIAL ABLATION     Uterine/heavy menses   IR ANGIO INTRA EXTRACRAN SEL COM CAROTID INNOMINATE BILAT MOD SED  06/06/2020   IR ANGIO VERTEBRAL SEL VERTEBRAL BILAT MOD SED  06/06/2020   IR CT HEAD LTD  09/25/2020   IR PERCUTANEOUS ART THROMBECTOMY/INFUSION INTRACRANIAL INC DIAG ANGIO  09/25/2020   IR US GUIDE VASC ACCESS RIGHT  06/06/2020   RADIOLOGY WITH ANESTHESIA N/A 09/25/2020   Procedure: RADIOLOGY WITH ANESTHESIA;  Surgeon: Radiologist, Medication, MD;  Location: [place];  Service: Radiology;  Laterality: N/A;   SHOULDER SURGERY     Left,/bone spurs   TEE WITHOUT CARDIOVERSION N/A 03/13/2020   Procedure: TRANSESOPHAGEAL ECHOCARDIOGRAM (TEE);  Surgeon: Sueanne Margarita, MD;  Location: [facility] ENDOSCOPY;  Service: Cardiovascular;  Laterality: N/A;    There were no vitals filed for this visit.   Subjective Assessment - 03/06/21 1026     Subjective Pt reports she has been doing well. She is working  through disability paperwork.    Currently in Pain? No/denies                   ADULT SLP TREATMENT - 03/06/21 1027       General Information   Behavior/Cognition Alert;Cooperative;Pleasant mood      Treatment Provided   Treatment provided Cognitive-Linquistic      Cognitive-Linquistic Treatment   Treatment focused on Aphasia    Skilled Treatment Facilitated verbal expression by conducting Response Elaboration Training (RET) with patient. At baseline, pt was noted to use an avg of 3 content words. Following the training protocol, pt increased number of content words to an avg of 5. Pt continues to have difficulty with functor words. Will cont to address.      Assessment / Recommendations / Plan   Plan Continue with current plan of care      Progression Toward Goals   Progression toward goals Progressing toward goals                SLP Gutierrez Term Goals - 03/06/21 1031       SLP Gutierrez TERM GOAL #1   Title Pt will increase amount of content words per sentence by (2+) using Response Elaboration Training (RET) to expand verbal utterances in conversation given prompting across 3 sessions.  Time 2    Period Weeks    Status On-going    Target Date 03/12/21              SLP Long Term Goals - 03/06/21 1031       SLP LONG TERM GOAL #1   Title Pt will participate in Response Elaboration Training to increase overall verbal fluency in conversation as reported by pt/pt family.    Time 4    Period Weeks    Status On-going              Plan - 03/06/21 1029     Clinical Impression Statement See tx note. Pt was responsive to RET protocol for to increase length of utterance. Will continue to address for generalization of increased length of utterance into conversation.    Speech Therapy Frequency 1x /week    Duration 4 weeks    Treatment/Interventions Cognitive reorganization;Multimodal communcation approach;Language facilitation;Compensatory  techniques;Internal/external aids;SLP instruction and feedback;Environmental controls;Patient/family education;Functional tasks;Cueing hierarchy    Potential to Achieve Goals Good    Potential Considerations Ability to learn/carryover information    Consulted and Agree with Plan of Care Patient;Family member/caregiver    Family Member Consulted Husband             Patient will benefit from skilled therapeutic intervention in order to improve the following deficits and impairments:   Aphasia    Problem List Patient Active Problem List   Diagnosis Date Noted   Colon cancer screening 12/17/2020   Cancer screening 12/17/2020   Encounter for screening mammogram for breast cancer 12/09/2020   Acute ischemic stroke ([place]) 09/25/2020   History of ischemic left MCA stroke s/p tPA 06/06/2020   History of repair of congenital atrial septal defect (ASD) 06/06/2020   Hyperlipidemia LDL goal <70 06/06/2020   Received intravenous tissue plasminogen activator (tPA) in emergency department    Hypothyroid 12/22/2011   Stress incontinence 12/22/2011   Family history of kidney disease 12/22/2011   Encounter for routine gynecological examination 12/22/2011   Routine general medical examination at a health care facility 12/19/2011    [place], [place], CBIS  03/06/2021, 10:31 AM  [address]. [place], [place], [address] Phone: [phone]   Fax:  [phone]   Name: BUNNIE REHBERG MRN: [id number] Date of Birth: [birth year]

## 2021-03-10 ENCOUNTER — Encounter: Payer: Self-pay | Admitting: Adult Health

## 2021-03-11 ENCOUNTER — Other Ambulatory Visit: Payer: Self-pay

## 2021-03-11 ENCOUNTER — Encounter: Payer: Self-pay | Admitting: Speech Pathology

## 2021-03-11 ENCOUNTER — Ambulatory Visit: Payer: BC Managed Care – PPO | Admitting: Speech Pathology

## 2021-03-11 DIAGNOSIS — R4701 Aphasia: Secondary | ICD-10-CM | POA: Diagnosis not present

## 2021-03-11 NOTE — Therapy (Signed)
[place]. [place], [place], [address] Phone: [phone]   Fax:  [phone]  Speech Language Pathology Treatment  Patient Details  Name: [name] MRN: [id number] Date of Birth: 07/08/1970 Referring Provider (SLP): Phillips Odor MD   Encounter Date: 03/11/2021   End of Session - 03/11/21 1304     Visit Number 32    Number of Visits 120    Date for SLP Re-Evaluation 03/26/21    Authorization Time Period 03/26/21    Authorization - Visit Number 2    Authorization - Number of Visits 4    SLP Start Time 0845    SLP Stop Time  0929    SLP Time Calculation (min) 44 min    Activity Tolerance Patient tolerated treatment well             Past Medical History:  Diagnosis Date   Hx of migraines    Stroke ([place])    06/03/20    Past Surgical History:  Procedure Laterality Date   BUBBLE STUDY  03/13/2020   Procedure: BUBBLE STUDY;  Surgeon: Sueanne Margarita, MD;  Location: [place];  Service: Cardiovascular;;   ENDOMETRIAL ABLATION     Uterine/heavy menses   IR ANGIO INTRA EXTRACRAN SEL COM CAROTID INNOMINATE BILAT MOD SED  06/06/2020   IR ANGIO VERTEBRAL SEL VERTEBRAL BILAT MOD SED  06/06/2020   IR CT HEAD LTD  09/25/2020   IR PERCUTANEOUS ART THROMBECTOMY/INFUSION INTRACRANIAL INC DIAG ANGIO  09/25/2020   IR US GUIDE VASC ACCESS RIGHT  06/06/2020   RADIOLOGY WITH ANESTHESIA N/A 09/25/2020   Procedure: RADIOLOGY WITH ANESTHESIA;  Surgeon: Radiologist, Medication, MD;  Location: [facility];  Service: Radiology;  Laterality: N/A;   SHOULDER SURGERY     Left,/bone spurs   TEE WITHOUT CARDIOVERSION N/A 03/13/2020   Procedure: TRANSESOPHAGEAL ECHOCARDIOGRAM (TEE);  Surgeon: Sueanne Margarita, MD;  Location: [facility] ENDOSCOPY;  Service: Cardiovascular;  Laterality: N/A;    There were no vitals filed for this visit.   Subjective Assessment - 03/11/21 1302     Subjective Mother verbalized concerns about pt progress.     Patient is accompained by: Family member    Currently in Pain? No/denies                   ADULT SLP TREATMENT - 03/11/21 0001       General Information   Behavior/Cognition Alert;Cooperative      Treatment Provided   Treatment provided Cognitive-Linquistic      Cognitive-Linquistic Treatment   Treatment focused on Aphasia    Skilled Treatment SLP educated pt and mother on communication strategies to assist in preventing/repairing conversational breakdowns. Pt provided examples of how these may be beneficial for sending/transmitting messages. Pt mother expressed concerns with progress and recovery of pt. SLP encouraged the pt and mother to think about the "big picture" and recovery pt has made. SLP to provide pt with an "aphasia card" next session for pt use in public situations where increased moments of word finding difficulties occur.      Assessment / Recommendations / Plan   Plan Continue with current plan of care      Progression Toward Goals   Progression toward goals Progressing toward goals              SLP Education - 03/11/21 1303     Education Details Edu about CVA/Aphasia trajectory; communication strategies for loved ones    Person(s)  Educated Patient;Parent(s)    Methods Explanation;Demonstration;Handout    Comprehension Verbalized understanding              SLP Short Term Goals - 03/06/21 1031       SLP SHORT TERM GOAL #1   Title Pt will increase amount of content words per sentence by (2+) using Response Elaboration Training (RET) to expand verbal utterances in conversation given prompting across 3 sessions.    Time 2    Period Weeks    Status On-going    Target Date 03/12/21              SLP Long Term Goals - 03/06/21 1031       SLP LONG TERM GOAL #1   Title Pt will participate in Response Elaboration Training to increase overall verbal fluency in conversation as reported by pt/pt family.    Time 4    Period Weeks    Status  On-going              Plan - 03/11/21 1051     Clinical Impression Statement See tx note. Mother was responsive to communication strategies. Pt and mother to go through handout and choose strategies that would be beneficial for each to implement. SLP to cont with RET next session. Cont with current POC.    Speech Therapy Frequency 1x /week    Duration 4 weeks    Treatment/Interventions Cognitive reorganization;Multimodal communcation approach;Language facilitation;Compensatory techniques;Internal/external aids;SLP instruction and feedback;Environmental controls;Patient/family education;Functional tasks;Cueing hierarchy    Potential to Achieve Goals Good    Potential Considerations Ability to learn/carryover information    Consulted and Agree with Plan of Care Patient;Family member/caregiver    Family Member Consulted Husband             Patient will benefit from skilled therapeutic intervention in order to improve the following deficits and impairments:   Aphasia    Problem List Patient Active Problem List   Diagnosis Date Noted   Colon cancer screening 12/17/2020   Cancer screening 12/17/2020   Encounter for screening mammogram for breast cancer 12/09/2020   Acute ischemic stroke ([place]) 09/25/2020   History of ischemic left MCA stroke s/p tPA 06/06/2020   History of repair of congenital atrial septal defect (ASD) 06/06/2020   Hyperlipidemia LDL goal <70 06/06/2020   Received intravenous tissue plasminogen activator (tPA) in emergency department    Hypothyroid 12/22/2011   Stress incontinence 12/22/2011   Family history of kidney disease 12/22/2011   Encounter for routine gynecological examination 12/22/2011   Routine general medical examination at a health care facility 12/19/2011    [facility] Communication Sciences and Disorders  03/11/2021, 1:10 PM  [place]. [place], [place],  [address] Phone: [phone]   Fax:  [phone]   Name: [name] MRN: [id number] Date of Birth: [birth year]

## 2021-03-16 ENCOUNTER — Ambulatory Visit (INDEPENDENT_AMBULATORY_CARE_PROVIDER_SITE_OTHER): Payer: BC Managed Care – PPO

## 2021-03-16 DIAGNOSIS — I639 Cerebral infarction, unspecified: Secondary | ICD-10-CM | POA: Diagnosis not present

## 2021-03-16 LAB — CUP PACEART REMOTE DEVICE CHECK
Date Time Interrogation Session: 20220910084655
Implantable Pulse Generator Implant Date: 20211116

## 2021-03-19 ENCOUNTER — Encounter: Payer: Self-pay | Admitting: Speech Pathology

## 2021-03-19 ENCOUNTER — Other Ambulatory Visit: Payer: Self-pay

## 2021-03-19 ENCOUNTER — Ambulatory Visit: Payer: BC Managed Care – PPO | Admitting: Speech Pathology

## 2021-03-19 DIAGNOSIS — R4701 Aphasia: Secondary | ICD-10-CM | POA: Diagnosis not present

## 2021-03-19 NOTE — Therapy (Signed)
[place]. [place], [place], [address] Phone: [phone]   Fax:  [phone]  Speech Language Pathology Treatment  Patient Details  Name: [name] MRN: [id number] Date of Birth: 11-29-1970 Referring Provider (SLP): Phillips Odor MD   Encounter Date: 03/19/2021   End of Session - 03/19/21 0817     Visit Number 33    Number of Visits 120    Date for SLP Re-Evaluation 03/26/21    Authorization Time Period 03/26/21    Authorization - Visit Number 3    Authorization - Number of Visits 4    SLP Start Time 0809    SLP Stop Time  9390    SLP Time Calculation (min) 40 min    Activity Tolerance Patient tolerated treatment well             Past Medical History:  Diagnosis Date   Hx of migraines    Stroke ([place])    06/03/20    Past Surgical History:  Procedure Laterality Date   BUBBLE STUDY  03/13/2020   Procedure: BUBBLE STUDY;  Surgeon: Sueanne Margarita, MD;  Location: [place];  Service: Cardiovascular;;   ENDOMETRIAL ABLATION     Uterine/heavy menses   IR ANGIO INTRA EXTRACRAN SEL COM CAROTID INNOMINATE BILAT MOD SED  06/06/2020   IR ANGIO VERTEBRAL SEL VERTEBRAL BILAT MOD SED  06/06/2020   IR CT HEAD LTD  09/25/2020   IR PERCUTANEOUS ART THROMBECTOMY/INFUSION INTRACRANIAL INC DIAG ANGIO  09/25/2020   IR US GUIDE VASC ACCESS RIGHT  06/06/2020   RADIOLOGY WITH ANESTHESIA N/A 09/25/2020   Procedure: RADIOLOGY WITH ANESTHESIA;  Surgeon: Radiologist, Medication, MD;  Location: [facility];  Service: Radiology;  Laterality: N/A;   SHOULDER SURGERY     Left,/bone spurs   TEE WITHOUT CARDIOVERSION N/A 03/13/2020   Procedure: TRANSESOPHAGEAL ECHOCARDIOGRAM (TEE);  Surgeon: Sueanne Margarita, MD;  Location: [facility] ENDOSCOPY;  Service: Cardiovascular;  Laterality: N/A;    There were no vitals filed for this visit.   Subjective Assessment - 03/19/21 0812     Subjective Pt reports she has been feeling good.    Currently  in Pain? No/denies                   ADULT SLP TREATMENT - 03/19/21 0001       General Information   Behavior/Cognition Alert;Cooperative      Treatment Provided   Treatment provided Cognitive-Linquistic      Cognitive-Linquistic Treatment   Treatment focused on Aphasia    Skilled Treatment SLP provided pt w/ an aphasia card to decrease communicative breakdowns with unfamiliar communication partners. Pt participated in Response Elaboration Training targeting sentence expansion. Pt generated sentences utilizing an average of 3 content words independently. Using RET, pt expanded sentences to 5 content words. SLP encouraged pt to practice expanding sentences during conversation to facilitate more natural speech.      Assessment / Recommendations / Plan   Plan Continue with current plan of care      Progression Toward Goals   Progression toward goals Progressing toward goals                SLP Short Term Goals - 03/19/21 0819       SLP SHORT TERM GOAL #1   Title Pt will increase amount of content words per sentence by (2+) using Response Elaboration Training (RET) to expand verbal utterances in conversation given prompting across 3 sessions.  Time 2    Period Weeks    Status On-going    Target Date --              SLP Long Term Goals - 03/06/21 1031       SLP LONG TERM GOAL #1   Title Pt will participate in Response Elaboration Training to increase overall verbal fluency in conversation as reported by pt/pt family.    Time 4    Period Weeks    Status On-going              Plan - 03/19/21 0835     Clinical Impression Statement See tx note. Pt reported she and her family have been using "voice record" function to assist with comprehension due to difficulty reading. She reports this had lead to less communicative breakdown. Pt continues to increase content words in a structured environment. Cont with current POC.    Speech Therapy Frequency 1x  /week    Duration 4 weeks    Treatment/Interventions Cognitive reorganization;Multimodal communcation approach;Language facilitation;Compensatory techniques;Internal/external aids;SLP instruction and feedback;Environmental controls;Patient/family education;Functional tasks;Cueing hierarchy    Potential to Achieve Goals Good    Potential Considerations Ability to learn/carryover information    Consulted and Agree with Plan of Care Patient;Family member/caregiver    Family Member Consulted Husband             Patient will benefit from skilled therapeutic intervention in order to improve the following deficits and impairments:   Aphasia    Problem List Patient Active Problem List   Diagnosis Date Noted   Colon cancer screening 12/17/2020   Cancer screening 12/17/2020   Encounter for screening mammogram for breast cancer 12/09/2020   Acute ischemic stroke ([place]) 09/25/2020   History of ischemic left MCA stroke s/p tPA 06/06/2020   History of repair of congenital atrial septal defect (ASD) 06/06/2020   Hyperlipidemia LDL goal <70 06/06/2020   Received intravenous tissue plasminogen activator (tPA) in emergency department    Hypothyroid 12/22/2011   Stress incontinence 12/22/2011   Family history of kidney disease 12/22/2011   Encounter for routine gynecological examination 12/22/2011   Routine general medical examination at a health care facility 12/19/2011    [facility] Communication Sciences and Disorders  03/19/2021, 10:32 AM  [place]. [place], [place], [address] Phone: [phone]   Fax:  [phone]   Name: KRISS PERLEBERG MRN: [id number] Date of Birth: 1971-03-26

## 2021-03-24 NOTE — Progress Notes (Signed)
Carelink Summary Report / Loop Recorder 

## 2021-03-25 ENCOUNTER — Other Ambulatory Visit: Payer: Self-pay

## 2021-03-25 ENCOUNTER — Ambulatory Visit: Payer: BC Managed Care – PPO | Admitting: Speech Pathology

## 2021-03-25 DIAGNOSIS — R4701 Aphasia: Secondary | ICD-10-CM

## 2021-03-25 NOTE — Therapy (Addendum)
[place]. [place], [place], [address] Phone: [phone]   Fax:  [phone]  Speech Language Pathology Treatment & Recertification  Patient Details  Name: [name] MRN: [id number] Date of Birth: Nov 18, 1970 Referring Provider (SLP): Phillips Odor MD   Encounter Date: 03/25/2021   End of Session - 03/25/21 0810     Visit Number 34    Number of Visits 120    Date for SLP Re-Evaluation 04/29/21    Authorization Time Period 04/29/21    Authorization - Visit Number 4    Authorization - Number of Visits 4    SLP Start Time 0800    SLP Stop Time  0840    SLP Time Calculation (min) 40 min    Activity Tolerance Patient tolerated treatment well             Past Medical History:  Diagnosis Date   Hx of migraines    Stroke ([place])    06/03/20    Past Surgical History:  Procedure Laterality Date   BUBBLE STUDY  03/13/2020   Procedure: BUBBLE STUDY;  Surgeon: Sueanne Margarita, MD;  Location: [place];  Service: Cardiovascular;;   ENDOMETRIAL ABLATION     Uterine/heavy menses   IR ANGIO INTRA EXTRACRAN SEL COM CAROTID INNOMINATE BILAT MOD SED  06/06/2020   IR ANGIO VERTEBRAL SEL VERTEBRAL BILAT MOD SED  06/06/2020   IR CT HEAD LTD  09/25/2020   IR PERCUTANEOUS ART THROMBECTOMY/INFUSION INTRACRANIAL INC DIAG ANGIO  09/25/2020   IR US GUIDE VASC ACCESS RIGHT  06/06/2020   RADIOLOGY WITH ANESTHESIA N/A 09/25/2020   Procedure: RADIOLOGY WITH ANESTHESIA;  Surgeon: Radiologist, Medication, MD;  Location: [place];  Service: Radiology;  Laterality: N/A;   SHOULDER SURGERY     Left,/bone spurs   TEE WITHOUT CARDIOVERSION N/A 03/13/2020   Procedure: TRANSESOPHAGEAL ECHOCARDIOGRAM (TEE);  Surgeon: Sueanne Margarita, MD;  Location: [facility] ENDOSCOPY;  Service: Cardiovascular;  Laterality: N/A;    There were no vitals filed for this visit.   Subjective Assessment - 03/25/21 0806     Subjective Pt reported she used her Aphasia  card at [facility] and the pharmacy techs were very patient with her.    Patient is accompained by: Family member    Currently in Pain? No/denies                   ADULT SLP TREATMENT - 03/25/21 0001       General Information   Behavior/Cognition Alert;Cooperative      Treatment Provided   Treatment provided Cognitive-Linquistic      Cognitive-Linquistic Treatment   Treatment focused on Aphasia    Skilled Treatment RET was completed to target improved word retrieval and utterance length in conversation. Pt was provided with action picture stimuli and initially formed sentences with an average of 6.1 content words. After SLP intervention which included shaping and modeling of responses, the pt was able to generate an average of 7.8 content words per sentence. SLP noted pt benefited from extra time processing the picture (20 sec) prior to generating sentence.      Assessment / Recommendations / Plan   Plan Continue with current plan of care      Progression Toward Goals   Progression toward goals Progressing toward goals                SLP Short Term Goals - 03/25/21 0913       SLP  SHORT TERM GOAL #1   Title Pt will increase amount of content words per sentence by (2+) using Response Elaboration Training (RET) to expand verbal utterances in conversation given prompting across 3 sessions.    Time 2    Period Weeks    Status On-going   Not met on 9/21 to cont   Target Date 04/15/21              SLP Long Term Goals - 03/25/21 0917       SLP LONG TERM GOAL #1   Title Pt will participate in Response Elaboration Training to increase overall verbal fluency in conversation as reported by pt/pt family.    Time 4    Period Weeks    Status On-going   Not me on 9/21 to cont.   Target Date 04/29/21              Plan - 03/25/21 0905     Clinical Impression Statement See tx note. Pt reported less communicative breakdowns in iADLs. Pt increased number of content  words by 3 (from 3 to 6 content words) this session indicating improvement in length of utterance. Pt to be recertified for 4 additional visits to cont addressing verbal expression. Cont. with current POC.    Speech Therapy Frequency 1x /week    Duration 4 weeks    Treatment/Interventions Cognitive reorganization;Multimodal communcation approach;Language facilitation;Compensatory techniques;Internal/external aids;SLP instruction and feedback;Environmental controls;Patient/family education;Functional tasks;Cueing hierarchy    Potential to Achieve Goals Good    Potential Considerations Ability to learn/carryover information    Consulted and Agree with Plan of Care Patient;Family member/caregiver    Family Member Consulted Husband             Patient will benefit from skilled therapeutic intervention in order to improve the following deficits and impairments:   Aphasia    Problem List Patient Active Problem List   Diagnosis Date Noted   Colon cancer screening 12/17/2020   Cancer screening 12/17/2020   Encounter for screening mammogram for breast cancer 12/09/2020   Acute ischemic stroke ([place]) 09/25/2020   History of ischemic left MCA stroke s/p tPA 06/06/2020   History of repair of congenital atrial septal defect (ASD) 06/06/2020   Hyperlipidemia LDL goal <70 06/06/2020   Received intravenous tissue plasminogen activator (tPA) in emergency department    Hypothyroid 12/22/2011   Stress incontinence 12/22/2011   Family history of kidney disease 12/22/2011   Encounter for routine gynecological examination 12/22/2011   Routine general medical examination at a health care facility 12/19/2011    [name]. Communication Sciences and Disorders  03/25/2021, 9:29 AM  [place]. [place], [place], [address] Phone: [phone]   Fax:  [phone]   Name: [name] MRN: [id number] Date of Birth:  [birth year]

## 2021-03-25 NOTE — Therapy (Deleted)
[place]. [place], [place], [address] Phone: [phone]   Fax:  [phone]  Speech Language Pathology Treatment & Recertification  Patient Details  Name: [name] MRN: [id number] Date of Birth: 12-Jul-1970 Referring Provider (SLP): Phillips Odor MD   Encounter Date: 03/25/2021   End of Session - 03/25/21 0810     Visit Number 34    Number of Visits 120    Date for SLP Re-Evaluation 04/29/21    Authorization Time Period 04/29/21    Authorization - Visit Number 4    Authorization - Number of Visits 4    SLP Start Time 0800    SLP Stop Time  0840    SLP Time Calculation (min) 40 min    Activity Tolerance Patient tolerated treatment well             Past Medical History:  Diagnosis Date   Hx of migraines    Stroke ([place])    06/03/20    Past Surgical History:  Procedure Laterality Date   BUBBLE STUDY  03/13/2020   Procedure: BUBBLE STUDY;  Surgeon: Sueanne Margarita, MD;  Location: [place];  Service: Cardiovascular;;   ENDOMETRIAL ABLATION     Uterine/heavy menses   IR ANGIO INTRA EXTRACRAN SEL COM CAROTID INNOMINATE BILAT MOD SED  06/06/2020   IR ANGIO VERTEBRAL SEL VERTEBRAL BILAT MOD SED  06/06/2020   IR CT HEAD LTD  09/25/2020   IR PERCUTANEOUS ART THROMBECTOMY/INFUSION INTRACRANIAL INC DIAG ANGIO  09/25/2020   IR US GUIDE VASC ACCESS RIGHT  06/06/2020   RADIOLOGY WITH ANESTHESIA N/A 09/25/2020   Procedure: RADIOLOGY WITH ANESTHESIA;  Surgeon: Radiologist, Medication, MD;  Location: [place];  Service: Radiology;  Laterality: N/A;   SHOULDER SURGERY     Left,/bone spurs   TEE WITHOUT CARDIOVERSION N/A 03/13/2020   Procedure: TRANSESOPHAGEAL ECHOCARDIOGRAM (TEE);  Surgeon: Sueanne Margarita, MD;  Location: [facility] ENDOSCOPY;  Service: Cardiovascular;  Laterality: N/A;    There were no vitals filed for this visit.   Subjective Assessment - 03/25/21 0806     Subjective Pt reported she used her [facility]phasia  card at [facility] and the pharmacy techs were very patient with her.    Patient is accompained by: Family member    Currently in Pain? No/denies                   ADULT SLP TREATMENT - 03/25/21 0001       General Information   Behavior/Cognition Alert;Cooperative      Treatment Provided   Treatment provided Cognitive-Linquistic      Cognitive-Linquistic Treatment   Treatment focused on Aphasia    Skilled Treatment RET was completed to target improved word retrieval and utterance length in conversation. Pt was provided with open-ended action picture stimuli and initially formed sentences with an average of 6.1 content words on average. After SLP intervention which included shaping and modeling, the pt was able to generate an average of 7.8 content words per sentence. SLP noted that pt benefited from viewing each image for 20 seconds before saying her sentence to the SLP  in order to create longer sentences.      Assessment / Recommendations / Plan   Plan Continue with current plan of care      Progression Toward Goals   Progression toward goals Progressing toward goals                SLP Short Term  Goals - 03/25/21 0913       SLP SHORT TERM GOAL #1   Title Pt will increase amount of content words per sentence by (2+) using Response Elaboration Training (RET) to expand verbal utterances in conversation given prompting across 3 sessions.    Time 2    Period Weeks    Status On-going   Not met on 9/21 to cont   Target Date 04/15/21              SLP Long Term Goals - 03/25/21 0917       SLP LONG TERM GOAL #1   Title Pt will participate in Response Elaboration Training to increase overall verbal fluency in conversation as reported by pt/pt family.    Time 4    Period Weeks    Status On-going   Not me on 9/21 to cont.   Target Date 04/29/21              Plan - 03/25/21 0905     Clinical Impression Statement See tx note. Pt reported less communicative  breakdowns in iADLs. Pt reports success with using aphasia card at pharmacy. Pt increased number of content words by 3 this session. Cont with current POC.    Speech Therapy Frequency 1x /week    Duration 4 weeks    Treatment/Interventions Cognitive reorganization;Multimodal communcation approach;Language facilitation;Compensatory techniques;Internal/external aids;SLP instruction and feedback;Environmental controls;Patient/family education;Functional tasks;Cueing hierarchy    Potential to Achieve Goals Good    Potential Considerations Ability to learn/carryover information    Consulted and Agree with Plan of Care Patient;Family member/caregiver    Family Member Consulted Husband             Patient will benefit from skilled therapeutic intervention in order to improve the following deficits and impairments:   No diagnosis found.    Problem List Patient Active Problem List   Diagnosis Date Noted   Colon cancer screening 12/17/2020   Cancer screening 12/17/2020   Encounter for screening mammogram for breast cancer 12/09/2020   Acute ischemic stroke ([place]) 09/25/2020   History of ischemic left MCA stroke s/p tPA 06/06/2020   History of repair of congenital atrial septal defect (ASD) 06/06/2020   Hyperlipidemia LDL goal <70 06/06/2020   Received intravenous tissue plasminogen activator (tPA) in emergency department    Hypothyroid 12/22/2011   Stress incontinence 12/22/2011   Family history of kidney disease 12/22/2011   Encounter for routine gynecological examination 12/22/2011   Routine general medical examination at a health care facility 12/19/2011    [facility] Communication Sciences and Disorders  03/25/2021, 9:19 AM  [place]. [place], [place], [address] Phone: [phone]   Fax:  [phone]   Name: [name] MRN: [id number] Date of Birth: 08/15/1970

## 2021-04-02 ENCOUNTER — Other Ambulatory Visit: Payer: Self-pay

## 2021-04-02 ENCOUNTER — Ambulatory Visit: Payer: BC Managed Care – PPO | Admitting: Speech Pathology

## 2021-04-02 ENCOUNTER — Encounter: Payer: Self-pay | Admitting: Speech Pathology

## 2021-04-02 DIAGNOSIS — R4701 Aphasia: Secondary | ICD-10-CM

## 2021-04-02 NOTE — Therapy (Deleted)
[place]. [place], [place], [address] Phone: [phone]   Fax:  [phone]  Speech Language Pathology Treatment  Patient Details  Name: [name] MRN: [id number] Date of Birth: Nov 22, 1970 Referring Provider (SLP): Phillips Odor MD   Encounter Date: 04/02/2021   End of Session - 04/02/21 0903     Visit Number 35    Number of Visits 120    Date for SLP Re-Evaluation 04/29/21    Authorization Time Period 04/29/21    Authorization - Visit Number 1    Authorization - Number of Visits 4    SLP Start Time 0849    SLP Stop Time  0929    SLP Time Calculation (min) 40 min    Activity Tolerance Patient tolerated treatment well             Past Medical History:  Diagnosis Date   Hx of migraines    Stroke ([place])    06/03/20    Past Surgical History:  Procedure Laterality Date   BUBBLE STUDY  03/13/2020   Procedure: BUBBLE STUDY;  Surgeon: Sueanne Margarita, MD;  Location: [facility];  Service: Cardiovascular;;   ENDOMETRIAL ABLATION     Uterine/heavy menses   IR ANGIO INTRA EXTRACRAN SEL COM CAROTID INNOMINATE BILAT MOD SED  06/06/2020   IR ANGIO VERTEBRAL SEL VERTEBRAL BILAT MOD SED  06/06/2020   IR CT HEAD LTD  09/25/2020   IR PERCUTANEOUS ART THROMBECTOMY/INFUSION INTRACRANIAL INC DIAG ANGIO  09/25/2020   IR US GUIDE VASC ACCESS RIGHT  06/06/2020   RADIOLOGY WITH ANESTHESIA N/A 09/25/2020   Procedure: RADIOLOGY WITH ANESTHESIA;  Surgeon: Radiologist, Medication, MD;  Location: [facility];  Service: Radiology;  Laterality: N/A;   SHOULDER SURGERY     Left,/bone spurs   TEE WITHOUT CARDIOVERSION N/A 03/13/2020   Procedure: TRANSESOPHAGEAL ECHOCARDIOGRAM (TEE);  Surgeon: Sueanne Margarita, MD;  Location: [facility] ENDOSCOPY;  Service: Cardiovascular;  Laterality: N/A;    There were no vitals filed for this visit.   Subjective Assessment - 04/02/21 0902     Subjective Pt shared about her weekend with occasional delay  in word finding.    Currently in Pain? No/denies                   ADULT SLP TREATMENT - 04/02/21 0908       General Information   Behavior/Cognition Alert;Cooperative      Treatment Provided   Treatment provided Cognitive-Linquistic      Cognitive-Linquistic Treatment   Treatment focused on Aphasia    Skilled Treatment SLP facilitated  verbal expression by completing RET. Independently, pt was able to generate * content words. With modeling and shaping of responses, pt was able to increase number of content words to * per sentence. Pt benefited from extra time prior to generating sentence.      Assessment / Recommendations / Plan   Plan Continue with current plan of care      Progression Toward Goals   Progression toward goals Progressing toward goals                SLP Short Term Goals - 03/25/21 0913       SLP SHORT TERM GOAL #1   Title Pt will increase amount of content words per sentence by (2+) using Response Elaboration Training (RET) to expand verbal utterances in conversation given prompting across 3 sessions.    Time 2    Period Weeks  Status On-going   Not met on 9/21 to cont   Target Date 04/15/21              SLP Long Term Goals - 03/25/21 0917       SLP LONG TERM GOAL #1   Title Pt will participate in Response Elaboration Training to increase overall verbal fluency in conversation as reported by pt/pt family.    Time 4    Period Weeks    Status On-going   Not me on 9/21 to cont.   Target Date 04/29/21              Plan - 04/02/21 2026     Clinical Impression Statement See tx note. Pt benefits from extra time prior to generating sentences. Cont. with current POC.    Speech Therapy Frequency 1x /week    Duration 4 weeks    Treatment/Interventions Cognitive reorganization;Multimodal communcation approach;Language facilitation;Compensatory techniques;Internal/external aids;SLP instruction and feedback;Environmental  controls;Patient/family education;Functional tasks;Cueing hierarchy    Potential to Achieve Goals Good    Potential Considerations Ability to learn/carryover information    Consulted and Agree with Plan of Care Patient;Family member/caregiver    Family Member Consulted Husband             Patient will benefit from skilled therapeutic intervention in order to improve the following deficits and impairments:   Aphasia    Problem List Patient Active Problem List   Diagnosis Date Noted   Colon cancer screening 12/17/2020   Cancer screening 12/17/2020   Encounter for screening mammogram for breast cancer 12/09/2020   Acute ischemic stroke ([place]) 09/25/2020   History of ischemic left MCA stroke s/p tPA 06/06/2020   History of repair of congenital atrial septal defect (ASD) 06/06/2020   Hyperlipidemia LDL goal <70 06/06/2020   Received intravenous tissue plasminogen activator (tPA) in emergency department    Hypothyroid 12/22/2011   Stress incontinence 12/22/2011   Family history of kidney disease 12/22/2011   Encounter for routine gynecological examination 12/22/2011   Routine general medical examination at a health care facility 12/19/2011    [place], [place], [facility]  04/02/2021, 9:13 AM  [place]. [place], [place], [address] Phone: [phone]   Fax:  [phone]   Name: [name] MRN: [id number] Date of Birth: 1970/09/24

## 2021-04-02 NOTE — Therapy (Signed)
[place]. [place], [place], [address] Phone: [phone]   Fax:  [phone]  Speech Language Pathology Treatment  Patient Details  Name: [name] MRN: [id number] Date of Birth: 04/22/1971 Referring Provider (SLP): Phillips Odor MD   Encounter Date: 04/02/2021   End of Session - 04/02/21 0903     Visit Number 35    Number of Visits 120    Date for SLP Re-Evaluation 04/29/21    Authorization Time Period 04/29/21    Authorization - Visit Number 1    Authorization - Number of Visits 4    SLP Start Time 0849    SLP Stop Time  0929    SLP Time Calculation (min) 40 min    Activity Tolerance Patient tolerated treatment well             Past Medical History:  Diagnosis Date   Hx of migraines    Stroke ([place])    06/03/20    Past Surgical History:  Procedure Laterality Date   BUBBLE STUDY  03/13/2020   Procedure: BUBBLE STUDY;  Surgeon: Sueanne Margarita, MD;  Location: [facility];  Service: Cardiovascular;;   ENDOMETRIAL ABLATION     Uterine/heavy menses   IR ANGIO INTRA EXTRACRAN SEL COM CAROTID INNOMINATE BILAT MOD SED  06/06/2020   IR ANGIO VERTEBRAL SEL VERTEBRAL BILAT MOD SED  06/06/2020   IR CT HEAD LTD  09/25/2020   IR PERCUTANEOUS ART THROMBECTOMY/INFUSION INTRACRANIAL INC DIAG ANGIO  09/25/2020   IR US GUIDE VASC ACCESS RIGHT  06/06/2020   RADIOLOGY WITH ANESTHESIA N/A 09/25/2020   Procedure: RADIOLOGY WITH ANESTHESIA;  Surgeon: Radiologist, Medication, MD;  Location: [facility];  Service: Radiology;  Laterality: N/A;   SHOULDER SURGERY     Left,/bone spurs   TEE WITHOUT CARDIOVERSION N/A 03/13/2020   Procedure: TRANSESOPHAGEAL ECHOCARDIOGRAM (TEE);  Surgeon: Sueanne Margarita, MD;  Location: [facility] ENDOSCOPY;  Service: Cardiovascular;  Laterality: N/A;    There were no vitals filed for this visit.   Subjective Assessment - 04/02/21 0902     Subjective Pt shared about her weekend with occasional delay  in word finding.    Currently in Pain? No/denies                   ADULT SLP TREATMENT - 04/02/21 0908       General Information   Behavior/Cognition Alert;Cooperative      Treatment Provided   Treatment provided Cognitive-Linquistic      Cognitive-Linquistic Treatment   Treatment focused on Aphasia    Skilled Treatment SLP facilitated  verbal expression by completing RET. Independently, pt was able to generate 5.9 content words. With modeling and shaping of responses, pt was able to increase number of content words to 6.8 per sentence. Pt benefited from extra time prior to generating sentence.      Assessment / Recommendations / Plan   Plan Continue with current plan of care      Progression Toward Goals   Progression toward goals Progressing toward goals                SLP Short Term Goals - 03/25/21 0913       SLP SHORT TERM GOAL #1   Title Pt will increase amount of content words per sentence by (2+) using Response Elaboration Training (RET) to expand verbal utterances in conversation given prompting across 3 sessions.    Time 2    Period Weeks  Status On-going   Not met on 9/21 to cont   Target Date 04/15/21              SLP Long Term Goals - 03/25/21 0917       SLP LONG TERM GOAL #1   Title Pt will participate in Response Elaboration Training to increase overall verbal fluency in conversation as reported by pt/pt family.    Time 4    Period Weeks    Status On-going   Not me on 9/21 to cont.   Target Date 04/29/21              Plan - 04/02/21 2035     Clinical Impression Statement See tx note. Pt benefits from extra time prior to generating sentences. Cont. with current POC.    Speech Therapy Frequency 1x /week    Duration 4 weeks    Treatment/Interventions Cognitive reorganization;Multimodal communcation approach;Language facilitation;Compensatory techniques;Internal/external aids;SLP instruction and feedback;Environmental  controls;Patient/family education;Functional tasks;Cueing hierarchy    Potential to Achieve Goals Good    Potential Considerations Ability to learn/carryover information    Consulted and Agree with Plan of Care Patient;Family member/caregiver    Family Member Consulted Husband             Patient will benefit from skilled therapeutic intervention in order to improve the following deficits and impairments:   Aphasia    Problem List Patient Active Problem List   Diagnosis Date Noted   Colon cancer screening 12/17/2020   Cancer screening 12/17/2020   Encounter for screening mammogram for breast cancer 12/09/2020   Acute ischemic stroke ([place]) 09/25/2020   History of ischemic left MCA stroke s/p tPA 06/06/2020   History of repair of congenital atrial septal defect (ASD) 06/06/2020   Hyperlipidemia LDL goal <70 06/06/2020   Received intravenous tissue plasminogen activator (tPA) in emergency department    Hypothyroid 12/22/2011   Stress incontinence 12/22/2011   Family history of kidney disease 12/22/2011   Encounter for routine gynecological examination 12/22/2011   Routine general medical examination at a health care facility 12/19/2011    [facility]  04/02/2021, 10:41 AM  [place]. [place], [place], [address] Phone: [phone]   Fax:  [phone]   Name: [name] MRN: [id number] Date of Birth: 20-Dec-1970

## 2021-04-06 DIAGNOSIS — Q211 Atrial septal defect, unspecified: Secondary | ICD-10-CM | POA: Diagnosis not present

## 2021-04-06 DIAGNOSIS — Z79899 Other long term (current) drug therapy: Secondary | ICD-10-CM | POA: Diagnosis not present

## 2021-04-06 DIAGNOSIS — Z01810 Encounter for preprocedural cardiovascular examination: Secondary | ICD-10-CM | POA: Diagnosis not present

## 2021-04-06 DIAGNOSIS — Z8673 Personal history of transient ischemic attack (TIA), and cerebral infarction without residual deficits: Secondary | ICD-10-CM | POA: Diagnosis not present

## 2021-04-06 DIAGNOSIS — I280 Arteriovenous fistula of pulmonary vessels: Secondary | ICD-10-CM | POA: Diagnosis not present

## 2021-04-06 DIAGNOSIS — Z20822 Contact with and (suspected) exposure to covid-19: Secondary | ICD-10-CM | POA: Diagnosis not present

## 2021-04-13 ENCOUNTER — Encounter: Payer: Self-pay | Admitting: Adult Health

## 2021-04-14 ENCOUNTER — Other Ambulatory Visit: Payer: Self-pay

## 2021-04-14 ENCOUNTER — Ambulatory Visit: Payer: BC Managed Care – PPO | Attending: Neurology | Admitting: Speech Pathology

## 2021-04-14 ENCOUNTER — Encounter: Payer: Self-pay | Admitting: Speech Pathology

## 2021-04-14 DIAGNOSIS — R4701 Aphasia: Secondary | ICD-10-CM | POA: Insufficient documentation

## 2021-04-14 NOTE — Therapy (Signed)
[place]. [place], [place], [address] Phone: [phone]   Fax:  [phone]  Speech Language Pathology Treatment  Patient Details  Name: [name] MRN: [id number] Date of Birth: [birth year] Referring Provider (SLP): Phillips Odor MD   Encounter Date: 04/14/2021   End of Session - 04/14/21 0913     Visit Number 36    Number of Visits 120    Date for SLP Re-Evaluation 04/29/21    Authorization Time Period 04/29/21    Authorization - Visit Number 2    Authorization - Number of Visits 4    SLP Start Time 0845    SLP Stop Time  0930    SLP Time Calculation (min) 45 min    Activity Tolerance Patient tolerated treatment well             Past Medical History:  Diagnosis Date   Hx of migraines    Stroke ([place])    06/03/20    Past Surgical History:  Procedure Laterality Date   BUBBLE STUDY  03/13/2020   Procedure: BUBBLE STUDY;  Surgeon: Sueanne Margarita, MD;  Location: [facility];  Service: Cardiovascular;;   ENDOMETRIAL ABLATION     Uterine/heavy menses   IR ANGIO INTRA EXTRACRAN SEL COM CAROTID INNOMINATE BILAT MOD SED  06/06/2020   IR ANGIO VERTEBRAL SEL VERTEBRAL BILAT MOD SED  06/06/2020   IR CT HEAD LTD  09/25/2020   IR PERCUTANEOUS ART THROMBECTOMY/INFUSION INTRACRANIAL INC DIAG ANGIO  09/25/2020   IR US GUIDE VASC ACCESS RIGHT  06/06/2020   RADIOLOGY WITH ANESTHESIA N/A 09/25/2020   Procedure: RADIOLOGY WITH ANESTHESIA;  Surgeon: Radiologist, Medication, MD;  Location: [facility];  Service: Radiology;  Laterality: N/A;   SHOULDER SURGERY     Left,/bone spurs   TEE WITHOUT CARDIOVERSION N/A 03/13/2020   Procedure: TRANSESOPHAGEAL ECHOCARDIOGRAM (TEE);  Surgeon: Sueanne Margarita, MD;  Location: [facility] ENDOSCOPY;  Service: Cardiovascular;  Laterality: N/A;    There were no vitals filed for this visit.   Subjective Assessment - 04/14/21 0910     Subjective Pt reports that her OP procedure went well.     Patient is accompained by: Family member    Currently in Pain? No/denies                   ADULT SLP TREATMENT - 04/14/21 1208       General Information   Behavior/Cognition Alert;Cooperative      Treatment Provided   Treatment provided Cognitive-Linquistic      Cognitive-Linquistic Treatment   Treatment focused on Aphasia    Skilled Treatment SLP facilitated increased word retrieval by reviewing Tactus word finding strategies. Pt reported she does not use these at home consistently. SLP stressed the importance of consistency with strategy use. Pt chose 2 strategies to trial for homework (describe + look it up). Cont to demonstrate difficulty with reading aloud and reading comprehension. Pt benefited from reading over sentence twice to increase comprehension.      Assessment / Recommendations / Plan   Plan Continue with current plan of care      Progression Toward Goals   Progression toward goals Progressing toward goals                SLP Short Term Goals - 04/14/21 1211       SLP SHORT TERM GOAL #1   Title Pt will increase amount of content words per sentence by (2+) using Response  Elaboration Training (RET) to expand verbal utterances in conversation given prompting across 3 sessions.    Time 2    Period Weeks    Status On-going   Not met on 9/21 to cont   Target Date 04/15/21      SLP SHORT TERM GOAL #2   Title Pt/husband will report use of 1+ word finding strategy to decrease instances of anomia and conversational breakdowns.    Time 2    Period Weeks    Status New              SLP Long Term Goals - 03/25/21 0917       SLP LONG TERM GOAL #1   Title Pt will participate in Response Elaboration Training to increase overall verbal fluency in conversation as reported by pt/pt family.    Time 4    Period Weeks    Status On-going   Not me on 9/21 to cont.   Target Date 04/29/21              Plan - 04/14/21 0917     Clinical Impression  Statement See tx note. Pt reports that she will attempt to trial 2 diffferent word finding strategies for HEP. Cont. with current POC.    Speech Therapy Frequency 1x /week    Duration 4 weeks    Treatment/Interventions Cognitive reorganization;Multimodal communcation approach;Language facilitation;Compensatory techniques;Internal/external aids;SLP instruction and feedback;Environmental controls;Patient/family education;Functional tasks;Cueing hierarchy    Potential to Achieve Goals Good    Potential Considerations Ability to learn/carryover information    Consulted and Agree with Plan of Care Patient;Family member/caregiver    Family Member Consulted Husband             Patient will benefit from skilled therapeutic intervention in order to improve the following deficits and impairments:   Aphasia    Problem List Patient Active Problem List   Diagnosis Date Noted   Colon cancer screening 12/17/2020   Cancer screening 12/17/2020   Encounter for screening mammogram for breast cancer 12/09/2020   Acute ischemic stroke ([place]) 09/25/2020   History of ischemic left MCA stroke s/p tPA 06/06/2020   History of repair of congenital atrial septal defect (ASD) 06/06/2020   Hyperlipidemia LDL goal <70 06/06/2020   Received intravenous tissue plasminogen activator (tPA) in emergency department    Hypothyroid 12/22/2011   Stress incontinence 12/22/2011   Family history of kidney disease 12/22/2011   Encounter for routine gynecological examination 12/22/2011   Routine general medical examination at a health care facility 12/19/2011    [place], [place], [facility]  04/14/2021, 12:13 PM  [place]. [place], [place], [address] Phone: [phone]   Fax:  [phone]   Name: [name] MRN: [id number] Date of Birth: 1971/05/31

## 2021-04-20 ENCOUNTER — Telehealth: Payer: Self-pay | Admitting: *Deleted

## 2021-04-20 ENCOUNTER — Ambulatory Visit (INDEPENDENT_AMBULATORY_CARE_PROVIDER_SITE_OTHER): Payer: BC Managed Care – PPO

## 2021-04-20 DIAGNOSIS — I639 Cerebral infarction, unspecified: Secondary | ICD-10-CM | POA: Diagnosis not present

## 2021-04-20 DIAGNOSIS — Z0289 Encounter for other administrative examinations: Secondary | ICD-10-CM

## 2021-04-20 NOTE — Telephone Encounter (Signed)
Patient has sent my charts re: disability papers. She brought papers today, placed on NP desk for review.

## 2021-04-21 LAB — CUP PACEART REMOTE DEVICE CHECK
Date Time Interrogation Session: 20221013084218
Implantable Pulse Generator Implant Date: 20211116

## 2021-04-21 NOTE — Telephone Encounter (Signed)
Can be completed. Thank you

## 2021-04-22 ENCOUNTER — Encounter: Payer: Self-pay | Admitting: *Deleted

## 2021-04-22 NOTE — Telephone Encounter (Signed)
Disability papers on NP desk for completion, signature.

## 2021-04-22 NOTE — Telephone Encounter (Signed)
Disability papers completed, signed and sent to medical records for processing.

## 2021-04-22 NOTE — Telephone Encounter (Signed)
There are no physical deficits - able to be physically active as tolerated. Post stroke deficits are more in regards to speech. No changes to page 12 or restrictions. Thank you

## 2021-04-23 ENCOUNTER — Telehealth: Payer: Self-pay | Admitting: *Deleted

## 2021-04-23 NOTE — Telephone Encounter (Signed)
I faxed pt standard insurance to [phone]

## 2021-04-24 ENCOUNTER — Ambulatory Visit: Payer: BC Managed Care – PPO | Admitting: Speech Pathology

## 2021-04-29 NOTE — Progress Notes (Signed)
Carelink Summary Report / Loop Recorder 

## 2021-05-01 ENCOUNTER — Encounter: Payer: Self-pay | Admitting: Speech Pathology

## 2021-05-01 ENCOUNTER — Ambulatory Visit: Payer: BC Managed Care – PPO | Admitting: Speech Pathology

## 2021-05-01 ENCOUNTER — Other Ambulatory Visit: Payer: Self-pay

## 2021-05-01 DIAGNOSIS — R4701 Aphasia: Secondary | ICD-10-CM | POA: Diagnosis not present

## 2021-05-01 NOTE — Therapy (Signed)
[place]. [place], [place], [address] Phone: [phone]   Fax:  [phone]  Speech Language Pathology Treatment & Recertification  Patient Details  Name: [name] MRN: [id number] Date of Birth: 1971-02-06 Referring Provider (SLP): Phillips Odor MD   Encounter Date: 05/01/2021   End of Session - 05/01/21 0853     Visit Number 37    Number of Visits 120    Date for SLP Re-Evaluation 05/29/21    Authorization Time Period 05/29/21    Authorization - Visit Number 0    Authorization - Number of Visits 4    SLP Start Time 0847    SLP Stop Time  0925    SLP Time Calculation (min) 38 min    Activity Tolerance Patient tolerated treatment well             Past Medical History:  Diagnosis Date   Hx of migraines    Stroke ([place])    06/03/20    Past Surgical History:  Procedure Laterality Date   BUBBLE STUDY  03/13/2020   Procedure: BUBBLE STUDY;  Surgeon: Sueanne Margarita, MD;  Location: [facility];  Service: Cardiovascular;;   ENDOMETRIAL ABLATION     Uterine/heavy menses   IR ANGIO INTRA EXTRACRAN SEL COM CAROTID INNOMINATE BILAT MOD SED  06/06/2020   IR ANGIO VERTEBRAL SEL VERTEBRAL BILAT MOD SED  06/06/2020   IR CT HEAD LTD  09/25/2020   IR PERCUTANEOUS ART THROMBECTOMY/INFUSION INTRACRANIAL INC DIAG ANGIO  09/25/2020   IR US GUIDE VASC ACCESS RIGHT  06/06/2020   RADIOLOGY WITH ANESTHESIA N/A 09/25/2020   Procedure: RADIOLOGY WITH ANESTHESIA;  Surgeon: Radiologist, Medication, MD;  Location: [place];  Service: Radiology;  Laterality: N/A;   SHOULDER SURGERY     Left,/bone spurs   TEE WITHOUT CARDIOVERSION N/A 03/13/2020   Procedure: TRANSESOPHAGEAL ECHOCARDIOGRAM (TEE);  Surgeon: Sueanne Margarita, MD;  Location: [facility] ENDOSCOPY;  Service: Cardiovascular;  Laterality: N/A;    There were no vitals filed for this visit.   Subjective Assessment - 05/01/21 0851     Subjective Pt reports she has been making  wreaths.    Currently in Pain? No/denies                   ADULT SLP TREATMENT - 05/01/21 0924       General Information   Behavior/Cognition Alert;Cooperative      Treatment Provided   Treatment provided Cognitive-Linquistic      Cognitive-Linquistic Treatment   Treatment focused on Aphasia    Skilled Treatment SLP facilitated increased word retrieval by reviewing Tactus word finding strategies. SLP and pt practiced using different word finding strategies using multimodal communication (gestures, verbal). Pt required verbal minA to provide "group" to narrow down the idea when describing object to therapist.      Assessment / Recommendations / [place] with current plan of care                SLP Short Term Goals - 05/01/21 0857       SLP SHORT TERM GOAL #1   Title Pt will increase amount of content words per sentence by (2+) using Response Elaboration Training (RET) to expand verbal utterances in conversation given prompting across 3 sessions.    Time 2    Period Weeks    Status Achieved   Not met on 9/21 to cont   Target Date 04/15/21  SLP SHORT TERM GOAL #2   Title Pt/husband will report use of 1+ word finding strategy to decrease instances of anomia and conversational breakdowns.    Time 2    Period Weeks    Status On-going    Target Date 06/01/21              SLP Long Term Goals - 05/01/21 0857       SLP LONG TERM GOAL #1   Title Pt will participate in Response Elaboration Training to increase overall verbal fluency in conversation as reported by pt/pt family.    Time 4    Period Weeks    Status Achieved   Not me on 9/21 to cont.     SLP LONG TERM GOAL #2   Title Pt/husband will report use of 2+ word finding strategy to decrease instances of anomia and conversational breakdowns.    Time 4    Period Weeks    Status New    Target Date 05/29/21              Plan - 05/01/21 0854     Clinical Impression Statement See  tx note. Pt to be recertified for additional 4 visits. She was unable to complete 2 due to illness. Cont with current POC.    Speech Therapy Frequency 1x /week    Duration 4 weeks    Treatment/Interventions Cognitive reorganization;Multimodal communcation approach;Language facilitation;Compensatory techniques;Internal/external aids;SLP instruction and feedback;Environmental controls;Patient/family education;Functional tasks;Cueing hierarchy    Potential to Achieve Goals Good    Potential Considerations Ability to learn/carryover information    Consulted and Agree with Plan of Care Patient;Family member/caregiver    Family Member Consulted Husband             Patient will benefit from skilled therapeutic intervention in order to improve the following deficits and impairments:   Aphasia    Problem List Patient Active Problem List   Diagnosis Date Noted   Colon cancer screening 12/17/2020   Cancer screening 12/17/2020   Encounter for screening mammogram for breast cancer 12/09/2020   Acute ischemic stroke ([place]) 09/25/2020   History of ischemic left MCA stroke s/p tPA 06/06/2020   History of repair of congenital atrial septal defect (ASD) 06/06/2020   Hyperlipidemia LDL goal <70 06/06/2020   Received intravenous tissue plasminogen activator (tPA) in emergency department    Hypothyroid 12/22/2011   Stress incontinence 12/22/2011   Family history of kidney disease 12/22/2011   Encounter for routine gynecological examination 12/22/2011   Routine general medical examination at a health care facility 12/19/2011    [place], [place], [facility]  05/01/2021, 10:57 AM  [place]. [place], [place], [address] Phone: [phone]   Fax:  [phone]   Name: [name] MRN: [id number] Date of Birth: 02-22-1971

## 2021-05-11 ENCOUNTER — Encounter: Payer: Self-pay | Admitting: Speech Pathology

## 2021-05-11 ENCOUNTER — Ambulatory Visit: Payer: BC Managed Care – PPO | Attending: Neurology | Admitting: Speech Pathology

## 2021-05-11 ENCOUNTER — Other Ambulatory Visit: Payer: Self-pay

## 2021-05-11 DIAGNOSIS — R4701 Aphasia: Secondary | ICD-10-CM | POA: Diagnosis not present

## 2021-05-11 NOTE — Therapy (Signed)
[facility]. [place], [place], [address] Phone: [phone]   Fax:  [phone]  Speech Language Pathology Treatment  Patient Details  Name: [name] MRN: [id number] Date of Birth: Jul 30, 1970 Referring Provider (SLP): Phillips Odor MD   Encounter Date: 05/11/2021   End of Session - 05/11/21 0919     Visit Number 38    Number of Visits 120    Date for SLP Re-Evaluation 05/29/21    Authorization Time Period 05/29/21    Authorization - Visit Number 1    Authorization - Number of Visits 4    SLP Start Time 0810    SLP Stop Time  0845    SLP Time Calculation (min) 35 min    Activity Tolerance Patient tolerated treatment well             Past Medical History:  Diagnosis Date   Hx of migraines    Stroke ([place])    06/03/20    Past Surgical History:  Procedure Laterality Date   BUBBLE STUDY  03/13/2020   Procedure: BUBBLE STUDY;  Surgeon: Sueanne Margarita, MD;  Location: [facility];  Service: Cardiovascular;;   ENDOMETRIAL ABLATION     Uterine/heavy menses   IR ANGIO INTRA EXTRACRAN SEL COM CAROTID INNOMINATE BILAT MOD SED  06/06/2020   IR ANGIO VERTEBRAL SEL VERTEBRAL BILAT MOD SED  06/06/2020   IR CT HEAD LTD  09/25/2020   IR PERCUTANEOUS ART THROMBECTOMY/INFUSION INTRACRANIAL INC DIAG ANGIO  09/25/2020   IR US GUIDE VASC ACCESS RIGHT  06/06/2020   RADIOLOGY WITH ANESTHESIA N/A 09/25/2020   Procedure: RADIOLOGY WITH ANESTHESIA;  Surgeon: Radiologist, Medication, MD;  Location: [facility];  Service: Radiology;  Laterality: N/A;   SHOULDER SURGERY     Left,/bone spurs   TEE WITHOUT CARDIOVERSION N/A 03/13/2020   Procedure: TRANSESOPHAGEAL ECHOCARDIOGRAM (TEE);  Surgeon: Sueanne Margarita, MD;  Location: [facility] ENDOSCOPY;  Service: Cardiovascular;  Laterality: N/A;    There were no vitals filed for this visit.   Subjective Assessment - 05/11/21 0917     Subjective "They (family) have been giving me more time to get  my words out."    Currently in Pain? No/denies                   ADULT SLP TREATMENT - 05/11/21 0947       General Information   Behavior/Cognition Alert;Cooperative;Pleasant mood      Treatment Provided   Treatment provided Cognitive-Linquistic      Cognitive-Linquistic Treatment   Treatment focused on Aphasia    Skilled Treatment SLP reviewed tactus word finding strategies with pt. Pt practiced utilizing "describe" during SFA to facilitate expressive language. She required minA and benefitted from sentence completion cues. SLP instructed pt to also create sentences using objects from task. SLP noted pt required minA verbal cues in order to create grammatically correct sentences.      Assessment / Recommendations / Plan   Plan Continue with current plan of care      Progression Toward Goals   Progression toward goals Progressing toward goals                SLP Short Term Goals - 05/01/21 0857       SLP SHORT TERM GOAL #1   Title Pt will increase amount of content words per sentence by (2+) using Response Elaboration Training (RET) to expand verbal utterances in conversation given prompting across 3 sessions.  Time 2    Period Weeks    Status Achieved   Not met on 9/21 to cont   Target Date 04/15/21      SLP SHORT TERM GOAL #2   Title Pt/husband will report use of 1+ word finding strategy to decrease instances of anomia and conversational breakdowns.    Time 2    Period Weeks    Status On-going    Target Date 06/01/21              SLP Long Term Goals - 05/01/21 0857       SLP LONG TERM GOAL #1   Title Pt will participate in Response Elaboration Training to increase overall verbal fluency in conversation as reported by pt/pt family.    Time 4    Period Weeks    Status Achieved   Not me on 9/21 to cont.     SLP LONG TERM GOAL #2   Title Pt/husband will report use of 2+ word finding strategy to decrease instances of anomia and conversational  breakdowns.    Time 4    Period Weeks    Status New    Target Date 05/29/21              Plan - 05/11/21 0919     Clinical Impression Statement See tx note. Pt continues to demonstrate progress in verbal expression. She reports she has more difficulty with when she is flustered and/or excited. SLP discussed taking a relaxing breath prior to speaking on the (exciting) subject. Cont with current POC.    Speech Therapy Frequency 1x /week    Duration 4 weeks    Treatment/Interventions Cognitive reorganization;Multimodal communcation approach;Language facilitation;Compensatory techniques;Internal/external aids;SLP instruction and feedback;Environmental controls;Patient/family education;Functional tasks;Cueing hierarchy    Potential to Achieve Goals Good    Potential Considerations Ability to learn/carryover information    Consulted and Agree with Plan of Care Patient;Family member/caregiver    Family Member Consulted Husband             Patient will benefit from skilled therapeutic intervention in order to improve the following deficits and impairments:   Aphasia    Problem List Patient Active Problem List   Diagnosis Date Noted   Colon cancer screening 12/17/2020   Cancer screening 12/17/2020   Encounter for screening mammogram for breast cancer 12/09/2020   Acute ischemic stroke ([place]) 09/25/2020   History of ischemic left MCA stroke s/p tPA 06/06/2020   History of repair of congenital atrial septal defect (ASD) 06/06/2020   Hyperlipidemia LDL goal <70 06/06/2020   Received intravenous tissue plasminogen activator (tPA) in emergency department    Hypothyroid 12/22/2011   Stress incontinence 12/22/2011   Family history of kidney disease 12/22/2011   Encounter for routine gynecological examination 12/22/2011   Routine general medical examination at a health care facility 12/19/2011    [facility] Communication Sciences and Disorders  05/11/2021, 9:53 AM  [place]. [place], [place], [address] Phone: [phone]   Fax:  [phone]   Name: [name] MRN: [id number] Date of Birth: 1970-08-04

## 2021-05-25 ENCOUNTER — Ambulatory Visit (INDEPENDENT_AMBULATORY_CARE_PROVIDER_SITE_OTHER): Payer: BC Managed Care – PPO

## 2021-05-25 ENCOUNTER — Other Ambulatory Visit: Payer: Self-pay

## 2021-05-25 ENCOUNTER — Ambulatory Visit: Payer: BC Managed Care – PPO | Admitting: Speech Pathology

## 2021-05-25 DIAGNOSIS — I639 Cerebral infarction, unspecified: Secondary | ICD-10-CM

## 2021-05-25 DIAGNOSIS — R4701 Aphasia: Secondary | ICD-10-CM

## 2021-05-25 NOTE — Therapy (Signed)
[place]. [place], [place], [address] Phone: [phone]   Fax:  [phone]  Speech Language Pathology Treatment  Patient Details  Name: [name] MRN: [id number] Date of Birth: 10-03-1970 Referring Provider (SLP): Phillips Odor MD   Encounter Date: 05/25/2021   End of Session - 05/25/21 1125     Visit Number 39    Number of Visits 120    Date for SLP Re-Evaluation 05/29/21    Authorization Time Period 05/29/21    Authorization - Visit Number 2    Authorization - Number of Visits 4    SLP Start Time 0800    SLP Stop Time  0845    SLP Time Calculation (min) 45 min    Activity Tolerance Patient tolerated treatment well             Past Medical History:  Diagnosis Date   Hx of migraines    Stroke ([place])    06/03/20    Past Surgical History:  Procedure Laterality Date   BUBBLE STUDY  03/13/2020   Procedure: BUBBLE STUDY;  Surgeon: Sueanne Margarita, MD;  Location: [place] [facility];  Service: Cardiovascular;;   ENDOMETRIAL ABLATION     Uterine/heavy menses   IR ANGIO INTRA EXTRACRAN SEL COM CAROTID INNOMINATE BILAT MOD SED  06/06/2020   IR ANGIO VERTEBRAL SEL VERTEBRAL BILAT MOD SED  06/06/2020   IR CT HEAD LTD  09/25/2020   IR PERCUTANEOUS ART THROMBECTOMY/INFUSION INTRACRANIAL INC DIAG ANGIO  09/25/2020   IR US GUIDE VASC ACCESS RIGHT  06/06/2020   RADIOLOGY WITH ANESTHESIA N/A 09/25/2020   Procedure: RADIOLOGY WITH ANESTHESIA;  Surgeon: Radiologist, Medication, MD;  Location: [facility];  Service: Radiology;  Laterality: N/A;   SHOULDER SURGERY     Left,/bone spurs   TEE WITHOUT CARDIOVERSION N/A 03/13/2020   Procedure: TRANSESOPHAGEAL ECHOCARDIOGRAM (TEE);  Surgeon: Sueanne Margarita, MD;  Location: [facility] ENDOSCOPY;  Service: Cardiovascular;  Laterality: N/A;    There were no vitals filed for this visit.          ADULT SLP TREATMENT - 05/25/21 1150       General Information   Behavior/Cognition  Alert;Cooperative;Pleasant mood      Treatment Provided   Treatment provided Cognitive-Linquistic      Cognitive-Linquistic Treatment   Treatment focused on Aphasia    Skilled Treatment SLP and pt participated in 20 minute conversation. Pt exhibited few instances of anomia and implemented "describe" strategy in conversation to describe objects when word finding difficulty occurred. Pt was instructed to write down a meal recipe ingredients and the steps to complete meal for Thanksgiving. Pt exhibited no errors with spelling. SLP observed pt required extra time to write ingredients and steps for recipe.      Assessment / Recommendations / Plan   Plan Continue with current plan of care      Progression Toward Goals   Progression toward goals Progressing toward goals                SLP Short Term Goals - 05/25/21 1126       SLP SHORT TERM GOAL #1   Title Pt will increase amount of content words per sentence by (2+) using Response Elaboration Training (RET) to expand verbal utterances in conversation given prompting across 3 sessions.    Time 2    Period Weeks    Status Achieved   Not met on 9/21 to cont   Target Date 04/15/21  SLP SHORT TERM GOAL #2   Title Pt/husband will report use of 1+ word finding strategy to decrease instances of anomia and conversational breakdowns.    Time 2    Period Weeks    Status Achieved    Target Date 06/01/21              SLP Long Term Goals - 05/25/21 1127       SLP LONG TERM GOAL #1   Title Pt will participate in Response Elaboration Training to increase overall verbal fluency in conversation as reported by pt/pt family.    Time 4    Period Weeks    Status Achieved   Not me on 9/21 to cont.     SLP LONG TERM GOAL #2   Title Pt/husband will report use of 2+ word finding strategy to decrease instances of anomia and conversational breakdowns.    Time 4    Period Weeks    Status Achieved              Plan - 05/25/21 1125      Clinical Impression Statement See tx note. Pt continues to demonstrate progress in verbal expression. She reports she has more difficulty with when she is flustered and/or excited. SLP discussed taking a relaxing breath prior to speaking on the (exciting) subject. Cont with current POC. **Discussed shifting therapy to x2/month (biweekly); to recert pt for additional time next session. Provided pt with L!V questionnaire to identify areas that she feels she would like to work towards in therapy.    Speech Therapy Frequency Biweekly    Duration 4 weeks    Treatment/Interventions Cognitive reorganization;Multimodal communcation approach;Language facilitation;Compensatory techniques;Internal/external aids;SLP instruction and feedback;Environmental controls;Patient/family education;Functional tasks;Cueing hierarchy    Potential to Achieve Goals Good    Potential Considerations Ability to learn/carryover information    Consulted and Agree with Plan of Care Patient;Family member/caregiver    Family Member Consulted Husband             Patient will benefit from skilled therapeutic intervention in order to improve the following deficits and impairments:   Aphasia    Problem List Patient Active Problem List   Diagnosis Date Noted   Colon cancer screening 12/17/2020   Cancer screening 12/17/2020   Encounter for screening mammogram for breast cancer 12/09/2020   Acute ischemic stroke ([place]) 09/25/2020   History of ischemic left MCA stroke s/p tPA 06/06/2020   History of repair of congenital atrial septal defect (ASD) 06/06/2020   Hyperlipidemia LDL goal <70 06/06/2020   Received intravenous tissue plasminogen activator (tPA) in emergency department    Hypothyroid 12/22/2011   Stress incontinence 12/22/2011   Family history of kidney disease 12/22/2011   Encounter for routine gynecological examination 12/22/2011   Routine general medical examination at a health care facility 12/19/2011     [facility], Student-SLP B.S. Communication Sciences and Disorders  05/25/2021, 11:54 AM  [place]. [place], [place], [address] Phone: [phone]   Fax:  [phone]   Name: [name] MRN: [id number] Date of Birth: 23-Mar-1971

## 2021-05-26 LAB — CUP PACEART REMOTE DEVICE CHECK
Date Time Interrogation Session: 20221115084547
Implantable Pulse Generator Implant Date: 20211116

## 2021-06-03 NOTE — Progress Notes (Signed)
Carelink Summary Report / Loop Recorder 

## 2021-06-10 ENCOUNTER — Ambulatory Visit: Payer: BC Managed Care – PPO | Admitting: Adult Health

## 2021-06-15 ENCOUNTER — Other Ambulatory Visit: Payer: Self-pay

## 2021-06-15 ENCOUNTER — Encounter: Payer: Self-pay | Admitting: Adult Health

## 2021-06-15 ENCOUNTER — Ambulatory Visit (INDEPENDENT_AMBULATORY_CARE_PROVIDER_SITE_OTHER): Payer: BC Managed Care – PPO | Admitting: Adult Health

## 2021-06-15 VITALS — BP 114/67 | HR 76 | Ht 64.0 in | Wt 190.0 lb

## 2021-06-15 DIAGNOSIS — E785 Hyperlipidemia, unspecified: Secondary | ICD-10-CM

## 2021-06-15 DIAGNOSIS — I69320 Aphasia following cerebral infarction: Secondary | ICD-10-CM | POA: Diagnosis not present

## 2021-06-15 DIAGNOSIS — I639 Cerebral infarction, unspecified: Secondary | ICD-10-CM | POA: Diagnosis not present

## 2021-06-15 NOTE — Patient Instructions (Signed)
Continue clopidogrel 75 mg daily  and atorvastatin  for secondary stroke prevention  Continue to follow up with PCP regarding cholesterol and blood pressure management  Maintain strict control of hypertension with blood pressure goal below 130/90 and cholesterol with LDL cholesterol (bad cholesterol) goal below 70 mg/dL.   We will check cholesterol levels today  Continue working with speech therapy - we will continue to help with disability      Followup in the future with me in 6 months or call earlier if needed       Thank you for coming to see [place] at [facility]. I hope we have been able to provide you high quality care today.  You may receive a patient satisfaction survey over the next few weeks. We would appreciate your feedback and comments so that we may continue to improve ourselves and the health of our patients.

## 2021-06-15 NOTE — Progress Notes (Signed)
[facility] [address] [place]. [place] [address] [phone]       STROKE FOLLOW UP NOTE  Ms. [name] Date of Birth:  06-30-1971 Medical Record Number:  [id number]   Reason for Referral: Recurrent ESUS    SUBJECTIVE:   CHIEF COMPLAINT:  Chief Complaint  Patient presents with   Follow-up    RM 3 alone Pt is well and stable, still doing SLP. No new concerns      HPI:   Update 06/15/2021 JM: Returns for 49-month stroke follow-up unaccompanied   Overall stable -denies new or worsening stroke/TIA symptoms Residual aphasia with gradual improvement since prior visit but reports she was told by SLP she has likely plateaued at this time.  Currently working with SLP 2x/month due to financial reasons. Also reports difficulty with reading and comprehension, difficulty spelling correctly and difficulty with writing and typing.  Remains on long-term disability.  She is currently pursuing Social Security disability.  Able to maintain ADLs independently and able to drive short distances. Prior concerns of worsening aphasia over a 4 hr duration - repeat MR brain no acute/new abnormalities  Compliant on Plavix and atorvastatin -denies side effects Blood pressure today 114/67 Loop recorder has not shown atrial fibrillation thus far  Continues to follow with [facility] neurology - f/u visit scheduled 1/6 Completed cardiac cath 04/06/2021 thru [facility] - per pt, unremarkable -unable to view results via epic  No further concerns at this time      History provided for reference purposes only Update 02/03/2021 JM: Ms. [name] is being seen for 49-month stroke follow-up accompanied by her husband.  She reports having an episode approximately 3 weeks ago with difficulty completing sentences that lasted approximately 4 hours.  She did not seek medical attention at that time but per husband, speech has been hoarse since that time.  Denies any other associated symptoms such as  confusion or cognitive changes, visual changes, weakness/numbness, balance difficulties or headache.  She was seen by [facility] neurology yesterday who placed order for MR brain. She is also being followed by [facility] cardiology and possibly pursuing intracardiac echo for further evaluation of PFO.  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  She is currently pursuing long-term disability and request further assistance.  Continues to work with SLP but notes continued improvement.  Reports compliance on aspirin and atorvastatin without associated side effects.  Blood pressure today 109/63.  Loop recorder has not shown atrial fibrillation thus far.  No further concerns at this time.  Update 10/30/2020 JM: [name] returns for stroke follow-up after prior visit with Dr. Pearlean Brownie 4 months ago accompanied by her husband.  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  Recommended aspirin and Brilinta for 4 weeks and aspirin alone.  LDL 23.  A1c 5.4.  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  Discharged home on 09/29/2020 with recommended outpatient PT/SLP.  Continues to work with SLP for residual Broca's aphasia which has been gradually improving.  At times can worsen with increased stress, fatigued or heightened emotions.  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  Denies new stroke/TIA  symptoms.  Completed 4 weeks of Brilinta and has remained on aspirin alone without associated side effects Compliant on atorvastatin 20 mg daily without associated side effects Blood pressure today 112/73 Loop recorder has not shown atrial fibrillation thus far  Both husband and patient are understandably frustrated about continued strokes with unknown cause and lack of further treatment or prevention options.  Discussed Remi Haggard trial but she declines interest and questions second opinion from a different neurologist.  Family/friends recommended Wynona Canes, MD who is a vascular neurologist at [facility].   No further concerns at this time  Update 07/01/2020 Dr. Leonie Man: Patient returns for follow-up after last visit with [name] 6 weeks ago.  She was readmitted to [facility] on 06/03/2020 with sudden onset of left arm weakness and difficulty lifting and heaviness.  This happened a few days after she had switched from dual antiplatelet therapy to aspirin only.  MRI scan showed a right frontal precentral gyrus embolic infarct of cryptogenic etiology.  MRA of the brain and neck showed no large vessel stenosis or occlusion.  She had previously had a cardiac CT in 05/23/2020 which had shown no PFO or shunt and 2D echo and TEE in September which were unremarkable.  Loop interrogation this admission showed no paroxysmal A. fib.  LP was offered but she declined.  Cerebral angiogram was obtained to look for cerebral vasculitis but was negative.  LDL cholesterol was 64 mg percent and hemoglobin A1c was 5.3.  She was restarted on dual antiplatelet therapy aspirin 81 mg and Plavix 75 mg daily and continued deferred to come back to see me today.  She states she is done well.  Her left arm weakness has resolved completely.  She has slightly diminished fine motor skills in the left hand but no real weakness.  She is tolerating Plavix well without bruising or bleeding as well as Lipitor without muscle aches and  pains.  Her blood pressures well controlled today it is 118/69.  She has no new complaints.  She is eating healthy and losing weight  Hospital follow-up 05/20/2020 JM: Ms. Gurman is being seen for hospital follow-up accompanied by her husband.  She reports initially doing well at discharge but over the past month (approx 1 month post discharge), she has been experiencing word finding difficulties as well as frequent bilateral temporal and occipital headaches.  She does have history of migraines but has not experienced a migraine "in many years".  Headache consistent with throbbing sensation but denies photophobia, phonophobia or nausea/vomiting.  Can occur up to 2-3 times monthly and may last for 3 to 4 days.  She will take Tylenol with some benefit.  She has returned back to work at [employer] dermatology without great difficulty but reports even her colleagues have noticed occasional speech difficulty.  She has remained on both aspirin and Plavix without bleeding or bruising as well as atorvastatin 10 mg daily without myalgias.  Further review of hypercoagulable and autoimmune labs pending at discharge unremarkable.  She was evaluated by Dr. Burt Knack on 05/12/2020 and personally reviewed the office note. Per Dr. Burt Knack, he reviewed TEE and I did not appreciate any clear evidence of PFO and TCD negative therefore recommended pursuing cardiac CTA to further assess for cardiac anomalies which is scheduled for  this Friday.  He believes increased risk of atrial fibrillation with history of prior heart surgery and continued presence of right atrial dilation and tricuspid regurgitation and referred to EP for further consideration of loop recorder.  She has appointment scheduled today with Dr. Lalla BrothersLambert for further discussion of loop recorder.  No further concerns at this time.  Stroke admission 03/11/2020 Ms. Miracle Nelda MarseilleL Gander is a 50 y.o. female with history of hashimoto thyroiditis and ASD closure 30 years ago who  present[facility] ED on 03/11/2020 for right arm and leg weakness.  Personally reviewed hospitalization pertinent progress notes, lab work and imaging with summary provided.  Evaluated by Dr. Roda ShuttersXu with stroke work-up revealing right parietal occipital region and left frontal lobe small infarcts s/p tPA, infarcts embolic secondary to unknown source concerning for ASD/PFO related as TEE positive for PFO.  Refer to Dr. Excell Seltzerooper for outpatient evaluation of PFO.  Hypercoagulable and autoimmune labs pending at discharge.  Recommended DAPT until evaluation with Dr. Excell Seltzerooper outpatient.  LDL 94 and initiate atorvastatin 20 mg daily. No hx of HTN or DM.  History of Hashimoto thyroiditis not on Synthroid PTA per husband with TSH 6.13 (but FT4 normal), thyroid peroxidase Ab 55 and thyroglobulin Ab 1.5 and advised follow-up with PCP outpatient for further management.  Her stroke risk factors include hx of ASD closure at age 50 and obesity.  No prior stroke history.  Evaluated by therapies and discharged home in stable condition without therapy needs on 03/14/2020.  Stroke: right parieto-occipital region and left frontal lobe small infarcts, embolic pattern, source uncertain, concerning for ASD/PFO related CT head no acute finding CTA head and neck neg MRI  Brain right parieto-occipital region and left frontal lobe small infartcts MRI C-spine normal cervical spinal cord 2D Echo EF 50-55% TEE unremarkable but positive for PFO TCD bubble study - negative LE venous doppler - negative LDL 94 HgbA1c 5.3 UDS neg hypercoag and autoimmune labs - so far neg, rest pending No indication for LP or heart monitoring at this time - will refer to Dr. Pearlean BrownieSethi for second opinion SCDs for VTE prophylaxis No antithrombotic prior to admission, now on ASA 81 and plavix 75 DAPT for stroke prevention. Continue DAPT until sees Dr. Excell Seltzerooper as outpt. Patient counseled to be compliant with her antithrombotic medications Ongoing aggressive stroke  risk factor management Therapy recommendations:  None Disposition:  Discharge to home      ROS:   14 system review of systems performed and negative with exception of those listed in HPI  PMH:  Past Medical History:  Diagnosis Date   Hx of migraines    Stroke (HCC)    06/03/20    PSH:  Past Surgical History:  Procedure Laterality Date   BUBBLE STUDY  03/13/2020   Procedure: BUBBLE STUDY;  Surgeon: Quintella Reicherturner, Traci R, MD;  Location: [facility] ENDOSCOPY;  Service: Cardiovascular;;   ENDOMETRIAL ABLATION     Uterine/heavy menses   IR ANGIO INTRA EXTRACRAN SEL COM CAROTID INNOMINATE BILAT MOD SED  06/06/2020   IR ANGIO VERTEBRAL SEL VERTEBRAL BILAT MOD SED  06/06/2020   IR CT HEAD LTD  09/25/2020   IR PERCUTANEOUS ART THROMBECTOMY/INFUSION INTRACRANIAL INC DIAG ANGIO  09/25/2020   IR US GUIDE VASC ACCESS RIGHT  06/06/2020   RADIOLOGY WITH ANESTHESIA N/A 09/25/2020   Procedure: RADIOLOGY WITH ANESTHESIA;  Surgeon: Radiologist, Medication, MD;  Location: [facility] OR;  Service: Radiology;  Laterality: N/A;   SHOULDER SURGERY     Left,/bone spurs  TEE WITHOUT CARDIOVERSION N/A 03/13/2020   Procedure: TRANSESOPHAGEAL ECHOCARDIOGRAM (TEE);  Surgeon: Sueanne Margarita, MD;  Location: [facility] ENDOSCOPY;  Service: Cardiovascular;  Laterality: N/A;    Social History:  Social History   Socioeconomic History   Marital status: Married    Spouse name: Not on file   Number of children: Not on file   Years of education: Not on file   Highest education level: Not on file  Occupational History   Occupation: full time  Tobacco Use   Smoking status: Former   Smokeless tobacco: Never  Substance and Sexual Activity   Alcohol use: Yes    Alcohol/week: 1.0 standard drink    Types: 1 Cans of beer per week    Comment: states 1 beer every other day or so    Drug use: Never   Sexual activity: Not on file  Other Topics Concern   Not on file  Social History Narrative   Last Mamm: 04.07.10 @ [facility] BI-RADS Category 1:  Negative^MM DIGITAL DG BILATERAL   Lives with husband and 2 daughters   Left Handed   Drinks no caffeine   Social Determinants of Health   Financial Resource Strain: Not on file  Food Insecurity: Not on file  Transportation Needs: Not on file  Physical Activity: Not on file  Stress: Not on file  Social Connections: Not on file  Intimate Partner Violence: Not on file    Family History:  Family History  Problem Relation Age of Onset   Hypertension Mother    Hypothyroidism Mother    High Cholesterol Mother    Atrial fibrillation Mother    COPD Father    Emphysema Father    Hypothyroidism Sister     Medications:   Current Outpatient Medications on File Prior to Visit  Medication Sig Dispense Refill   atorvastatin (LIPITOR) 20 MG tablet Take 1 tablet (20 mg total) by mouth daily. 90 tablet 3   clopidogrel (PLAVIX) 75 MG tablet Take 1 tablet (75 mg total) by mouth daily. 90 tablet 3   Rimegepant Sulfate 75 MG TBDP Take one tablet daily for onset migraine. (max 1 tab / 24 hours) 8 tablet 5   No current facility-administered medications on file prior to visit.    Allergies:   Allergies  Allergen Reactions   Bee Venom Swelling      OBJECTIVE:  Physical Exam  Vitals:   06/15/21 1249  BP: 114/67  Pulse: 76  Weight: 190 lb (86.2 kg)  Height: 5\' 4"  (1.626 m)    Body mass index is 32.61 kg/m. No results found.   General: well developed, well nourished, pleasant middle-age Caucasian female, seated, in no evident distress Head: head normocephalic and atraumatic.   Neck: supple with no carotid or supraclavicular bruits Cardiovascular: regular rate and rhythm, no murmurs Musculoskeletal: no deformity Skin:  no rash/petichiae Vascular:  Normal pulses all extremities   Neurologic Exam Mental Status: Awake and fully alert.  Moderate expressive aphasia.  No evidence of dysarthria or receptive aphasia.  Able to speak in short sentences.  Able to follow simple commands  without difficulty.  Oriented to place and time. Recent and remote memory intact. Attention span, concentration and fund of knowledge appropriate. Mood and affect appropriate.  Cranial Nerves: Pupils equal, briskly reactive to light. Extraocular movements full without nystagmus. Visual fields full to confrontation. Hearing intact. Facial sensation intact.  Face, tongue and palate moves normally and symmetrically.  Motor: Normal bulk and tone. Normal strength in  all tested extremity muscles. Sensory.: intact to touch , pinprick , position and vibratory sensation.  Coordination: Rapid alternating movements normal in all extremities. Finger-to-nose and heel-to-shin performed accurately bilaterally. Gait and Station: Arises from chair without difficulty. Stance is normal. Gait demonstrates normal stride length and balance without use of assistive device.   Reflexes: 1+ and symmetric. Toes downgoing.         ASSESSMENT: [name] is a 50 y.o. 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  Vascular risk factors include history of ASD and s/p closure 30 years ago, HLD,  thyroiditis, former tobacco use and history of menstrual migraines    PLAN:  Recurrent ESUS: Residual deficits: Moderate expressive aphasia, and reading/writing difficulty. Continue working with SLP. Will continue to assist with long-term disability as she will likely have great difficulty returning to work place due to residual deficits Prior c/o transient worsening aphasia - repeat MRI negative for new/acute concerns Continue to follow with [facility] neurology  Continue Plavix and atorvastatin for secondary stroke prevention.   Loop recorder has not shown atrial fibrillation thus far Prior work-up: TCD negative.  TEE no significant IA shunt. Cardiac cath unremarkable.  Cardiac CT no CAD, PFO or IA shunt.  Cerebral angiogram negative for vasculitis.  Hypercoagulable and autoimmune labs unremarkable Discussed  secondary stroke prevention measures and importance of close PCP follow up for aggressive stroke risk factor management including HLD with LDL goal<70. Repeat cholesterol levels today     Follow up in 6 months or call earlier if needed   CC:  Tower, Wynelle Fanny, MD    I spent 32 minutes of face-to-face and non-face-to-face time with patient.  This included previsit chart review, lab review, study review, electronic health record documentation, patient education and discussion regarding history of prior recurrent strokes and residual deficits, evaluation by [facility] neurology and cardiology and review of OV notes, secondary stroke prevention measures and aggressive stroke risk factor management and answered all other questions to patients satisfaction  Frann Rider, [facility]  [facility] [address] [place], [place] [address]  Phone [phone] Fax [phone] Note: This document was prepared with digital dictation and possible smart phrase technology. Any transcriptional errors that result from this process are unintentional.

## 2021-06-16 ENCOUNTER — Other Ambulatory Visit: Payer: Self-pay | Admitting: Adult Health

## 2021-06-16 ENCOUNTER — Encounter: Payer: Self-pay | Admitting: Adult Health

## 2021-06-16 LAB — LIPID PANEL
Chol/HDL Ratio: 2.8 ratio (ref 0.0–4.4)
Cholesterol, Total: 157 mg/dL (ref 100–199)
HDL: 56 mg/dL (ref >39)
LDL Chol Calc (NIH): 82 mg/dL (ref 0–99)
Triglycerides: 105 mg/dL (ref 0–149)
VLDL Cholesterol Cal: 19 mg/dL (ref 5–40)

## 2021-06-16 MED ORDER — ATORVASTATIN CALCIUM 40 MG PO TABS: 40.0000 mg | ORAL_TABLET | Freq: Every day | ORAL | 5 refills | Status: DC

## 2021-06-23 LAB — CUP PACEART REMOTE DEVICE CHECK
Date Time Interrogation Session: 20221218084525
Implantable Pulse Generator Implant Date: 20211116

## 2021-06-30 ENCOUNTER — Ambulatory Visit: Payer: BC Managed Care – PPO | Admitting: Speech Pathology

## 2021-06-30 ENCOUNTER — Ambulatory Visit (INDEPENDENT_AMBULATORY_CARE_PROVIDER_SITE_OTHER): Payer: BC Managed Care – PPO

## 2021-06-30 DIAGNOSIS — I639 Cerebral infarction, unspecified: Secondary | ICD-10-CM

## 2021-07-02 ENCOUNTER — Ambulatory Visit: Payer: BC Managed Care – PPO | Admitting: Speech Pathology

## 2021-07-03 ENCOUNTER — Ambulatory Visit: Payer: BC Managed Care – PPO | Admitting: Speech Pathology

## 2021-07-09 ENCOUNTER — Ambulatory Visit: Payer: BC Managed Care – PPO | Attending: Neurology | Admitting: Speech Pathology

## 2021-07-09 ENCOUNTER — Telehealth: Payer: Self-pay

## 2021-07-09 ENCOUNTER — Encounter: Payer: Self-pay | Admitting: Speech Pathology

## 2021-07-09 ENCOUNTER — Other Ambulatory Visit: Payer: Self-pay

## 2021-07-09 DIAGNOSIS — R4701 Aphasia: Secondary | ICD-10-CM | POA: Insufficient documentation

## 2021-07-09 NOTE — Therapy (Signed)
[place]. [place], [place], [address] Phone: [phone]   Fax:  [phone]  Speech Language Pathology Treatment & Recertification  Patient Details  Name: [name] MRN: [id number] Date of Birth: 11/13/1970 Referring Provider (SLP): Phillips Odor MD   Encounter Date: 07/09/2021   End of Session - 07/09/21 1635     Visit Number 40    Date for SLP Re-Evaluation 08/09/21    Authorization - Visit Number 1    Authorization - Number of Visits 4    SLP Start Time 0850    SLP Stop Time  0930    SLP Time Calculation (min) 40 min    Activity Tolerance Patient tolerated treatment well             Past Medical History:  Diagnosis Date   Hx of migraines    Stroke ([place])    06/03/20    Past Surgical History:  Procedure Laterality Date   BUBBLE STUDY  03/13/2020   Procedure: BUBBLE STUDY;  Surgeon: Sueanne Margarita, MD;  Location: [place];  Service: Cardiovascular;;   ENDOMETRIAL ABLATION     Uterine/heavy menses   IR ANGIO INTRA EXTRACRAN SEL COM CAROTID INNOMINATE BILAT MOD SED  06/06/2020   IR ANGIO VERTEBRAL SEL VERTEBRAL BILAT MOD SED  06/06/2020   IR CT HEAD LTD  09/25/2020   IR PERCUTANEOUS ART THROMBECTOMY/INFUSION INTRACRANIAL INC DIAG ANGIO  09/25/2020   IR US GUIDE VASC ACCESS RIGHT  06/06/2020   RADIOLOGY WITH ANESTHESIA N/A 09/25/2020   Procedure: RADIOLOGY WITH ANESTHESIA;  Surgeon: Radiologist, Medication, MD;  Location: [place];  Service: Radiology;  Laterality: N/A;   SHOULDER SURGERY     Left,/bone spurs   TEE WITHOUT CARDIOVERSION N/A 03/13/2020   Procedure: TRANSESOPHAGEAL ECHOCARDIOGRAM (TEE);  Surgeon: Sueanne Margarita, MD;  Location: [facility] ENDOSCOPY;  Service: Cardiovascular;  Laterality: N/A;    There were no vitals filed for this visit.   Subjective Assessment - 07/09/21 1635     Subjective "Going good."    Currently in Pain? No/denies                       SLP Short  Term Goals - 05/25/21 1126       SLP SHORT TERM GOAL #1   Title Pt will increase amount of content words per sentence by (2+) using Response Elaboration Training (RET) to expand verbal utterances in conversation given prompting across 3 sessions.    Time 2    Period Weeks    Status Achieved   Not met on 9/21 to cont   Target Date 04/15/21      SLP SHORT TERM GOAL #2   Title Pt/husband will report use of 1+ word finding strategy to decrease instances of anomia and conversational breakdowns.    Time 2    Period Weeks    Status Achieved    Target Date 06/01/21              SLP Long Term Goals - 07/09/21 1641       SLP LONG TERM GOAL #1   Title Pt will demonstrate understanding of other therapy treatments she can complete at home independently.    Time 1    Period Months    Status New    Target Date 08/09/21              Plan - 07/09/21 1636     Clinical  Impression Statement See tx note. Discussed with pt regarding what pt perceives as "a plateau". SLP reedu on spontaneous recovery and trajectory of progress slowing down after 6 months. SLP stressed that making progress is still possible after 6 months, but based on research, this will come in smaller increments. Pt and SLP discussed continuing speech therapy services. SLP suggested completing therapy for additional couple sessions to to provide with continued therapy (HEP) she can work on at home.  Pt reported she is not bothered by her language impairment and has accepted it. 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 Pt reported she has been told that she will not be returning to work, but will continue to do crafts. Pt and SLP in agreement.    Speech Therapy Frequency Biweekly    Duration 4 weeks    Treatment/Interventions Cognitive reorganization;Multimodal communcation approach;Language facilitation;Compensatory techniques;Internal/external aids;SLP  instruction and feedback;Environmental controls;Patient/family education;Functional tasks;Cueing hierarchy    Potential to Achieve Goals Good    Potential Considerations Ability to learn/carryover information    Consulted and Agree with Plan of Care Patient;Family member/caregiver    Family Member Consulted Husband             Patient will benefit from skilled therapeutic intervention in order to improve the following deficits and impairments:   Aphasia    Problem List Patient Active Problem List   Diagnosis Date Noted   Colon cancer screening 12/17/2020   Cancer screening 12/17/2020   Encounter for screening mammogram for breast cancer 12/09/2020   Acute ischemic stroke ([place]) 09/25/2020   History of ischemic left MCA stroke s/p tPA 06/06/2020   History of repair of congenital atrial septal defect (ASD) 06/06/2020   Hyperlipidemia LDL goal <70 06/06/2020   Received intravenous tissue plasminogen activator (tPA) in emergency department    Hypothyroid 12/22/2011   Stress incontinence 12/22/2011   Family history of kidney disease 12/22/2011   Encounter for routine gynecological examination 12/22/2011   Routine general medical examination at a health care facility 12/19/2011   Speech Therapy Progress Note  Dates of Reporting Period: 02/20/21 to present  Subjective Statement: "I am not bothered by my speech. I just laugh and move on."  Objective: See previous tx notes.  Goal Update: Met and adjusted  Plan: Continue to see pt for additional session(s) to ensure pt feels comfortable with discharge.  Reason Skilled Services are Required: SLP rec skilld ST services to maximize functional communication.   Verdene Lennert, [place] 07/09/2021, 4:43 PM  [place]. [place], [place], [address] Phone: [phone]   Fax:  [phone]   Name: [name] MRN: [id number] Date of Birth: 31-Dec-1970

## 2021-07-09 NOTE — Progress Notes (Signed)
Carelink Summary Report / Loop Recorder 

## 2021-07-09 NOTE — Telephone Encounter (Signed)
Received disability paperwork. Forms completed and placed in NP's office for review/signature.

## 2021-07-10 DIAGNOSIS — I6930 Unspecified sequelae of cerebral infarction: Secondary | ICD-10-CM | POA: Diagnosis not present

## 2021-07-13 ENCOUNTER — Telehealth: Payer: Self-pay | Admitting: *Deleted

## 2021-07-13 ENCOUNTER — Encounter: Payer: Self-pay | Admitting: Adult Health

## 2021-07-13 NOTE — Telephone Encounter (Signed)
Sent a my chart message to the patient on this.

## 2021-07-13 NOTE — Telephone Encounter (Signed)
Pt Standard form faxed to [phone]

## 2021-07-13 NOTE — Telephone Encounter (Signed)
Signed and placed in outbox.  Thank you. ?

## 2021-08-03 ENCOUNTER — Ambulatory Visit (INDEPENDENT_AMBULATORY_CARE_PROVIDER_SITE_OTHER): Payer: BC Managed Care – PPO

## 2021-08-03 DIAGNOSIS — I639 Cerebral infarction, unspecified: Secondary | ICD-10-CM | POA: Diagnosis not present

## 2021-08-03 LAB — CUP PACEART REMOTE DEVICE CHECK
Date Time Interrogation Session: 20230129230648
Implantable Pulse Generator Implant Date: 20211116

## 2021-08-04 ENCOUNTER — Encounter: Payer: Self-pay | Admitting: Speech Pathology

## 2021-08-04 ENCOUNTER — Ambulatory Visit: Payer: BC Managed Care – PPO | Admitting: Speech Pathology

## 2021-08-04 ENCOUNTER — Other Ambulatory Visit: Payer: Self-pay

## 2021-08-04 DIAGNOSIS — R4701 Aphasia: Secondary | ICD-10-CM

## 2021-08-04 NOTE — Therapy (Signed)
[place]. [place], [place], [address] Phone: [phone]   Fax:  [phone]  Speech Language Pathology Treatment and Discharge Summary  Patient Details  Name: [name] MRN: [id number] Date of Birth: October 23, 1970 Referring Provider (SLP): Phillips Odor MD   Encounter Date: 08/04/2021   End of Session - 08/04/21 0851     Visit Number 41    Date for SLP Re-Evaluation 08/09/21    Authorization - Visit Number 2    Authorization - Number of Visits 4    SLP Start Time 0846    SLP Stop Time  0925    SLP Time Calculation (min) 39 min    Activity Tolerance Patient tolerated treatment well             Past Medical History:  Diagnosis Date   Hx of migraines    Stroke ([place])    06/03/20    Past Surgical History:  Procedure Laterality Date   BUBBLE STUDY  03/13/2020   Procedure: BUBBLE STUDY;  Surgeon: Sueanne Margarita, MD;  Location: [facility];  Service: Cardiovascular;;   ENDOMETRIAL ABLATION     Uterine/heavy menses   IR ANGIO INTRA EXTRACRAN SEL COM CAROTID INNOMINATE BILAT MOD SED  06/06/2020   IR ANGIO VERTEBRAL SEL VERTEBRAL BILAT MOD SED  06/06/2020   IR CT HEAD LTD  09/25/2020   IR PERCUTANEOUS ART THROMBECTOMY/INFUSION INTRACRANIAL INC DIAG ANGIO  09/25/2020   IR US GUIDE VASC ACCESS RIGHT  06/06/2020   RADIOLOGY WITH ANESTHESIA N/A 09/25/2020   Procedure: RADIOLOGY WITH ANESTHESIA;  Surgeon: Radiologist, Medication, MD;  Location: [facility];  Service: Radiology;  Laterality: N/A;   SHOULDER SURGERY     Left,/bone spurs   TEE WITHOUT CARDIOVERSION N/A 03/13/2020   Procedure: TRANSESOPHAGEAL ECHOCARDIOGRAM (TEE);  Surgeon: Sueanne Margarita, MD;  Location: [facility];  Service: Cardiovascular;  Laterality: N/A;    There were no vitals filed for this visit.   Subjective Assessment - 08/04/21 0850     Subjective "i'm feeling good."    Patient is accompained by: Family member    Currently in Pain?  No/denies                   ADULT SLP TREATMENT - 08/04/21 0925       General Information   Behavior/Cognition Alert;Cooperative;Pleasant mood      Treatment Provided   Treatment provided Cognitive-Linquistic      Cognitive-Linquistic Treatment   Treatment focused on Aphasia    Skilled Treatment Edu on Talkpath therapy. Created login for patient to continue therapy at home. Taught pt Anagram and Copy Recall to continue to work on writing. Assisted patient on setting up text to speech to assist with reading comprehension. Pt in agreement with discharge.      Assessment / Recommendations / Plan   Plan Continue with current plan of care      Progression Toward Goals   Progression toward goals Progressing toward goals                SLP Short Term Goals - 05/25/21 1126       SLP SHORT TERM GOAL #1   Title Pt will increase amount of content words per sentence by (2+) using Response Elaboration Training (RET) to expand verbal utterances in conversation given prompting across 3 sessions.    Time 2    Period Weeks    Status Achieved   Not met on 9/21  to cont   Target Date 04/15/21      SLP SHORT TERM GOAL #2   Title Pt/husband will report use of 1+ word finding strategy to decrease instances of anomia and conversational breakdowns.    Time 2    Period Weeks    Status Achieved    Target Date 06/01/21              SLP Long Term Goals - 08/04/21 0917       SLP LONG TERM GOAL #1   Title Pt will demonstrate understanding of other therapy treatments she can complete at home independently.    Time 1    Period Months    Status Achieved              Plan - 08/04/21 7829     Clinical Impression Statement See tx note. Pt reports she feels things are going well in terms of communication.Pt reports she gets stuck x1-2/day where she absolutely cannot find the word. If she gets stuck, she looks around for the object or attempts to describe it. Reading  continues to be difficult, but it with exta time and repetition she is able to comprehend meaning of small paragraphs. Pt reports she is using speech to text to aid written communication.  SLP to d/c and pt in agreement.    Speech Therapy Frequency Biweekly    Duration 4 weeks    Treatment/Interventions Cognitive reorganization;Multimodal communcation approach;Language facilitation;Compensatory techniques;Internal/external aids;SLP instruction and feedback;Environmental controls;Patient/family education;Functional tasks;Cueing hierarchy    Potential to Achieve Goals Good    Potential Considerations Ability to learn/carryover information    Consulted and Agree with Plan of Care Patient;Family member/caregiver    Family Member Consulted Husband             Patient will benefit from skilled therapeutic intervention in order to improve the following deficits and impairments:   Aphasia    Problem List Patient Active Problem List   Diagnosis Date Noted   Colon cancer screening 12/17/2020   Cancer screening 12/17/2020   Encounter for screening mammogram for breast cancer 12/09/2020   Acute ischemic stroke ([place]) 09/25/2020   History of ischemic left MCA stroke s/p tPA 06/06/2020   History of repair of congenital atrial septal defect (ASD) 06/06/2020   Hyperlipidemia LDL goal <70 06/06/2020   Received intravenous tissue plasminogen activator (tPA) in emergency department    Hypothyroid 12/22/2011   Stress incontinence 12/22/2011   Family history of kidney disease 12/22/2011   Encounter for routine gynecological examination 12/22/2011   Routine general medical examination at a health care facility 12/19/2011   SPEECH THERAPY DISCHARGE SUMMARY  Visits from Start of Care: 41  Current functional level related to goals / functional outcomes: Has made gains towards all goals.   Remaining deficits: Continues to exhibit language deficits associated with aphasia. Continues to exhibit  word  finding deficits, writing and reading impairment.; however, pt is able to use strategies independently to compensate for deficits.   Education / Equipment: SLP provided HEP for patient to continue working on language skills at home. Pt is in agreement with discharge.    Patient agrees to discharge. Patient goals were met. Patient is being discharged due to being pleased with the current functional level.Verdene Lennert, [place] 08/04/2021, 9:27 AM  [place]. [place], [place], [address] Phone: [phone]   Fax:  [phone]   Name: Yulieth L  Ucci MRN: [id number] Date of Birth: May 23, 1971

## 2021-08-11 ENCOUNTER — Inpatient Hospital Stay: Admission: RE | Admit: 2021-08-11 | Payer: BC Managed Care – PPO | Source: Ambulatory Visit

## 2021-08-11 NOTE — Progress Notes (Signed)
Carelink Summary Report / Loop Recorder 

## 2021-08-12 ENCOUNTER — Ambulatory Visit (INDEPENDENT_AMBULATORY_CARE_PROVIDER_SITE_OTHER)
Admission: RE | Admit: 2021-08-12 | Discharge: 2021-08-12 | Disposition: A | Discharge status: Home / Self Care | Payer: BC Managed Care – PPO | Source: Ambulatory Visit | Attending: Pulmonary Disease | Admitting: Pulmonary Disease

## 2021-08-12 ENCOUNTER — Other Ambulatory Visit: Payer: Self-pay

## 2021-08-12 DIAGNOSIS — R911 Solitary pulmonary nodule: Secondary | ICD-10-CM | POA: Diagnosis not present

## 2021-08-12 IMAGING — CT CT CHEST W/O CM
2 of 4 series · 15 of 36 positions shown, 18 images · non-contrast
Comparison: Chest CT dated [DATE].

CLINICAL DATA: Follow-up pulmonary nodule.



[Series 2: thorax · axial · 0.69mm/px · z∈[-337,-83]mm · 12 of 151 slices shown, 15 images]
[im 12/151  mediastinal]
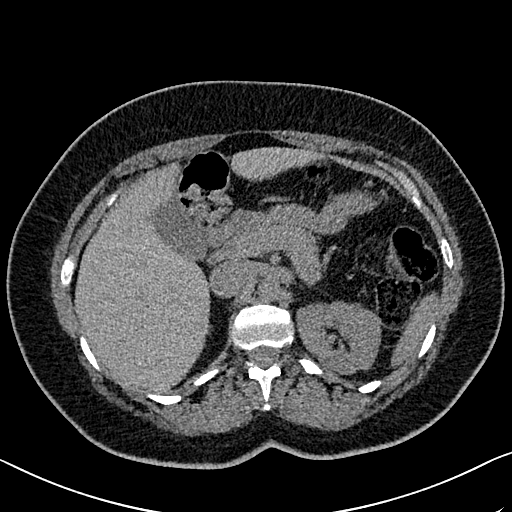
[im 12/151  lung]
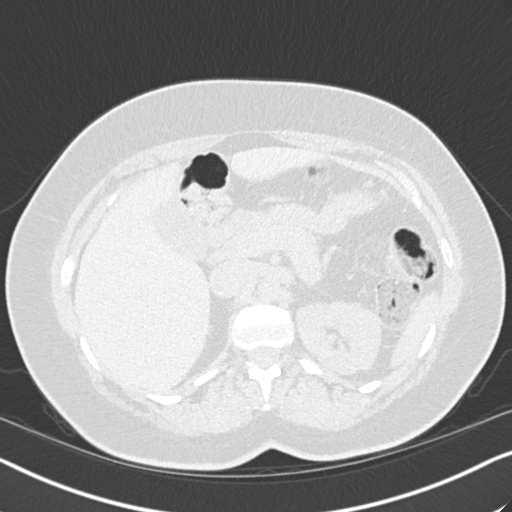
[im 24/151  lung]
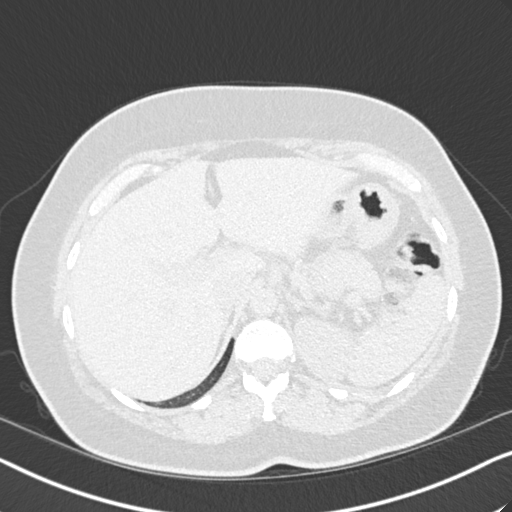
[im 35/151  lung]
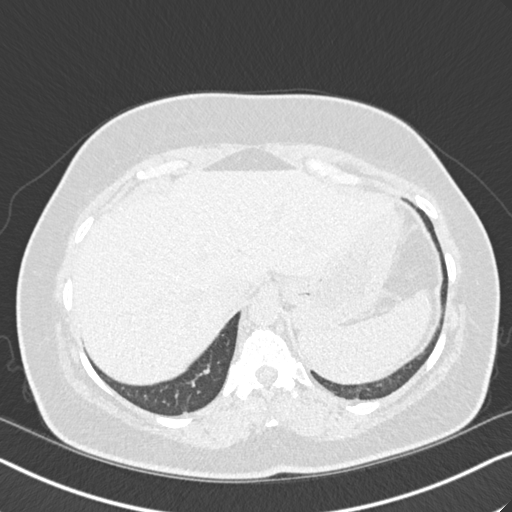
[im 47/151  lung]
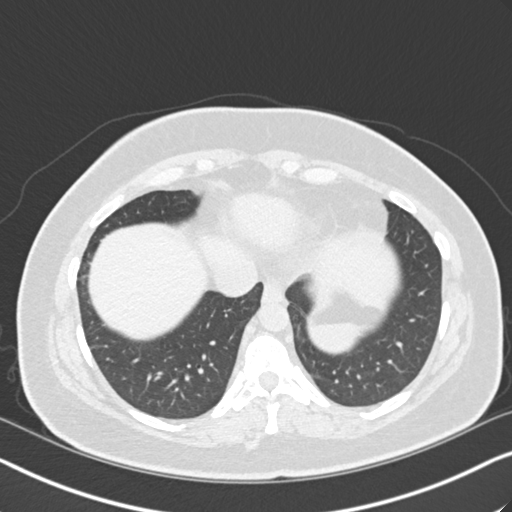
[im 58/151  mediastinal]
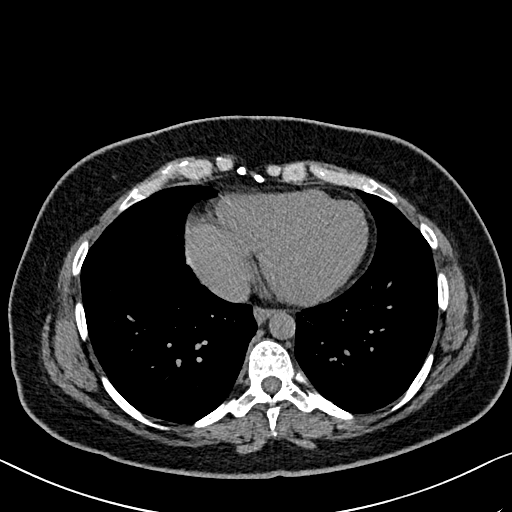
[im 58/151  lung]
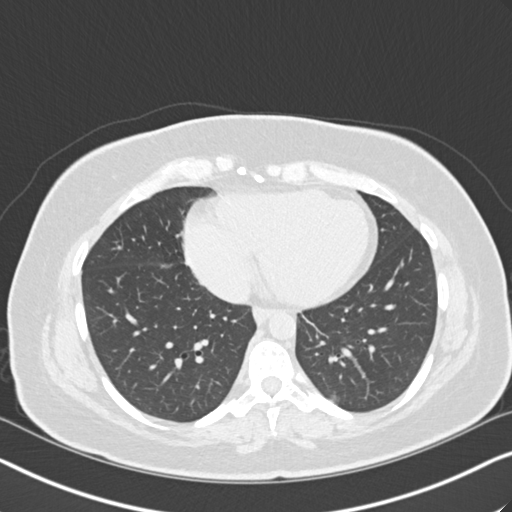
[im 70/151  lung]
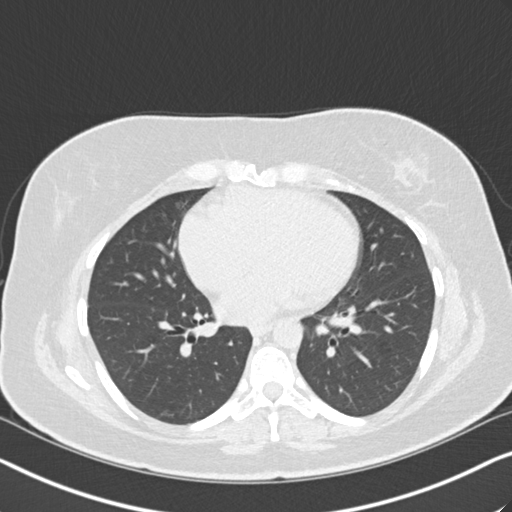
[im 81/151  lung]
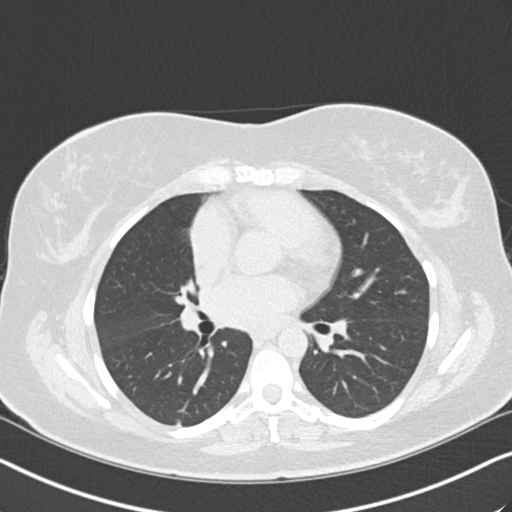
[im 93/151  lung]
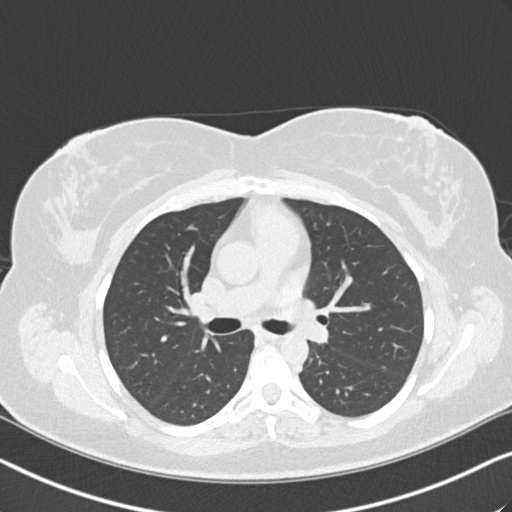
[im 104/151  mediastinal]
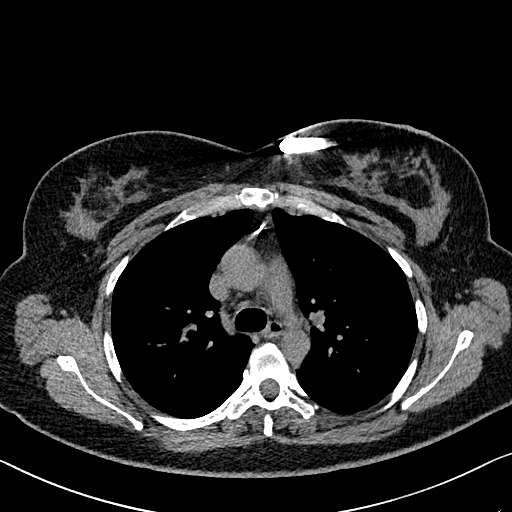
[im 104/151  lung]
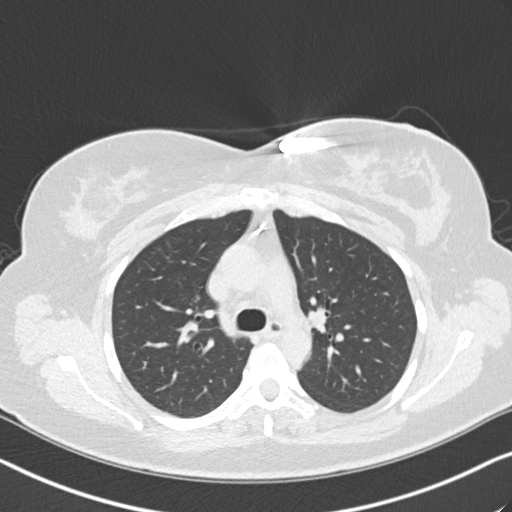
[im 116/151  lung]
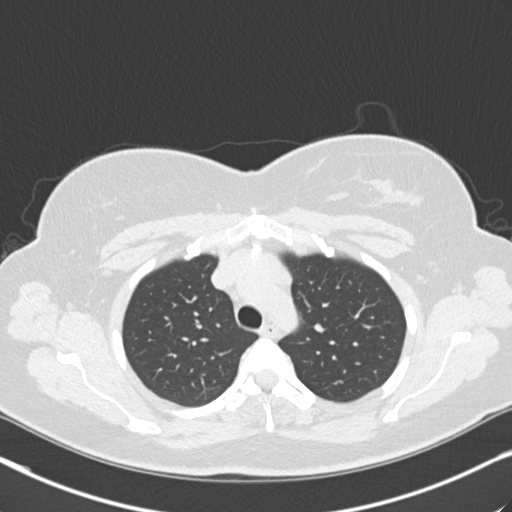
[im 127/151  lung]
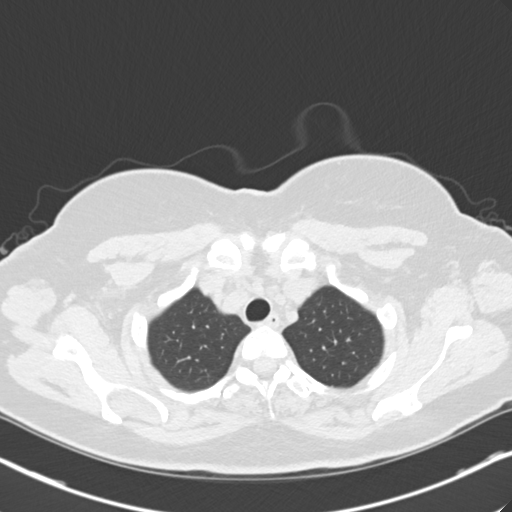
[im 139/151  lung]
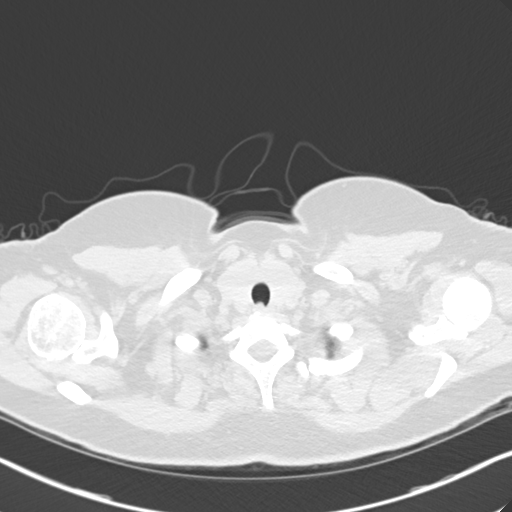

[Series 5: coronal · coronal · 0.59mm/px · 3 of 118 slices shown]
[im 24/118  lung]
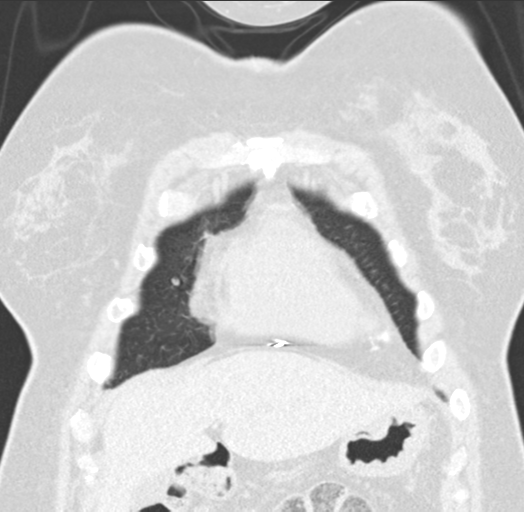
[im 47/118  lung]
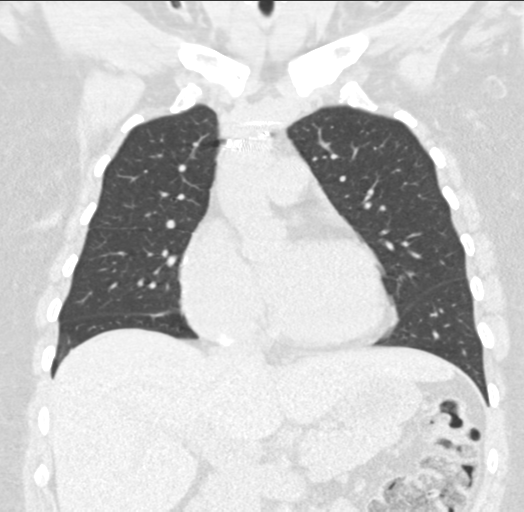
[im 71/118  lung]
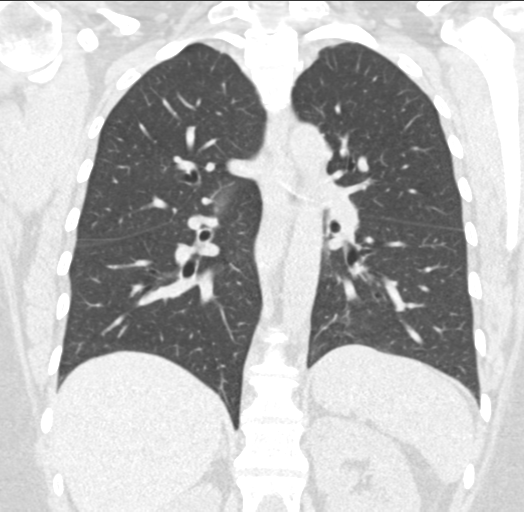

[15 of 36 positions shown; findings below may reference images not displayed]

FINDINGS: Evaluation of this exam is limited in the absence of intravenous
contrast.

Cardiovascular: Borderline cardiomegaly. No pericardial effusion.
Stable high attenuating density anterior to the heart with
associated streak artifact as well as calcific focus adjacent to the
cardiac apex. The thoracic aorta and central pulmonary arteries are
grossly unremarkable on this noncontrast CT.

Mediastinum/Nodes: No hilar or mediastinal adenopathy. The esophagus
is grossly unremarkable. No mediastinal fluid collection.

Lungs/Pleura: Similar appearance of a 4 mm right middle lobe nodule
(68/3). No focal consolidation, pleural effusion, or pneumothorax.
The central airways are patent.

Upper Abdomen: No acute abnormality.

Musculoskeletal: Prior sternotomy. No acute osseous pathology.
Cardiac monitor device in the anterior left chest wall.
IMPRESSION: 1. No acute intrathoracic pathology.
2. Similar appearance of a 4 mm right middle lobe nodule.

## 2021-08-14 NOTE — Progress Notes (Signed)
Please let patient know I reviewed her CT imaging.  Similar 4 mm right middle lobe lung nodule stable.  Thanks,  BLI  Josephine Igo, DO [facility] 08/14/2021 4:17 PM

## 2021-08-18 ENCOUNTER — Telehealth: Payer: Self-pay | Admitting: Pulmonary Disease

## 2021-08-18 DIAGNOSIS — Z0289 Encounter for other administrative examinations: Secondary | ICD-10-CM

## 2021-08-19 NOTE — Telephone Encounter (Signed)
Called and LVM in regards to her CT results. Will try again later.

## 2021-08-20 NOTE — Telephone Encounter (Signed)
Patient is returning phone call. Patient phone number is [phone].

## 2021-08-21 NOTE — Telephone Encounter (Signed)
Called and spoke with patient. She verbalized understanding of her CT results.   Nothing further needed at time of call.

## 2021-09-07 ENCOUNTER — Ambulatory Visit (INDEPENDENT_AMBULATORY_CARE_PROVIDER_SITE_OTHER): Payer: BC Managed Care – PPO

## 2021-09-07 DIAGNOSIS — I639 Cerebral infarction, unspecified: Secondary | ICD-10-CM | POA: Diagnosis not present

## 2021-09-07 LAB — CUP PACEART REMOTE DEVICE CHECK
Date Time Interrogation Session: 20230303230829
Implantable Pulse Generator Implant Date: 20211116

## 2021-09-17 NOTE — Progress Notes (Signed)
Carelink Summary Report / Loop Recorder 

## 2021-10-12 ENCOUNTER — Ambulatory Visit (INDEPENDENT_AMBULATORY_CARE_PROVIDER_SITE_OTHER): Payer: BC Managed Care – PPO

## 2021-10-12 DIAGNOSIS — I639 Cerebral infarction, unspecified: Secondary | ICD-10-CM | POA: Diagnosis not present

## 2021-10-13 LAB — CUP PACEART REMOTE DEVICE CHECK
Date Time Interrogation Session: 20230405230728
Implantable Pulse Generator Implant Date: 20211116

## 2021-10-27 NOTE — Progress Notes (Signed)
Carelink Summary Report / Loop Recorder 

## 2021-11-05 ENCOUNTER — Other Ambulatory Visit: Payer: Self-pay | Admitting: Adult Health

## 2021-11-16 ENCOUNTER — Ambulatory Visit (INDEPENDENT_AMBULATORY_CARE_PROVIDER_SITE_OTHER): Payer: BC Managed Care – PPO

## 2021-11-16 DIAGNOSIS — I639 Cerebral infarction, unspecified: Secondary | ICD-10-CM | POA: Diagnosis not present

## 2021-11-16 LAB — CUP PACEART REMOTE DEVICE CHECK
Date Time Interrogation Session: 20230508230738
Implantable Pulse Generator Implant Date: 20211116

## 2021-12-04 NOTE — Progress Notes (Signed)
Carelink Summary Report / Loop Recorder 

## 2021-12-15 ENCOUNTER — Ambulatory Visit: Payer: BC Managed Care – PPO | Admitting: Adult Health

## 2021-12-15 ENCOUNTER — Encounter: Payer: Self-pay | Admitting: Adult Health

## 2021-12-15 VITALS — BP 117/74 | HR 65 | Ht 64.0 in | Wt 195.0 lb

## 2021-12-15 DIAGNOSIS — I69320 Aphasia following cerebral infarction: Secondary | ICD-10-CM | POA: Diagnosis not present

## 2021-12-15 DIAGNOSIS — I639 Cerebral infarction, unspecified: Secondary | ICD-10-CM

## 2021-12-15 NOTE — Progress Notes (Signed)
[facility] [address] [place]. 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 Overall stable without new stroke/TIA symptoms.  Reports continued word finding difficulty which has been stable since prior visit, can worsen with excitement, stress or fatigue.  Continued difficulty with typing or writing a word (even simple/basic words), impaired reading comprehension, and short-term memory loss. All stable since prior visit.  Completed SLP back in January as she has plateaued.  She does continue to do exercises at home as advised by SLP.  Remains on long-term disability and continues to pursue so security disability. Maintains ADLs and majority of IADLs independently. Has been driving a little more but still primarily only drives short distance. She tries to avoid highway driving.   Compliant on plavix and atorvastatin, denies side effects Blood pressure today 117/74 Loop recorder has not shown atrial fibrillation thus far - she is interested in having ILR removed  Continues to follow with [facility] - has f/u in July  No further concerns at this time     History provided for reference purposes only Update 06/15/2021 JM: Returns for 52785-month stroke follow-up unaccompanied   Overall stable -denies new or worsening stroke/TIA symptoms Residual aphasia with gradual improvement since prior visit but reports she was told by SLP she has likely plateaued at this time.  Currently working with SLP 2x/month due to financial reasons. Also reports difficulty with reading and comprehension, difficulty  spelling correctly and difficulty with writing and typing.  Remains on long-term disability.  She is currently pursuing Social Security disability.  Able to maintain ADLs independently and able to drive short distances. Prior concerns of worsening aphasia over a 4 hr duration - repeat MR brain no acute/new abnormalities  Compliant on Plavix and atorvastatin -denies side effects Blood pressure today 114/67 Loop recorder has not shown atrial fibrillation thus far  Continues to follow with [facility] - f/u visit scheduled 1/6 Completed cardiac cath 04/06/2021 thru [facility] - per pt, unremarkable -unable to view results via epic  No further concerns at this time  Update 02/03/2021 JM: [name] is being seen for 38785-month stroke follow-up accompanied by her husband.  She reports having an episode approximately 3 weeks ago with difficulty completing sentences that lasted approximately 4 hours.  She did not seek medical attention at that time but per husband, speech has been hoarse since that time.  Denies any other associated symptoms such as confusion or cognitive changes, visual changes, weakness/numbness, balance difficulties or headache.  [facility] who [facility] order for MR brain. [facility] and possibly pursuing intracardiac echo for further evaluation of PFO.  She was previously on short-term disability but unfortunately lost her job back in June working as a Engineer, civil (consulting)nurse at [employer].  She is currently pursuing long-term disability and request further assistance.  Continues to work with SLP but notes continued improvement.  Reports compliance on aspirin and atorvastatin without associated side effects.  Blood pressure today 109/63.  Loop recorder has not shown atrial fibrillation thus far.  No further concerns at this time.  Update 10/30/2020 JM: [name] returns for stroke follow-up after prior visit with Dr. Pearlean Jamie 4 months ago  accompanied by her husband.  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  Recommended aspirin and Brilinta for 4 weeks and aspirin alone.  LDL 23.  A1c 5.4.  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  Discharged home on 09/29/2020 with recommended outpatient PT/SLP.  Continues to work with SLP for residual Broca's aphasia which has been gradually improving.  At times can worsen with increased stress, fatigued or heightened emotions.  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  Denies new stroke/TIA symptoms.  Completed 4 weeks of Brilinta and has remained on aspirin alone without associated side effects Compliant on atorvastatin 20 mg daily without associated side effects Blood pressure today 112/73 Loop recorder has not shown atrial fibrillation thus far  Both husband and patient are understandably frustrated about continued strokes with unknown cause and lack of further treatment or prevention options.  Discussed Brien Mates trial but she declines interest and questions second opinion from a different neurologist.  Family/friends recommended Jay Schlichter, MD who is a vascular neurologist at [facility].   No further concerns at this time  Update 07/01/2020 Dr. Pearlean Jamie: Patient returns for follow-up after last visit with [name] 6 weeks ago.  She was readmitted to [facility] on 06/03/2020 with sudden onset of left arm weakness and difficulty lifting and heaviness.  This  happened a few days after she had switched from dual antiplatelet therapy to aspirin only.  MRI scan showed a right frontal precentral gyrus embolic infarct of cryptogenic etiology.  MRA of the brain and neck showed no large vessel stenosis or occlusion.  She had previously had a cardiac CT in 05/23/2020 which had shown no PFO or shunt and 2D echo and TEE in September which were unremarkable.  Loop interrogation this admission showed no paroxysmal A. fib.  LP was offered but she declined.  Cerebral angiogram was obtained to look for cerebral vasculitis but was negative.  LDL cholesterol was 64 mg percent and hemoglobin A1c was 5.3.  She was restarted on dual antiplatelet therapy aspirin 81 mg and Plavix 75 mg daily and continued deferred to come back to see me today.  She states she is done well.  Her left arm weakness has resolved completely.  She has slightly diminished fine motor skills in the left hand but no real weakness.  She is tolerating Plavix well without bruising or bleeding as well as Lipitor without muscle aches and pains.  Her blood pressures well controlled today it is 118/69.  She has no new complaints.  She is eating healthy and losing weight  Hospital follow-up 05/20/2020 JM: [name] is being seen for hospital follow-up accompanied by her husband.  She reports initially doing well at discharge but over the past month (approx 1 month post discharge), she has been experiencing  word finding difficulties as well as frequent bilateral temporal and occipital headaches.  She does have history of migraines but has not experienced a migraine "in many years".  Headache consistent with throbbing sensation but denies photophobia, phonophobia or nausea/vomiting.  Can occur up to 2-3 times monthly and may last for 3 to 4 days.  She will take Tylenol with some benefit.  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  She has remained on both aspirin and Plavix without bleeding or bruising as well as atorvastatin 10 mg daily without myalgias.  Further review of hypercoagulable and autoimmune labs pending at discharge unremarkable.  She was evaluated by Dr. Excell Seltzer on 05/12/2020 and personally reviewed the office note. Per Dr. Excell Seltzer, he reviewed TEE and I did not appreciate any clear evidence of PFO and TCD negative therefore recommended pursuing cardiac CTA to further assess for cardiac anomalies which is scheduled for this Friday.  He believes increased risk of atrial fibrillation with history of prior heart surgery and continued presence of right atrial dilation and tricuspid regurgitation and referred to EP for further consideration of loop recorder.  She has appointment scheduled today with Dr. Lalla Brothers for further discussion of loop recorder.  No further concerns at this time.  Stroke admission 03/11/2020 [name] is a 51 y.o. female with history of hashimoto thyroiditis and ASD closure 30 years ago who presented to [facility] ED on 03/11/2020 for right arm and leg weakness.  Personally reviewed hospitalization pertinent progress notes, lab work and imaging with summary provided.  Evaluated by Dr. Roda Shutters with stroke work-up revealing right parietal occipital region and left frontal lobe small infarcts s/p tPA, infarcts embolic secondary to unknown source concerning for ASD/PFO related as TEE positive for PFO.  Refer to Dr. Excell Seltzer for outpatient evaluation of PFO.  Hypercoagulable and autoimmune labs pending at discharge.  Recommended DAPT until evaluation with Dr. Excell Seltzer outpatient.  LDL 94 and initiate atorvastatin 20 mg daily. No hx of HTN or DM.  History of Hashimoto thyroiditis not on Synthroid PTA per husband with TSH 6.13 (but FT4 normal), thyroid peroxidase Ab 55 and thyroglobulin Ab 1.5 and advised follow-up with PCP outpatient for further management.  Her stroke risk factors include hx of ASD closure  at age 5 and obesity.  No prior stroke history.  Evaluated by therapies and discharged home in stable condition without therapy needs on 03/14/2020.  Stroke: right parieto-occipital region and left frontal lobe small infarcts, embolic pattern, source uncertain, concerning for ASD/PFO related CT head no acute finding CTA head and neck neg MRI  Brain right parieto-occipital region and left frontal lobe small infartcts MRI C-spine normal cervical spinal cord 2D Echo EF 50-55% TEE unremarkable but positive for PFO TCD bubble study - negative LE venous doppler - negative LDL 94 HgbA1c 5.3 UDS neg hypercoag and autoimmune labs - so far neg, rest pending No indication for LP or heart monitoring at this time - will refer to Dr. Pearlean Jamie for second opinion SCDs for VTE prophylaxis No antithrombotic prior to admission, now on ASA 81 and plavix 75 DAPT for stroke prevention. Continue DAPT until sees Dr. Excell Seltzer as outpt. Patient counseled to be compliant with her antithrombotic medications Ongoing aggressive stroke risk factor management Therapy recommendations:  None Disposition:  Discharge to home      ROS:   14 system review of systems performed and negative with exception  of those listed in HPI  PMH:  Past Medical History:  Diagnosis Date   Hx of migraines    Stroke (HCC)    06/03/20    PSH:  Past Surgical History:  Procedure Laterality Date   BUBBLE STUDY  03/13/2020   Procedure: BUBBLE STUDY;  Surgeon: Quintella Reichert, MD;  Location: [facility] ENDOSCOPY;  Service: Cardiovascular;;   ENDOMETRIAL ABLATION     Uterine/heavy menses   IR ANGIO INTRA EXTRACRAN SEL COM CAROTID INNOMINATE BILAT MOD SED  06/06/2020   IR ANGIO VERTEBRAL SEL VERTEBRAL BILAT MOD SED  06/06/2020   IR CT HEAD LTD  09/25/2020   IR PERCUTANEOUS ART THROMBECTOMY/INFUSION INTRACRANIAL INC DIAG ANGIO  09/25/2020   IR US GUIDE VASC ACCESS RIGHT  06/06/2020   RADIOLOGY WITH ANESTHESIA N/A 09/25/2020   Procedure: RADIOLOGY WITH  ANESTHESIA;  Surgeon: Radiologist, Medication, MD;  Location: [facility] OR;  Service: Radiology;  Laterality: N/A;   SHOULDER SURGERY     Left,/bone spurs   TEE WITHOUT CARDIOVERSION N/A 03/13/2020   Procedure: TRANSESOPHAGEAL ECHOCARDIOGRAM (TEE);  Surgeon: Quintella Reichert, MD;  Location: [facility] ENDOSCOPY;  Service: Cardiovascular;  Laterality: N/A;    Social History:  Social History   Socioeconomic History   Marital status: Married    Spouse name: Not on file   Number of children: Not on file   Years of education: Not on file   Highest education level: Not on file  Occupational History   Occupation: full time  Tobacco Use   Smoking status: Former   Smokeless tobacco: Never  Substance and Sexual Activity   Alcohol use: Yes    Alcohol/week: 1.0 standard drink of alcohol    Types: 1 Cans of beer per week    Comment: states 1 beer every other day or so    Drug use: Never   Sexual activity: Not on file  Other Topics Concern   Not on file  Social History Narrative   Last Mamm: 04.07.10 @ [facility] BI-RADS Category 1: Negative^MM DIGITAL DG BILATERAL   Lives with husband and 2 daughters   Left Handed   Drinks no caffeine   Social Determinants of Health   Financial Resource Strain: Not on file  Food Insecurity: Not on file  Transportation Needs: No Transportation Needs (03/26/2020)   PRAPARE - Administrator, Civil Service (Medical): No    Lack of Transportation (Non-Medical): No  Physical Activity: Not on file  Stress: Not on file  Social Connections: Not on file  Intimate Partner Violence: Not on file    Family History:  Family History  Problem Relation Age of Onset   Hypertension Mother    Hypothyroidism Mother    High Cholesterol Mother    Atrial fibrillation Mother    COPD Father    Emphysema Father    Hypothyroidism Sister     Medications:   Current Outpatient Medications on File Prior to Visit  Medication Sig Dispense Refill   atorvastatin (LIPITOR) 40 MG  tablet Take 1 tablet (40 mg total) by mouth daily. 30 tablet 5   clopidogrel (PLAVIX) 75 MG tablet TAKE 1 TABLET BY MOUTH EVERY DAY 90 tablet 3   Rimegepant Sulfate 75 MG TBDP Take one tablet daily for onset migraine. (max 1 tab / 24 hours) 8 tablet 5   No current facility-administered medications on file prior to visit.    Allergies:   Allergies  Allergen Reactions   Bee Venom Swelling  OBJECTIVE:  Physical Exam  Vitals:   12/15/21 1328  BP: 117/74  Pulse: 65  Weight: 195 lb (88.5 kg)  Height: 5\' 4"  (1.626 m)   Body mass index is 33.47 kg/m. No results found.   General: well developed, well nourished, pleasant middle-age Caucasian female, seated, in no evident distress Head: head normocephalic and atraumatic.   Neck: supple with no carotid or supraclavicular bruits Cardiovascular: regular rate and rhythm, no murmurs Musculoskeletal: no deformity Skin:  no rash/petichiae Vascular:  Normal pulses all extremities   Neurologic Exam Mental Status: Awake and fully alert. Moderate expressive aphasia with fragmented speech and hesitancy. No evidence of dysarthria or receptive aphasia.  Able to speak in short sentences.  Able to follow simple commands without difficulty.  Oriented to place and time. Recent memory impaired and remote memory intact. Attention span, concentration and fund of knowledge appropriate during visit. Mood and affect appropriate.  Cranial Nerves: Pupils equal, briskly reactive to light. Extraocular movements full without nystagmus. Visual fields full to confrontation. Hearing intact. Facial sensation intact.  Face, tongue and palate moves normally and symmetrically.  Motor: Normal bulk and tone. Normal strength in all tested extremity muscles. Sensory.: intact to touch , pinprick , position and vibratory sensation.  Coordination: Rapid alternating movements normal in all extremities. Finger-to-nose and heel-to-shin performed accurately bilaterally. Gait  and Station: Arises from chair without difficulty. Stance is normal. Gait demonstrates normal stride length and balance without use of assistive device.   Reflexes: 1+ and symmetric. Toes downgoing.         ASSESSMENT: [name] is a 51 y.o. 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  Vascular risk factors include history of ASD and s/p closure 30 years ago, HLD,  thyroiditis, former tobacco use and history of menstrual migraines    PLAN:  Recurrent ESUS: Residual deficits: Moderate expressive aphasia, reading/writing difficulty and mild short-term memory difficulties. Completed SLP.  Encouraged continued exercises at home.  Discussion regarding likely new baseline as she is 1 year out from her prior stroke.  Will continue to assist with long-term disability as she will likely have great difficulty returning to work place as a 10/2020 with residual deficits as noted above Continue to follow with [facility] as scheduled in July Continue Plavix and atorvastatin for secondary stroke prevention.   Loop recorder has not shown atrial fibrillation thus far - she questions removal - encouraged continued monitoring especially with history of 3 prior strokes but can further discuss with [facility] for further input as well as cardiology Prior work-up: TCD negative.  TEE no significant IA shunt. Cardiac cath unremarkable.  Cardiac CT no CAD, PFO or IA shunt.  Cerebral angiogram negative for vasculitis.  Hypercoagulable and autoimmune labs unremarkable Discussed secondary stroke prevention measures and importance of close PCP follow up for aggressive stroke risk factor management including HLD with LDL goal<70.      Follow up in 6 months or call earlier if needed   CC:  Tower, August, MD    I spent 34 minutes of face-to-face and non-face-to-face time with patient.  This included previsit chart review, lab review, study review, electronic health record  documentation, patient education and discussion regarding history of prior recurrent strokes and residual deficits, ongoing disability, secondary stroke prevention measures and aggressive stroke risk factor management and answered all other questions to patients satisfaction  Audrie Gallus, [facility]  [facility] [address] [place], [place]  Phone [phone] Fax [phone] Note: This document was prepared with digital dictation and possible smart phrase technology. Any transcriptional errors that result from this process are unintentional.

## 2021-12-15 NOTE — Patient Instructions (Signed)
We will continue to assist with disability   Continue to do exercises at home as recommended by speech therapy   Continue clopidogrel 75 mg daily  and atorvastatin for secondary stroke prevention  Your loop recorder will continue to be monitored by cardiology for possible atrial fibrillation  Continue to follow up with PCP regarding cholesterol and blood pressure management  Maintain strict control of hypertension with blood pressure goal below 130/90 and cholesterol with LDL cholesterol (bad cholesterol) goal below 70 mg/dL.   Signs of a Stroke? Follow the BEFAST method:  Balance Watch for a sudden loss of balance, trouble with coordination or vertigo Eyes Is there a sudden loss of vision in one or both eyes? Or double vision?  Face: Ask the person to smile. Does one side of the face droop or is it numb?  Arms: Ask the person to raise both arms. Does one arm drift downward? Is there weakness or numbness of a leg? Speech: Ask the person to repeat a simple phrase. Does the speech sound slurred/strange? Is the person confused ? Time: If you observe any of these signs, call 911.     Followup in the future with me in 6 months or call earlier if needed       Thank you for coming to see [place] at [facility]. I hope we have been able to provide you high quality care today.  You may receive a patient satisfaction survey over the next few weeks. We would appreciate your feedback and comments so that we may continue to improve ourselves and the health of our patients.

## 2021-12-21 ENCOUNTER — Ambulatory Visit (INDEPENDENT_AMBULATORY_CARE_PROVIDER_SITE_OTHER): Payer: BC Managed Care – PPO

## 2021-12-21 DIAGNOSIS — I639 Cerebral infarction, unspecified: Secondary | ICD-10-CM | POA: Diagnosis not present

## 2021-12-22 LAB — CUP PACEART REMOTE DEVICE CHECK
Date Time Interrogation Session: 20230610230840
Implantable Pulse Generator Implant Date: 20211116

## 2021-12-23 ENCOUNTER — Other Ambulatory Visit: Payer: Self-pay | Admitting: Adult Health

## 2022-01-11 NOTE — Progress Notes (Signed)
Carelink Summary Report / Loop Recorder 

## 2022-01-12 DIAGNOSIS — I6930 Unspecified sequelae of cerebral infarction: Secondary | ICD-10-CM | POA: Diagnosis not present

## 2022-01-13 ENCOUNTER — Telehealth: Payer: Self-pay | Admitting: *Deleted

## 2022-01-13 NOTE — Telephone Encounter (Signed)
Received letter and questionnaire from [employer]. The firm has been retained to represent patient in claim for SS disability and/or supplemental security income. Placed on NP's desk for review.

## 2022-01-13 NOTE — Telephone Encounter (Signed)
Completed and placed in out box.  Thank you. 

## 2022-01-14 NOTE — Telephone Encounter (Signed)
Paperwork sent to medical records for processing.

## 2022-01-18 ENCOUNTER — Ambulatory Visit (INDEPENDENT_AMBULATORY_CARE_PROVIDER_SITE_OTHER): Payer: BC Managed Care – PPO

## 2022-01-18 DIAGNOSIS — I639 Cerebral infarction, unspecified: Secondary | ICD-10-CM

## 2022-01-18 LAB — CUP PACEART REMOTE DEVICE CHECK
Date Time Interrogation Session: 20230713231047
Implantable Pulse Generator Implant Date: 20211116

## 2022-02-01 ENCOUNTER — Other Ambulatory Visit: Payer: Self-pay | Admitting: Adult Health

## 2022-02-18 LAB — CUP PACEART REMOTE DEVICE CHECK
Date Time Interrogation Session: 20230815231023
Implantable Pulse Generator Implant Date: 20211116

## 2022-02-19 NOTE — Progress Notes (Signed)
Carelink Summary Report / Loop Recorder 

## 2022-02-22 ENCOUNTER — Ambulatory Visit (INDEPENDENT_AMBULATORY_CARE_PROVIDER_SITE_OTHER): Payer: BC Managed Care – PPO

## 2022-02-22 DIAGNOSIS — I639 Cerebral infarction, unspecified: Secondary | ICD-10-CM

## 2022-02-22 NOTE — Addendum Note (Signed)
Addended by: Geralyn Flash D on: 02/22/2022 03:44 PM   Modules accepted: Level of Service

## 2022-03-21 NOTE — Progress Notes (Signed)
Carelink Summary Report / Loop Recorder 

## 2022-03-29 ENCOUNTER — Ambulatory Visit (INDEPENDENT_AMBULATORY_CARE_PROVIDER_SITE_OTHER): Payer: BC Managed Care – PPO

## 2022-03-29 DIAGNOSIS — I639 Cerebral infarction, unspecified: Secondary | ICD-10-CM | POA: Diagnosis not present

## 2022-03-29 LAB — CUP PACEART REMOTE DEVICE CHECK
Date Time Interrogation Session: 20230917231400
Implantable Pulse Generator Implant Date: 20211116

## 2022-04-09 NOTE — Progress Notes (Signed)
Carelink Summary Report / Loop Recorder 

## 2022-05-03 ENCOUNTER — Ambulatory Visit (INDEPENDENT_AMBULATORY_CARE_PROVIDER_SITE_OTHER): Payer: BC Managed Care – PPO

## 2022-05-03 DIAGNOSIS — I639 Cerebral infarction, unspecified: Secondary | ICD-10-CM | POA: Diagnosis not present

## 2022-05-03 LAB — CUP PACEART REMOTE DEVICE CHECK
Date Time Interrogation Session: 20231029231405
Implantable Pulse Generator Implant Date: 20211116

## 2022-05-12 ENCOUNTER — Encounter: Payer: Self-pay | Admitting: Adult Health

## 2022-05-18 ENCOUNTER — Telehealth: Payer: Self-pay

## 2022-05-18 NOTE — Telephone Encounter (Signed)
[employer] APS received, placed on NP desk for review.

## 2022-05-18 NOTE — Telephone Encounter (Signed)
Completed and placed in out box.  Thank you. 

## 2022-05-18 NOTE — Telephone Encounter (Signed)
Forms completed, placed in MR for pick up.  

## 2022-06-05 NOTE — Progress Notes (Signed)
Carelink Summary Report / Loop Recorder 

## 2022-06-07 ENCOUNTER — Ambulatory Visit (INDEPENDENT_AMBULATORY_CARE_PROVIDER_SITE_OTHER): Payer: BC Managed Care – PPO

## 2022-06-07 DIAGNOSIS — I639 Cerebral infarction, unspecified: Secondary | ICD-10-CM | POA: Diagnosis not present

## 2022-06-07 LAB — CUP PACEART REMOTE DEVICE CHECK
Date Time Interrogation Session: 20231203232014
Implantable Pulse Generator Implant Date: 20211116

## 2022-06-17 NOTE — Progress Notes (Signed)
[facility] [address] [place]. [place] [address] [phone]       STROKE FOLLOW UP NOTE  Ms. 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 Pt here for 6 mth follow up. States things are well and denies any issues or concerns     HPI:   Update 06/21/2022 JM: Patient returns for 10-month stroke follow-up unaccompanied.  Overall stable.  No new stroke/TIA symptoms. Remains on long-term disability. Continuing to apply for social security disability due to residual deficits of aphasia, short-term memory loss, impaired reading comprehension, and difficulty with typing/writing.  All stable since prior visit without any worsening but denies any improvement.  Able to maintain ADLs and majority of IADLs independently.   Occasional migraine headaches about 4 times per month Use of Nurtec with benefit, usually will take half tablet and will take other half if needed after about 45 minutes.   Compliant on Plavix and atorvastatin Blood pressure well-controlled Loop recorder has not shown atrial fibrillation thus far  F/u with [facility] in June     History provided for reference purposes only Update 12/15/2021 JM: patient returns for 6 month stroke follow up unaccompanied. Overall stable without new stroke/TIA symptoms.  Reports continued word finding difficulty which has been stable since prior visit, can worsen with excitement, stress or fatigue.  Continued difficulty with typing or writing a word (even simple/basic words), impaired reading comprehension, and short-term memory loss. All stable since prior visit.  Completed SLP back in January as she has plateaued.  She does continue to do exercises at home as advised by SLP.  Remains on long-term disability and continues to pursue so  security disability. Maintains ADLs and majority of IADLs independently. Has been driving a little more but still primarily only drives short distance. She tries to avoid highway driving.   Compliant on plavix and atorvastatin, denies side effects Blood pressure today 117/74 Loop recorder has not shown atrial fibrillation thus far - she is interested in having ILR removed  Continues to follow with [facility] - has f/u in July  No further concerns at this time  Update 06/15/2021 JM: Returns for 50-month stroke follow-up unaccompanied   Overall stable -denies new or worsening stroke/TIA symptoms Residual aphasia with gradual improvement since prior visit but reports she was told by SLP she has likely plateaued at this time.  Currently working with SLP 2x/month due to financial reasons. Also reports difficulty with reading and comprehension, difficulty spelling correctly and difficulty with writing and typing.  Remains on long-term disability.  She is currently pursuing Social Security disability.  Able to maintain ADLs independently and able to drive short distances. Prior concerns of worsening aphasia over a 4 hr duration - repeat MR brain no acute/new abnormalities  Compliant on Plavix and atorvastatin -denies side effects Blood pressure today 114/67 Loop recorder has not shown atrial fibrillation thus far  Continues to follow with [facility] - f/u visit scheduled 1/6 Completed cardiac cath 04/06/2021 thru [facility] - per pt, unremarkable -unable to view results via epic  No further concerns at this time  Update 02/03/2021 JM: Ms. Tomson is being seen for 50-month stroke follow-up accompanied by her husband.  She reports having an episode approximately 3 weeks ago with difficulty completing sentences that  lasted approximately 4 hours.  She did not seek medical attention at that time but per husband, speech has been hoarse since that time.  Denies any other associated symptoms such as  confusion or cognitive changes, visual changes, weakness/numbness, balance difficulties or headache.  She was seen by [facility] neurology yesterday who placed order for MR brain. She is also being followed by [facility] cardiology and possibly pursuing intracardiac echo for further evaluation of PFO.  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  She is currently pursuing long-term disability and request further assistance.  Continues to work with SLP but notes continued improvement.  Reports compliance on aspirin and atorvastatin without associated side effects.  Blood pressure today 109/63.  Loop recorder has not shown atrial fibrillation thus far.  No further concerns at this time.  Update 10/30/2020 JM: Mrs. Michalowski returns for stroke follow-up after prior visit with Dr. Pearlean Brownie 4 months ago accompanied by her husband.  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  Recommended aspirin and Brilinta for 4 weeks and aspirin alone.  LDL 23.  A1c 5.4.  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  Discharged home on 09/29/2020 with recommended outpatient PT/SLP.  Continues to work with SLP for residual Broca's aphasia which has been gradually improving.  At times can worsen with increased stress, fatigued or heightened emotions.  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  Denies new stroke/TIA  symptoms.  Completed 4 weeks of Brilinta and has remained on aspirin alone without associated side effects Compliant on atorvastatin 20 mg daily without associated side effects Blood pressure today 112/73 Loop recorder has not shown atrial fibrillation thus far  Both husband and patient are understandably frustrated about continued strokes with unknown cause and lack of further treatment or prevention options.  Discussed Brien Mates trial but she declines interest and questions second opinion from a different neurologist.  Family/friends recommended Jay Schlichter, MD who is a vascular neurologist at [facility].   No further concerns at this time  Update 07/01/2020 Dr. Pearlean Brownie: Patient returns for follow-up after last visit with [name] 6 weeks ago.  She was readmitted to [facility] on 06/03/2020 with sudden onset of left arm weakness and difficulty lifting and heaviness.  This happened a few days after she had switched from dual antiplatelet therapy to aspirin only.  MRI scan showed a right frontal precentral gyrus embolic infarct of cryptogenic etiology.  MRA of the brain and neck showed no large vessel stenosis or occlusion.  She had previously had a cardiac CT in 05/23/2020 which had shown no PFO or shunt and 2D echo and TEE in September which were unremarkable.  Loop interrogation this admission showed no paroxysmal A. fib.  LP was offered but she declined.  Cerebral angiogram was obtained to look for cerebral vasculitis but was negative.  LDL cholesterol was 64 mg percent and hemoglobin A1c was 5.3.  She was restarted  on dual antiplatelet therapy aspirin 81 mg and Plavix 75 mg daily and continued deferred to come back to see me today.  She states she is done well.  Her left arm weakness has resolved completely.  She has slightly diminished fine motor skills in the left hand but no real weakness.  She is tolerating Plavix well without bruising or bleeding as well as Lipitor without muscle aches and  pains.  Her blood pressures well controlled today it is 118/69.  She has no new complaints.  She is eating healthy and losing weight  Hospital follow-up 05/20/2020 JM: Ms. Deloria LairHamilton is being seen for hospital follow-up accompanied by her husband.  She reports initially doing well at discharge but over the past month (approx 1 month post discharge), she has been experiencing word finding difficulties as well as frequent bilateral temporal and occipital headaches.  She does have history of migraines but has not experienced a migraine "in many years".  Headache consistent with throbbing sensation but denies photophobia, phonophobia or nausea/vomiting.  Can occur up to 2-3 times monthly and may last for 3 to 4 days.  She will take Tylenol with some benefit.  She has returned back to work at [employer] without great difficulty but reports even her colleagues have noticed occasional speech difficulty.  She has remained on both aspirin and Plavix without bleeding or bruising as well as atorvastatin 10 mg daily without myalgias.  Further review of hypercoagulable and autoimmune labs pending at discharge unremarkable.  She was evaluated by Dr. Excell Seltzerooper on 05/12/2020 and personally reviewed the office note. Per Dr. Excell Seltzerooper, he reviewed TEE and I did not appreciate any clear evidence of PFO and TCD negative therefore recommended pursuing cardiac CTA to further assess for cardiac anomalies which is scheduled for this Friday.  He believes increased risk of atrial fibrillation with history of prior heart surgery and continued presence of right atrial dilation and tricuspid regurgitation and referred to EP for further consideration of loop recorder.  She has appointment scheduled today with Dr. Lalla BrothersLambert for further discussion of loop recorder.  No further concerns at this time.  Stroke admission 03/11/2020 Ms. Aizley Nelda MarseilleL Alonso is a 51 y.o. female with history of hashimoto thyroiditis and ASD closure 30 years ago who  presented [facility] ED on 03/11/2020 for right arm and leg weakness.  Personally reviewed hospitalization pertinent progress notes, lab work and imaging with summary provided.  Evaluated by Dr. Roda ShuttersXu with stroke work-up revealing right parietal occipital region and left frontal lobe small infarcts s/p tPA, infarcts embolic secondary to unknown source concerning for ASD/PFO related as TEE positive for PFO.  Refer to Dr. Excell Seltzerooper for outpatient evaluation of PFO.  Hypercoagulable and autoimmune labs pending at discharge.  Recommended DAPT until evaluation with Dr. Excell Seltzerooper outpatient.  LDL 94 and initiate atorvastatin 20 mg daily. No hx of HTN or DM.  History of Hashimoto thyroiditis not on Synthroid PTA per husband with TSH 6.13 (but FT4 normal), thyroid peroxidase Ab 55 and thyroglobulin Ab 1.5 and advised follow-up with PCP outpatient for further management.  Her stroke risk factors include hx of ASD closure at age 51 and obesity.  No prior stroke history.  Evaluated by therapies and discharged home in stable condition without therapy needs on 03/14/2020.  Stroke: right parieto-occipital region and left frontal lobe small infarcts, embolic pattern, source uncertain, concerning for ASD/PFO related CT head no acute finding CTA head and neck neg MRI  Brain right parieto-occipital region and left  frontal lobe small infartcts MRI C-spine normal cervical spinal cord 2D Echo EF 50-55% TEE unremarkable but positive for PFO TCD bubble study - negative LE venous doppler - negative LDL 94 HgbA1c 5.3 UDS neg hypercoag and autoimmune labs - so far neg, rest pending No indication for LP or heart monitoring at this time - will refer to Dr. Pearlean Brownie for second opinion SCDs for VTE prophylaxis No antithrombotic prior to admission, now on ASA 81 and plavix 75 DAPT for stroke prevention. Continue DAPT until sees Dr. Excell Seltzer as outpt. Patient counseled to be compliant with her antithrombotic medications Ongoing aggressive stroke  risk factor management Therapy recommendations:  None Disposition:  Discharge to home      ROS:   14 system review of systems performed and negative with exception of those listed in HPI  PMH:  Past Medical History:  Diagnosis Date   Hx of migraines    Stroke (HCC)    06/03/20    PSH:  Past Surgical History:  Procedure Laterality Date   BUBBLE STUDY  03/13/2020   Procedure: BUBBLE STUDY;  Surgeon: Quintella Reichert, MD;  Location: [facility] ENDOSCOPY;  Service: Cardiovascular;;   ENDOMETRIAL ABLATION     Uterine/heavy menses   IR ANGIO INTRA EXTRACRAN SEL COM CAROTID INNOMINATE BILAT MOD SED  06/06/2020   IR ANGIO VERTEBRAL SEL VERTEBRAL BILAT MOD SED  06/06/2020   IR CT HEAD LTD  09/25/2020   IR PERCUTANEOUS ART THROMBECTOMY/INFUSION INTRACRANIAL INC DIAG ANGIO  09/25/2020   IR US GUIDE VASC ACCESS RIGHT  06/06/2020   RADIOLOGY WITH ANESTHESIA N/A 09/25/2020   Procedure: RADIOLOGY WITH ANESTHESIA;  Surgeon: Radiologist, Medication, MD;  Location: [facility] OR;  Service: Radiology;  Laterality: N/A;   SHOULDER SURGERY     Left,/bone spurs   TEE WITHOUT CARDIOVERSION N/A 03/13/2020   Procedure: TRANSESOPHAGEAL ECHOCARDIOGRAM (TEE);  Surgeon: Quintella Reichert, MD;  Location: [facility]COPY;  Service: Cardiovascular;  Laterality: N/A;    Social History:  Social History   Socioeconomic History   Marital status: Married    Spouse name: Not on file   Number of children: Not on file   Years of education: Not on file   Highest education level: Not on file  Occupational History   Occupation: full time  Tobacco Use   Smoking status: Former   Smokeless tobacco: Never  Substance and Sexual Activity   Alcohol use: Yes    Alcohol/week: 1.0 standard drink of alcohol    Types: 1 Cans of beer per week    Comment: states 1 beer every other day or so    Drug use: Never   Sexual activity: Not on file  Other Topics Concern   Not on file  Social History Narrative   Last Mamm: 04.07.10 @ [facility]  Category 1: Negative^MM DIGITAL DG BILATERAL   Lives with husband and 2 daughters   Left Handed   Drinks no caffeine   Social Determinants of Health   Financial Resource Strain: Not on file  Food Insecurity: Not on file  Transportation Needs: No Transportation Needs (03/26/2020)   PRAPARE - Administrator, Civil Service (Medical): No    Lack of Transportation (Non-Medical): No  Physical Activity: Not on file  Stress: Not on file  Social Connections: Not on file  Intimate Partner Violence: Not on file    Family History:  Family History  Problem Relation Age of Onset   Hypertension Mother    Hypothyroidism Mother  High Cholesterol Mother    Atrial fibrillation Mother    COPD Father    Emphysema Father    Hypothyroidism Sister     Medications:   Current Outpatient Medications on File Prior to Visit  Medication Sig Dispense Refill   atorvastatin (LIPITOR) 40 MG tablet TAKE 1 TABLET BY MOUTH EVERY DAY 90 tablet 1   clopidogrel (PLAVIX) 75 MG tablet TAKE 1 TABLET BY MOUTH EVERY DAY 90 tablet 3   NURTEC 75 MG TBDP TAKE ONE TABLET DAILY FOR ONSET MIGRAINE. (MAX 1 TAB / 24 HOURS) 8 tablet 5   No current facility-administered medications on file prior to visit.    Allergies:   Allergies  Allergen Reactions   Bee Venom Swelling      OBJECTIVE:  Physical Exam  Vitals:   06/21/22 1038  BP: 112/76  Pulse: 77  Weight: 192 lb (87.1 kg)  Height: 5\' 4"  (1.626 m)   Body mass index is 32.96 kg/m. No results found.   General: well developed, well nourished, pleasant middle-age Caucasian female, seated, in no evident distress Head: head normocephalic and atraumatic.   Neck: supple with no carotid or supraclavicular bruits Cardiovascular: regular rate and rhythm, no murmurs Musculoskeletal: no deformity Skin:  no rash/petichiae Vascular:  Normal pulses all extremities   Neurologic Exam Mental Status: Awake and fully alert. Moderate expressive aphasia  with fragmented speech and hesitancy. No evidence of dysarthria or receptive aphasia.  Able to speak in short sentences.  Able to follow simple commands without difficulty.  Oriented to place and time. Recent memory impaired and remote memory intact. Attention span, concentration and fund of knowledge appropriate during visit. Mood and affect appropriate.  Cranial Nerves: Pupils equal, briskly reactive to light. Extraocular movements full without nystagmus. Visual fields full to confrontation. Hearing intact. Facial sensation intact.  Face, tongue and palate moves normally and symmetrically.  Motor: Normal bulk and tone. Normal strength in all tested extremity muscles. Sensory.: intact to touch , pinprick , position and vibratory sensation.  Coordination: Rapid alternating movements normal in all extremities. Finger-to-nose and heel-to-shin performed accurately bilaterally. Gait and Station: Arises from chair without difficulty. Stance is normal. Gait demonstrates normal stride length and balance without use of assistive device.   Reflexes: 1+ and symmetric. Toes downgoing.         ASSESSMENT: IMONI KOHEN is a 51 y.o. 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  Vascular risk factors include history of ASD and s/p closure 30 years ago, HLD,  thyroiditis, former tobacco use and history of menstrual migraines    PLAN:  Recurrent ESUS: Residual deficits: Moderate expressive aphasia, reading/writing difficulty and mild short-term memory difficulties. Has plateaued in regards to recovery per SLP.  Encouraged continued exercises at home.  Discussion regarding likely new baseline as she is over 1 year out from her prior stroke.  Will continue to assist with long-term disability as she will likely have great difficulty returning to work place as a 10/2020 with residual deficits as noted above Continue to follow with [facility] as scheduled in July Continue Plavix and  atorvastatin for secondary stroke prevention.   Loop recorder has not shown atrial fibrillation thus far  Prior work-up: TCD negative.  TEE no significant IA shunt. Cardiac cath unremarkable.  Cardiac CT no CAD, PFO or IA shunt.  Cerebral angiogram negative for vasculitis.  Hypercoagulable and autoimmune labs unremarkable Discussed secondary stroke prevention measures and importance of close PCP follow up for aggressive  stroke risk factor management including HLD with LDL goal<70.      Follow up in 8 months or call earlier if needed    CC:  Tower, Audrie Gallus, MD    I spent 31 minutes of face-to-face and non-face-to-face time with patient.  This included previsit chart review, lab review, study review, electronic health record documentation, patient education and discussion regarding above diagnoses and treatment plan and answered all the questions to patient's satisfaction  Ihor Austin, [facility]  [facility] [address] [place], [place] [address]  Phone [phone] Fax [phone] Note: This document was prepared with digital dictation and possible smart phrase technology. Any transcriptional errors that result from this process are unintentional.

## 2022-06-21 ENCOUNTER — Ambulatory Visit (INDEPENDENT_AMBULATORY_CARE_PROVIDER_SITE_OTHER): Payer: BC Managed Care – PPO | Admitting: Adult Health

## 2022-06-21 ENCOUNTER — Encounter: Payer: Self-pay | Admitting: Adult Health

## 2022-06-21 VITALS — BP 112/76 | HR 77 | Ht 64.0 in | Wt 192.0 lb

## 2022-06-21 DIAGNOSIS — I639 Cerebral infarction, unspecified: Secondary | ICD-10-CM

## 2022-06-21 MED ORDER — NURTEC 75 MG PO TBDP: 75.0000 mg | ORAL_TABLET | ORAL | 11 refills | Status: DC | PRN

## 2022-06-21 NOTE — Patient Instructions (Addendum)
Continue clopidogrel 75 mg daily  and atorvastatin for secondary stroke prevention  Continue Nurtec as needed for acute migraines - please let me know if migraine should worsen   Loop recorder has not shown atrial fibrillation thus far - will continue to be monitored by cardiology  Continue to follow up with PCP regarding blood pressure and cholesterol management  Maintain strict control of hypertension with blood pressure goal below 130/90 and cholesterol with LDL cholesterol (bad cholesterol) goal below 70 mg/dL.   Signs of a Stroke? Follow the BEFAST method:  Balance Watch for a sudden loss of balance, trouble with coordination or vertigo Eyes Is there a sudden loss of vision in one or both eyes? Or double vision?  Face: Ask the person to smile. Does one side of the face droop or is it numb?  Arms: Ask the person to raise both arms. Does one arm drift downward? Is there weakness or numbness of a leg? Speech: Ask the person to repeat a simple phrase. Does the speech sound slurred/strange? Is the person confused ? Time: If you observe any of these signs, call 911.     Followup in the future with me in 8 months or call earlier if needed      Thank you for coming to see [place] at [facility]. I hope we have been able to provide you high quality care today.  You may receive a patient satisfaction survey over the next few weeks. We would appreciate your feedback and comments so that we may continue to improve ourselves and the health of our patients.

## 2022-07-12 ENCOUNTER — Ambulatory Visit (INDEPENDENT_AMBULATORY_CARE_PROVIDER_SITE_OTHER): Payer: BC Managed Care – PPO

## 2022-07-12 DIAGNOSIS — I639 Cerebral infarction, unspecified: Secondary | ICD-10-CM

## 2022-07-13 LAB — CUP PACEART REMOTE DEVICE CHECK
Date Time Interrogation Session: 20240107232302
Implantable Pulse Generator Implant Date: 20211116

## 2022-07-15 ENCOUNTER — Telehealth: Payer: Self-pay | Admitting: Adult Health

## 2022-07-15 NOTE — Telephone Encounter (Signed)
Called pt. She said she doesn't know who Dr. Lavone Neri is. Stated [name] fills out her disability paper and don't know why another doctor would be calling.

## 2022-07-15 NOTE — Progress Notes (Signed)
Carelink Summary Report / Loop Recorder 

## 2022-07-15 NOTE — Telephone Encounter (Signed)
Noted  

## 2022-07-15 NOTE — Telephone Encounter (Signed)
Shayna from Dr. Austin Miles office called stating that the pt has filed for disability and the provider is requesting a Peer to Peer. Please advise.

## 2022-07-15 NOTE — Telephone Encounter (Signed)
We have been completing patient's disability paperwork over the past year. Unsure who Dr. Lavone Neri is. Can this place be looked into? Thank you.

## 2022-07-15 NOTE — Telephone Encounter (Signed)
Unaware of who Dr Lavone Neri is, We have been completing patient's disability paperwork over the past year. No incoming number documented to call back. Please contact pt to see who he is so we can try to figure out what is needed

## 2022-08-15 LAB — CUP PACEART REMOTE DEVICE CHECK
Date Time Interrogation Session: 20240209231214
Implantable Pulse Generator Implant Date: 20211116

## 2022-08-16 ENCOUNTER — Ambulatory Visit: Payer: BC Managed Care – PPO

## 2022-08-16 DIAGNOSIS — I639 Cerebral infarction, unspecified: Secondary | ICD-10-CM | POA: Diagnosis not present

## 2022-08-16 NOTE — Progress Notes (Signed)
Carelink Summary Report / Loop Recorder 

## 2022-08-31 ENCOUNTER — Encounter: Payer: Self-pay | Admitting: Adult Health

## 2022-09-16 ENCOUNTER — Ambulatory Visit: Payer: BC Managed Care – PPO

## 2022-09-16 DIAGNOSIS — I639 Cerebral infarction, unspecified: Secondary | ICD-10-CM

## 2022-09-17 LAB — CUP PACEART REMOTE DEVICE CHECK
Date Time Interrogation Session: 20240313231011
Implantable Pulse Generator Implant Date: 20211116

## 2022-09-23 ENCOUNTER — Encounter: Payer: Self-pay | Admitting: Neurology

## 2022-09-28 NOTE — Progress Notes (Signed)
Carelink Summary Report / Loop Recorder 

## 2022-10-19 ENCOUNTER — Ambulatory Visit (INDEPENDENT_AMBULATORY_CARE_PROVIDER_SITE_OTHER): Payer: BC Managed Care – PPO

## 2022-10-19 DIAGNOSIS — I639 Cerebral infarction, unspecified: Secondary | ICD-10-CM

## 2022-10-19 LAB — CUP PACEART REMOTE DEVICE CHECK
Date Time Interrogation Session: 20240415231116
Implantable Pulse Generator Implant Date: 20211116

## 2022-10-19 NOTE — Progress Notes (Signed)
Carelink Summary Report / Loop Recorder 

## 2022-10-28 DIAGNOSIS — Z0271 Encounter for disability determination: Secondary | ICD-10-CM

## 2022-11-22 ENCOUNTER — Ambulatory Visit (INDEPENDENT_AMBULATORY_CARE_PROVIDER_SITE_OTHER): Payer: BC Managed Care – PPO

## 2022-11-22 DIAGNOSIS — I639 Cerebral infarction, unspecified: Secondary | ICD-10-CM | POA: Diagnosis not present

## 2022-11-22 LAB — CUP PACEART REMOTE DEVICE CHECK
Date Time Interrogation Session: 20240518230359
Implantable Pulse Generator Implant Date: 20211116

## 2022-11-22 NOTE — Progress Notes (Signed)
Carelink Summary Report / Loop Recorder 

## 2022-12-04 ENCOUNTER — Other Ambulatory Visit: Payer: Self-pay | Admitting: Adult Health

## 2022-12-20 NOTE — Progress Notes (Signed)
Carelink Summary Report / Loop Recorder 

## 2022-12-27 ENCOUNTER — Ambulatory Visit (INDEPENDENT_AMBULATORY_CARE_PROVIDER_SITE_OTHER): Payer: BC Managed Care – PPO

## 2022-12-27 DIAGNOSIS — I639 Cerebral infarction, unspecified: Secondary | ICD-10-CM

## 2022-12-27 LAB — CUP PACEART REMOTE DEVICE CHECK
Date Time Interrogation Session: 20240623231007
Implantable Pulse Generator Implant Date: 20211116

## 2023-01-14 NOTE — Progress Notes (Signed)
Carelink Summary Report / Loop Recorder 

## 2023-01-31 ENCOUNTER — Ambulatory Visit (INDEPENDENT_AMBULATORY_CARE_PROVIDER_SITE_OTHER): Payer: BC Managed Care – PPO

## 2023-01-31 DIAGNOSIS — I639 Cerebral infarction, unspecified: Secondary | ICD-10-CM | POA: Diagnosis not present

## 2023-02-16 NOTE — Progress Notes (Signed)
Carelink Summary Report / Loop Recorder 

## 2023-02-17 NOTE — Progress Notes (Signed)
[facility] [address] [place]. 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 Pt is here for stroke follow up. Pt states she has been doing well. No new concerns.      HPI:   Update 02/21/2023 JM: Returns for follow-up visit unaccompanied.  Doing well from stroke standpoint without new stroke/TIA symptoms.  Residual deficits stable.  Remains on long-term disability, in the process of applying for Social Security disability, recently did speech/cognitive testing, waiting to hear results.  Compliant on Plavix and atorvastatin.  Loop recorder has not shown atrial fibrillation thus far.  Reports continued 4-5 migraine days per month, use of Nurtec with benefit.  Has not had recent follow-up with PCP.     History provided for reference purposes only Update 06/21/2022 JM: Patient returns for 13-month stroke follow-up unaccompanied.  Overall stable.  No new stroke/TIA symptoms. Remains on long-term disability. Continuing to apply for social security disability due to residual deficits of aphasia, short-term memory loss, impaired reading comprehension, and difficulty with typing/writing.  All stable since prior visit without any worsening but denies any improvement.  Able to maintain ADLs and majority of IADLs independently.   Occasional migraine headaches about 4 times per month Use of Nurtec with benefit, usually will take half tablet and will take other half if needed after about 45 minutes.   Compliant on Plavix and atorvastatin Blood pressure well-controlled Loop recorder has not shown atrial fibrillation thus far  F/u with [facility] in June   Update 12/15/2021 JM: patient returns for 6 month stroke follow up  unaccompanied. Overall stable without new stroke/TIA symptoms.  Reports continued word finding difficulty which has been stable since prior visit, can worsen with excitement, stress or fatigue.  Continued difficulty with typing or writing a word (even simple/basic words), impaired reading comprehension, and short-term memory loss. All stable since prior visit.  Completed SLP back in January as she has plateaued.  She does continue to do exercises at home as advised by SLP.  Remains on long-term disability and continues to pursue so security disability. Maintains ADLs and majority of IADLs independently. Has been driving a little more but still primarily only drives short distance. She tries to avoid highway driving.   Compliant on plavix and atorvastatin, denies side effects Blood pressure today 117/74 Loop recorder has not shown atrial fibrillation thus far - she is interested in having ILR removed  Continues to follow with [facility] - has f/u in July  No further concerns at this time  Update 06/15/2021 JM: Returns for 26-month stroke follow-up unaccompanied   Overall stable -denies new or worsening stroke/TIA symptoms Residual aphasia with gradual improvement since prior visit but reports she was told by SLP she has likely plateaued at this time.  Currently working with SLP 2x/month due to financial reasons. Also reports difficulty with reading and comprehension, difficulty spelling correctly and difficulty with writing and typing.  Remains on long-term disability.  She is currently pursuing Social Security disability.  Able to maintain ADLs independently and able to drive short distances. Prior concerns of worsening aphasia over a 4 hr duration - repeat MR brain no acute/new abnormalities  Compliant on Plavix and  atorvastatin -denies side effects Blood pressure today 114/67 Loop recorder has not shown atrial fibrillation thus far  Continues to follow with [facility] - f/u visit  scheduled 1/6 Completed cardiac cath 04/06/2021 thru [facility] - per pt, unremarkable -unable to view results via epic  No further concerns at this time  Update 02/03/2021 JM: [name] is being seen for 13-month stroke follow-up accompanied by her husband.  She reports having an episode approximately 3 weeks ago with difficulty completing sentences that lasted approximately 4 hours.  She did not seek medical attention at that time but per husband, speech has been hoarse since that time.  Denies any other associated symptoms such as confusion or cognitive changes, visual changes, weakness/numbness, balance difficulties or headache.  She was seen by [facility] neurology yesterday who placed order for MR brain. She is also being followed by [facility] cardiology and possibly pursuing intracardiac echo for further evaluation of PFO.  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  She is currently pursuing long-term disability and request further assistance.  Continues to work with SLP but notes continued improvement.  Reports compliance on aspirin and atorvastatin without associated side effects.  Blood pressure today 109/63.  Loop recorder has not shown atrial fibrillation thus far.  No further concerns at this time.  Update 10/30/2020 JM: [name] returns for stroke follow-up after prior visit with Dr. Pearlean Brownie 4 months ago accompanied by her husband.  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  Recommended aspirin and Brilinta for 4 weeks and aspirin alone.  LDL 23.  A1c 5.4.  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  Discharged home on 09/29/2020 with recommended outpatient PT/SLP.  Continues to work with SLP for residual Broca's aphasia which has been gradually improving.  At times can worsen with increased stress, fatigued or heightened emotions.  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  Denies new stroke/TIA symptoms.  Completed 4 weeks of Brilinta and has remained on aspirin alone without associated side effects Compliant on atorvastatin 20 mg daily without associated side effects Blood pressure today 112/73 Loop recorder has not shown atrial fibrillation thus far  Both husband and patient are understandably frustrated about continued strokes with unknown cause and lack of further treatment or prevention options.  Discussed Brien Mates trial but she declines interest and questions second opinion from a different neurologist.  Family/friends recommended Jay Schlichter, MD who is a vascular neurologist at [facility].   No further concerns at this time  Update 07/01/2020 Dr. Pearlean Brownie: Patient returns for follow-up after last visit with [name] 6 weeks ago.  She was readmitted to [facility] on 06/03/2020 with sudden onset of left arm weakness and difficulty lifting and heaviness.  This happened a few days after she had switched from dual antiplatelet therapy to aspirin only.  MRI scan showed a right frontal precentral gyrus embolic infarct of cryptogenic etiology.  MRA of  the brain and neck showed no large vessel stenosis or occlusion.  She had previously had a cardiac CT in 05/23/2020 which had shown no PFO or shunt and 2D echo and TEE in September which were unremarkable.  Loop interrogation this admission showed no paroxysmal A. fib.  LP was offered but she declined.  Cerebral angiogram was obtained to  look for cerebral vasculitis but was negative.  LDL cholesterol was 64 mg percent and hemoglobin A1c was 5.3.  She was restarted on dual antiplatelet therapy aspirin 81 mg and Plavix 75 mg daily and continued deferred to come back to see me today.  She states she is done well.  Her left arm weakness has resolved completely.  She has slightly diminished fine motor skills in the left hand but no real weakness.  She is tolerating Plavix well without bruising or bleeding as well as Lipitor without muscle aches and pains.  Her blood pressures well controlled today it is 118/69.  She has no new complaints.  She is eating healthy and losing weight  Hospital follow-up 05/20/2020 JM: Ms. Coppock is being seen for hospital follow-up accompanied by her husband.  She reports initially doing well at discharge but over the past month (approx 1 month post discharge), she has been experiencing word finding difficulties as well as frequent bilateral temporal and occipital headaches.  She does have history of migraines but has not experienced a migraine "in many years".  Headache consistent with throbbing sensation but denies photophobia, phonophobia or nausea/vomiting.  Can occur up to 2-3 times monthly and may last for 3 to 4 days.  She will take Tylenol with some benefit.  She has returned back to work at [employer] dermatology without great difficulty but reports even her colleagues have noticed occasional speech difficulty.  She has remained on both aspirin and Plavix without bleeding or bruising as well as atorvastatin 10 mg daily without myalgias.  Further review of hypercoagulable and autoimmune labs pending at discharge unremarkable.  She was evaluated by Dr. Excell Seltzer on 05/12/2020 and personally reviewed the office note. Per Dr. Excell Seltzer, he reviewed TEE and I did not appreciate any clear evidence of PFO and TCD negative therefore recommended pursuing cardiac CTA to further assess for cardiac anomalies which is scheduled for  this Friday.  He believes increased risk of atrial fibrillation with history of prior heart surgery and continued presence of right atrial dilation and tricuspid regurgitation and referred to EP for further consideration of loop recorder.  She has appointment scheduled today with Dr. Lalla Brothers for further discussion of loop recorder.  No further concerns at this time.  Stroke admission 03/11/2020 Ms. [name] [name] Gutierrez is a 52 y.o. female with history of hashimoto thyroiditis and ASD closure 30 years ago who presented to [facility] ED on 03/11/2020 for right arm and leg weakness.  Personally reviewed hospitalization pertinent progress notes, lab work and imaging with summary provided.  Evaluated by Dr. Roda Shutters with stroke work-up revealing right parietal occipital region and left frontal lobe small infarcts s/p tPA, infarcts embolic secondary to unknown source concerning for ASD/PFO related as TEE positive for PFO.  Refer to Dr. Excell Seltzer for outpatient evaluation of PFO.  Hypercoagulable and autoimmune labs pending at discharge.  Recommended DAPT until evaluation with Dr. Excell Seltzer outpatient.  LDL 94 and initiate atorvastatin 20 mg daily. No hx of HTN or DM.  History of Hashimoto thyroiditis not on Synthroid PTA per husband with TSH 6.13 (but FT4 normal), thyroid peroxidase Ab 55 and  thyroglobulin Ab 1.5 and advised follow-up with PCP outpatient for further management.  Her stroke risk factors include hx of ASD closure at age 62 and obesity.  No prior stroke history.  Evaluated by therapies and discharged home in stable condition without therapy needs on 03/14/2020.  Stroke: right parieto-occipital region and left frontal lobe small infarcts, embolic pattern, source uncertain, concerning for ASD/PFO related CT head no acute finding CTA head and neck neg MRI  Brain right parieto-occipital region and left frontal lobe small infartcts MRI C-spine normal cervical spinal cord 2D Echo EF 50-55% TEE unremarkable but positive for PFO TCD  bubble study - negative LE venous doppler - negative LDL 94 HgbA1c 5.3 UDS neg hypercoag and autoimmune labs - so far neg, rest pending No indication for LP or heart monitoring at this time - will refer to Dr. Pearlean Brownie for second opinion SCDs for VTE prophylaxis No antithrombotic prior to admission, now on ASA 81 and plavix 75 DAPT for stroke prevention. Continue DAPT until sees Dr. Excell Seltzer as outpt. Patient counseled to be compliant with her antithrombotic medications Ongoing aggressive stroke risk factor management Therapy recommendations:  None Disposition:  Discharge to home      ROS:   14 system review of systems performed and negative with exception of those listed in HPI  PMH:  Past Medical History:  Diagnosis Date   Hx of migraines    Stroke (HCC)    06/03/20    PSH:  Past Surgical History:  Procedure Laterality Date   BUBBLE STUDY  03/13/2020   Procedure: BUBBLE STUDY;  Surgeon: Quintella Reichert, MD;  Location: [facility] ENDOSCOPY;  Service: Cardiovascular;;   ENDOMETRIAL ABLATION     Uterine/heavy menses   IR ANGIO INTRA EXTRACRAN SEL COM CAROTID INNOMINATE BILAT MOD SED  06/06/2020   IR ANGIO VERTEBRAL SEL VERTEBRAL BILAT MOD SED  06/06/2020   IR CT HEAD LTD  09/25/2020   IR PERCUTANEOUS ART THROMBECTOMY/INFUSION INTRACRANIAL INC DIAG ANGIO  09/25/2020   IR US GUIDE VASC ACCESS RIGHT  06/06/2020   RADIOLOGY WITH ANESTHESIA N/A 09/25/2020   Procedure: RADIOLOGY WITH ANESTHESIA;  Surgeon: Radiologist, Medication, MD;  Location: [facility] OR;  Service: Radiology;  Laterality: N/A;   SHOULDER SURGERY     Left,/bone spurs   TEE WITHOUT CARDIOVERSION N/A 03/13/2020   Procedure: TRANSESOPHAGEAL ECHOCARDIOGRAM (TEE);  Surgeon: Quintella Reichert, MD;  Location: [facility] ENDOSCOPY;  Service: Cardiovascular;  Laterality: N/A;    Social History:  Social History   Socioeconomic History   Marital status: Married    Spouse name: Not on file   Number of children: Not on file   Years of education: Not on  file   Highest education level: Not on file  Occupational History   Occupation: full time  Tobacco Use   Smoking status: Former   Smokeless tobacco: Never  Substance and Sexual Activity   Alcohol use: Yes    Alcohol/week: 1.0 standard drink of alcohol    Types: 1 Cans of beer per week    Comment: states 1 beer every other day or so    Drug use: Never   Sexual activity: Not on file  Other Topics Concern   Not on file  Social History Narrative   Last Mamm: 04.07.10 @ [facility] BI-RADS Category 1: Negative^MM DIGITAL DG BILATERAL   Lives with husband and 2 daughters   Left Handed   Drinks no caffeine   Social Determinants of Health   Financial Resource Strain: Not on  file  Food Insecurity: Not on file  Transportation Needs: No Transportation Needs (03/26/2020)   PRAPARE - Administrator, Civil Service (Medical): No    Lack of Transportation (Non-Medical): No  Physical Activity: Not on file  Stress: Not on file  Social Connections: Not on file  Intimate Partner Violence: Not on file    Family History:  Family History  Problem Relation Age of Onset   Hypertension Mother    Hypothyroidism Mother    High Cholesterol Mother    Atrial fibrillation Mother    COPD Father    Emphysema Father    Hypothyroidism Sister     Medications:   Current Outpatient Medications on File Prior to Visit  Medication Sig Dispense Refill   atorvastatin (LIPITOR) 40 MG tablet TAKE 1 TABLET BY MOUTH EVERY DAY 90 tablet 1   clopidogrel (PLAVIX) 75 MG tablet TAKE 1 TABLET BY MOUTH EVERY DAY 90 tablet 3   Rimegepant Sulfate (NURTEC) 75 MG TBDP Take 75 mg by mouth as needed (acute migraine). 16 tablet 11   No current facility-administered medications on file prior to visit.    Allergies:   Allergies  Allergen Reactions   Bee Venom Swelling      OBJECTIVE:  Physical Exam  Vitals:   02/21/23 1037  BP: 120/72  Pulse: 77  Weight: 201 lb (91.2 kg)  Height: 5\' 3"  (1.6 m)     Body mass index is 35.61 kg/m. No results found.   General: well developed, well nourished, pleasant middle-age Caucasian female, seated, in no evident distress Head: head normocephalic and atraumatic.   Neck: supple with no carotid or supraclavicular bruits Cardiovascular: regular rate and rhythm, no murmurs Musculoskeletal: no deformity Skin:  no rash/petichiae Vascular:  Normal pulses all extremities   Neurologic Exam Mental Status: Awake and fully alert. Moderate expressive aphasia with fragmented speech and hesitancy. No evidence of dysarthria or receptive aphasia.  Able to speak in short sentences.  Able to follow simple commands without difficulty.  Oriented to place and time. Recent memory impaired and remote memory intact. Attention span, concentration and fund of knowledge appropriate during visit. Mood and affect appropriate.  Cranial Nerves: Pupils equal, briskly reactive to light. Extraocular movements full without nystagmus. Visual fields full to confrontation. Hearing intact. Facial sensation intact.  Face, tongue and palate moves normally and symmetrically.  Motor: Normal bulk and tone. Normal strength in all tested extremity muscles. Sensory.: intact to touch , pinprick , position and vibratory sensation.  Coordination: Rapid alternating movements normal in all extremities. Finger-to-nose and heel-to-shin performed accurately bilaterally. Gait and Station: Arises from chair without difficulty. Stance is normal. Gait demonstrates normal stride length and balance without use of assistive device.   Reflexes: 1+ and symmetric. Toes downgoing.         ASSESSMENT: [name] [name] Gutierrez is a 52 y.o. 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  Vascular risk factors include history of ASD and s/p closure 30 years ago, HLD,  thyroiditis, former tobacco use and history of menstrual migraines    PLAN:  Recurrent ESUS: Residual deficits: Moderate expressive  aphasia, reading/writing difficulty and mild short-term memory difficulties. Has plateaued in regards to recovery per SLP.  Encouraged continued exercises at home.  Discussion regarding likely new baseline as she is over 1 year out from her prior stroke.  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  Refills provided.  Loop recorder has not shown atrial fibrillation thus far -monitored by cardiology Prior work-up: TCD negative.  TEE no significant IA shunt. Cardiac cath unremarkable.  Cardiac CT no CAD, PFO or IA shunt.  Cerebral angiogram negative for vasculitis.  Hypercoagulable and autoimmune labs unremarkable Discussed secondary stroke prevention measures and advised to schedule follow-up visit PCP for aggressive stroke risk factor management including HLD with LDL goal<70. Will obtain lipid panel and CMP today  Chronic migraines: Currently experiencing 4-5 migraines per month.  Continue Nurtec as needed -refill provided.  No indication for prophylactic therapy.  Advised to call with any worsening migraine headaches     Follow up in 1 year or call earlier if needed    CC:  Tower, Audrie Gallus, MD    I spent 30 minutes of face-to-face and non-face-to-face time with patient.  This included previsit chart review, lab review, study review, electronic health record documentation, patient education and discussion regarding above diagnoses and treatment plan and answered all the questions to patient's satisfaction  Ihor Austin, [facility]  [facility] [address] [place], [place] [address]  Phone [phone] Fax [phone] Note: This document was prepared with digital dictation and possible smart phrase technology. Any  transcriptional errors that result from this process are unintentional.

## 2023-02-21 ENCOUNTER — Encounter: Payer: Self-pay | Admitting: Adult Health

## 2023-02-21 ENCOUNTER — Ambulatory Visit: Payer: BC Managed Care – PPO | Admitting: Adult Health

## 2023-02-21 VITALS — BP 120/72 | HR 77 | Ht 63.0 in | Wt 201.0 lb

## 2023-02-21 DIAGNOSIS — E785 Hyperlipidemia, unspecified: Secondary | ICD-10-CM | POA: Diagnosis not present

## 2023-02-21 MED ORDER — NURTEC 75 MG PO TBDP: 75.0000 mg | ORAL_TABLET | ORAL | 11 refills | Status: DC | PRN

## 2023-02-21 MED ORDER — CLOPIDOGREL BISULFATE 75 MG PO TABS: 75.0000 mg | ORAL_TABLET | Freq: Every day | ORAL | 3 refills | Status: DC

## 2023-02-21 MED ORDER — ATORVASTATIN CALCIUM 40 MG PO TABS: 40.0000 mg | ORAL_TABLET | Freq: Every day | ORAL | 3 refills | Status: DC

## 2023-02-21 NOTE — Patient Instructions (Addendum)
Continue Nurtec for migraine rescue - please call with any worsening migraine headaches   We will check lab work today - you will see the results tomorrow on MyChart   Continue clopidogrel 75 mg daily  and atorvastatin for secondary stroke prevention  Cardiology will continue to monitor loop recorder for possible atrial fibrillation  Continue to follow up with PCP regarding blood pressure and cholesterol management  Maintain strict control of hypertension with blood pressure goal below 130/90, diabetes with hemoglobin A1c goal below 7.0 % and cholesterol with LDL cholesterol (bad cholesterol) goal below 70 mg/dL.   Signs of a Stroke? Follow the BEFAST method:  Balance Watch for a sudden loss of balance, trouble with coordination or vertigo Eyes Is there a sudden loss of vision in one or both eyes? Or double vision?  Face: Ask the person to smile. Does one side of the face droop or is it numb?  Arms: Ask the person to raise both arms. Does one arm drift downward? Is there weakness or numbness of a leg? Speech: Ask the person to repeat a simple phrase. Does the speech sound slurred/strange? Is the person confused ? Time: If you observe any of these signs, call 911.      Followup in the future with me in 1 year or call earlier if needed       Thank you for coming to see [place] at [facility]. I hope we have been able to provide you high quality care today.  You may receive a patient satisfaction survey over the next few weeks. We would appreciate your feedback and comments so that we may continue to improve ourselves and the health of our patients.

## 2023-02-22 ENCOUNTER — Other Ambulatory Visit: Payer: Self-pay | Admitting: Adult Health

## 2023-02-22 LAB — LIPID PANEL
Chol/HDL Ratio: 3.9 ratio (ref 0.0–4.4)
Cholesterol, Total: 220 mg/dL — ABNORMAL HIGH (ref 100–199)
HDL: 56 mg/dL (ref >39)
LDL Chol Calc (NIH): 123 mg/dL — ABNORMAL HIGH (ref 0–99)
Triglycerides: 234 mg/dL — ABNORMAL HIGH (ref 0–149)
VLDL Cholesterol Cal: 41 mg/dL — ABNORMAL HIGH (ref 5–40)

## 2023-02-22 LAB — COMPREHENSIVE METABOLIC PANEL
ALT: 20 IU/L (ref 0–32)
AST: 18 IU/L (ref 0–40)
Albumin: 4.3 g/dL (ref 3.8–4.9)
Alkaline Phosphatase: 121 IU/L (ref 44–121)
BUN/Creatinine Ratio: 14 (ref 9–23)
BUN: 11 mg/dL (ref 6–24)
Bilirubin Total: 0.5 mg/dL (ref 0.0–1.2)
CO2: 22 mmol/L (ref 20–29)
Calcium: 9.2 mg/dL (ref 8.7–10.2)
Chloride: 105 mmol/L (ref 96–106)
Creatinine, Ser: 0.77 mg/dL (ref 0.57–1.00)
Globulin, Total: 2.9 g/dL (ref 1.5–4.5)
Glucose: 106 mg/dL — ABNORMAL HIGH (ref 70–99)
Potassium: 3.8 mmol/L (ref 3.5–5.2)
Sodium: 142 mmol/L (ref 134–144)
Total Protein: 7.2 g/dL (ref 6.0–8.5)
eGFR: 93 mL/min/{1.73_m2} (ref >59)

## 2023-02-22 MED ORDER — ATORVASTATIN CALCIUM 80 MG PO TABS: 80.0000 mg | ORAL_TABLET | Freq: Every day | ORAL | 11 refills | Status: DC

## 2023-03-02 ENCOUNTER — Ambulatory Visit (INDEPENDENT_AMBULATORY_CARE_PROVIDER_SITE_OTHER): Payer: BC Managed Care – PPO

## 2023-03-02 DIAGNOSIS — I639 Cerebral infarction, unspecified: Secondary | ICD-10-CM

## 2023-03-03 LAB — CUP PACEART REMOTE DEVICE CHECK
Date Time Interrogation Session: 20240828230826
Implantable Pulse Generator Implant Date: 20211116

## 2023-03-10 NOTE — Progress Notes (Signed)
Carelink Summary Report / Loop Recorder 

## 2023-04-04 ENCOUNTER — Ambulatory Visit (INDEPENDENT_AMBULATORY_CARE_PROVIDER_SITE_OTHER): Payer: BC Managed Care – PPO

## 2023-04-04 DIAGNOSIS — I639 Cerebral infarction, unspecified: Secondary | ICD-10-CM | POA: Diagnosis not present

## 2023-04-05 LAB — CUP PACEART REMOTE DEVICE CHECK
Date Time Interrogation Session: 20240930231822
Implantable Pulse Generator Implant Date: 20211116

## 2023-04-18 NOTE — Progress Notes (Signed)
Carelink Summary Report / Loop Recorder 

## 2023-05-09 ENCOUNTER — Ambulatory Visit (INDEPENDENT_AMBULATORY_CARE_PROVIDER_SITE_OTHER): Payer: BC Managed Care – PPO

## 2023-05-09 DIAGNOSIS — I639 Cerebral infarction, unspecified: Secondary | ICD-10-CM

## 2023-05-09 LAB — CUP PACEART REMOTE DEVICE CHECK
Date Time Interrogation Session: 20241102230026
Implantable Pulse Generator Implant Date: 20211116

## 2023-05-30 NOTE — Progress Notes (Signed)
Carelink Summary Report / Loop Recorder 

## 2023-06-13 ENCOUNTER — Ambulatory Visit (INDEPENDENT_AMBULATORY_CARE_PROVIDER_SITE_OTHER): Payer: BC Managed Care – PPO

## 2023-06-13 DIAGNOSIS — I639 Cerebral infarction, unspecified: Secondary | ICD-10-CM | POA: Diagnosis not present

## 2023-06-13 LAB — CUP PACEART REMOTE DEVICE CHECK
Date Time Interrogation Session: 20241208230429
Implantable Pulse Generator Implant Date: 20211116

## 2023-07-18 ENCOUNTER — Ambulatory Visit (INDEPENDENT_AMBULATORY_CARE_PROVIDER_SITE_OTHER): Payer: BC Managed Care – PPO

## 2023-07-18 DIAGNOSIS — I639 Cerebral infarction, unspecified: Secondary | ICD-10-CM | POA: Diagnosis not present

## 2023-07-18 LAB — CUP PACEART REMOTE DEVICE CHECK
Date Time Interrogation Session: 20250112230435
Implantable Pulse Generator Implant Date: 20211116

## 2023-08-22 ENCOUNTER — Ambulatory Visit (INDEPENDENT_AMBULATORY_CARE_PROVIDER_SITE_OTHER): Payer: BC Managed Care – PPO

## 2023-08-22 DIAGNOSIS — I639 Cerebral infarction, unspecified: Secondary | ICD-10-CM | POA: Diagnosis not present

## 2023-08-23 LAB — CUP PACEART REMOTE DEVICE CHECK
Date Time Interrogation Session: 20250216230315
Implantable Pulse Generator Implant Date: 20211116

## 2023-08-24 ENCOUNTER — Encounter: Payer: Self-pay | Admitting: Cardiology

## 2023-08-29 NOTE — Progress Notes (Signed)
 Carelink Summary Report / Loop Recorder

## 2023-09-06 ENCOUNTER — Encounter: Payer: Self-pay | Admitting: *Deleted

## 2023-09-06 ENCOUNTER — Telehealth: Payer: Self-pay | Admitting: Adult Health

## 2023-09-06 ENCOUNTER — Encounter: Payer: Self-pay | Admitting: Adult Health

## 2023-09-06 NOTE — Telephone Encounter (Signed)
 Letter and form completed and Signed by provider and placed at front desk for pick up

## 2023-09-06 NOTE — Telephone Encounter (Addendum)
 Spoke to patient will bring form  to [facility] be filled out and signed by provider

## 2023-09-06 NOTE — Telephone Encounter (Signed)
 Pt aware form and letter are ready for pick up

## 2023-09-06 NOTE — Telephone Encounter (Signed)
 [name] - As discussed this patient wanted a letter to excuse her form [employer]. She is a Nurse, mental health patient and [name] mentioned she would write one.

## 2023-09-06 NOTE — Telephone Encounter (Signed)
 They should have a form to be filled out that was provided by the court to be completed.

## 2023-09-06 NOTE — Telephone Encounter (Signed)
 Does she have a form that we can fill out?

## 2023-09-26 ENCOUNTER — Ambulatory Visit (INDEPENDENT_AMBULATORY_CARE_PROVIDER_SITE_OTHER): Payer: BC Managed Care – PPO

## 2023-09-26 DIAGNOSIS — I639 Cerebral infarction, unspecified: Secondary | ICD-10-CM | POA: Diagnosis not present

## 2023-09-26 LAB — CUP PACEART REMOTE DEVICE CHECK
Date Time Interrogation Session: 20250323230627
Implantable Pulse Generator Implant Date: 20211116

## 2023-09-27 NOTE — Progress Notes (Signed)
 Carelink Summary Report / Loop Recorder

## 2023-09-27 NOTE — Addendum Note (Signed)
 Addended by: Geralyn Flash D on: 09/27/2023 04:44 PM   Modules accepted: Orders

## 2023-10-01 ENCOUNTER — Encounter: Payer: Self-pay | Admitting: Cardiology

## 2023-10-31 ENCOUNTER — Ambulatory Visit (INDEPENDENT_AMBULATORY_CARE_PROVIDER_SITE_OTHER): Payer: BC Managed Care – PPO

## 2023-10-31 ENCOUNTER — Encounter: Payer: Self-pay | Admitting: Cardiology

## 2023-10-31 DIAGNOSIS — I639 Cerebral infarction, unspecified: Secondary | ICD-10-CM

## 2023-10-31 LAB — CUP PACEART REMOTE DEVICE CHECK
Date Time Interrogation Session: 20250427230443
Implantable Pulse Generator Implant Date: 20211116

## 2023-11-11 NOTE — Progress Notes (Signed)
 Carelink Summary Report / Loop Recorder

## 2023-12-05 ENCOUNTER — Ambulatory Visit: Payer: Self-pay | Admitting: Cardiology

## 2023-12-05 ENCOUNTER — Ambulatory Visit (INDEPENDENT_AMBULATORY_CARE_PROVIDER_SITE_OTHER)

## 2023-12-05 DIAGNOSIS — I639 Cerebral infarction, unspecified: Secondary | ICD-10-CM

## 2023-12-05 LAB — CUP PACEART REMOTE DEVICE CHECK
Date Time Interrogation Session: 20250601231238
Implantable Pulse Generator Implant Date: 20211116

## 2023-12-16 NOTE — Progress Notes (Signed)
 Carelink Summary Report / Loop Recorder

## 2024-01-05 ENCOUNTER — Ambulatory Visit (INDEPENDENT_AMBULATORY_CARE_PROVIDER_SITE_OTHER)

## 2024-01-05 DIAGNOSIS — I639 Cerebral infarction, unspecified: Secondary | ICD-10-CM

## 2024-01-05 LAB — CUP PACEART REMOTE DEVICE CHECK
Date Time Interrogation Session: 20250702230740
Implantable Pulse Generator Implant Date: 20211116

## 2024-01-14 ENCOUNTER — Ambulatory Visit: Payer: Self-pay | Admitting: Cardiology

## 2024-01-24 NOTE — Progress Notes (Signed)
 Carelink Summary Report / Loop Recorder

## 2024-02-06 ENCOUNTER — Ambulatory Visit

## 2024-02-06 DIAGNOSIS — I639 Cerebral infarction, unspecified: Secondary | ICD-10-CM | POA: Diagnosis not present

## 2024-02-07 LAB — CUP PACEART REMOTE DEVICE CHECK
Date Time Interrogation Session: 20250802230704
Implantable Pulse Generator Implant Date: 20211116

## 2024-02-08 ENCOUNTER — Ambulatory Visit: Payer: Self-pay | Admitting: Cardiology

## 2024-02-21 ENCOUNTER — Ambulatory Visit (INDEPENDENT_AMBULATORY_CARE_PROVIDER_SITE_OTHER): Payer: BC Managed Care – PPO | Admitting: Adult Health

## 2024-02-21 ENCOUNTER — Encounter: Payer: Self-pay | Admitting: Adult Health

## 2024-02-21 VITALS — BP 127/71 | HR 80 | Ht 64.0 in | Wt 190.0 lb

## 2024-02-21 DIAGNOSIS — R519 Headache, unspecified: Secondary | ICD-10-CM | POA: Diagnosis not present

## 2024-02-21 DIAGNOSIS — I639 Cerebral infarction, unspecified: Secondary | ICD-10-CM | POA: Diagnosis not present

## 2024-02-21 DIAGNOSIS — G43E09 Chronic migraine with aura, not intractable, without status migrainosus: Secondary | ICD-10-CM

## 2024-02-21 DIAGNOSIS — E785 Hyperlipidemia, unspecified: Secondary | ICD-10-CM | POA: Diagnosis not present

## 2024-02-21 MED ORDER — ROSUVASTATIN CALCIUM 20 MG PO TABS
20.0000 mg | ORAL_TABLET | Freq: Every day | ORAL | 11 refills | Status: AC
Start: 2024-02-21 — End: ?

## 2024-02-21 MED ORDER — NURTEC 75 MG PO TBDP: 75.0000 mg | ORAL_TABLET | ORAL | 11 refills | Status: AC | PRN

## 2024-02-21 MED ORDER — CLOPIDOGREL BISULFATE 75 MG PO TABS: 75.0000 mg | ORAL_TABLET | Freq: Every day | ORAL | 3 refills | Status: AC

## 2024-02-21 NOTE — Progress Notes (Signed)
 [facility] [address] [place]. 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 She has noticed increase in headaches. Last time she was here she mentioned that she was having headaches but has noted that they have increased. She states it depends on the month on avg she is having about 15 headache days. She uses the nurtec frequently. It does help knock it out.      HPI:   Update 02/21/2024 JM: [name] returns for yearly follow-up.  Remains stable from stroke standpoint without new stroke/TIA symptoms.  Continued expressive aphasia stable.  She was finally approved for social Security disability.  Reports compliance on Plavix  without side effects. After prior visit, atorvastatin  dosage increased from 40 mg to 80 mg daily due to LDL 123, she has noticed some bilateral arm  achiness since that time.  Loop recorder has not shown atrial fibrillation thus far.  She does report increase in migraine headaches over the past 6 to 7 months, typically about 15 migraines per month. Feels like her typical migraine, located primarily right side, can be occipital, top of head or temporal.  Can occasionally have squiggly lines in left eye prior to migraine. Can be associated with photophobia and phonophobia, no N/V.  Use of Nurtec with benefit, at times will only use half if more of a mild headache. Will occasionally use acetaminophen  as well if more of a mild headache, denies over use.  Denies any other medication changes prior to worsening of migraines besides higher dose of atorvastatin , denies increase stress levels, changes in sleep or diet.    History provided for reference purposes only Update 02/21/2023 JM: Returns for follow-up visit  unaccompanied.  Doing well from stroke standpoint without new stroke/TIA symptoms.  Residual deficits stable.  Remains on long-term disability, in the process of applying for Social Security disability, recently did speech/cognitive testing, waiting to hear results.  Compliant on Plavix  and atorvastatin .  Loop recorder has not shown atrial fibrillation thus far.  Reports continued 4-5 migraine days per month, use of Nurtec with benefit.  Has not had recent follow-up with PCP.  Update 06/21/2022 JM: [name] returns for 73-month stroke follow-up unaccompanied.  Overall stable.  No new stroke/TIA symptoms. Remains on long-term disability. Continuing to apply for social security disability due to residual deficits of aphasia, short-term memory loss, impaired reading comprehension, and difficulty with typing/writing.  All stable since prior visit without any worsening but denies any improvement.  Able to maintain ADLs and majority of IADLs independently.   Occasional migraine headaches about 4 times per month Use of Nurtec with benefit, usually will take half tablet and will take other half if needed after about 45 minutes.   Compliant on Plavix  and atorvastatin  Blood pressure well-controlled Loop recorder has not shown atrial fibrillation thus far  F/u with [facility] in June   Update 12/15/2021 JM: [name] returns for 6 month stroke follow up unaccompanied. Overall stable without new stroke/TIA symptoms.  Reports continued word finding difficulty which has been stable since prior visit, can worsen with excitement, stress or fatigue.  Continued difficulty with typing or writing a word (even simple/basic words), impaired reading comprehension, and short-term memory loss. All  stable since prior visit.  Completed SLP back in January as she has plateaued.  She does continue to do exercises at home as advised by SLP.  Remains on long-term disability and continues to pursue so security disability. Maintains  ADLs and majority of IADLs independently. Has been driving a little more but still primarily only drives short distance. She tries to avoid highway driving.   Compliant on plavix  and atorvastatin , denies side effects Blood pressure today 117/74 Loop recorder has not shown atrial fibrillation thus far - she is interested in having ILR removed  Continues to follow with [facility] - has f/u in July  No further concerns at this time  Update 06/15/2021 JM: Returns for 20-month stroke follow-up unaccompanied   Overall stable -denies new or worsening stroke/TIA symptoms Residual aphasia with gradual improvement since prior visit but reports she was told by SLP she has likely plateaued at this time.  Currently working with SLP 2x/month due to financial reasons. Also reports difficulty with reading and comprehension, difficulty spelling correctly and difficulty with writing and typing.  Remains on long-term disability.  She is currently pursuing Social Security disability.  Able to maintain ADLs independently and able to drive short distances. Prior concerns of worsening aphasia over a 4 hr duration - repeat MR brain no acute/new abnormalities  Compliant on Plavix  and atorvastatin  -denies side effects Blood pressure today 114/67 Loop recorder has not shown atrial fibrillation thus far  Continues to follow with [facility] - f/u visit scheduled 1/6 Completed cardiac cath 04/06/2021 thru [facility] - per pt, unremarkable -unable to view results via epic  No further concerns at this time  Update 02/03/2021 JM: [name] is being seen for 80-month stroke follow-up accompanied by her husband.  She reports having an episode approximately 3 weeks ago with difficulty completing sentences that lasted approximately 4 hours.  She did not seek medical attention at that time but per husband, speech has been hoarse since that time.  Denies any other associated symptoms such as confusion or cognitive changes, visual  changes, weakness/numbness, balance difficulties or headache.  She was seen by [facility] neurology yesterday who placed order for MR brain. She is also being followed by [facility] cardiology and possibly pursuing intracardiac echo for further evaluation of PFO.  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  She is currently pursuing long-term disability and request further assistance.  Continues to work with SLP but notes continued improvement.  Reports compliance on aspirin  and atorvastatin  without associated side effects.  Blood pressure today 109/63.  Loop recorder has not shown atrial fibrillation thus far.  No further concerns at this time.  Update 10/30/2020 JM: [name] returns for stroke follow-up after prior visit with Dr. Rosemarie 4 months ago accompanied by her husband.  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  Recommended aspirin  and Brilinta  for 4 weeks and aspirin  alone.  LDL 23.  A1c 5.4.  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  Discharged home on  09/29/2020 with recommended outpatient PT/SLP.  Continues to work with SLP for residual Broca's aphasia which has been gradually improving.  At times can worsen with increased stress, fatigued or heightened emotions.  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  Denies new stroke/TIA symptoms.  Completed 4 weeks of Brilinta  and has  remained on aspirin  alone without associated side effects Compliant on atorvastatin  20 mg daily without associated side effects Blood pressure today 112/73 Loop recorder has not shown atrial fibrillation thus far  Both husband and [name] are understandably frustrated about continued strokes with unknown cause and lack of further treatment or prevention options.  Discussed Seabron trial but she declines interest and questions second opinion from a different neurologist.  Family/friends recommended Beverley Hurry, MD who is a vascular neurologist at [facility].   No further concerns at this time  Update 07/01/2020 Dr. Rosemarie: [name] returns for follow-up after last visit with Harlene 6 weeks ago.  She was readmitted to [facility] on 06/03/2020 with sudden onset of left arm weakness and difficulty lifting and heaviness.  This happened a few days after she had switched from dual antiplatelet therapy to aspirin  only.  MRI scan showed a right frontal precentral gyrus embolic infarct of cryptogenic etiology.  MRA of the brain and neck showed no large vessel stenosis or occlusion.  She had previously had a cardiac CT in 05/23/2020 which had shown no PFO or shunt and 2D echo and TEE in September which were unremarkable.  Loop interrogation this admission showed no paroxysmal A. fib.  LP was offered but she declined.  Cerebral angiogram was obtained to look for cerebral vasculitis but was negative.  LDL cholesterol was 64 mg percent and hemoglobin A1c was 5.3.  She was restarted on dual antiplatelet therapy aspirin  81 mg and Plavix  75 mg daily and continued deferred to come back to see me today.  She states she is done well.  Her left arm weakness has resolved completely.  She has slightly diminished fine motor skills in the left hand but no real weakness.  She is tolerating Plavix  well without bruising or bleeding as well as Lipitor without muscle aches and pains.  Her blood pressures well controlled today it is  118/69.  She has no new complaints.  She is eating healthy and losing weight  Hospital follow-up 05/20/2020 JM: [name] is being seen for hospital follow-up accompanied by her husband.  She reports initially doing well at discharge but over the past month (approx 1 month post discharge), she has been experiencing word finding difficulties as well as frequent bilateral temporal and occipital headaches.  She does have history of migraines but has not experienced a migraine in many years.  Headache consistent with throbbing sensation but denies photophobia, phonophobia or nausea/vomiting.  Can occur up to 2-3 times monthly and may last for 3 to 4 days.  She will take Tylenol  with some benefit.  She has returned back to work at [employer] dermatology without great difficulty but reports even her colleagues have noticed occasional speech difficulty.  She has remained on both aspirin  and Plavix  without bleeding or bruising as well as atorvastatin  10 mg daily without myalgias.  Further review of hypercoagulable and autoimmune labs pending at discharge unremarkable.  She was evaluated by Dr. Wonda on 05/12/2020 and personally reviewed the office note. Per Dr. Wonda, he reviewed TEE and I did not appreciate any clear  evidence of PFO and TCD negative therefore recommended pursuing cardiac CTA to further assess for cardiac anomalies which is scheduled for this Friday.  He believes increased risk of atrial fibrillation with history of prior heart surgery and continued presence of right atrial dilation and tricuspid regurgitation and referred to EP for further consideration of loop recorder.  She has appointment scheduled today with Dr. Cindie for further discussion of loop recorder.  No further concerns at this time.  Stroke admission 03/11/2020 [name] is a 53 y.o. female with history of hashimoto thyroiditis and ASD closure 30 years ago who presented to [facility] ED on 03/11/2020 for right arm and leg weakness.   Personally reviewed hospitalization pertinent progress notes, lab work and imaging with summary provided.  Evaluated by Dr. Jerri with stroke work-up revealing right parietal occipital region and left frontal lobe small infarcts s/p tPA, infarcts embolic secondary to unknown source concerning for ASD/PFO related as TEE positive for PFO.  Refer to Dr. Wonda for outpatient evaluation of PFO.  Hypercoagulable and autoimmune labs pending at discharge.  Recommended DAPT until evaluation with Dr. Wonda outpatient.  LDL 94 and initiate atorvastatin  20 mg daily. No hx of HTN or DM.  History of Hashimoto thyroiditis not on Synthroid PTA per husband with TSH 6.13 (but FT4 normal), thyroid  peroxidase Ab 55 and thyroglobulin Ab 1.5 and advised follow-up with PCP outpatient for further management.  Her stroke risk factors include hx of ASD closure at age 53 and obesity.  No prior stroke history.  Evaluated by therapies and discharged home in stable condition without therapy needs on 03/14/2020.  Stroke: right parieto-occipital region and left frontal lobe small infarcts, embolic pattern, source uncertain, concerning for ASD/PFO related CT head no acute finding CTA head and neck neg MRI  Brain right parieto-occipital region and left frontal lobe small infartcts MRI C-spine normal cervical spinal cord 2D Echo EF 50-55% TEE unremarkable but positive for PFO TCD bubble study - negative LE venous doppler - negative LDL 94 HgbA1c 5.3 UDS neg hypercoag and autoimmune labs - so far neg, rest pending No indication for LP or heart monitoring at this time - will refer to Dr. Rosemarie for second opinion SCDs for VTE prophylaxis No antithrombotic prior to admission, now on ASA 81 and plavix  75 DAPT for stroke prevention. Continue DAPT until sees Dr. Wonda as outpt. [name] counseled to be compliant with her antithrombotic medications Ongoing aggressive stroke risk factor management Therapy recommendations:   None Disposition:  Discharge to home      ROS:   14 system review of systems performed and negative with exception of those listed in HPI  PMH:  Past Medical History:  Diagnosis Date   Hx of migraines    Stroke (HCC)    06/03/20    PSH:  Past Surgical History:  Procedure Laterality Date   BUBBLE STUDY  03/13/2020   Procedure: BUBBLE STUDY;  Surgeon: Shlomo Wilbert SAUNDERS, MD;  Location: [facility];  Service: Cardiovascular;;   ENDOMETRIAL ABLATION     Uterine/heavy menses   IR ANGIO INTRA EXTRACRAN SEL COM CAROTID INNOMINATE BILAT MOD SED  06/06/2020   IR ANGIO VERTEBRAL SEL VERTEBRAL BILAT MOD SED  06/06/2020   IR CT HEAD LTD  09/25/2020   IR PERCUTANEOUS ART THROMBECTOMY/INFUSION INTRACRANIAL INC DIAG ANGIO  09/25/2020   IR US  GUIDE VASC ACCESS RIGHT  06/06/2020   RADIOLOGY WITH ANESTHESIA N/A 09/25/2020   Procedure: RADIOLOGY WITH ANESTHESIA;  Surgeon: Radiologist, Medication, MD;  Location: [facility] OR;  Service: Radiology;  Laterality: N/A;   SHOULDER SURGERY     Left,/bone spurs   TEE WITHOUT CARDIOVERSION N/A 03/13/2020   Procedure: TRANSESOPHAGEAL ECHOCARDIOGRAM (TEE);  Surgeon: Shlomo Wilbert SAUNDERS, MD;  Location: [facility] ENDOSCOPY;  Service: Cardiovascular;  Laterality: N/A;    Social History:  Social History   Socioeconomic History   Marital status: Married    Spouse name: Not on file   Number of children: Not on file   Years of education: Not on file   Highest education level: Not on file  Occupational History   Occupation: full time  Tobacco Use   Smoking status: Former   Smokeless tobacco: Never  Substance and Sexual Activity   Alcohol use: Yes    Alcohol/week: 1.0 standard drink of alcohol    Types: 1 Cans of beer per week    Comment: states 1 beer every other day or so    Drug use: Never   Sexual activity: Not on file  Other Topics Concern   Not on file  Social History Narrative   Last Mamm: 04.07.10 @ [facility] BI-RADS Category 1: Negative^MM DIGITAL DG BILATERAL   Lives  with husband and 2 daughters   Left Handed   Drinks no caffeine   Social Drivers of Corporate investment banker Strain: Not on file  Food Insecurity: Not on file  Transportation Needs: No Transportation Needs (03/26/2020)   PRAPARE - Administrator, Civil Service (Medical): No    Lack of Transportation (Non-Medical): No  Physical Activity: Not on file  Stress: Not on file  Social Connections: Not on file  Intimate Partner Violence: Not on file    Family History:  Family History  Problem Relation Age of Onset   Hypertension Mother    Hypothyroidism Mother    High Cholesterol Mother    Atrial fibrillation Mother    COPD Father    Emphysema Father    Hypothyroidism Sister     Medications:   Current Outpatient Medications on File Prior to Visit  Medication Sig Dispense Refill   atorvastatin  (LIPITOR) 80 MG tablet Take 1 tablet (80 mg total) by mouth daily. 30 tablet 11   clopidogrel  (PLAVIX ) 75 MG tablet Take 1 tablet (75 mg total) by mouth daily. 90 tablet 3   Rimegepant Sulfate  (NURTEC) 75 MG TBDP Take 1 tablet (75 mg total) by mouth as needed (acute migraine). 16 tablet 11   No current facility-administered medications on file prior to visit.    Allergies:   Allergies  Allergen Reactions   Bee Venom Swelling      OBJECTIVE:  Physical Exam  Vitals:   02/21/24 1040  Weight: 190 lb (86.2 kg)  Height: 5' 4 (1.626 m)   Body mass index is 32.61 kg/m. No results found.  General: well developed, well nourished, pleasant middle-age Caucasian female, seated, in no evident distress  Neurologic Exam Mental Status: Awake and fully alert. Moderate expressive aphasia with fragmented speech and hesitancy. No evidence of dysarthria or receptive aphasia.  Able to speak in short sentences.  Able to follow simple commands without difficulty.  Oriented to place and time. Recent memory impaired and remote memory intact. Attention span, concentration and fund of  knowledge appropriate during visit. Mood and affect appropriate.  Cranial Nerves: Pupils equal, briskly reactive to light. Extraocular movements full without nystagmus. Visual fields full to confrontation. Hearing intact. Facial sensation intact.  Face, tongue and palate moves normally and symmetrically.  Motor: Normal bulk and tone. Normal strength in all tested extremity muscles. Sensory.: intact to touch , pinprick , position and vibratory sensation.  Coordination: Rapid alternating movements normal in all extremities. Finger-to-nose and heel-to-shin performed accurately bilaterally. Gait and Station: Arises from chair without difficulty. Stance is normal. Gait demonstrates normal stride length and balance without use of assistive device.   Reflexes: 1+ and symmetric. Toes downgoing.         ASSESSMENT: CHARM STENNER is a 53 y.o. 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  Vascular risk factors include history of ASD and s/p closure 30 years ago, HLD,  thyroiditis, former tobacco use and history of menstrual migraines. Hx of chronic migraine headaches with increased frequency over the past 6 to 7 months.    PLAN:  Recurrent ESUS: Residual deficits: Moderate expressive aphasia, reading/writing difficulty and mild short-term memory difficulties.  Now on Social Security disability Continue Plavix  for secondary stroke prevention.  Refills provided.  Recommend switching from atorvastatin  80mg  daily to Crestor  20mg  daily due to myalgias. Will repeat lipid panel today.  Loop recorder has not shown atrial fibrillation thus far -monitored by cardiology Prior work-up: TCD negative.  TEE no significant IA shunt. Cardiac cath unremarkable.  Cardiac CT no CAD, PFO or IA shunt.  Cerebral angiogram negative for vasculitis.  Hypercoagulable and autoimmune labs unremarkable Discussed  secondary stroke prevention measures and advised to schedule follow-up visit PCP for aggressive stroke risk factor management including HLD with LDL goal<70.   Chronic migraines:  Worsening headaches:  Worsening over the past 6-7 months with about 15 migraines per month Question if due to higher dose of atorvastatin , will switch to Crestor  as noted above Will check lab work today including CMP, TSH, CRP and ESR Per [name] request, will hold off on initiating prophylactic therapy to see if headaches improve after switching statin therapies.  She was advised to call after 2 to 3 weeks if migraines persist  Continue Nurtec as needed for migraine rescue, triptans contraindicated due to stroke history Denies overuse of analgesic medications Do not feel repeat imaging needed at this time, no change to chronic migraine characteristics, neuro exam intact. Can consider MRI brain if headaches persist, become more frequent or any changes to characteristics/features Also discussed supplements such as magnesium oxide, riboflavin and/or co-Q10 for further migraine prevention Discussed importance of keeping a migraine diary and potentially identify migraine triggers     Follow up in 1 year or call earlier if needed    CC:  Tower, Laine LABOR, MD    I personally spent a total of 45 minutes in the care of the [name] today including preparing to see the [name], performing a medically appropriate exam/evaluation, counseling and educating, placing orders, and documenting clinical information in the EHR.   Harlene Bogaert, AGNP-BC  [facility] [address] [place], [place] [address]  Phone [phone] Fax [phone] Note: This document was prepared with digital dictation and possible smart phrase technology. Any transcriptional errors that result from this process are unintentional.

## 2024-02-21 NOTE — Patient Instructions (Addendum)
 Your Plan:  Will switch atorvastatin  to Crestor  20mg  daily - please let me know if cramping continues after 1-2 weeks   Will check lab work today - you will be notified via mychart regarding results  If migraines persist after change in statin therapy, please let me know so we can discuss preventative therapy   You can also try magnesium oxide 400-500 mg daily, riboflavin (B2) 200 mg twice daily and/or co-Q10 300 mg daily for further migraine prevention  Please start keeping track of your migraine headaches, this can also help identify possible triggers such as increased stress, lack of sleep, changes in barometric pressure, certain foods or lack of, etc.   Continue Nurtec as needed for migraine prevention      Follow up in 6 months or call earlier if needed     Thank you for coming to see us  at [facility]. I hope we have been able to provide you high quality care today.  You may receive a patient satisfaction survey over the next few weeks. We would appreciate your feedback and comments so that we may continue to improve ourselves and the health of our patients.  GENERAL HEADACHE INSTRUCTIONS Headache Preventive Treatment: Please keep in mind that it takes 4-6 weeks for the medication to start working well and 2-3 months at the appropriate dose before deciding if it will be useful or not. If it is not helping at all by this time, then we will discuss other medications to try. Supplements may take 3-6 months until you see full effect.    Natural supplements: Magnesium Oxide or Magnesium Glycinate 500 mg at bed (up to 800 mg daily) Coenzyme Q10 300 mg in AM Vitamin B2- 200 mg twice a day   Add 1 supplement at a time since even natural supplements can have undesirable side effects. You can sometimes buy supplements cheaper (especially Coenzyme Q10) at www.WebmailGuide.co.za or at [employer].   Vitamins and herbs that show potential:   Magnesium: Magnesium (250 mg twice a  day or 500 mg at bed) has a relaxant effect on smooth muscles such as blood vessels. Individuals suffering from frequent or daily headache usually have low magnesium levels which can be increase with daily supplementation of 400-750 mg. Three trials found 40-90% average headache reduction  when used as a preventative. Magnesium also demonstrated the benefit in menstrually related migraine.  Magnesium is part of the messenger system in the serotonin cascade and it is a good muscle relaxant.  It is also useful for constipation which can be a side effect of other medications used to treat migraine. Good sources include nuts, whole grains, and tomatoes. Side Effects: loose stool/diarrhea Riboflavin (vitamin B 2) 200 mg twice a day. This vitamin assists nerve cells in the production of ATP a principal energy storing molecule.  It is necessary for many chemical reactions in the body.  There have been at least 3 clinical trials of riboflavin using 400 mg per day all of which suggested that migraine frequency can be decreased.  All 3 trials showed significant improvement in over half of migraine sufferers.  The supplement is found in bread, cereal, milk, meat, and poultry.  Most Americans get more riboflavin than the recommended daily allowance, however riboflavin deficiency is not necessary for the supplements to help prevent headache. Side effects: energizing, green urine   Coenzyme Q10: This is present in almost all cells in the body and is critical component for the conversion of energy.  Recent  studies have shown that a nutritional supplement of CoQ10 can reduce the frequency of migraine attacks by improving the energy production of cells as with riboflavin.  Doses of 150 mg twice a day have been shown to be effective.   Melatonin: Increasing evidence shows correlation between melatonin secretion and headache conditions.  Melatonin supplementation has decreased headache intensity and duration.  It is widely used  as a sleep aid.  Sleep is natures way of dealing with migraine.  A dose of 3 mg is recommended to start for headaches including cluster headache. Higher doses up to 15 mg has been reviewed for use in Cluster headache and have been used. The rationale behind using melatonin for cluster is that many theories regarding the cause of Cluster headache center around the disruption of the normal circadian rhythm in the brain.  This helps restore the normal circadian rhythm.   Ginger: Ginger has a small amount of antihistamine and anti-inflammatory action which may help headache.  It is primarily used for nausea and may aid in the absorption of other medications. HEADACHE DIET: Foods and beverages which may trigger migraine Note that only 20% of headache patients are food sensitive. You will know if you are food sensitive if you get a headache consistently 20 minutes to 2 hours after eating a certain food. Only cut out a food if it causes headaches, otherwise you might remove foods you enjoy! What matters most for diet is to eat a well balanced healthy diet full of vegetables and low fat protein, and to not miss meals.   Chocolate, other sweets ALL cheeses except cottage and cream cheese Dairy products, yogurt, sour cream, ice cream Liver Meat extracts (Bovril, Marmite, meat tenderizers) Meats or fish which have undergone aging, fermenting, pickling or smoking. These include: Hotdogs,salami,Lox,sausage, mortadellas,smoked salmon, pepperoni, Pickled herring Pods of broad bean (English beans, Chinese pea pods, [place] (fava) beans, lima and navy beans Ripe avocado, ripe banana Yeast extracts or active yeast preparations such as Brewer's or Fleishman's (commercial bakes goods are permitted) Tomato based foods, pizza (lasagna, etc.)   MSG (monosodium glutamate) is disguised as many things; look for these common aliases: Monopotassium glutamate Autolysed yeast Hydrolysed protein Sodium  caseinate "flavorings" "all natural preservatives Nutrasweet   Avoid all other foods that convincingly provoke headaches.   Resources: The Dizzy Bluford Aid Your Headache Diet, migrainestrong.com  https://zamora-andrews.com/   Caffeine and Migraine For patients that have migraine, caffeine intake more than 3 days per week can lead to dependency and increased migraine frequency. I would recommend cutting back on your caffeine intake as best you can. The recommended amount of caffeine is 200-300 mg daily, although migraine patients may experience dependency at even lower doses. While you may notice an increase in headache temporarily, cutting back will be helpful for headaches in the long run. For more information on caffeine and migraine, visit: https://americanmigrainefoundation.org/resource-library/caffeine-and-migraine/   Headache Prevention Strategies:   1. Maintain a headache diary; learn to identify and avoid triggers.  - This can be a simple note where you log when you had a headache, associated symptoms, and medications used - There are several smartphone apps developed to help track migraines: Migraine Buddy, Migraine Monitor, Curelator N1-Headache App   Common triggers include: Emotional triggers: Emotional/Upset family or friends Emotional/Upset occupation Business reversal/success Anticipation anxiety Crisis-serious Post-crisis periodNew job/position   Physical triggers: Vacation Day Weekend Strenuous Exercise High Altitude Location New Move Menstrual Day Physical Illness Oversleep/Not enough sleep Weather changes Light: Photophobia or light sesnitivity  treatment involves a balance between desensitization and reduction in overly strong input. Use dark polarized glasses outside, but not inside. Avoid bright or fluorescent light, but do not dim environment to the point that going into a normally lit room hurts. Consider  FL-41 tint lenses, which reduce the most irritating wavelengths without blocking too much light.  These can be obtained at axonoptics.com or theraspecs.com Foods: see list above.   2. Limit use of acute treatments (over-the-counter medications, triptans, etc.) to no more than 2 days per week or 10 days per month to prevent medication overuse headache (rebound headache).     3. Follow a regular schedule (including weekends and holidays): Don't skip meals. Eat a balanced diet. 8 hours of sleep nightly. Minimize stress. Exercise 30 minutes per day. Being overweight is associated with a 5 times increased risk of chronic migraine. Keep well hydrated and drink 6-8 glasses of water per day.   4. Initiate non-pharmacologic measures at the earliest onset of your headache. Rest and quiet environment. Relax and reduce stress. Breathe2Relax is a free app that can instruct you on    some simple relaxtion and breathing techniques. Http://Dawnbuse.com is a    free website that provides teaching videos on relaxation.  Also, there are  many apps that   can be downloaded for "mindful" relaxation.  An app called YOGA NIDRA will help walk you through mindfulness. Another app called Calm can be downloaded to give you a structured mindfulness guide with daily reminders and skill development. Headspace for guided meditation Mindfulness Based Stress Reduction Online Course: www.palousemindfulness.com Cold compresses.   5. Don't wait!! Take the maximum allowable dosage of prescribed medication at the first sign of migraine.   6. Compliance:  Take prescribed medication regularly as directed and at the first sign of a migraine.   7. Communicate:  Call your physician when problems arise, especially if your headaches change, increase in frequency/severity, or become associated with neurological symptoms (weakness, numbness, slurred speech, etc.).   8. Headache/pain management therapies: Consider various complementary  methods, including medication, behavioral therapy, psychological counselling, biofeedback, massage therapy, acupuncture, dry needling, and other modalities.  Such measures may reduce the need for medications. Counseling for pain management, where patients learn to function and ignore/minimize their pain, seems to work very well.   9. Recommend changing family's attention and focus away from patient's headaches. Instead, emphasize daily activities. If first question of day is 'How are your headaches/Do you have a headache today?', then patient will constantly think about headaches, thus making them worse. Goal is to re-direct attention away from headaches, toward daily activities and other distractions.   10. Helpful Websites: www.AmericanHeadacheSociety.org PatentHood.ch www.headaches.org TightMarket.nl www.achenet.org

## 2024-02-22 ENCOUNTER — Ambulatory Visit: Payer: Self-pay | Admitting: Adult Health

## 2024-02-22 ENCOUNTER — Encounter: Payer: Self-pay | Admitting: Adult Health

## 2024-02-22 NOTE — Telephone Encounter (Signed)
Responded via results.

## 2024-02-23 LAB — COMPREHENSIVE METABOLIC PANEL WITH GFR
ALT: 17 IU/L (ref 0–32)
AST: 15 IU/L (ref 0–40)
Albumin: 4.4 g/dL (ref 3.8–4.9)
Alkaline Phosphatase: 94 IU/L (ref 44–121)
BUN/Creatinine Ratio: 21 (ref 9–23)
BUN: 15 mg/dL (ref 6–24)
Bilirubin Total: 0.9 mg/dL (ref 0.0–1.2)
CO2: 20 mmol/L (ref 20–29)
Calcium: 9.4 mg/dL (ref 8.7–10.2)
Chloride: 104 mmol/L (ref 96–106)
Creatinine, Ser: 0.71 mg/dL (ref 0.57–1.00)
Globulin, Total: 2.6 g/dL (ref 1.5–4.5)
Glucose: 100 mg/dL — ABNORMAL HIGH (ref 70–99)
Potassium: 4.1 mmol/L (ref 3.5–5.2)
Sodium: 140 mmol/L (ref 134–144)
Total Protein: 7 g/dL (ref 6.0–8.5)
eGFR: 102 mL/min/1.73 (ref >59)

## 2024-02-23 LAB — LIPID PANEL
Chol/HDL Ratio: 3.1 ratio (ref 0.0–4.4)
Cholesterol, Total: 178 mg/dL (ref 100–199)
HDL: 58 mg/dL (ref >39)
LDL Chol Calc (NIH): 104 mg/dL — ABNORMAL HIGH (ref 0–99)
Triglycerides: 88 mg/dL (ref 0–149)
VLDL Cholesterol Cal: 16 mg/dL (ref 5–40)

## 2024-02-23 LAB — SEDIMENTATION RATE: Sed Rate: 14 mm/h (ref 0–40)

## 2024-02-23 LAB — C-REACTIVE PROTEIN: CRP: 2 mg/L (ref 0–10)

## 2024-02-23 LAB — TSH: TSH: 3.21 u[IU]/mL (ref 0.450–4.500)

## 2024-02-28 ENCOUNTER — Other Ambulatory Visit: Payer: Self-pay | Admitting: Endocrinology

## 2024-02-28 DIAGNOSIS — E049 Nontoxic goiter, unspecified: Secondary | ICD-10-CM

## 2024-02-29 ENCOUNTER — Ambulatory Visit
Admission: RE | Admit: 2024-02-29 | Discharge: 2024-02-29 | Disposition: A | Discharge status: Home / Self Care | Source: Ambulatory Visit | Attending: Endocrinology | Admitting: Endocrinology

## 2024-02-29 DIAGNOSIS — E049 Nontoxic goiter, unspecified: Secondary | ICD-10-CM

## 2024-03-08 ENCOUNTER — Ambulatory Visit (INDEPENDENT_AMBULATORY_CARE_PROVIDER_SITE_OTHER)

## 2024-03-08 DIAGNOSIS — I639 Cerebral infarction, unspecified: Secondary | ICD-10-CM

## 2024-03-08 LAB — CUP PACEART REMOTE DEVICE CHECK
Date Time Interrogation Session: 20250903230638
Implantable Pulse Generator Implant Date: 20211116

## 2024-03-10 ENCOUNTER — Ambulatory Visit: Payer: Self-pay | Admitting: Cardiology

## 2024-03-17 NOTE — Progress Notes (Signed)
 Remote Loop Recorder Transmission

## 2024-04-02 NOTE — Progress Notes (Signed)
 Remote Loop Recorder Transmission

## 2024-04-09 ENCOUNTER — Encounter

## 2024-04-11 ENCOUNTER — Ambulatory Visit (INDEPENDENT_AMBULATORY_CARE_PROVIDER_SITE_OTHER)

## 2024-04-11 DIAGNOSIS — I639 Cerebral infarction, unspecified: Secondary | ICD-10-CM

## 2024-04-12 LAB — CUP PACEART REMOTE DEVICE CHECK
Date Time Interrogation Session: 20251007230247
Implantable Pulse Generator Implant Date: 20211116

## 2024-04-12 NOTE — Progress Notes (Signed)
 Remote Loop Recorder Transmission

## 2024-04-16 ENCOUNTER — Ambulatory Visit: Payer: Self-pay | Admitting: Cardiology

## 2024-04-16 NOTE — Progress Notes (Signed)
 Remote Loop Recorder Transmission

## 2024-05-10 ENCOUNTER — Encounter

## 2024-05-12 ENCOUNTER — Ambulatory Visit (INDEPENDENT_AMBULATORY_CARE_PROVIDER_SITE_OTHER)

## 2024-05-12 DIAGNOSIS — I639 Cerebral infarction, unspecified: Secondary | ICD-10-CM | POA: Diagnosis not present

## 2024-05-13 LAB — CUP PACEART REMOTE DEVICE CHECK
Date Time Interrogation Session: 20251107230935
Implantable Pulse Generator Implant Date: 20211116

## 2024-05-14 ENCOUNTER — Ambulatory Visit: Payer: Self-pay | Admitting: Cardiology

## 2024-05-16 NOTE — Progress Notes (Signed)
 Remote Loop Recorder Transmission

## 2024-06-05 ENCOUNTER — Encounter: Payer: Self-pay | Admitting: Adult Health

## 2024-06-11 ENCOUNTER — Encounter

## 2024-06-12 ENCOUNTER — Ambulatory Visit (INDEPENDENT_AMBULATORY_CARE_PROVIDER_SITE_OTHER)

## 2024-06-13 ENCOUNTER — Ambulatory Visit: Payer: Self-pay | Admitting: Cardiology

## 2024-06-13 LAB — CUP PACEART REMOTE DEVICE CHECK
Date Time Interrogation Session: 20251208230457
Implantable Pulse Generator Implant Date: 20211116

## 2024-06-13 NOTE — Progress Notes (Signed)
 Remote Loop Recorder Transmission

## 2024-07-12 ENCOUNTER — Telehealth: Payer: Self-pay | Admitting: *Deleted

## 2024-07-12 ENCOUNTER — Encounter

## 2024-07-12 ENCOUNTER — Encounter: Payer: Self-pay | Admitting: *Deleted

## 2024-07-12 NOTE — Telephone Encounter (Signed)
 Received [employer] Disability form for pt.  Last one done 05-2022.

## 2024-07-13 ENCOUNTER — Ambulatory Visit

## 2024-07-13 DIAGNOSIS — I639 Cerebral infarction, unspecified: Secondary | ICD-10-CM

## 2024-07-15 ENCOUNTER — Ambulatory Visit: Payer: Self-pay | Admitting: Cardiology

## 2024-07-15 LAB — CUP PACEART REMOTE DEVICE CHECK
Date Time Interrogation Session: 20260108230626
Implantable Pulse Generator Implant Date: 20211116

## 2024-07-16 NOTE — Telephone Encounter (Signed)
Signed form.  Thank you! 

## 2024-07-16 NOTE — Telephone Encounter (Signed)
 Form completed, to Harlene, NP to review, complete and sign.

## 2024-07-17 NOTE — Progress Notes (Signed)
 Remote Loop Recorder Transmission

## 2024-07-18 NOTE — Telephone Encounter (Signed)
 To medical records.

## 2024-08-13 ENCOUNTER — Encounter

## 2024-09-11 ENCOUNTER — Ambulatory Visit: Admitting: Adult Health

## 2024-09-13 ENCOUNTER — Encounter

## 2024-10-14 ENCOUNTER — Encounter

## 2024-10-15 ENCOUNTER — Encounter

## 2024-11-15 ENCOUNTER — Encounter

## 2024-12-17 ENCOUNTER — Encounter
